# Patient Record
Sex: Female | Born: 1965 | Race: Black or African American | Hispanic: No | Smoking: Light tobacco smoker
Health system: Southern US, Community
[De-identification: ages and names within clinical notes are randomized; demographics above are authoritative.]

## PROBLEM LIST (undated history)

## (undated) ENCOUNTER — Emergency Department: Admission: EM | Payer: No Typology Code available for payment source | Source: Home / Self Care

## (undated) DIAGNOSIS — G8929 Other chronic pain: Secondary | ICD-10-CM

## (undated) DIAGNOSIS — F419 Anxiety disorder, unspecified: Secondary | ICD-10-CM

## (undated) DIAGNOSIS — F32A Depression, unspecified: Secondary | ICD-10-CM

## (undated) DIAGNOSIS — F329 Major depressive disorder, single episode, unspecified: Secondary | ICD-10-CM

## (undated) DIAGNOSIS — M549 Dorsalgia, unspecified: Secondary | ICD-10-CM

## (undated) HISTORY — DX: Depression, unspecified: F32.A

## (undated) HISTORY — DX: Anxiety disorder, unspecified: F41.9

## (undated) HISTORY — DX: Major depressive disorder, single episode, unspecified: F32.9

## (undated) HISTORY — PX: LIPOMA EXCISION: SHX5283

---

## 2007-09-26 ENCOUNTER — Emergency Department: Payer: Self-pay | Admitting: Emergency Medicine

## 2010-02-01 ENCOUNTER — Ambulatory Visit: Payer: Self-pay | Admitting: Family Medicine

## 2010-06-21 ENCOUNTER — Ambulatory Visit: Payer: Self-pay | Admitting: Family Medicine

## 2013-02-08 ENCOUNTER — Ambulatory Visit: Payer: Self-pay

## 2014-03-16 ENCOUNTER — Ambulatory Visit: Payer: Self-pay | Admitting: Obstetrics and Gynecology

## 2014-04-09 ENCOUNTER — Emergency Department: Payer: Self-pay | Admitting: Internal Medicine

## 2014-04-16 ENCOUNTER — Emergency Department: Payer: Self-pay | Admitting: Emergency Medicine

## 2014-05-02 ENCOUNTER — Emergency Department: Payer: Self-pay | Admitting: Student

## 2014-07-19 ENCOUNTER — Ambulatory Visit: Payer: Medicaid Other | Attending: Orthopedic Surgery | Admitting: Physical Therapy

## 2014-07-19 ENCOUNTER — Encounter: Payer: Self-pay | Admitting: Physical Therapy

## 2014-07-19 DIAGNOSIS — M5431 Sciatica, right side: Secondary | ICD-10-CM

## 2014-07-19 DIAGNOSIS — M6281 Muscle weakness (generalized): Secondary | ICD-10-CM
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
## 2014-08-23 ENCOUNTER — Encounter: Payer: Self-pay | Admitting: Emergency Medicine

## 2014-08-23 ENCOUNTER — Emergency Department
Admission: EM | Admit: 2014-08-23 | Discharge: 2014-08-23 | Disposition: A | Discharge status: Home / Self Care | Payer: Medicaid Other | Attending: Emergency Medicine | Admitting: Emergency Medicine

## 2014-08-23 DIAGNOSIS — G8929 Other chronic pain: Secondary | ICD-10-CM

## 2014-08-23 DIAGNOSIS — M79604 Pain in right leg: Secondary | ICD-10-CM | POA: Insufficient documentation

## 2014-08-23 DIAGNOSIS — Z720 Tobacco use: Secondary | ICD-10-CM | POA: Insufficient documentation

## 2014-08-23 DIAGNOSIS — M549 Dorsalgia, unspecified: Secondary | ICD-10-CM | POA: Insufficient documentation

## 2014-08-23 HISTORY — DX: Other chronic pain: G89.29

## 2014-08-23 HISTORY — DX: Dorsalgia, unspecified: M54.9

## 2014-08-23 MED ORDER — ORPHENADRINE CITRATE 30 MG/ML IJ SOLN
INTRAMUSCULAR | Status: AC
  Administered 2014-08-23: 60 mg
  Filled 2014-08-23: qty 2

## 2014-08-23 MED ORDER — HYDROMORPHONE HCL 1 MG/ML IJ SOLN: 1.0000 mg | Freq: Once | INTRAMUSCULAR | Status: AC

## 2014-08-23 MED ORDER — DEXAMETHASONE SODIUM PHOSPHATE 10 MG/ML IJ SOLN: 10.0000 mg | Freq: Once | INTRAMUSCULAR | Status: AC

## 2014-08-23 MED ORDER — HYDROMORPHONE HCL 1 MG/ML IJ SOLN
INTRAMUSCULAR | Status: AC
  Administered 2014-08-23: 1 mg
  Filled 2014-08-23: qty 1

## 2014-08-23 MED ORDER — ORPHENADRINE CITRATE 30 MG/ML IJ SOLN: 60.0000 mg | Freq: Two times a day (BID) | INTRAMUSCULAR | Status: DC

## 2014-08-23 MED ORDER — DEXAMETHASONE SODIUM PHOSPHATE 10 MG/ML IJ SOLN
INTRAMUSCULAR | Status: AC
  Administered 2014-08-23: 10 mg
  Filled 2014-08-23: qty 1

## 2014-08-23 NOTE — ED Notes (Signed)
Pt to ed with c/o back pain and right leg pain.  Pt states she has been seeing todd mundy for help but he is out of town.  Reports next md appt is on July 7.  Pt states she has been on percocet in the past and it helps.  Pt reports his office called in norco on June 16th but it does not help as much with the pain.

## 2014-08-23 NOTE — ED Provider Notes (Signed)
[facility] Emergency Department Provider Note  ____________________________________________  Time seen: Approximately 11:25 AM  I have reviewed the triage vital signs and the nursing notes.   HISTORY  Chief Complaint Back Pain and Leg Pain    HPI DORSIE SETHI is a 49 y.o. female the patient requests pain medication for chronic back pain with radicular component to the right leg. Patient states her PCP is out of town patient and next appointment is 09/01/2014. Patient states she is out of Percocets which helps her pain. Patient states she contacted her PCP office on June 16 and prescribed Norco does not help. Patient is requesting a refill of Percocet as the to last until 09/01/2014.  Patient describes the pain as sharp and running a right leg and rates it as a 9/10. Patient denies any bladder or bowel dysfunction. States the only palliative measure is the Percocets.  Past Medical History  Diagnosis Date  . Chronic back pain     There are no active problems to display for this patient.   History reviewed. No pertinent past surgical history.  Current Outpatient Rx  Name  Route  Sig  Dispense  Refill  . ibuprofen (ADVIL,MOTRIN) 200 MG tablet   Oral   Take 200 mg by mouth every 6 (six) hours as needed.         Marland Kitchen oxyCODONE-acetaminophen (PERCOCET/ROXICET) 5-325 MG per tablet   Oral   Take by mouth every 4 (four) hours as needed for severe pain.           Allergies Penicillins  History reviewed. No pertinent family history.  Social History History  Substance Use Topics  . Smoking status: Light Tobacco Smoker  . Smokeless tobacco: Not on file  . Alcohol Use: No    Review of Systems Constitutional: No fever/chills Eyes: No visual changes. ENT: No sore throat. Cardiovascular: Denies chest pain. Respiratory: Denies shortness of breath. Gastrointestinal: No abdominal pain.  No nausea, no vomiting.  No diarrhea.  No  constipation. Genitourinary: Negative for dysuria. Musculoskeletal: Chronic radicular back pain. Skin: Negative for rash. Neurological: Negative for headaches, focal weakness or numbness. Allergic/Immunilogical: See medication list 10-point ROS otherwise negative.  ____________________________________________   PHYSICAL EXAM:  VITAL SIGNS: ED Triage Vitals  Enc Vitals Group     BP 08/23/14 1100 133/83 mmHg     Pulse Rate 08/23/14 1100 79     Resp 08/23/14 1100 20     Temp 08/23/14 1100 98.2 F (36.8 C)     Temp Source 08/23/14 1100 Oral     SpO2 08/23/14 1100 99 %     Weight 08/23/14 1100 220 lb (99.791 kg)     Height 08/23/14 1100 5\' 6"  (1.676 m)     Head Cir --      Peak Flow --      Pain Score 08/23/14 1107 9     Pain Loc --      Pain Edu? --      Excl. in [place]? --    Constitutional: Alert and oriented. Mild distress. Eyes: Conjunctivae are normal. PERRL. EOMI. Head: Atraumatic. Nose: No congestion/rhinnorhea. Mouth/Throat: Mucous membranes are moist.  Oropharynx non-erythematous. Neck: No stridor. Full nuchal range of motion nontender palpation. Hematological/Lymphatic/Immunilogical: No cervical lymphadenopathy. Cardiovascular: Normal rate, regular rhythm. Grossly normal heart sounds.  Good peripheral circulation. Respiratory: Normal respiratory effort.  No retractions. Lungs CTAB. Gastrointestinal: Soft and nontender. No distention. No abdominal bruits. No CVA tenderness. Musculoskeletal: No spinal deformity patient maintain a  slightly flexed position. Patient relates a hesitant gait. Patient has guarding at L3-S1. Patient sits and stands her last upper extremity. There was a negative straight leg test. Neurologic:  Normal speech and language. No gross focal neurologic deficits are appreciated. Speech is normal. No gait instability. Skin:  Skin is warm, dry and intact. No rash noted. Psychiatric: Mood and affect are normal. Speech and behavior are  normal.  ____________________________________________   LABS (all labs ordered are listed, but only abnormal results are displayed)  Labs Reviewed - No data to display ____________________________________________  EKG   ____________________________________________  RADIOLOGY   ____________________________________________   PROCEDURES  Procedure(s) performed: None  Critical Care performed: No  ____________________________________________   INITIAL IMPRESSION / ASSESSMENT AND PLAN / ED COURSE  Pertinent labs & imaging results that were available during my care of the patient were reviewed by me and considered in my medical decision making (see chart for details).  Chronic back pain. Discussed patient prescribing usage of narcotic medication in this past month. Advised the ER does not prescribe narcotics for chronic back pain. Advised patient we will give her an injection of Dilaudid, Norflex, and Decadron. Advised patient to follow with the clinic treating her for chronic back pain in the absence of her PCP may be another provider can address her needs. ____________________________________________   FINAL CLINICAL IMPRESSION(S) / ED DIAGNOSES  Final diagnoses:  Chronic back pain greater than 3 months duration      Sable Feil, PA-C 08/23/14 1150  Ahmed Prima, MD 08/23/14 [phone]

## 2014-08-23 NOTE — ED Notes (Signed)
C/o chronic right hip pain down right leg

## 2014-10-07 DIAGNOSIS — M5442 Lumbago with sciatica, left side: Secondary | ICD-10-CM | POA: Insufficient documentation

## 2014-10-07 DIAGNOSIS — M5441 Lumbago with sciatica, right side: Secondary | ICD-10-CM | POA: Insufficient documentation

## 2015-01-17 ENCOUNTER — Encounter: Payer: Self-pay | Admitting: Emergency Medicine

## 2015-01-17 ENCOUNTER — Emergency Department
Admission: EM | Admit: 2015-01-17 | Discharge: 2015-01-17 | Disposition: A | Discharge status: Home / Self Care | Payer: Medicaid Other | Attending: Emergency Medicine | Admitting: Emergency Medicine

## 2015-01-17 DIAGNOSIS — M5441 Lumbago with sciatica, right side: Secondary | ICD-10-CM | POA: Diagnosis not present

## 2015-01-17 DIAGNOSIS — F17200 Nicotine dependence, unspecified, uncomplicated: Secondary | ICD-10-CM | POA: Insufficient documentation

## 2015-01-17 DIAGNOSIS — Z880 Allergy status to penicillin: Secondary | ICD-10-CM | POA: Insufficient documentation

## 2015-01-17 DIAGNOSIS — G8929 Other chronic pain: Secondary | ICD-10-CM | POA: Diagnosis not present

## 2015-01-17 DIAGNOSIS — Z79899 Other long term (current) drug therapy: Secondary | ICD-10-CM | POA: Diagnosis not present

## 2015-01-17 DIAGNOSIS — M545 Low back pain: Secondary | ICD-10-CM | POA: Diagnosis present

## 2015-01-17 DIAGNOSIS — M5431 Sciatica, right side: Secondary | ICD-10-CM

## 2015-01-17 MED ORDER — PREDNISONE 10 MG (21) PO TBPK: ORAL_TABLET | ORAL | Status: DC

## 2015-01-17 MED ORDER — HYDROCODONE-ACETAMINOPHEN 5-325 MG PO TABS
1.0000 | ORAL_TABLET | ORAL | Status: DC | PRN
Start: 2015-01-17 — End: 2016-06-11

## 2015-01-17 NOTE — ED Provider Notes (Signed)
[facility] Emergency Department Provider Note ____________________________________________  Time seen: Approximately 10:24 AM  I have reviewed the triage vital signs and the nursing notes.   HISTORY  Chief Complaint Back Pain   HPI Hannah Maynard is a 49 y.o. female who presents to the emergency department for evaluation of chronic back pain. She states that she's had back pain with sciatica for a long time. She is currently being seen at the pain clinic and they have her taking Lyrica after the gabapentin did not work. She states that the Lyrica does not seem to be working either. She states that now she feels like her "skin hurts." She states that the pain is typical with the exception of the change in the feeling of her skin on the right lateral leg. She denies new injury.   Past Medical History  Diagnosis Date  . Chronic back pain     There are no active problems to display for this patient.   History reviewed. No pertinent past surgical history.  Current Outpatient Rx  Name  Route  Sig  Dispense  Refill  . pregabalin (LYRICA) 75 MG capsule   Oral   Take 75 mg by mouth 2 (two) times daily.         Marland Kitchen HYDROcodone-acetaminophen (NORCO/VICODIN) 5-325 MG tablet   Oral   Take 1 tablet by mouth every 4 (four) hours as needed.   9 tablet   0   . ibuprofen (ADVIL,MOTRIN) 200 MG tablet   Oral   Take 200 mg by mouth every 6 (six) hours as needed.         Marland Kitchen oxyCODONE-acetaminophen (PERCOCET/ROXICET) 5-325 MG per tablet   Oral   Take by mouth every 4 (four) hours as needed for severe pain.         . predniSONE (STERAPRED UNI-PAK 21 TAB) 10 MG (21) TBPK tablet      Take 6 tablets on day 1 Take 5 tablets on day 2 Take 4 tablets on day 3 Take 3 tablets on day 4 Take 2 tablets on day 5 Take 1 tablet on day 6   21 tablet   0     Allergies Penicillins  History reviewed. No pertinent family history.  Social History Social History   Substance Use Topics  . Smoking status: Light Tobacco Smoker  . Smokeless tobacco: None  . Alcohol Use: No    Review of Systems Constitutional: No recent illness. No recent injury Eyes: No visual changes. ENT: No sore throat. Cardiovascular: Denies chest pain or palpitations. Respiratory: Denies shortness of breath. Gastrointestinal: No abdominal pain.  Genitourinary: Negative for dysuria. Musculoskeletal: Pain in Right lower back with radiation into right lateral leg to knee Skin: Negative for rash. Neurological: Negative for headaches, focal weakness or numbness. 10-point ROS otherwise negative.  ____________________________________________   PHYSICAL EXAM:  VITAL SIGNS: ED Triage Vitals  Enc Vitals Group     BP 01/17/15 0951 111/94 mmHg     Pulse Rate 01/17/15 0951 79     Resp 01/17/15 0951 18     Temp 01/17/15 0951 98.6 F (37 C)     Temp Source 01/17/15 0951 Oral     SpO2 01/17/15 0951 97 %     Weight 01/17/15 0951 223 lb (101.152 kg)     Height 01/17/15 0951 5\' 6"  (1.676 m)     Head Cir --      Peak Flow --      Pain Score 01/17/15  0936 8     Pain Loc --      Pain Edu? --      Excl. in [place]? --     Constitutional: Alert and oriented. Uncomfortable appearing and in no acute distress. Eyes: Conjunctivae are normal. EOMI. Head: Atraumatic. Nose: No congestion/rhinnorhea. Neck: No stridor.  Respiratory: Normal respiratory effort.   Musculoskeletal: Active straight leg raise possible with pain around 45 degrees.  Neurologic:  Normal speech and language. No gross focal neurologic deficits are appreciated. Speech is normal. No gait instability. Skin:  Skin is warm, dry and intact. Atraumatic. Psychiatric: Mood and affect are normal. Speech and behavior are normal.  ____________________________________________   LABS (all labs ordered are listed, but only abnormal results are displayed)  Labs Reviewed - No data to  display ____________________________________________  RADIOLOGY  Not indicated ____________________________________________   PROCEDURES  Procedure(s) performed: None   ____________________________________________   INITIAL IMPRESSION / ASSESSMENT AND PLAN / ED COURSE  Pertinent labs & imaging results that were available during my care of the patient were reviewed by me and considered in my medical decision making (see chart for details).  Patient was advised that prior to filling any of the prescriptions written today, she will need to contact pain management. She was given a copy of the chronic pain management policy for the emergency department. She was advised to schedule an appointment to have another epidural injection. She was advised to return to the emergency department for symptoms that change or worsen if she is unable to schedule an appointment. ____________________________________________   FINAL CLINICAL IMPRESSION(S) / ED DIAGNOSES  Final diagnoses:  Sciatica of right side       Victorino Dike, FNP 01/17/15 1431  Lavonia Drafts, MD 01/17/15 1525

## 2015-01-17 NOTE — ED Notes (Signed)
Pt to ed with c/o lower back pain that radiates down right leg.  Pt states she is a pt at pain center but was told to come here.

## 2015-01-17 NOTE — ED Notes (Signed)
States she has right side back   Hx of sciatica  Has having more pain w/o new injury

## 2015-09-24 DIAGNOSIS — M47816 Spondylosis without myelopathy or radiculopathy, lumbar region: Secondary | ICD-10-CM | POA: Insufficient documentation

## 2015-10-26 ENCOUNTER — Emergency Department: Payer: No Typology Code available for payment source

## 2015-10-26 ENCOUNTER — Encounter: Payer: Self-pay | Admitting: *Deleted

## 2015-10-26 ENCOUNTER — Emergency Department
Admission: EM | Admit: 2015-10-26 | Discharge: 2015-10-26 | Disposition: A | Discharge status: Home / Self Care | Payer: No Typology Code available for payment source | Attending: Emergency Medicine | Admitting: Emergency Medicine

## 2015-10-26 DIAGNOSIS — Y9389 Activity, other specified: Secondary | ICD-10-CM | POA: Insufficient documentation

## 2015-10-26 DIAGNOSIS — M545 Low back pain, unspecified: Secondary | ICD-10-CM

## 2015-10-26 DIAGNOSIS — Y999 Unspecified external cause status: Secondary | ICD-10-CM | POA: Insufficient documentation

## 2015-10-26 DIAGNOSIS — F17200 Nicotine dependence, unspecified, uncomplicated: Secondary | ICD-10-CM | POA: Insufficient documentation

## 2015-10-26 DIAGNOSIS — Y92410 Unspecified street and highway as the place of occurrence of the external cause: Secondary | ICD-10-CM | POA: Insufficient documentation

## 2015-10-26 MED ORDER — METHOCARBAMOL 500 MG PO TABS: 500.0000 mg | ORAL_TABLET | Freq: Four times a day (QID) | ORAL | 0 refills | Status: DC

## 2015-10-26 MED ORDER — OXYCODONE-ACETAMINOPHEN 5-325 MG PO TABS
1.0000 | ORAL_TABLET | Freq: Once | ORAL | Status: AC
  Administered 2015-10-26: 1 via ORAL
  Filled 2015-10-26: qty 1

## 2015-10-26 MED ORDER — DIAZEPAM 5 MG PO TABS
5.0000 mg | ORAL_TABLET | Freq: Once | ORAL | Status: AC
  Administered 2015-10-26: 5 mg via ORAL
  Filled 2015-10-26: qty 1

## 2015-10-26 MED ORDER — MELOXICAM 15 MG PO TABS: 15.0000 mg | ORAL_TABLET | Freq: Every day | ORAL | 0 refills | Status: DC
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
## 2015-12-26 ENCOUNTER — Other Ambulatory Visit: Payer: Self-pay | Admitting: Orthopedic Surgery

## 2015-12-26 DIAGNOSIS — M5440 Lumbago with sciatica, unspecified side: Principal | ICD-10-CM

## 2015-12-26 DIAGNOSIS — M545 Low back pain, unspecified: Secondary | ICD-10-CM

## 2016-01-03 ENCOUNTER — Other Ambulatory Visit: Payer: Self-pay | Admitting: Neurological Surgery

## 2016-01-03 DIAGNOSIS — G8929 Other chronic pain: Secondary | ICD-10-CM

## 2016-01-03 DIAGNOSIS — M5441 Lumbago with sciatica, right side: Principal | ICD-10-CM

## 2016-01-03 DIAGNOSIS — M5442 Lumbago with sciatica, left side: Principal | ICD-10-CM

## 2016-01-15 ENCOUNTER — Encounter: Payer: Self-pay | Admitting: Radiology

## 2016-01-15 ENCOUNTER — Ambulatory Visit
Admission: RE | Admit: 2016-01-15 | Discharge: 2016-01-15 | Disposition: A | Discharge status: Home / Self Care | Payer: Medicaid Other | Source: Ambulatory Visit | Attending: Neurological Surgery | Admitting: Neurological Surgery

## 2016-01-15 DIAGNOSIS — G8929 Other chronic pain: Secondary | ICD-10-CM

## 2016-01-15 DIAGNOSIS — M5440 Lumbago with sciatica, unspecified side: Secondary | ICD-10-CM | POA: Insufficient documentation

## 2016-01-15 DIAGNOSIS — M5442 Lumbago with sciatica, left side: Secondary | ICD-10-CM

## 2016-01-15 DIAGNOSIS — M5441 Lumbago with sciatica, right side: Secondary | ICD-10-CM

## 2016-01-15 DIAGNOSIS — M5127 Other intervertebral disc displacement, lumbosacral region: Secondary | ICD-10-CM | POA: Insufficient documentation

## 2016-01-15 DIAGNOSIS — M48061 Spinal stenosis, lumbar region without neurogenic claudication: Secondary | ICD-10-CM | POA: Insufficient documentation

## 2016-01-16 ENCOUNTER — Ambulatory Visit: Payer: No Typology Code available for payment source

## 2016-02-08 ENCOUNTER — Ambulatory Visit: Payer: Medicaid Other | Admitting: Anesthesiology

## 2016-02-27 ENCOUNTER — Encounter: Payer: Self-pay | Admitting: Anesthesiology

## 2016-02-27 ENCOUNTER — Ambulatory Visit: Payer: Medicaid Other | Attending: Anesthesiology | Admitting: Anesthesiology

## 2016-02-27 VITALS — BP 144/94 | HR 87 | Temp 98.0°F | Resp 18 | Ht 65.0 in | Wt 235.0 lb

## 2016-02-27 DIAGNOSIS — M79671 Pain in right foot: Secondary | ICD-10-CM | POA: Diagnosis not present

## 2016-02-27 DIAGNOSIS — M5386 Other specified dorsopathies, lumbar region: Secondary | ICD-10-CM

## 2016-02-27 DIAGNOSIS — M539 Dorsopathy, unspecified: Secondary | ICD-10-CM

## 2016-02-27 DIAGNOSIS — M25551 Pain in right hip: Secondary | ICD-10-CM | POA: Insufficient documentation

## 2016-02-27 DIAGNOSIS — Z79899 Other long term (current) drug therapy: Secondary | ICD-10-CM | POA: Insufficient documentation

## 2016-02-27 DIAGNOSIS — M4696 Unspecified inflammatory spondylopathy, lumbar region: Secondary | ICD-10-CM | POA: Diagnosis not present

## 2016-02-27 DIAGNOSIS — M5136 Other intervertebral disc degeneration, lumbar region: Secondary | ICD-10-CM | POA: Insufficient documentation

## 2016-02-27 DIAGNOSIS — M48061 Spinal stenosis, lumbar region without neurogenic claudication: Secondary | ICD-10-CM | POA: Insufficient documentation

## 2016-02-27 DIAGNOSIS — F17200 Nicotine dependence, unspecified, uncomplicated: Secondary | ICD-10-CM | POA: Insufficient documentation

## 2016-02-27 DIAGNOSIS — F419 Anxiety disorder, unspecified: Secondary | ICD-10-CM | POA: Insufficient documentation

## 2016-02-27 DIAGNOSIS — F329 Major depressive disorder, single episode, unspecified: Secondary | ICD-10-CM | POA: Insufficient documentation

## 2016-02-27 DIAGNOSIS — R200 Anesthesia of skin: Secondary | ICD-10-CM | POA: Diagnosis not present

## 2016-02-27 DIAGNOSIS — M5441 Lumbago with sciatica, right side: Secondary | ICD-10-CM | POA: Diagnosis not present

## 2016-02-27 DIAGNOSIS — G8929 Other chronic pain: Secondary | ICD-10-CM | POA: Diagnosis not present

## 2016-02-27 DIAGNOSIS — M47816 Spondylosis without myelopathy or radiculopathy, lumbar region: Secondary | ICD-10-CM

## 2016-02-27 DIAGNOSIS — M51369 Other intervertebral disc degeneration, lumbar region without mention of lumbar back pain or lower extremity pain: Secondary | ICD-10-CM

## 2016-02-27 MED ORDER — CYCLOBENZAPRINE HCL 10 MG PO TABS: 10.0000 mg | ORAL_TABLET | Freq: Two times a day (BID) | ORAL | 1 refills | Status: DC

## 2016-02-27 MED ORDER — PREGABALIN 75 MG PO CAPS: 75.0000 mg | ORAL_CAPSULE | Freq: Two times a day (BID) | ORAL | 1 refills | Status: DC

## 2016-02-27 NOTE — Patient Instructions (Signed)
GENERAL RISKS AND COMPLICATIONS  What are the risk, side effects and possible complications? Generally speaking, most procedures are safe.  However, with any procedure there are risks, side effects, and the possibility of complications.  The risks and complications are dependent upon the sites that are lesioned, or the type of nerve block to be performed.  The closer the procedure is to the spine, the more serious the risks are.  Great care is taken when placing the radio frequency needles, block needles or lesioning probes, but sometimes complications can occur. 1. Infection: Any time there is an injection through the skin, there is a risk of infection.  This is why sterile conditions are used for these blocks.  There are four possible types of infection. 1. Localized skin infection. 2. Central Nervous System Infection-This can be in the form of Meningitis, which can be deadly. 3. Epidural Infections-This can be in the form of an epidural abscess, which can cause pressure inside of the spine, causing compression of the spinal cord with subsequent paralysis. This would require an emergency surgery to decompress, and there are no guarantees that the patient would recover from the paralysis. 4. Discitis-This is an infection of the intervertebral discs.  It occurs in about 1% of discography procedures.  It is difficult to treat and it may lead to surgery.        2. Pain: the needles have to go through skin and soft tissues, will cause soreness.       3. Damage to internal structures:  The nerves to be lesioned may be near blood vessels or    other nerves which can be potentially damaged.       4. Bleeding: Bleeding is more common if the patient is taking blood thinners such as  aspirin, Coumadin, Ticiid, Plavix, etc., or if he/she have some genetic predisposition  such as hemophilia. Bleeding into the spinal canal can cause compression of the spinal  cord with subsequent paralysis.  This would require an  emergency surgery to  decompress and there are no guarantees that the patient would recover from the  paralysis.       5. Pneumothorax:  Puncturing of a lung is a possibility, every time a needle is introduced in  the area of the chest or upper back.  Pneumothorax refers to free air around the  collapsed lung(s), inside of the thoracic cavity (chest cavity).  Another two possible  complications related to a similar event would include: Hemothorax and Chylothorax.   These are variations of the Pneumothorax, where instead of air around the collapsed  lung(s), you may have blood or chyle, respectively.       6. Spinal headaches: They may occur with any procedures in the area of the spine.       7. Persistent CSF (Cerebro-Spinal Fluid) leakage: This is a rare problem, but may occur  with prolonged intrathecal or epidural catheters either due to the formation of a fistulous  track or a dural tear.       8. Nerve damage: By working so close to the spinal cord, there is always a possibility of  nerve damage, which could be as serious as a permanent spinal cord injury with  paralysis.       9. Death:  Although rare, severe deadly allergic reactions known as "Anaphylactic  reaction" can occur to any of the medications used.      10. Worsening of the symptoms:  We can always make thing worse.    What are the chances of something like this happening? Chances of any of this occuring are extremely low.  By statistics, you have more of a chance of getting killed in a motor vehicle accident: while driving to the hospital than any of the above occurring .  Nevertheless, you should be aware that they are possibilities.  In general, it is similar to taking a shower.  Everybody knows that you can slip, hit your head and get killed.  Does that mean that you should not shower again?  Nevertheless always keep in mind that statistics do not mean anything if you happen to be on the wrong side of them.  Even if a procedure has a 1  (one) in a 1,000,000 (million) chance of going wrong, it you happen to be that one..Also, keep in mind that by statistics, you have more of a chance of having something go wrong when taking medications.  Who should not have this procedure? If you are on a blood thinning medication (e.g. Coumadin, Plavix, see list of "Blood Thinners"), or if you have an active infection going on, you should not have the procedure.  If you are taking any blood thinners, please inform your physician.  How should I prepare for this procedure?  Do not eat or drink anything at least six hours prior to the procedure.  Bring a driver with you .  It cannot be a taxi.  Come accompanied by an adult that can drive you back, and that is strong enough to help you if your legs get weak or numb from the local anesthetic.  Take all of your medicines the morning of the procedure with just enough water to swallow them.  If you have diabetes, make sure that you are scheduled to have your procedure done first thing in the morning, whenever possible.  If you have diabetes, take only half of your insulin dose and notify our nurse that you have done so as soon as you arrive at the clinic.  If you are diabetic, but only take blood sugar pills (oral hypoglycemic), then do not take them on the morning of your procedure.  You may take them after you have had the procedure.  Do not take aspirin or any aspirin-containing medications, at least eleven (11) days prior to the procedure.  They may prolong bleeding.  Wear loose fitting clothing that may be easy to take off and that you would not mind if it got stained with Betadine or blood.  Do not wear any jewelry or perfume  Remove any nail coloring.  It will interfere with some of our monitoring equipment.  NOTE: Remember that this is not meant to be interpreted as a complete list of all possible complications.  Unforeseen problems may occur.  BLOOD THINNERS The following drugs  contain aspirin or other products, which can cause increased bleeding during surgery and should not be taken for 2 weeks prior to and 1 week after surgery.  If you should need take something for relief of minor pain, you may take acetaminophen which is found in Tylenol,m Datril, Anacin-3 and Panadol. It is not blood thinner. The products listed below are.  Do not take any of the products listed below in addition to any listed on your instruction sheet.  A.P.C or A.P.C with Codeine Codeine Phosphate Capsules #3 Ibuprofen Ridaura  ABC compound Congesprin Imuran rimadil  Advil Cope Indocin Robaxisal  Alka-Seltzer Effervescent Pain Reliever and Antacid Coricidin or Coricidin-D  Indomethacin Rufen    Alka-Seltzer plus Cold Medicine Cosprin Ketoprofen S-A-C Tablets  Anacin Analgesic Tablets or Capsules Coumadin Korlgesic Salflex  Anacin Extra Strength Analgesic tablets or capsules CP-2 Tablets Lanoril Salicylate  Anaprox Cuprimine Capsules Levenox Salocol  Anexsia-D Dalteparin Magan Salsalate  Anodynos Darvon compound Magnesium Salicylate Sine-off  Ansaid Dasin Capsules Magsal Sodium Salicylate  Anturane Depen Capsules Marnal Soma  APF Arthritis pain formula Dewitt's Pills Measurin Stanback  Argesic Dia-Gesic Meclofenamic Sulfinpyrazone  Arthritis Bayer Timed Release Aspirin Diclofenac Meclomen Sulindac  Arthritis pain formula Anacin Dicumarol Medipren Supac  Analgesic (Safety coated) Arthralgen Diffunasal Mefanamic Suprofen  Arthritis Strength Bufferin Dihydrocodeine Mepro Compound Suprol  Arthropan liquid Dopirydamole Methcarbomol with Aspirin Synalgos  ASA tablets/Enseals Disalcid Micrainin Tagament  Ascriptin Doan's Midol Talwin  Ascriptin A/D Dolene Mobidin Tanderil  Ascriptin Extra Strength Dolobid Moblgesic Ticlid  Ascriptin with Codeine Doloprin or Doloprin with Codeine Momentum Tolectin  Asperbuf Duoprin Mono-gesic Trendar  Aspergum Duradyne Motrin or Motrin IB Triminicin  Aspirin  plain, buffered or enteric coated Durasal Myochrisine Trigesic  Aspirin Suppositories Easprin Nalfon Trillsate  Aspirin with Codeine Ecotrin Regular or Extra Strength Naprosyn Uracel  Atromid-S Efficin Naproxen Ursinus  Auranofin Capsules Elmiron Neocylate Vanquish  Axotal Emagrin Norgesic Verin  Azathioprine Empirin or Empirin with Codeine Normiflo Vitamin E  Azolid Emprazil Nuprin Voltaren  Bayer Aspirin plain, buffered or children's or timed BC Tablets or powders Encaprin Orgaran Warfarin Sodium  Buff-a-Comp Enoxaparin Orudis Zorpin  Buff-a-Comp with Codeine Equegesic Os-Cal-Gesic   Buffaprin Excedrin plain, buffered or Extra Strength Oxalid   Bufferin Arthritis Strength Feldene Oxphenbutazone   Bufferin plain or Extra Strength Feldene Capsules Oxycodone with Aspirin   Bufferin with Codeine Fenoprofen Fenoprofen Pabalate or Pabalate-SF   Buffets II Flogesic Panagesic   Buffinol plain or Extra Strength Florinal or Florinal with Codeine Panwarfarin   Buf-Tabs Flurbiprofen Penicillamine   Butalbital Compound Four-way cold tablets Penicillin   Butazolidin Fragmin Pepto-Bismol   Carbenicillin Geminisyn Percodan   Carna Arthritis Reliever Geopen Persantine   Carprofen Gold's salt Persistin   Chloramphenicol Goody's Phenylbutazone   Chloromycetin Haltrain Piroxlcam   Clmetidine heparin Plaquenil   Cllnoril Hyco-pap Ponstel   Clofibrate Hydroxy chloroquine Propoxyphen         Before stopping any of these medications, be sure to consult the physician who ordered them.  Some, such as Coumadin (Warfarin) are ordered to prevent or treat serious conditions such as "deep thrombosis", "pumonary embolisms", and other heart problems.  The amount of time that you may need off of the medication may also vary with the medication and the reason for which you were taking it.  If you are taking any of these medications, please make sure you notify your pain physician before you undergo any  procedures.         Epidural Steroid Injection Patient Information  Description: The epidural space surrounds the nerves as they exit the spinal cord.  In some patients, the nerves can be compressed and inflamed by a bulging disc or a tight spinal canal (spinal stenosis).  By injecting steroids into the epidural space, we can bring irritated nerves into direct contact with a potentially helpful medication.  These steroids act directly on the irritated nerves and can reduce swelling and inflammation which often leads to decreased pain.  Epidural steroids may be injected anywhere along the spine and from the neck to the low back depending upon the location of your pain.   After numbing the skin with local anesthetic (like Novocaine), a small needle is passed   into the epidural space slowly.  You may experience a sensation of pressure while this is being done.  The entire block usually last less than 10 minutes.  Conditions which may be treated by epidural steroids:   Low back and leg pain  Neck and arm pain  Spinal stenosis  Post-laminectomy syndrome  Herpes zoster (shingles) pain  Pain from compression fractures  Preparation for the injection:  1. Do not eat any solid food or dairy products within 8 hours of your appointment.  2. You may drink clear liquids up to 3 hours before appointment.  Clear liquids include water, black coffee, juice or soda.  No milk or cream please. 3. You may take your regular medication, including pain medications, with a sip of water before your appointment  Diabetics should hold regular insulin (if taken separately) and take 1/2 normal NPH dos the morning of the procedure.  Carry some sugar containing items with you to your appointment. 4. A driver must accompany you and be prepared to drive you home after your procedure.  5. Bring all your current medications with your. 6. An IV may be inserted and sedation may be given at the discretion of the  physician.   7. A blood pressure cuff, EKG and other monitors will often be applied during the procedure.  Some patients may need to have extra oxygen administered for a short period. 8. You will be asked to provide medical information, including your allergies, prior to the procedure.  We must know immediately if you are taking blood thinners (like Coumadin/Warfarin)  Or if you are allergic to IV iodine contrast (dye). We must know if you could possible be pregnant.  Possible side-effects:  Bleeding from needle site  Infection (rare, may require surgery)  Nerve injury (rare)  Numbness & tingling (temporary)  Difficulty urinating (rare, temporary)  Spinal headache ( a headache worse with upright posture)  Light -headedness (temporary)  Pain at injection site (several days)  Decreased blood pressure (temporary)  Weakness in arm/leg (temporary)  Pressure sensation in back/neck (temporary)  Call if you experience:  Fever/chills associated with headache or increased back/neck pain.  Headache worsened by an upright position.  New onset weakness or numbness of an extremity below the injection site  Hives or difficulty breathing (go to the emergency room)  Inflammation or drainage at the infection site  Severe back/neck pain  Any new symptoms which are concerning to you  Please note:  Although the local anesthetic injected can often make your back or neck feel good for several hours after the injection, the pain will likely return.  It takes 3-7 days for steroids to work in the epidural space.  You may not notice any pain relief for at least that one week.  If effective, we will often do a series of three injections spaced 3-6 weeks apart to maximally decrease your pain.  After the initial series, we generally will wait several months before considering a repeat injection of the same type.  If you have any questions, please call [phone] [facility] 

## 2016-02-27 NOTE — Progress Notes (Signed)
Takes a medication for anxiety and depression, does not know the name of the medication.

## 2016-02-27 NOTE — Progress Notes (Signed)
Safety precautions to be maintained throughout the outpatient stay will include: orient to surroundings, keep bed in low position, maintain call bell within reach at all times, provide assistance with transfer out of bed and ambulation.  

## 2016-02-28 DIAGNOSIS — E669 Obesity, unspecified: Secondary | ICD-10-CM | POA: Insufficient documentation

## 2016-02-28 DIAGNOSIS — Z720 Tobacco use: Secondary | ICD-10-CM | POA: Insufficient documentation
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
## 2016-03-05 ENCOUNTER — Telehealth: Payer: Self-pay | Admitting: Pain Medicine

## 2016-03-05 ENCOUNTER — Other Ambulatory Visit: Payer: Self-pay | Admitting: *Deleted

## 2016-03-05 ENCOUNTER — Telehealth: Payer: Self-pay

## 2016-03-05 DIAGNOSIS — G894 Chronic pain syndrome: Secondary | ICD-10-CM

## 2016-03-05 NOTE — Telephone Encounter (Signed)
Pt first time visit was on 02/27/16 pt was suppose to do a UDS but she could not pee pt wants to know when can she come in to do her UDS

## 2016-03-05 NOTE — Telephone Encounter (Signed)
Charles from [facility] [phone] called requesting prior auth for patient script of Lyrica, sent faxed request to nurses station 10:15

## 2016-03-05 NOTE — Telephone Encounter (Signed)
Spoke with patient earlier and told her that she could come anytime during office hours.

## 2016-03-05 NOTE — Telephone Encounter (Signed)
Patient came and gave urine specimen 03/05/16

## 2016-03-11 LAB — TOXASSURE SELECT 13 (MW), URINE

## 2016-04-02 ENCOUNTER — Ambulatory Visit
Admission: RE | Admit: 2016-04-02 | Discharge: 2016-04-02 | Disposition: A | Discharge status: Home / Self Care | Payer: Medicaid Other | Source: Ambulatory Visit | Attending: Anesthesiology | Admitting: Anesthesiology

## 2016-04-02 ENCOUNTER — Encounter: Payer: Self-pay | Admitting: Anesthesiology

## 2016-04-02 ENCOUNTER — Other Ambulatory Visit: Payer: Self-pay | Admitting: Anesthesiology

## 2016-04-02 ENCOUNTER — Ambulatory Visit (HOSPITAL_BASED_OUTPATIENT_CLINIC_OR_DEPARTMENT_OTHER): Payer: Medicaid Other | Admitting: Anesthesiology

## 2016-04-02 VITALS — BP 148/87 | HR 78 | Temp 96.6°F | Resp 16 | Ht 65.0 in | Wt 235.0 lb

## 2016-04-02 DIAGNOSIS — G8929 Other chronic pain: Secondary | ICD-10-CM | POA: Insufficient documentation

## 2016-04-02 DIAGNOSIS — F419 Anxiety disorder, unspecified: Secondary | ICD-10-CM | POA: Insufficient documentation

## 2016-04-02 DIAGNOSIS — M5386 Other specified dorsopathies, lumbar region: Secondary | ICD-10-CM

## 2016-04-02 DIAGNOSIS — F17200 Nicotine dependence, unspecified, uncomplicated: Secondary | ICD-10-CM | POA: Diagnosis not present

## 2016-04-02 DIAGNOSIS — M5136 Other intervertebral disc degeneration, lumbar region: Secondary | ICD-10-CM | POA: Insufficient documentation

## 2016-04-02 DIAGNOSIS — M79672 Pain in left foot: Secondary | ICD-10-CM | POA: Insufficient documentation

## 2016-04-02 DIAGNOSIS — R52 Pain, unspecified: Secondary | ICD-10-CM

## 2016-04-02 DIAGNOSIS — M5442 Lumbago with sciatica, left side: Secondary | ICD-10-CM | POA: Insufficient documentation

## 2016-04-02 DIAGNOSIS — F329 Major depressive disorder, single episode, unspecified: Secondary | ICD-10-CM | POA: Insufficient documentation

## 2016-04-02 DIAGNOSIS — M79671 Pain in right foot: Secondary | ICD-10-CM | POA: Diagnosis not present

## 2016-04-02 DIAGNOSIS — R200 Anesthesia of skin: Secondary | ICD-10-CM | POA: Diagnosis not present

## 2016-04-02 DIAGNOSIS — M539 Dorsopathy, unspecified: Secondary | ICD-10-CM

## 2016-04-02 DIAGNOSIS — M48061 Spinal stenosis, lumbar region without neurogenic claudication: Secondary | ICD-10-CM | POA: Diagnosis not present

## 2016-04-02 DIAGNOSIS — M79604 Pain in right leg: Secondary | ICD-10-CM | POA: Diagnosis not present

## 2016-04-02 DIAGNOSIS — Z79899 Other long term (current) drug therapy: Secondary | ICD-10-CM | POA: Diagnosis not present

## 2016-04-02 MED ORDER — LIDOCAINE HCL (PF) 1 % IJ SOLN: 5.0000 mL | Freq: Once | INTRAMUSCULAR | Status: AC

## 2016-04-02 MED ORDER — LACTATED RINGERS IV SOLN: 1000.0000 mL | INTRAVENOUS | Status: DC

## 2016-04-02 MED ORDER — LIDOCAINE HCL (PF) 1 % IJ SOLN
INTRAMUSCULAR | Status: AC
  Administered 2016-04-02: 15:00:00
  Filled 2016-04-02: qty 5

## 2016-04-02 MED ORDER — OXYCODONE HCL 5 MG PO TABS: 5.0000 mg | ORAL_TABLET | Freq: Three times a day (TID) | ORAL | 0 refills | Status: DC

## 2016-04-02 MED ORDER — IOPAMIDOL (ISOVUE-M 200) INJECTION 41%
INTRAMUSCULAR | Status: AC
  Administered 2016-04-02: 15:00:00
  Filled 2016-04-02: qty 10

## 2016-04-02 MED ORDER — SODIUM CHLORIDE 0.9 % IJ SOLN
INTRAMUSCULAR | Status: AC
  Administered 2016-04-02: 15:00:00
  Filled 2016-04-02: qty 10

## 2016-04-02 MED ORDER — MIDAZOLAM HCL 5 MG/5ML IJ SOLN
INTRAMUSCULAR | Status: AC
  Administered 2016-04-02: 3 mg via INTRAVENOUS
  Filled 2016-04-02: qty 5

## 2016-04-02 MED ORDER — TRIAMCINOLONE ACETONIDE 40 MG/ML IJ SUSP
INTRAMUSCULAR | Status: AC
  Administered 2016-04-02: 15:00:00
  Filled 2016-04-02: qty 1

## 2016-04-02 MED ORDER — SODIUM CHLORIDE 0.9% FLUSH
10.0000 mL | Freq: Once | INTRAVENOUS | Status: DC
Start: 2016-04-02 — End: 2017-06-19

## 2016-04-02 MED ORDER — MIDAZOLAM HCL 2 MG/2ML IJ SOLN
5.0000 mg | Freq: Once | INTRAMUSCULAR | Status: DC
  Filled 2016-04-02: qty 5

## 2016-04-02 MED ORDER — ROPIVACAINE HCL 2 MG/ML IJ SOLN
INTRAMUSCULAR | Status: AC
  Administered 2016-04-02: 15:00:00
  Filled 2016-04-02: qty 10

## 2016-04-02 MED ORDER — ROPIVACAINE HCL 2 MG/ML IJ SOLN: 10.0000 mL | Freq: Once | INTRAMUSCULAR | Status: AC

## 2016-04-02 MED ORDER — IOPAMIDOL (ISOVUE-M 200) INJECTION 41%: 20.0000 mL | Freq: Once | INTRAMUSCULAR | Status: DC | PRN

## 2016-04-02 MED ORDER — TRIAMCINOLONE ACETONIDE 40 MG/ML IJ SUSP: 40.0000 mg | Freq: Once | INTRAMUSCULAR | Status: DC

## 2016-04-02 NOTE — Progress Notes (Signed)
Safety precautions to be maintained throughout the outpatient stay will include: orient to surroundings, keep bed in low position, maintain call bell within reach at all times, provide assistance with transfer out of bed and ambulation.  

## 2016-04-02 NOTE — Patient Instructions (Signed)
Pain Management Discharge Instructions  General Discharge Instructions :  If you need to reach your doctor call: Monday-Friday 8:00 am - 4:00 pm at [phone] or toll free [phone].  After clinic hours [phone] to have operator reach doctor.  Bring all of your medication bottles to all your appointments in the pain clinic.  To cancel or reschedule your appointment with Pain Management please remember to call 24 hours in advance to avoid a fee.  Refer to the educational materials which you have been given on: General Risks, I had my Procedure. Discharge Instructions, Post Sedation.  Post Procedure Instructions:  The drugs you were given will stay in your system until tomorrow, so for the next 24 hours you should not drive, make any legal decisions or drink any alcoholic beverages.  You may eat anything you prefer, but it is better to start with liquids then soups and crackers, and gradually work up to solid foods.  Please notify your doctor immediately if you have any unusual bleeding, trouble breathing or pain that is not related to your normal pain.  Depending on the type of procedure that was done, some parts of your body may feel week and/or numb.  This usually clears up by tonight or the next day.  Walk with the use of an assistive device or accompanied by an adult for the 24 hours.  You may use ice on the affected area for the first 24 hours.  Put ice in a Ziploc bag and cover with a towel and place against area 15 minutes on 15 minutes off.  You may switch to heat after 24 hours.Epidural Steroid Injection An epidural steroid injection is a shot of steroid medicine and numbing medicine that is given into the space between the spinal cord and the bones in your back (epidural space). The shot helps relieve pain caused by an irritated or swollen nerve root. The amount of pain relief you get from the injection depends on what is causing the nerve to be swollen and irritated,  and how long your pain lasts. You are more likely to benefit from this injection if your pain is strong and comes on suddenly rather than if you have had pain for a long time. Tell a health care provider about:  Any allergies you have.  All medicines you are taking, including vitamins, herbs, eye drops, creams, and over-the-counter medicines.  Any problems you or family members have had with anesthetic medicines.  Any blood disorders you have.  Any surgeries you have had.  Any medical conditions you have.  Whether you are pregnant or may be pregnant. What are the risks? Generally, this is a safe procedure. However, problems may occur, including:  Headache.  Bleeding.  Infection.  Allergic reaction to medicines.  Damage to your nerves. What happens before the procedure? Staying hydrated  Follow instructions from your health care provider about hydration, which may include:  Up to 2 hours before the procedure - you may continue to drink clear liquids, such as water, clear fruit juice, black coffee, and plain tea. Eating and drinking restrictions  Follow instructions from your health care provider about eating and drinking, which may include:  8 hours before the procedure - stop eating heavy meals or foods such as meat, fried foods, or fatty foods.  6 hours before the procedure - stop eating light meals or foods, such as toast or cereal.  6 hours before the procedure - stop drinking milk or drinks that contain milk.    2 hours before the procedure - stop drinking clear liquids. Medicine  You may be given medicines to lower anxiety.  Ask your health care provider about:  Changing or stopping your regular medicines. This is especially important if you are taking diabetes medicines or blood thinners.  Taking medicines such as aspirin and ibuprofen. These medicines can thin your blood. Do not take these medicines before your procedure if your health care provider instructs  you not to. General instructions  Plan to have someone take you home from the hospital or clinic. What happens during the procedure?  You may receive a medicine to help you relax (sedative).  You will be asked to lie on your abdomen.  The injection site will be cleaned.  A numbing medicine (local anesthetic) will be used to numb the injection site.  A needle will be inserted through your skin into the epidural space. You may feel some discomfort when this happens. An X-ray machine will be used to make sure the needle is put as close as possible to the affected nerve.  A steroid medicine and a local anesthetic will be injected into the epidural space.  The needle will be removed.  A bandage (dressing) will be put over the injection site. What happens after the procedure?  Your blood pressure, heart rate, breathing rate, and blood oxygen level will be monitored until the medicines you were given have worn off.  Your arm or leg may feel weak or numb for a few hours.  The injection site may feel sore.  Do not drive for 24 hours if you received a sedative. This information is not intended to replace advice given to you by your health care provider. Make sure you discuss any questions you have with your health care provider. Document Released: 05/21/2007 Document Revised: 07/26/2015 Document Reviewed: 05/30/2015 Elsevier Interactive Patient Education  2017 [employer]  

## 2016-04-03 ENCOUNTER — Other Ambulatory Visit: Payer: Self-pay

## 2016-04-03 ENCOUNTER — Telehealth: Payer: Self-pay | Admitting: Anesthesiology

## 2016-04-03 NOTE — Telephone Encounter (Signed)
PA sent for oxycodone IR 5 mg.

## 2016-04-03 NOTE — Telephone Encounter (Signed)
Spoke with patient re; PA being sent and also re; faxing Rx for TENS unit pads/tabs.  Told patient that I would take care of that upon seeing Dr Andree Elk this afternoon.  Also checked on patient and her status after procedure on yesterday.  Verbalizes no questions or concerns.

## 2016-04-03 NOTE — Telephone Encounter (Signed)
Patient called stating her medications needs overide so she can get them, please call patient
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
## 2016-04-03 NOTE — Telephone Encounter (Signed)
Pt needs a prior auth for oxycodone so that she can get her medications. Please call pt and update her on this

## 2016-04-03 NOTE — Telephone Encounter (Signed)
Pt called back she said Dr Andree Elk wrote her a prescription yesterday for a tens unit. Pt tried to take prescription to pharmacy she said pharmacy could not help her. Pt wants to know will Dr. Andree Elk faxed prescription to [phone] [employer] is the name of the company.

## 2016-04-04 NOTE — Telephone Encounter (Signed)
PA sent

## 2016-04-04 NOTE — Telephone Encounter (Signed)
Charles from [place] called stating that patient needs approval from [place] to get her script filled. Please call him at [phone] to discuss what is needed

## 2016-04-29 ENCOUNTER — Encounter: Payer: Self-pay | Admitting: Anesthesiology

## 2016-04-29 ENCOUNTER — Ambulatory Visit: Payer: Medicaid Other | Attending: Anesthesiology | Admitting: Anesthesiology

## 2016-04-29 VITALS — BP 147/96 | HR 94 | Temp 98.0°F | Resp 16 | Ht 65.5 in | Wt 235.0 lb

## 2016-04-29 DIAGNOSIS — M5136 Other intervertebral disc degeneration, lumbar region: Secondary | ICD-10-CM | POA: Diagnosis not present

## 2016-04-29 DIAGNOSIS — M539 Dorsopathy, unspecified: Secondary | ICD-10-CM

## 2016-04-29 DIAGNOSIS — M4696 Unspecified inflammatory spondylopathy, lumbar region: Secondary | ICD-10-CM | POA: Diagnosis not present

## 2016-04-29 DIAGNOSIS — G8929 Other chronic pain: Secondary | ICD-10-CM | POA: Diagnosis not present

## 2016-04-29 DIAGNOSIS — M5431 Sciatica, right side: Secondary | ICD-10-CM | POA: Diagnosis not present

## 2016-04-29 DIAGNOSIS — F17200 Nicotine dependence, unspecified, uncomplicated: Secondary | ICD-10-CM | POA: Diagnosis not present

## 2016-04-29 DIAGNOSIS — M25551 Pain in right hip: Secondary | ICD-10-CM | POA: Insufficient documentation

## 2016-04-29 DIAGNOSIS — F419 Anxiety disorder, unspecified: Secondary | ICD-10-CM | POA: Insufficient documentation

## 2016-04-29 DIAGNOSIS — Z79899 Other long term (current) drug therapy: Secondary | ICD-10-CM | POA: Insufficient documentation

## 2016-04-29 DIAGNOSIS — F329 Major depressive disorder, single episode, unspecified: Secondary | ICD-10-CM | POA: Diagnosis not present

## 2016-04-29 DIAGNOSIS — M47816 Spondylosis without myelopathy or radiculopathy, lumbar region: Secondary | ICD-10-CM

## 2016-04-29 DIAGNOSIS — M5386 Other specified dorsopathies, lumbar region: Secondary | ICD-10-CM

## 2016-04-29 DIAGNOSIS — M79671 Pain in right foot: Secondary | ICD-10-CM | POA: Diagnosis not present

## 2016-04-29 MED ORDER — PREGABALIN 75 MG PO CAPS: 150.0000 mg | ORAL_CAPSULE | Freq: Two times a day (BID) | ORAL | 5 refills | Status: DC

## 2016-04-29 MED ORDER — TRAMADOL HCL 50 MG PO TABS: 50.0000 mg | ORAL_TABLET | Freq: Two times a day (BID) | ORAL | 5 refills | Status: DC

## 2016-04-29 MED ORDER — PREGABALIN 75 MG PO CAPS: 75.0000 mg | ORAL_CAPSULE | Freq: Two times a day (BID) | ORAL | 1 refills | Status: DC

## 2016-04-29 MED ORDER — OXYCODONE HCL 5 MG PO TABS: 5.0000 mg | ORAL_TABLET | Freq: Three times a day (TID) | ORAL | 0 refills | Status: DC

## 2016-04-29 MED ORDER — CYCLOBENZAPRINE HCL 10 MG PO TABS: 10.0000 mg | ORAL_TABLET | Freq: Two times a day (BID) | ORAL | 5 refills | Status: DC
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
## 2016-04-29 NOTE — Patient Instructions (Signed)
Rx for oxycodone 5 mg IR x 2 months, lyrica 150 mg bid x 5 refills and tramadol  bid x 5 refills given to patient Flexeril escribed to pharmacy.     GENERAL RISKS AND COMPLICATIONS  What are the risk, side effects and possible complications? Generally speaking, most procedures are safe.  However, with any procedure there are risks, side effects, and the possibility of complications.  The risks and complications are dependent upon the sites that are lesioned, or the type of nerve block to be performed.  The closer the procedure is to the spine, the more serious the risks are.  Great care is taken when placing the radio frequency needles, block needles or lesioning probes, but sometimes complications can occur. 1. Infection: Any time there is an injection through the skin, there is a risk of infection.  This is why sterile conditions are used for these blocks.  There are four possible types of infection. 1. Localized skin infection. 2. Central Nervous System Infection-This can be in the form of Meningitis, which can be deadly. 3. Epidural Infections-This can be in the form of an epidural abscess, which can cause pressure inside of the spine, causing compression of the spinal cord with subsequent paralysis. This would require an emergency surgery to decompress, and there are no guarantees that the patient would recover from the paralysis. 4. Discitis-This is an infection of the intervertebral discs.  It occurs in about 1% of discography procedures.  It is difficult to treat and it may lead to surgery.        2. Pain: the needles have to go through skin and soft tissues, will cause soreness.       3. Damage to internal structures:  The nerves to be lesioned may be near blood vessels or    other nerves which can be potentially damaged.       4. Bleeding: Bleeding is more common if the patient is taking blood thinners such as  aspirin, Coumadin, Ticiid, Plavix, etc., or if he/she have some genetic  predisposition  such as hemophilia. Bleeding into the spinal canal can cause compression of the spinal  cord with subsequent paralysis.  This would require an emergency surgery to  decompress and there are no guarantees that the patient would recover from the  paralysis.       5. Pneumothorax:  Puncturing of a lung is a possibility, every time a needle is introduced in  the area of the chest or upper back.  Pneumothorax refers to free air around the  collapsed lung(s), inside of the thoracic cavity (chest cavity).  Another two possible  complications related to a similar event would include: Hemothorax and Chylothorax.   These are variations of the Pneumothorax, where instead of air around the collapsed  lung(s), you may have blood or chyle, respectively.       6. Spinal headaches: They may occur with any procedures in the area of the spine.       7. Persistent CSF (Cerebro-Spinal Fluid) leakage: This is a rare problem, but may occur  with prolonged intrathecal or epidural catheters either due to the formation of a fistulous  track or a dural tear.       8. Nerve damage: By working so close to the spinal cord, there is always a possibility of  nerve damage, which could be as serious as a permanent spinal cord injury with  paralysis.       9. Death:  Although rare,  severe deadly allergic reactions known as "Anaphylactic  reaction" can occur to any of the medications used.      10. Worsening of the symptoms:  We can always make thing worse.  What are the chances of something like this happening? Chances of any of this occuring are extremely low.  By statistics, you have more of a chance of getting killed in a motor vehicle accident: while driving to the hospital than any of the above occurring .  Nevertheless, you should be aware that they are possibilities.  In general, it is similar to taking a shower.  Everybody knows that you can slip, hit your head and get killed.  Does that mean that you should not  shower again?  Nevertheless always keep in mind that statistics do not mean anything if you happen to be on the wrong side of them.  Even if a procedure has a 1 (one) in a 1,000,000 (million) chance of going wrong, it you happen to be that one..Also, keep in mind that by statistics, you have more of a chance of having something go wrong when taking medications.  Who should not have this procedure? If you are on a blood thinning medication (e.g. Coumadin, Plavix, see list of "Blood Thinners"), or if you have an active infection going on, you should not have the procedure.  If you are taking any blood thinners, please inform your physician.  How should I prepare for this procedure?  Do not eat or drink anything at least six hours prior to the procedure.  Bring a driver with you .  It cannot be a taxi.  Come accompanied by an adult that can drive you back, and that is strong enough to help you if your legs get weak or numb from the local anesthetic.  Take all of your medicines the morning of the procedure with just enough water to swallow them.  If you have diabetes, make sure that you are scheduled to have your procedure done first thing in the morning, whenever possible.  If you have diabetes, take only half of your insulin dose and notify our nurse that you have done so as soon as you arrive at the clinic.  If you are diabetic, but only take blood sugar pills (oral hypoglycemic), then do not take them on the morning of your procedure.  You may take them after you have had the procedure.  Do not take aspirin or any aspirin-containing medications, at least eleven (11) days prior to the procedure.  They may prolong bleeding.  Wear loose fitting clothing that may be easy to take off and that you would not mind if it got stained with Betadine or blood.  Do not wear any jewelry or perfume  Remove any nail coloring.  It will interfere with some of our monitoring equipment.  NOTE: Remember that  this is not meant to be interpreted as a complete list of all possible complications.  Unforeseen problems may occur.  BLOOD THINNERS The following drugs contain aspirin or other products, which can cause increased bleeding during surgery and should not be taken for 2 weeks prior to and 1 week after surgery.  If you should need take something for relief of minor pain, you may take acetaminophen which is found in Tylenol,m Datril, Anacin-3 and Panadol. It is not blood thinner. The products listed below are.  Do not take any of the products listed below in addition to any listed on your instruction sheet.  A.P.C  or A.P.C with Codeine Codeine Phosphate Capsules #3 Ibuprofen Ridaura  ABC compound Congesprin Imuran rimadil  Advil Cope Indocin Robaxisal  Alka-Seltzer Effervescent Pain Reliever and Antacid Coricidin or Coricidin-D  Indomethacin Rufen  Alka-Seltzer plus Cold Medicine Cosprin Ketoprofen S-A-C Tablets  Anacin Analgesic Tablets or Capsules Coumadin Korlgesic Salflex  Anacin Extra Strength Analgesic tablets or capsules CP-2 Tablets Lanoril Salicylate  Anaprox Cuprimine Capsules Levenox Salocol  Anexsia-D Dalteparin Magan Salsalate  Anodynos Darvon compound Magnesium Salicylate Sine-off  Ansaid Dasin Capsules Magsal Sodium Salicylate  Anturane Depen Capsules Marnal Soma  APF Arthritis pain formula Dewitt's Pills Measurin Stanback  Argesic Dia-Gesic Meclofenamic Sulfinpyrazone  Arthritis Bayer Timed Release Aspirin Diclofenac Meclomen Sulindac  Arthritis pain formula Anacin Dicumarol Medipren Supac  Analgesic (Safety coated) Arthralgen Diffunasal Mefanamic Suprofen  Arthritis Strength Bufferin Dihydrocodeine Mepro Compound Suprol  Arthropan liquid Dopirydamole Methcarbomol with Aspirin Synalgos  ASA tablets/Enseals Disalcid Micrainin Tagament  Ascriptin Doan's Midol Talwin  Ascriptin A/D Dolene Mobidin Tanderil  Ascriptin Extra Strength Dolobid Moblgesic Ticlid  Ascriptin with Codeine  Doloprin or Doloprin with Codeine Momentum Tolectin  Asperbuf Duoprin Mono-gesic Trendar  Aspergum Duradyne Motrin or Motrin IB Triminicin  Aspirin plain, buffered or enteric coated Durasal Myochrisine Trigesic  Aspirin Suppositories Easprin Nalfon Trillsate  Aspirin with Codeine Ecotrin Regular or Extra Strength Naprosyn Uracel  Atromid-S Efficin Naproxen Ursinus  Auranofin Capsules Elmiron Neocylate Vanquish  Axotal Emagrin Norgesic Verin  Azathioprine Empirin or Empirin with Codeine Normiflo Vitamin E  Azolid Emprazil Nuprin Voltaren  Bayer Aspirin plain, buffered or children's or timed BC Tablets or powders Encaprin Orgaran Warfarin Sodium  Buff-a-Comp Enoxaparin Orudis Zorpin  Buff-a-Comp with Codeine Equegesic Os-Cal-Gesic   Buffaprin Excedrin plain, buffered or Extra Strength Oxalid   Bufferin Arthritis Strength Feldene Oxphenbutazone   Bufferin plain or Extra Strength Feldene Capsules Oxycodone with Aspirin   Bufferin with Codeine Fenoprofen Fenoprofen Pabalate or Pabalate-SF   Buffets II Flogesic Panagesic   Buffinol plain or Extra Strength Florinal or Florinal with Codeine Panwarfarin   Buf-Tabs Flurbiprofen Penicillamine   Butalbital Compound Four-way cold tablets Penicillin   Butazolidin Fragmin Pepto-Bismol   Carbenicillin Geminisyn Percodan   Carna Arthritis Reliever Geopen Persantine   Carprofen Gold's salt Persistin   Chloramphenicol Goody's Phenylbutazone   Chloromycetin Haltrain Piroxlcam   Clmetidine heparin Plaquenil   Cllnoril Hyco-pap Ponstel   Clofibrate Hydroxy chloroquine Propoxyphen         Before stopping any of these medications, be sure to consult the physician who ordered them.  Some, such as Coumadin (Warfarin) are ordered to prevent or treat serious conditions such as "deep thrombosis", "pumonary embolisms", and other heart problems.  The amount of time that you may need off of the medication may also vary with the medication and the reason for which  you were taking it.  If you are taking any of these medications, please make sure you notify your pain physician before you undergo any procedures.         Epidural Steroid Injection Patient Information  Description: The epidural space surrounds the nerves as they exit the spinal cord.  In some patients, the nerves can be compressed and inflamed by a bulging disc or a tight spinal canal (spinal stenosis).  By injecting steroids into the epidural space, we can bring irritated nerves into direct contact with a potentially helpful medication.  These steroids act directly on the irritated nerves and can reduce swelling and inflammation which often leads to decreased pain.  Epidural steroids may be injected  anywhere along the spine and from the neck to the low back depending upon the location of your pain.   After numbing the skin with local anesthetic (like Novocaine), a small needle is passed into the epidural space slowly.  You may experience a sensation of pressure while this is being done.  The entire block usually last less than 10 minutes.  Conditions which may be treated by epidural steroids:   Low back and leg pain  Neck and arm pain  Spinal stenosis  Post-laminectomy syndrome  Herpes zoster (shingles) pain  Pain from compression fractures  Preparation for the injection:  1. Do not eat any solid food or dairy products within 8 hours of your appointment.  2. You may drink clear liquids up to 3 hours before appointment.  Clear liquids include water, black coffee, juice or soda.  No milk or cream please. 3. You may take your regular medication, including pain medications, with a sip of water before your appointment  Diabetics should hold regular insulin (if taken separately) and take 1/2 normal NPH dos the morning of the procedure.  Carry some sugar containing items with you to your appointment. 4. A driver must accompany you and be prepared to drive you home after your procedure.   5. Bring all your current medications with your. 6. An IV may be inserted and sedation may be given at the discretion of the physician.   7. A blood pressure cuff, EKG and other monitors will often be applied during the procedure.  Some patients may need to have extra oxygen administered for a short period. 8. You will be asked to provide medical information, including your allergies, prior to the procedure.  We must know immediately if you are taking blood thinners (like Coumadin/Warfarin)  Or if you are allergic to IV iodine contrast (dye). We must know if you could possible be pregnant.  Possible side-effects:  Bleeding from needle site  Infection (rare, may require surgery)  Nerve injury (rare)  Numbness & tingling (temporary)  Difficulty urinating (rare, temporary)  Spinal headache ( a headache worse with upright posture)  Light -headedness (temporary)  Pain at injection site (several days)  Decreased blood pressure (temporary)  Weakness in arm/leg (temporary)  Pressure sensation in back/neck (temporary)  Call if you experience:  Fever/chills associated with headache or increased back/neck pain.  Headache worsened by an upright position.  New onset weakness or numbness of an extremity below the injection site  Hives or difficulty breathing (go to the emergency room)  Inflammation or drainage at the infection site  Severe back/neck pain  Any new symptoms which are concerning to you  Please note:  Although the local anesthetic injected can often make your back or neck feel good for several hours after the injection, the pain will likely return.  It takes 3-7 days for steroids to work in the epidural space.  You may not notice any pain relief for at least that one week.  If effective, we will often do a series of three injections spaced 3-6 weeks apart to maximally decrease your pain.  After the initial series, we generally will wait several months before  considering a repeat injection of the same type.  If you have any questions, please call [phone] [facility]

## 2016-05-06 ENCOUNTER — Ambulatory Visit (INDEPENDENT_AMBULATORY_CARE_PROVIDER_SITE_OTHER): Payer: Medicaid Other | Admitting: Obstetrics and Gynecology

## 2016-05-06 ENCOUNTER — Encounter: Payer: Self-pay | Admitting: Obstetrics and Gynecology

## 2016-05-06 VITALS — BP 128/78 | Ht 65.0 in | Wt 234.0 lb

## 2016-05-06 DIAGNOSIS — N951 Menopausal and female climacteric states: Secondary | ICD-10-CM | POA: Diagnosis not present

## 2016-05-06 MED ORDER — PAROXETINE MESYLATE 7.5 MG PO CAPS: 7.5000 mg | ORAL_CAPSULE | Freq: Every day | ORAL | 11 refills | Status: AC

## 2016-05-06 NOTE — Progress Notes (Signed)
Obstetrics & Gynecology Office Visit   Chief Complaint  Patient presents with  . Referral Appointment  . Hot Flashes    History of Present Illness: The patient is a 51 y.o. female seen in referral at the request of Othelia Pulling, [facility], at [employer] in [place], [place], for hot flashes.  The patient was seen 2 years ago for a similar issue.  At that time she had recently had an episode of syncope that was unexplained. She had a negative head CT after that episode.  She has had no further episodes of syncope.  She reports a 9-year history of nightly hot flashes that keep her from sleeping.  She states that her head sweats and has to frequently change her pillow case.  She states that these hot flashes have not changed in the past 9 years. However, she has recently had pain in her back and this is making life intolerable. She is hoping to fix the hot flash issue. She states that she has been on progesterone 200mg  in the past with good relief.  She states that her last menses was about 9 months ago.   Review of Systems: Review of Systems  Constitutional:       Night sweats, hot flashes  HENT: Negative.   Eyes: Negative.   Respiratory: Negative.   Cardiovascular: Negative.   Gastrointestinal: Negative.   Genitourinary: Negative.   Musculoskeletal:       Low back pain, sciatic pain  Skin: Negative.   Neurological: Negative.   Endo/Heme/Allergies: Negative.   Psychiatric/Behavioral: Negative.    Past Medical History:  Diagnosis Date  . Anxiety   . Chronic back pain   . Depression     Past Surgical History: Denies  Gynecologic History: per HPI   Family History: Denies history of gynecologic cancer  Social History   Social History  . Marital status: Divorced    Spouse name: N/A  . Number of children: N/A  . Years of education: N/A   Occupational History  . Not on file.   Social History Main Topics  . Smoking status: Light Tobacco Smoker  . Smokeless tobacco: Never  Used  . Alcohol use No  . Drug use: No  . Sexual activity: Not on file   Other Topics Concern  . Not on file   Social History Narrative  . No narrative on file    Allergies  Allergen Reactions  . Penicillins     Medications:   Medication Sig Start Date End Date Taking? Authorizing Provider  cyclobenzaprine (FLEXERIL) 10 MG tablet Take 1 tablet (10 mg total) by mouth 2 (two) times daily. 04/29/16   Molli Barrows, MD  HYDROcodone-acetaminophen (NORCO/VICODIN) 5-325 MG tablet Take 1 tablet by mouth every 4 (four) hours as needed. Patient not taking: Reported on 04/29/2016 01/17/15   Victorino Dike, FNP  ibuprofen (ADVIL,MOTRIN) 200 MG tablet Take 200 mg by mouth every 6 (six) hours as needed.    Historical Provider, MD  ibuprofen (ADVIL,MOTRIN) 200 MG tablet Take by mouth.    Historical Provider, MD  meloxicam (MOBIC) 15 MG tablet Take 1 tablet (15 mg total) by mouth daily. Patient not taking: Reported on 04/29/2016 10/26/15   Charline Bills Cuthriell, PA-C  methocarbamol (ROBAXIN) 500 MG tablet Take 1 tablet (500 mg total) by mouth 4 (four) times daily. Patient not taking: Reported on 02/27/2016 10/26/15   Charline Bills Cuthriell, PA-C  Nerve Stimulator (ZEWA TENS/EMS COMBO UNIT) DEVI by Does  not apply route.    Historical Provider, MD  oxyCODONE (OXY IR/ROXICODONE) 5 MG immediate release tablet Take 1 tablet (5 mg total) by mouth 3 (three) times daily with meals. 04/29/16   Molli Barrows, MD  oxyCODONE-acetaminophen (PERCOCET/ROXICET) 5-325 MG per tablet Take by mouth every 4 (four) hours as needed for severe pain.    Historical Provider, MD  PARoxetine Mesylate (BRISDELLE) 7.5 MG CAPS Take 7.5 mg by mouth daily. 05/06/16 06/05/16  Will Bonnet, MD  predniSONE (STERAPRED UNI-PAK 21 TAB) 10 MG (21) TBPK tablet Take 6 tablets on day 1 Take 5 tablets on day 2 Take 4 tablets on day 3 Take 3 tablets on day 4 Take 2 tablets on day 5 Take 1 tablet on day 6 Patient not taking: Reported on 02/27/2016  01/17/15   Victorino Dike, FNP  pregabalin (LYRICA) 75 MG capsule Take 2 capsules (150 mg total) by mouth 2 (two) times daily. 04/29/16   Molli Barrows, MD  pregabalin (LYRICA) 75 MG capsule Take 1 capsule (75 mg total) by mouth 2 (two) times daily. 04/29/16   Molli Barrows, MD  tiZANidine (ZANAFLEX) 4 MG tablet Take 4 mg by mouth 2 (two) times daily.  09/07/15   Historical Provider, MD  traMADol (ULTRAM) 50 MG tablet Take 1 tablet (50 mg total) by mouth 2 (two) times daily. 04/29/16   Molli Barrows, MD    Physical Exam BP 128/78   Ht 5\' 5"  (1.651 m)   Wt 234 lb (106.1 kg)   LMP  (LMP Unknown)   BMI 38.94 kg/m    General: NAD HEENT: normocephalic, anicteric Thyroid: no enlargement, no palpable nodules Pulmonary: No increased work of breathing Cardiovascular: RRR, distal pulses 2+ Abdomen: NABS, soft, non-tender, non-distended.  Umbilicus without lesions.  No hepatomegaly, splenomegaly or masses palpable. No evidence of hernia  Genitourinary: deferred Extremities: no edema, erythema, or tenderness Neurologic: Grossly intact Psychiatric: mood appropriate, affect full  Assessment: 51 y.o. female with climacteric symptoms, specifically, hot flashes that have been present for many years.  I had a discussion with her last time about treating with estrogen in the setting of smoking.  There are also noted blood pressures in the system that are elevated.  Discussed alternative medicines.  Discussed that it make take a few months to realize the full effect of the medication.  Will start her on Brisdelle. Discussed potential side effects. Discussed that if she stops smoking, she may be a candidate for estrogen therapy.  She was given samples to cover 1 month of use.  Plan: Problem List Items Addressed This Visit    Hot flushes, perimenopausal - Primary   Relevant Medications   PARoxetine Mesylate (BRISDELLE) 7.5 MG CAPS   Climacteric     Return in about 4 weeks (around 06/03/2016) for medication  followup .   Prentice Docker, MD 05/06/2016 3:59 PM    CC: Nathaneil Canary, PA-C [facility] [place], [place] [address]

## 2016-06-04 ENCOUNTER — Ambulatory Visit: Payer: Medicaid Other | Admitting: Obstetrics and Gynecology

## 2016-06-11 ENCOUNTER — Ambulatory Visit
Admission: RE | Admit: 2016-06-11 | Discharge: 2016-06-11 | Disposition: A | Discharge status: Home / Self Care | Payer: Medicaid Other | Source: Ambulatory Visit | Attending: Anesthesiology | Admitting: Anesthesiology

## 2016-06-11 ENCOUNTER — Other Ambulatory Visit: Payer: Self-pay | Admitting: Anesthesiology

## 2016-06-11 ENCOUNTER — Encounter: Payer: Self-pay | Admitting: Anesthesiology

## 2016-06-11 ENCOUNTER — Ambulatory Visit (HOSPITAL_BASED_OUTPATIENT_CLINIC_OR_DEPARTMENT_OTHER): Payer: Medicaid Other | Admitting: Anesthesiology

## 2016-06-11 VITALS — BP 137/89 | HR 76 | Temp 97.1°F | Resp 18 | Ht 65.0 in | Wt 234.0 lb

## 2016-06-11 DIAGNOSIS — G8929 Other chronic pain: Secondary | ICD-10-CM | POA: Insufficient documentation

## 2016-06-11 DIAGNOSIS — M539 Dorsopathy, unspecified: Secondary | ICD-10-CM

## 2016-06-11 DIAGNOSIS — M5136 Other intervertebral disc degeneration, lumbar region: Secondary | ICD-10-CM | POA: Insufficient documentation

## 2016-06-11 DIAGNOSIS — M5442 Lumbago with sciatica, left side: Secondary | ICD-10-CM | POA: Insufficient documentation

## 2016-06-11 DIAGNOSIS — M5386 Other specified dorsopathies, lumbar region: Secondary | ICD-10-CM

## 2016-06-11 DIAGNOSIS — R52 Pain, unspecified: Secondary | ICD-10-CM

## 2016-06-11 MED ORDER — IOPAMIDOL (ISOVUE-M 200) INJECTION 41%
20.0000 mL | Freq: Once | INTRAMUSCULAR | Status: DC | PRN
  Administered 2016-06-11: 10 mL
  Filled 2016-06-11: qty 20

## 2016-06-11 MED ORDER — OXYCODONE HCL 5 MG PO TABS: 5.0000 mg | ORAL_TABLET | Freq: Three times a day (TID) | ORAL | 0 refills | Status: DC

## 2016-06-11 MED ORDER — SODIUM CHLORIDE 0.9% FLUSH
10.0000 mL | Freq: Once | INTRAVENOUS | Status: AC
  Administered 2016-06-11: 10 mL

## 2016-06-11 MED ORDER — LIDOCAINE HCL (PF) 1 % IJ SOLN
5.0000 mL | Freq: Once | INTRAMUSCULAR | Status: AC
  Administered 2016-06-11: 5 mL via SUBCUTANEOUS

## 2016-06-11 MED ORDER — TRIAMCINOLONE ACETONIDE 40 MG/ML IJ SUSP
INTRAMUSCULAR | Status: AC
  Filled 2016-06-11: qty 1

## 2016-06-11 MED ORDER — ROPIVACAINE HCL 2 MG/ML IJ SOLN
10.0000 mL | Freq: Once | INTRAMUSCULAR | Status: AC
  Administered 2016-06-11: 10 mL via EPIDURAL
  Filled 2016-06-11: qty 10

## 2016-06-11 MED ORDER — MIDAZOLAM HCL 5 MG/5ML IJ SOLN
5.0000 mg | Freq: Once | INTRAMUSCULAR | Status: AC
  Administered 2016-06-11: 2 mg via INTRAVENOUS

## 2016-06-11 MED ORDER — IOPAMIDOL (ISOVUE-M 200) INJECTION 41%
INTRAMUSCULAR | Status: AC
  Filled 2016-06-11: qty 10

## 2016-06-11 MED ORDER — MIDAZOLAM HCL 5 MG/5ML IJ SOLN
INTRAMUSCULAR | Status: AC
  Filled 2016-06-11: qty 5

## 2016-06-11 MED ORDER — TRIAMCINOLONE ACETONIDE 40 MG/ML IJ SUSP: 40.0000 mg | Freq: Once | INTRAMUSCULAR | Status: DC

## 2016-06-11 MED ORDER — LIDOCAINE HCL (PF) 1 % IJ SOLN
INTRAMUSCULAR | Status: AC
  Filled 2016-06-11: qty 5

## 2016-06-11 MED ORDER — LACTATED RINGERS IV SOLN
1000.0000 mL | INTRAVENOUS | Status: DC
  Administered 2016-06-11: 1000 mL via INTRAVENOUS

## 2016-06-11 MED ORDER — ROPIVACAINE HCL 2 MG/ML IJ SOLN
INTRAMUSCULAR | Status: AC
  Filled 2016-06-11: qty 10

## 2016-06-11 MED ORDER — SODIUM CHLORIDE 0.9 % IJ SOLN
INTRAMUSCULAR | Status: AC
  Filled 2016-06-11: qty 10
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
## 2016-06-11 NOTE — Progress Notes (Signed)
Safety precautions to be maintained throughout the outpatient stay will include: orient to surroundings, keep bed in low position, maintain call bell within reach at all times, provide assistance with transfer out of bed and ambulation.  Instructed to bring pill bottles at Med refill visits.

## 2016-06-11 NOTE — Patient Instructions (Signed)
Pain Management Discharge Instructions  General Discharge Instructions :  If you need to reach your doctor call: Monday-Friday 8:00 am - 4:00 pm at [phone] or toll free [phone].  After clinic hours [phone] to have operator reach doctor.  Bring all of your medication bottles to all your appointments in the pain clinic.  To cancel or reschedule your appointment with Pain Management please remember to call 24 hours in advance to avoid a fee.  Refer to the educational materials which you have been given on: General Risks, I had my Procedure. Discharge Instructions, Post Sedation.  Post Procedure Instructions:  The drugs you were given will stay in your system until tomorrow, so for the next 24 hours you should not drive, make any legal decisions or drink any alcoholic beverages.  You may eat anything you prefer, but it is better to start with liquids then soups and crackers, and gradually work up to solid foods.  Please notify your doctor immediately if you have any unusual bleeding, trouble breathing or pain that is not related to your normal pain.  Depending on the type of procedure that was done, some parts of your body may feel week and/or numb.  This usually clears up by tonight or the next day.  Walk with the use of an assistive device or accompanied by an adult for the 24 hours.  You may use ice on the affected area for the first 24 hours.  Put ice in a Ziploc bag and cover with a towel and place against area 15 minutes on 15 minutes off.  You may switch to heat after 24 hours.Epidural Steroid Injection An epidural steroid injection is a shot of steroid medicine and numbing medicine that is given into the space between the spinal cord and the bones in your back (epidural space). The shot helps relieve pain caused by an irritated or swollen nerve root. The amount of pain relief you get from the injection depends on what is causing the nerve to be swollen and irritated,  and how long your pain lasts. You are more likely to benefit from this injection if your pain is strong and comes on suddenly rather than if you have had pain for a long time. Tell a health care provider about:  Any allergies you have.  All medicines you are taking, including vitamins, herbs, eye drops, creams, and over-the-counter medicines.  Any problems you or family members have had with anesthetic medicines.  Any blood disorders you have.  Any surgeries you have had.  Any medical conditions you have.  Whether you are pregnant or may be pregnant. What are the risks? Generally, this is a safe procedure. However, problems may occur, including:  Headache.  Bleeding.  Infection.  Allergic reaction to medicines.  Damage to your nerves. What happens before the procedure? Staying hydrated  Follow instructions from your health care provider about hydration, which may include:  Up to 2 hours before the procedure - you may continue to drink clear liquids, such as water, clear fruit juice, black coffee, and plain tea. Eating and drinking restrictions  Follow instructions from your health care provider about eating and drinking, which may include:  8 hours before the procedure - stop eating heavy meals or foods such as meat, fried foods, or fatty foods.  6 hours before the procedure - stop eating light meals or foods, such as toast or cereal.  6 hours before the procedure - stop drinking milk or drinks that contain milk.    2 hours before the procedure - stop drinking clear liquids. Medicine   You may be given medicines to lower anxiety.  Ask your health care provider about:  Changing or stopping your regular medicines. This is especially important if you are taking diabetes medicines or blood thinners.  Taking medicines such as aspirin and ibuprofen. These medicines can thin your blood. Do not take these medicines before your procedure if your health care provider instructs  you not to. General instructions   Plan to have someone take you home from the hospital or clinic. What happens during the procedure?  You may receive a medicine to help you relax (sedative).  You will be asked to lie on your abdomen.  The injection site will be cleaned.  A numbing medicine (local anesthetic) will be used to numb the injection site.  A needle will be inserted through your skin into the epidural space. You may feel some discomfort when this happens. An X-ray machine will be used to make sure the needle is put as close as possible to the affected nerve.  A steroid medicine and a local anesthetic will be injected into the epidural space.  The needle will be removed.  A bandage (dressing) will be put over the injection site. What happens after the procedure?  Your blood pressure, heart rate, breathing rate, and blood oxygen level will be monitored until the medicines you were given have worn off.  Your arm or leg may feel weak or numb for a few hours.  The injection site may feel sore.  Do not drive for 24 hours if you received a sedative. This information is not intended to replace advice given to you by your health care provider. Make sure you discuss any questions you have with your health care provider. Document Released: 05/21/2007 Document Revised: 07/26/2015 Document Reviewed: 05/30/2015 Elsevier Interactive Patient Education  2017 [place]  What are the risk, side effects and possible complications? Generally speaking, most procedures are safe.  However, with any procedure there are risks, side effects, and the possibility of complications.  The risks and complications are dependent upon the sites that are lesioned, or the type of nerve block to be performed.  The closer the procedure is to the spine, the more serious the risks are.  Great care is taken when placing the radio frequency needles, block needles or lesioning  probes, but sometimes complications can occur. 1. Infection: Any time there is an injection through the skin, there is a risk of infection.  This is why sterile conditions are used for these blocks.  There are four possible types of infection. 1. Localized skin infection. 2. Central Nervous System Infection-This can be in the form of Meningitis, which can be deadly. 3. Epidural Infections-This can be in the form of an epidural abscess, which can cause pressure inside of the spine, causing compression of the spinal cord with subsequent paralysis. This would require an emergency surgery to decompress, and there are no guarantees that the patient would recover from the paralysis. 4. Discitis-This is an infection of the intervertebral discs.  It occurs in about 1% of discography procedures.  It is difficult to treat and it may lead to surgery.        2. Pain: the needles have to go through skin and soft tissues, will cause soreness.       3. Damage to internal structures:  The nerves to be lesioned may be near blood vessels or  other nerves which can be potentially damaged.       4. Bleeding: Bleeding is more common if the patient is taking blood thinners such as  aspirin, Coumadin, Ticiid, Plavix, etc., or if he/she have some genetic predisposition  such as hemophilia. Bleeding into the spinal canal can cause compression of the spinal  cord with subsequent paralysis.  This would require an emergency surgery to  decompress and there are no guarantees that the patient would recover from the  paralysis.       5. Pneumothorax:  Puncturing of a lung is a possibility, every time a needle is introduced in  the area of the chest or upper back.  Pneumothorax refers to free air around the  collapsed lung(s), inside of the thoracic cavity (chest cavity).  Another two possible  complications related to a similar event would include: Hemothorax and Chylothorax.   These are variations of the Pneumothorax, where instead  of air around the collapsed  lung(s), you may have blood or chyle, respectively.       6. Spinal headaches: They may occur with any procedures in the area of the spine.       7. Persistent CSF (Cerebro-Spinal Fluid) leakage: This is a rare problem, but may occur  with prolonged intrathecal or epidural catheters either due to the formation of a fistulous  track or a dural tear.       8. Nerve damage: By working so close to the spinal cord, there is always a possibility of  nerve damage, which could be as serious as a permanent spinal cord injury with  paralysis.       9. Death:  Although rare, severe deadly allergic reactions known as "Anaphylactic  reaction" can occur to any of the medications used.      10. Worsening of the symptoms:  We can always make thing worse.  What are the chances of something like this happening? Chances of any of this occuring are extremely low.  By statistics, you have more of a chance of getting killed in a motor vehicle accident: while driving to the hospital than any of the above occurring .  Nevertheless, you should be aware that they are possibilities.  In general, it is similar to taking a shower.  Everybody knows that you can slip, hit your head and get killed.  Does that mean that you should not shower again?  Nevertheless always keep in mind that statistics do not mean anything if you happen to be on the wrong side of them.  Even if a procedure has a 1 (one) in a 1,000,000 (million) chance of going wrong, it you happen to be that one..Also, keep in mind that by statistics, you have more of a chance of having something go wrong when taking medications.  Who should not have this procedure? If you are on a blood thinning medication (e.g. Coumadin, Plavix, see list of "Blood Thinners"), or if you have an active infection going on, you should not have the procedure.  If you are taking any blood thinners, please inform your physician.  How should I prepare for this  procedure?  Do not eat or drink anything at least six hours prior to the procedure.  Bring a driver with you .  It cannot be a taxi.  Come accompanied by an adult that can drive you back, and that is strong enough to help you if your legs get weak or numb from the local anesthetic.  Take  all of your medicines the morning of the procedure with just enough water to swallow them.  If you have diabetes, make sure that you are scheduled to have your procedure done first thing in the morning, whenever possible.  If you have diabetes, take only half of your insulin dose and notify our nurse that you have done so as soon as you arrive at the clinic.  If you are diabetic, but only take blood sugar pills (oral hypoglycemic), then do not take them on the morning of your procedure.  You may take them after you have had the procedure.  Do not take aspirin or any aspirin-containing medications, at least eleven (11) days prior to the procedure.  They may prolong bleeding.  Wear loose fitting clothing that may be easy to take off and that you would not mind if it got stained with Betadine or blood.  Do not wear any jewelry or perfume  Remove any nail coloring.  It will interfere with some of our monitoring equipment.  NOTE: Remember that this is not meant to be interpreted as a complete list of all possible complications.  Unforeseen problems may occur.  BLOOD THINNERS The following drugs contain aspirin or other products, which can cause increased bleeding during surgery and should not be taken for 2 weeks prior to and 1 week after surgery.  If you should need take something for relief of minor pain, you may take acetaminophen which is found in Tylenol,m Datril, Anacin-3 and Panadol. It is not blood thinner. The products listed below are.  Do not take any of the products listed below in addition to any listed on your instruction sheet.  A.P.C or A.P.C with Codeine Codeine Phosphate Capsules #3  Ibuprofen Ridaura  ABC compound Congesprin Imuran rimadil  Advil Cope Indocin Robaxisal  Alka-Seltzer Effervescent Pain Reliever and Antacid Coricidin or Coricidin-D  Indomethacin Rufen  Alka-Seltzer plus Cold Medicine Cosprin Ketoprofen S-A-C Tablets  Anacin Analgesic Tablets or Capsules Coumadin Korlgesic Salflex  Anacin Extra Strength Analgesic tablets or capsules CP-2 Tablets Lanoril Salicylate  Anaprox Cuprimine Capsules Levenox Salocol  Anexsia-D Dalteparin Magan Salsalate  Anodynos Darvon compound Magnesium Salicylate Sine-off  Ansaid Dasin Capsules Magsal Sodium Salicylate  Anturane Depen Capsules Marnal Soma  APF Arthritis pain formula Dewitt's Pills Measurin Stanback  Argesic Dia-Gesic Meclofenamic Sulfinpyrazone  Arthritis Bayer Timed Release Aspirin Diclofenac Meclomen Sulindac  Arthritis pain formula Anacin Dicumarol Medipren Supac  Analgesic (Safety coated) Arthralgen Diffunasal Mefanamic Suprofen  Arthritis Strength Bufferin Dihydrocodeine Mepro Compound Suprol  Arthropan liquid Dopirydamole Methcarbomol with Aspirin Synalgos  ASA tablets/Enseals Disalcid Micrainin Tagament  Ascriptin Doan's Midol Talwin  Ascriptin A/D Dolene Mobidin Tanderil  Ascriptin Extra Strength Dolobid Moblgesic Ticlid  Ascriptin with Codeine Doloprin or Doloprin with Codeine Momentum Tolectin  Asperbuf Duoprin Mono-gesic Trendar  Aspergum Duradyne Motrin or Motrin IB Triminicin  Aspirin plain, buffered or enteric coated Durasal Myochrisine Trigesic  Aspirin Suppositories Easprin Nalfon Trillsate  Aspirin with Codeine Ecotrin Regular or Extra Strength Naprosyn Uracel  Atromid-S Efficin Naproxen Ursinus  Auranofin Capsules Elmiron Neocylate Vanquish  Axotal Emagrin Norgesic Verin  Azathioprine Empirin or Empirin with Codeine Normiflo Vitamin E  Azolid Emprazil Nuprin Voltaren  Bayer Aspirin plain, buffered or children's or timed BC Tablets or powders Encaprin Orgaran Warfarin Sodium    Buff-a-Comp Enoxaparin Orudis Zorpin  Buff-a-Comp with Codeine Equegesic Os-Cal-Gesic   Buffaprin Excedrin plain, buffered or Extra Strength Oxalid   Bufferin Arthritis Strength Feldene Oxphenbutazone   Bufferin plain or Extra Strength Feldene  Capsules Oxycodone with Aspirin   Bufferin with Codeine Fenoprofen Fenoprofen Pabalate or Pabalate-SF   Buffets II Flogesic Panagesic   Buffinol plain or Extra Strength Florinal or Florinal with Codeine Panwarfarin   Buf-Tabs Flurbiprofen Penicillamine   Butalbital Compound Four-way cold tablets Penicillin   Butazolidin Fragmin Pepto-Bismol   Carbenicillin Geminisyn Percodan   Carna Arthritis Reliever Geopen Persantine   Carprofen Gold's salt Persistin   Chloramphenicol Goody's Phenylbutazone   Chloromycetin Haltrain Piroxlcam   Clmetidine heparin Plaquenil   Cllnoril Hyco-pap Ponstel   Clofibrate Hydroxy chloroquine Propoxyphen         Before stopping any of these medications, be sure to consult the physician who ordered them.  Some, such as Coumadin (Warfarin) are ordered to prevent or treat serious conditions such as "deep thrombosis", "pumonary embolisms", and other heart problems.  The amount of time that you may need off of the medication may also vary with the medication and the reason for which you were taking it.  If you are taking any of these medications, please make sure you notify your pain physician before you undergo any procedures.

## 2016-06-12 ENCOUNTER — Telehealth: Payer: Self-pay | Admitting: *Deleted

## 2016-06-12 NOTE — Telephone Encounter (Signed)
denies problems post procedure

## 2016-06-24 ENCOUNTER — Ambulatory Visit (INDEPENDENT_AMBULATORY_CARE_PROVIDER_SITE_OTHER): Payer: Medicaid Other | Admitting: Obstetrics and Gynecology

## 2016-06-24 ENCOUNTER — Encounter: Payer: Self-pay | Admitting: Obstetrics and Gynecology

## 2016-06-24 VITALS — BP 138/82 | HR 92 | Ht 65.5 in | Wt 234.0 lb

## 2016-06-24 DIAGNOSIS — N951 Menopausal and female climacteric states: Secondary | ICD-10-CM

## 2016-06-24 MED ORDER — PAROXETINE HCL 20 MG PO TABS: 20.0000 mg | ORAL_TABLET | Freq: Every day | ORAL | 1 refills | Status: DC

## 2016-06-24 NOTE — Progress Notes (Signed)
  History of Present Illness:  Hannah Maynard is a 51 y.o. who was started on Brisdelle approximately 4 weeks ago. Since that time, she states that her symptoms show no change.  PMHx: She  has a past medical history of Anxiety; Chronic back pain; and Depression. Also,  has no past surgical history on file., family history is not on file.,  reports that she has been smoking.  She has never used smokeless tobacco. She reports that she does not drink alcohol or use drugs.  She has a current medication list which includes the following prescription(s): cyclobenzaprine, ibuprofen, zewa tens/ems combo unit, oxycodone, paroxetine, pregabalin, and tramadol, and the following Facility-Administered Medications: iopamidol, lactated ringers, lidocaine (pf), midazolam, ropivacaine (pf) 2 mg/ml (0.2%), sodium chloride flush, and triamcinolone acetonide. Also, is allergic to duloxetine and penicillins.  Review of Systems  Constitutional: Negative.  Negative for chills, diaphoresis, fever, malaise/fatigue and weight loss.       +hot flashes  HENT: Negative.   Eyes: Negative.   Respiratory: Negative.   Cardiovascular: Negative.   Gastrointestinal: Negative.   Genitourinary: Negative.   Musculoskeletal: Negative.   Skin: Negative.   Neurological: Negative.  Negative for weakness.  Psychiatric/Behavioral: Negative.     Physical Exam:  BP 138/82   Pulse 92   Ht 5' 5.5" (1.664 m)   Wt 234 lb (106.1 kg)   LMP  (LMP Unknown)   SpO2 97%   BMI 38.35 kg/m  Body mass index is 38.35 kg/m. Constitutional: Well nourished, well developed female in no acute distress.  Abdomen: diffusely non tender to palpation, non distended, and no masses, hernias Neuro: Grossly intact Psych:  Normal mood and affect.    Assessment:  Problem List Items Addressed This Visit    Climacteric - Primary   Relevant Medications   PARoxetine (PAXIL) 20 MG tablet     Medication treatment has not so far shown benefit. Will  increase dosing.  Plan:  Increase paroxetine to 20mg  from 7.5mg .  f/u 6 weeks She was amenable to this plan and we will see her back in 6 weeks.  15 minutes spent in face to face discussion with > 50% spent in counseling and management of her climacteric symptoms.   Prentice Docker, MD  [facility], [facility] 06/24/2016  5:49 PM

## 2016-07-26 ENCOUNTER — Telehealth: Payer: Self-pay

## 2016-07-26 NOTE — Telephone Encounter (Signed)
Tried to call pt re [employer].  LM c female to have pt call at 11:35am.  At 4:11 vm was full.

## 2016-07-30 ENCOUNTER — Ambulatory Visit: Payer: Medicaid Other | Attending: Anesthesiology | Admitting: Anesthesiology

## 2016-07-30 ENCOUNTER — Encounter: Payer: Self-pay | Admitting: Anesthesiology

## 2016-07-30 VITALS — BP 144/95 | HR 84 | Temp 98.3°F | Resp 16 | Ht 69.0 in | Wt 236.0 lb

## 2016-07-30 DIAGNOSIS — M79605 Pain in left leg: Secondary | ICD-10-CM | POA: Insufficient documentation

## 2016-07-30 DIAGNOSIS — M5431 Sciatica, right side: Secondary | ICD-10-CM | POA: Insufficient documentation

## 2016-07-30 DIAGNOSIS — Z79899 Other long term (current) drug therapy: Secondary | ICD-10-CM | POA: Insufficient documentation

## 2016-07-30 DIAGNOSIS — M539 Dorsopathy, unspecified: Secondary | ICD-10-CM

## 2016-07-30 DIAGNOSIS — F419 Anxiety disorder, unspecified: Secondary | ICD-10-CM | POA: Insufficient documentation

## 2016-07-30 DIAGNOSIS — R531 Weakness: Secondary | ICD-10-CM | POA: Insufficient documentation

## 2016-07-30 DIAGNOSIS — M4686 Other specified inflammatory spondylopathies, lumbar region: Secondary | ICD-10-CM | POA: Insufficient documentation

## 2016-07-30 DIAGNOSIS — M79604 Pain in right leg: Secondary | ICD-10-CM | POA: Diagnosis not present

## 2016-07-30 DIAGNOSIS — M4696 Unspecified inflammatory spondylopathy, lumbar region: Secondary | ICD-10-CM | POA: Diagnosis not present

## 2016-07-30 DIAGNOSIS — M5386 Other specified dorsopathies, lumbar region: Secondary | ICD-10-CM

## 2016-07-30 DIAGNOSIS — M5136 Other intervertebral disc degeneration, lumbar region: Secondary | ICD-10-CM | POA: Diagnosis not present

## 2016-07-30 DIAGNOSIS — M47816 Spondylosis without myelopathy or radiculopathy, lumbar region: Secondary | ICD-10-CM

## 2016-07-30 DIAGNOSIS — F17200 Nicotine dependence, unspecified, uncomplicated: Secondary | ICD-10-CM | POA: Diagnosis not present

## 2016-07-30 DIAGNOSIS — F329 Major depressive disorder, single episode, unspecified: Secondary | ICD-10-CM | POA: Diagnosis not present

## 2016-07-30 DIAGNOSIS — R29898 Other symptoms and signs involving the musculoskeletal system: Secondary | ICD-10-CM | POA: Diagnosis not present

## 2016-07-30 DIAGNOSIS — M25551 Pain in right hip: Secondary | ICD-10-CM | POA: Insufficient documentation

## 2016-07-30 DIAGNOSIS — G8929 Other chronic pain: Secondary | ICD-10-CM | POA: Diagnosis not present

## 2016-07-30 MED ORDER — OXYCODONE HCL 5 MG PO TABS: 5.0000 mg | ORAL_TABLET | Freq: Three times a day (TID) | ORAL | 0 refills | Status: DC

## 2016-07-30 NOTE — Progress Notes (Signed)
Nursing Pain Medication Assessment:  Safety precautions to be maintained throughout the outpatient stay will include: orient to surroundings, keep bed in low position, maintain call bell within reach at all times, provide assistance with transfer out of bed and ambulation.  Medication Inspection Compliance: Pill count conducted under aseptic conditions, in front of the patient. Neither the pills nor the bottle was removed from the patient's sight at any time. Once count was completed pills were immediately returned to the patient in their original bottle.  Medication: Oxycodone IR Pill/Patch Count: 2 of 90 pills remain Pill/Patch Appearance: Markings consistent with prescribed medication Bottle Appearance: Standard pharmacy container. Clearly labeled. Filled Date: 05 / 05 / 2018 Last Medication intake:  Today
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
## 2016-07-30 NOTE — Patient Instructions (Signed)
GENERAL RISKS AND COMPLICATIONS  What are the risk, side effects and possible complications? Generally speaking, most procedures are safe.  However, with any procedure there are risks, side effects, and the possibility of complications.  The risks and complications are dependent upon the sites that are lesioned, or the type of nerve block to be performed.  The closer the procedure is to the spine, the more serious the risks are.  Great care is taken when placing the radio frequency needles, block needles or lesioning probes, but sometimes complications can occur. 1. Infection: Any time there is an injection through the skin, there is a risk of infection.  This is why sterile conditions are used for these blocks.  There are four possible types of infection. 1. Localized skin infection. 2. Central Nervous System Infection-This can be in the form of Meningitis, which can be deadly. 3. Epidural Infections-This can be in the form of an epidural abscess, which can cause pressure inside of the spine, causing compression of the spinal cord with subsequent paralysis. This would require an emergency surgery to decompress, and there are no guarantees that the patient would recover from the paralysis. 4. Discitis-This is an infection of the intervertebral discs.  It occurs in about 1% of discography procedures.  It is difficult to treat and it may lead to surgery.        2. Pain: the needles have to go through skin and soft tissues, will cause soreness.       3. Damage to internal structures:  The nerves to be lesioned may be near blood vessels or    other nerves which can be potentially damaged.       4. Bleeding: Bleeding is more common if the patient is taking blood thinners such as  aspirin, Coumadin, Ticiid, Plavix, etc., or if he/she have some genetic predisposition  such as hemophilia. Bleeding into the spinal canal can cause compression of the spinal  cord with subsequent paralysis.  This would require an  emergency surgery to  decompress and there are no guarantees that the patient would recover from the  paralysis.       5. Pneumothorax:  Puncturing of a lung is a possibility, every time a needle is introduced in  the area of the chest or upper back.  Pneumothorax refers to free air around the  collapsed lung(s), inside of the thoracic cavity (chest cavity).  Another two possible  complications related to a similar event would include: Hemothorax and Chylothorax.   These are variations of the Pneumothorax, where instead of air around the collapsed  lung(s), you may have blood or chyle, respectively.       6. Spinal headaches: They may occur with any procedures in the area of the spine.       7. Persistent CSF (Cerebro-Spinal Fluid) leakage: This is a rare problem, but may occur  with prolonged intrathecal or epidural catheters either due to the formation of a fistulous  track or a dural tear.       8. Nerve damage: By working so close to the spinal cord, there is always a possibility of  nerve damage, which could be as serious as a permanent spinal cord injury with  paralysis.       9. Death:  Although rare, severe deadly allergic reactions known as "Anaphylactic  reaction" can occur to any of the medications used.      10. Worsening of the symptoms:  We can always make thing worse.    What are the chances of something like this happening? Chances of any of this occuring are extremely low.  By statistics, you have more of a chance of getting killed in a motor vehicle accident: while driving to the hospital than any of the above occurring .  Nevertheless, you should be aware that they are possibilities.  In general, it is similar to taking a shower.  Everybody knows that you can slip, hit your head and get killed.  Does that mean that you should not shower again?  Nevertheless always keep in mind that statistics do not mean anything if you happen to be on the wrong side of them.  Even if a procedure has a 1  (one) in a 1,000,000 (million) chance of going wrong, it you happen to be that one..Also, keep in mind that by statistics, you have more of a chance of having something go wrong when taking medications.  Who should not have this procedure? If you are on a blood thinning medication (e.g. Coumadin, Plavix, see list of "Blood Thinners"), or if you have an active infection going on, you should not have the procedure.  If you are taking any blood thinners, please inform your physician.  How should I prepare for this procedure?  Do not eat or drink anything at least six hours prior to the procedure.  Bring a driver with you .  It cannot be a taxi.  Come accompanied by an adult that can drive you back, and that is strong enough to help you if your legs get weak or numb from the local anesthetic.  Take all of your medicines the morning of the procedure with just enough water to swallow them.  If you have diabetes, make sure that you are scheduled to have your procedure done first thing in the morning, whenever possible.  If you have diabetes, take only half of your insulin dose and notify our nurse that you have done so as soon as you arrive at the clinic.  If you are diabetic, but only take blood sugar pills (oral hypoglycemic), then do not take them on the morning of your procedure.  You may take them after you have had the procedure.  Do not take aspirin or any aspirin-containing medications, at least eleven (11) days prior to the procedure.  They may prolong bleeding.  Wear loose fitting clothing that may be easy to take off and that you would not mind if it got stained with Betadine or blood.  Do not wear any jewelry or perfume  Remove any nail coloring.  It will interfere with some of our monitoring equipment.  NOTE: Remember that this is not meant to be interpreted as a complete list of all possible complications.  Unforeseen problems may occur.  BLOOD THINNERS The following drugs  contain aspirin or other products, which can cause increased bleeding during surgery and should not be taken for 2 weeks prior to and 1 week after surgery.  If you should need take something for relief of minor pain, you may take acetaminophen which is found in Tylenol,m Datril, Anacin-3 and Panadol. It is not blood thinner. The products listed below are.  Do not take any of the products listed below in addition to any listed on your instruction sheet.  A.P.C or A.P.C with Codeine Codeine Phosphate Capsules #3 Ibuprofen Ridaura  ABC compound Congesprin Imuran rimadil  Advil Cope Indocin Robaxisal  Alka-Seltzer Effervescent Pain Reliever and Antacid Coricidin or Coricidin-D  Indomethacin Rufen    Alka-Seltzer plus Cold Medicine Cosprin Ketoprofen S-A-C Tablets  Anacin Analgesic Tablets or Capsules Coumadin Korlgesic Salflex  Anacin Extra Strength Analgesic tablets or capsules CP-2 Tablets Lanoril Salicylate  Anaprox Cuprimine Capsules Levenox Salocol  Anexsia-D Dalteparin Magan Salsalate  Anodynos Darvon compound Magnesium Salicylate Sine-off  Ansaid Dasin Capsules Magsal Sodium Salicylate  Anturane Depen Capsules Marnal Soma  APF Arthritis pain formula Dewitt's Pills Measurin Stanback  Argesic Dia-Gesic Meclofenamic Sulfinpyrazone  Arthritis Bayer Timed Release Aspirin Diclofenac Meclomen Sulindac  Arthritis pain formula Anacin Dicumarol Medipren Supac  Analgesic (Safety coated) Arthralgen Diffunasal Mefanamic Suprofen  Arthritis Strength Bufferin Dihydrocodeine Mepro Compound Suprol  Arthropan liquid Dopirydamole Methcarbomol with Aspirin Synalgos  ASA tablets/Enseals Disalcid Micrainin Tagament  Ascriptin Doan's Midol Talwin  Ascriptin A/D Dolene Mobidin Tanderil  Ascriptin Extra Strength Dolobid Moblgesic Ticlid  Ascriptin with Codeine Doloprin or Doloprin with Codeine Momentum Tolectin  Asperbuf Duoprin Mono-gesic Trendar  Aspergum Duradyne Motrin or Motrin IB Triminicin  Aspirin  plain, buffered or enteric coated Durasal Myochrisine Trigesic  Aspirin Suppositories Easprin Nalfon Trillsate  Aspirin with Codeine Ecotrin Regular or Extra Strength Naprosyn Uracel  Atromid-S Efficin Naproxen Ursinus  Auranofin Capsules Elmiron Neocylate Vanquish  Axotal Emagrin Norgesic Verin  Azathioprine Empirin or Empirin with Codeine Normiflo Vitamin E  Azolid Emprazil Nuprin Voltaren  Bayer Aspirin plain, buffered or children's or timed BC Tablets or powders Encaprin Orgaran Warfarin Sodium  Buff-a-Comp Enoxaparin Orudis Zorpin  Buff-a-Comp with Codeine Equegesic Os-Cal-Gesic   Buffaprin Excedrin plain, buffered or Extra Strength Oxalid   Bufferin Arthritis Strength Feldene Oxphenbutazone   Bufferin plain or Extra Strength Feldene Capsules Oxycodone with Aspirin   Bufferin with Codeine Fenoprofen Fenoprofen Pabalate or Pabalate-SF   Buffets II Flogesic Panagesic   Buffinol plain or Extra Strength Florinal or Florinal with Codeine Panwarfarin   Buf-Tabs Flurbiprofen Penicillamine   Butalbital Compound Four-way cold tablets Penicillin   Butazolidin Fragmin Pepto-Bismol   Carbenicillin Geminisyn Percodan   Carna Arthritis Reliever Geopen Persantine   Carprofen Gold's salt Persistin   Chloramphenicol Goody's Phenylbutazone   Chloromycetin Haltrain Piroxlcam   Clmetidine heparin Plaquenil   Cllnoril Hyco-pap Ponstel   Clofibrate Hydroxy chloroquine Propoxyphen         Before stopping any of these medications, be sure to consult the physician who ordered them.  Some, such as Coumadin (Warfarin) are ordered to prevent or treat serious conditions such as "deep thrombosis", "pumonary embolisms", and other heart problems.  The amount of time that you may need off of the medication may also vary with the medication and the reason for which you were taking it.  If you are taking any of these medications, please make sure you notify your pain physician before you undergo any  procedures.         Facet Blocks Patient Information  Description: The facets are joints in the spine between the vertebrae.  Like any joints in the body, facets can become irritated and painful.  Arthritis can also effect the facets.  By injecting steroids and local anesthetic in and around these joints, we can temporarily block the nerve supply to them.  Steroids act directly on irritated nerves and tissues to reduce selling and inflammation which often leads to decreased pain.  Facet blocks may be done anywhere along the spine from the neck to the low back depending upon the location of your pain.   After numbing the skin with local anesthetic (like Novocaine), a small needle is passed onto the facet joints under x-ray guidance.    You may experience a sensation of pressure while this is being done.  The entire block usually lasts about 15-25 minutes.   Conditions which may be treated by facet blocks:   Low back/buttock pain  Neck/shoulder pain  Certain types of headaches  Preparation for the injection:  1. Do not eat any solid food or dairy products within 8 hours of your appointment. 2. You may drink clear liquid up to 3 hours before appointment.  Clear liquids include water, black coffee, juice or soda.  No milk or cream please. 3. You may take your regular medication, including pain medications, with a sip of water before your appointment.  Diabetics should hold regular insulin (if taken separately) and take 1/2 normal NPH dose the morning of the procedure.  Carry some sugar containing items with you to your appointment. 4. A driver must accompany you and be prepared to drive you home after your procedure. 5. Bring all your current medications with you. 6. An IV may be inserted and sedation may be given at the discretion of the physician. 7. A blood pressure cuff, EKG and other monitors will often be applied during the procedure.  Some patients may need to have extra oxygen  administered for a short period. 8. You will be asked to provide medical information, including your allergies and medications, prior to the procedure.  We must know immediately if you are taking blood thinners (like Coumadin/Warfarin) or if you are allergic to IV iodine contrast (dye).  We must know if you could possible be pregnant.  Possible side-effects:   Bleeding from needle site  Infection (rare, may require surgery)  Nerve injury (rare)  Numbness & tingling (temporary)  Difficulty urinating (rare, temporary)  Spinal headache (a headache worse with upright posture)  Light-headedness (temporary)  Pain at injection site (serveral days)  Decreased blood pressure (rare, temporary)  Weakness in arm/leg (temporary)  Pressure sensation in back/neck (temporary)   Call if you experience:   Fever/chills associated with headache or increased back/neck pain  Headache worsened by an upright position  New onset, weakness or numbness of an extremity below the injection site  Hives or difficulty breathing (go to the emergency room)  Inflammation or drainage at the injection site(s)  Severe back/neck pain greater than usual  New symptoms which are concerning to you  Please note:  Although the local anesthetic injected can often make your back or neck feel good for several hours after the injection, the pain will likely return. It takes 3-7 days for steroids to work.  You may not notice any pain relief for at least one week.  If effective, we will often do a series of 2-3 injections spaced 3-6 weeks apart to maximally decrease your pain.  After the initial series, you may be a candidate for a more permanent nerve block of the facets.  If you have any questions, please call [phone] [facility] 

## 2016-08-01 ENCOUNTER — Telehealth: Payer: Self-pay

## 2016-08-01 NOTE — Telephone Encounter (Signed)
Called pt to inform her of [employer].  Will mail copy to her.

## 2016-09-16 ENCOUNTER — Encounter: Payer: Self-pay | Admitting: Obstetrics and Gynecology

## 2016-09-16 ENCOUNTER — Ambulatory Visit (INDEPENDENT_AMBULATORY_CARE_PROVIDER_SITE_OTHER): Payer: Medicaid Other | Admitting: Obstetrics and Gynecology

## 2016-09-16 VITALS — BP 128/88 | Ht 65.0 in | Wt 230.0 lb

## 2016-09-16 DIAGNOSIS — N951 Menopausal and female climacteric states: Secondary | ICD-10-CM | POA: Diagnosis not present

## 2016-09-16 MED ORDER — PAROXETINE HCL 40 MG PO TABS: 20.0000 mg | ORAL_TABLET | Freq: Every day | ORAL | 6 refills | Status: DC

## 2016-09-17 NOTE — Progress Notes (Signed)
  History of Present Illness:  Hannah Maynard is a 51 y.o. who was started on an increased dose of Paxil (from 7.5 mg to 20 mg at last visit).   approximately 3 months ago. Since that time, she states that her symptoms are improving.  Her hot flashes are still present, but they have improved.  She has no side effects from the medication. She actually notes that she has had a decrease in her anxiety since starting the medication and this has been a positive effect of the medication.   PMHx: She  has a past medical history of Anxiety; Chronic back pain; and Depression. Also,  has a past surgical history that includes Lipoma excision., family history includes Breast cancer in her paternal aunt; Pancreatic cancer in her father; Stomach cancer in her maternal uncle.,  reports that she has been smoking.  She has never used smokeless tobacco. She reports that she does not drink alcohol or use drugs.  She has a current medication list which includes the following prescription(s): cyclobenzaprine, ibuprofen, zewa tens/ems combo unit, oxycodone, paroxetine, pregabalin, and tramadol, and the following Facility-Administered Medications: iopamidol, lactated ringers, lidocaine (pf), midazolam, ropivacaine (pf) 2 mg/ml (0.2%), sodium chloride flush, and triamcinolone acetonide. Also, is allergic to duloxetine and penicillins.  ROS - per HPI  Physical Exam:  BP 128/88   Ht 5\' 5"  (1.651 m)   Wt 230 lb (104.3 kg)   LMP  (LMP Unknown)   BMI 38.27 kg/m  Body mass index is 38.27 kg/m. Constitutional: Well nourished, well developed female in no acute distress.  Abdomen: diffusely non tender to palpation, non distended, and no masses, hernias Neuro: Grossly intact Psych:  Normal mood and affect.    Assessment:  Problem List Items Addressed This Visit    Climacteric - Primary   Relevant Medications   PARoxetine (PAXIL) 40 MG tablet     Medication treatment is going marginally for her.  Will increase dose per  her wish. Discussed limited treatment options given her smoking status.  Plan: She will undergo increase to 40 mg in her medical therapy. Discussed limited options for treatment. She is already taking Lyrica. So, I do not want to give her gabapentin.  Clonidine is something that may be considered in the future.   She was amenable to this plan and we will see her back in 6 months.  15 minutes spent in face to face discussion with > 50% spent in counseling and management of her symptoms of climacteric.   Prentice Docker, MD  [facility] 09/17/2016  3:17 PM

## 2016-09-25 ENCOUNTER — Ambulatory Visit
Admission: RE | Admit: 2016-09-25 | Discharge: 2016-09-25 | Disposition: A | Discharge status: Home / Self Care | Payer: Medicaid Other | Source: Ambulatory Visit | Attending: Anesthesiology | Admitting: Anesthesiology

## 2016-09-25 ENCOUNTER — Ambulatory Visit (HOSPITAL_BASED_OUTPATIENT_CLINIC_OR_DEPARTMENT_OTHER): Payer: Medicaid Other | Admitting: Anesthesiology

## 2016-09-25 ENCOUNTER — Other Ambulatory Visit: Payer: Self-pay | Admitting: Anesthesiology

## 2016-09-25 ENCOUNTER — Encounter: Payer: Self-pay | Admitting: Anesthesiology

## 2016-09-25 VITALS — BP 142/82 | HR 70 | Temp 98.6°F | Resp 16 | Ht 65.5 in | Wt 232.0 lb

## 2016-09-25 DIAGNOSIS — R52 Pain, unspecified: Secondary | ICD-10-CM

## 2016-09-25 DIAGNOSIS — M4696 Unspecified inflammatory spondylopathy, lumbar region: Secondary | ICD-10-CM

## 2016-09-25 DIAGNOSIS — M5136 Other intervertebral disc degeneration, lumbar region: Secondary | ICD-10-CM | POA: Diagnosis not present

## 2016-09-25 DIAGNOSIS — R29898 Other symptoms and signs involving the musculoskeletal system: Secondary | ICD-10-CM

## 2016-09-25 DIAGNOSIS — M5442 Lumbago with sciatica, left side: Secondary | ICD-10-CM | POA: Diagnosis not present

## 2016-09-25 DIAGNOSIS — M539 Dorsopathy, unspecified: Secondary | ICD-10-CM

## 2016-09-25 DIAGNOSIS — M47816 Spondylosis without myelopathy or radiculopathy, lumbar region: Secondary | ICD-10-CM

## 2016-09-25 DIAGNOSIS — M5386 Other specified dorsopathies, lumbar region: Secondary | ICD-10-CM

## 2016-09-25 DIAGNOSIS — G8929 Other chronic pain: Secondary | ICD-10-CM

## 2016-09-25 MED ORDER — ROPIVACAINE HCL 2 MG/ML IJ SOLN
INTRAMUSCULAR | Status: AC
  Filled 2016-09-25: qty 10

## 2016-09-25 MED ORDER — LIDOCAINE HCL (PF) 1 % IJ SOLN
INTRAMUSCULAR | Status: AC
  Filled 2016-09-25: qty 5

## 2016-09-25 MED ORDER — TRIAMCINOLONE ACETONIDE 40 MG/ML IJ SUSP
80.0000 mg | Freq: Once | INTRAMUSCULAR | Status: AC
  Administered 2016-09-25: 80 mg

## 2016-09-25 MED ORDER — SODIUM CHLORIDE 0.9 % IJ SOLN
INTRAMUSCULAR | Status: AC
  Filled 2016-09-25: qty 10

## 2016-09-25 MED ORDER — ROPIVACAINE HCL 2 MG/ML IJ SOLN
INTRAMUSCULAR | Status: AC
  Filled 2016-09-25: qty 20

## 2016-09-25 MED ORDER — SODIUM CHLORIDE 0.9% FLUSH: 10.0000 mL | Freq: Once | INTRAVENOUS | Status: DC

## 2016-09-25 MED ORDER — OXYCODONE HCL 5 MG PO TABS: 5.0000 mg | ORAL_TABLET | Freq: Three times a day (TID) | ORAL | 0 refills | Status: DC

## 2016-09-25 MED ORDER — FENTANYL CITRATE (PF) 100 MCG/2ML IJ SOLN
INTRAMUSCULAR | Status: AC
  Filled 2016-09-25: qty 2

## 2016-09-25 MED ORDER — MIDAZOLAM HCL 5 MG/5ML IJ SOLN
INTRAMUSCULAR | Status: AC
  Filled 2016-09-25: qty 5

## 2016-09-25 MED ORDER — TRIAMCINOLONE ACETONIDE 40 MG/ML IJ SUSP
INTRAMUSCULAR | Status: AC
  Filled 2016-09-25: qty 2

## 2016-09-25 MED ORDER — TRIAMCINOLONE ACETONIDE 40 MG/ML IJ SUSP
INTRAMUSCULAR | Status: AC
  Filled 2016-09-25: qty 1

## 2016-09-25 MED ORDER — LIDOCAINE HCL (PF) 1 % IJ SOLN
5.0000 mL | Freq: Once | INTRAMUSCULAR | Status: AC
  Administered 2016-09-25: 5 mL via SUBCUTANEOUS

## 2016-09-25 MED ORDER — ROPIVACAINE HCL 2 MG/ML IJ SOLN
20.0000 mL | Freq: Once | INTRAMUSCULAR | Status: AC
  Administered 2016-09-25: 10 mL via EPIDURAL
  Filled 2016-09-25: qty 20

## 2016-09-25 MED ORDER — MIDAZOLAM HCL 5 MG/5ML IJ SOLN
5.0000 mg | Freq: Once | INTRAMUSCULAR | Status: AC
  Administered 2016-09-25: 3 mg via INTRAVENOUS

## 2016-09-25 MED ORDER — IOPAMIDOL (ISOVUE-M 200) INJECTION 41%
INTRAMUSCULAR | Status: AC
  Filled 2016-09-25: qty 10

## 2016-09-25 MED ORDER — LACTATED RINGERS IV SOLN
1000.0000 mL | INTRAVENOUS | Status: DC
  Administered 2016-09-25: 1000 mL via INTRAVENOUS

## 2016-09-25 NOTE — Progress Notes (Signed)
Nursing Pain Medication Assessment:  Safety precautions to be maintained throughout the outpatient stay will include: orient to surroundings, keep bed in low position, maintain call bell within reach at all times, provide assistance with transfer out of bed and ambulation.  Medication Inspection Compliance: Hannah Maynard did not comply with our request to bring her pills to be counted. She was reminded that bringing the medication bottles, even when empty, is a requirement.  Medication: None brought in. Pill/Patch Count: None available to be counted. Bottle Appearance: No container available. Did not bring bottle(s) to appointment. Filled Date: N/A Last Medication intake:  Ran out of medicine more than 48 hours ago   Patient had tooth extraction and dentist would not prescribe additional medications.  Patient had dry socket and ended up taking more pain medication than prescribed by [place] along with ibuprofen that was prescribed by dentist. Patient did not bring empty pill bottle.

## 2016-09-25 NOTE — Patient Instructions (Signed)
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
## 2016-09-26 ENCOUNTER — Telehealth: Payer: Self-pay | Admitting: *Deleted

## 2016-09-26 ENCOUNTER — Telehealth: Payer: Self-pay

## 2016-09-26 NOTE — Telephone Encounter (Signed)
Pt aware is still supposed to take 20mg  qhs.  Was given 40mg  to save $.  Pt understands to cut pill in half and just take half which is 20mg .

## 2016-09-26 NOTE — Telephone Encounter (Signed)
Patient reports improvement from procedure on yesterday.  Instructed in the use of ice to the affected area from bilateral facet blocks.  Patient verbalizes u/o information.

## 2016-09-26 NOTE — Telephone Encounter (Signed)
No problems post procedure. 

## 2016-09-26 NOTE — Telephone Encounter (Signed)
Pt was given rx for paroxitine 40mg  but the directions says cut in half and take 20mg .  Pt thought she was supposed to take 40mg   Doesn't understand.  [phone].  [place].

## 2016-10-01 LAB — TOXASSURE SELECT 13 (MW), URINE

## 2016-10-21 ENCOUNTER — Encounter: Payer: Self-pay | Admitting: Anesthesiology

## 2016-10-21 ENCOUNTER — Other Ambulatory Visit: Payer: Self-pay | Admitting: Anesthesiology

## 2016-10-21 ENCOUNTER — Ambulatory Visit (HOSPITAL_BASED_OUTPATIENT_CLINIC_OR_DEPARTMENT_OTHER): Payer: Medicaid Other | Admitting: Anesthesiology

## 2016-10-21 ENCOUNTER — Ambulatory Visit
Admission: RE | Admit: 2016-10-21 | Discharge: 2016-10-21 | Disposition: A | Discharge status: Home / Self Care | Payer: Medicaid Other | Source: Ambulatory Visit | Attending: Anesthesiology | Admitting: Anesthesiology

## 2016-10-21 VITALS — BP 150/90 | HR 77 | Temp 98.4°F | Resp 16 | Ht 65.0 in | Wt 224.0 lb

## 2016-10-21 DIAGNOSIS — R52 Pain, unspecified: Secondary | ICD-10-CM

## 2016-10-21 DIAGNOSIS — M4696 Unspecified inflammatory spondylopathy, lumbar region: Secondary | ICD-10-CM

## 2016-10-21 DIAGNOSIS — Z803 Family history of malignant neoplasm of breast: Secondary | ICD-10-CM | POA: Insufficient documentation

## 2016-10-21 DIAGNOSIS — M539 Dorsopathy, unspecified: Secondary | ICD-10-CM | POA: Diagnosis not present

## 2016-10-21 DIAGNOSIS — Z9889 Other specified postprocedural states: Secondary | ICD-10-CM | POA: Diagnosis not present

## 2016-10-21 DIAGNOSIS — M47816 Spondylosis without myelopathy or radiculopathy, lumbar region: Secondary | ICD-10-CM

## 2016-10-21 DIAGNOSIS — Z800 Family history of malignant neoplasm of digestive organs: Secondary | ICD-10-CM | POA: Diagnosis not present

## 2016-10-21 DIAGNOSIS — M5136 Other intervertebral disc degeneration, lumbar region: Secondary | ICD-10-CM | POA: Insufficient documentation

## 2016-10-21 DIAGNOSIS — M5431 Sciatica, right side: Secondary | ICD-10-CM | POA: Insufficient documentation

## 2016-10-21 DIAGNOSIS — F419 Anxiety disorder, unspecified: Secondary | ICD-10-CM | POA: Insufficient documentation

## 2016-10-21 DIAGNOSIS — G8929 Other chronic pain: Secondary | ICD-10-CM | POA: Diagnosis not present

## 2016-10-21 DIAGNOSIS — F329 Major depressive disorder, single episode, unspecified: Secondary | ICD-10-CM | POA: Insufficient documentation

## 2016-10-21 DIAGNOSIS — M5386 Other specified dorsopathies, lumbar region: Secondary | ICD-10-CM

## 2016-10-21 DIAGNOSIS — M51369 Other intervertebral disc degeneration, lumbar region without mention of lumbar back pain or lower extremity pain: Secondary | ICD-10-CM

## 2016-10-21 MED ORDER — PREGABALIN 75 MG PO CAPS: 150.0000 mg | ORAL_CAPSULE | Freq: Two times a day (BID) | ORAL | 5 refills | Status: DC

## 2016-10-21 MED ORDER — OXYCODONE HCL 5 MG PO TABS: 5.0000 mg | ORAL_TABLET | Freq: Three times a day (TID) | ORAL | 0 refills | Status: DC

## 2016-10-21 MED ORDER — METHOCARBAMOL 750 MG PO TABS: 750.0000 mg | ORAL_TABLET | Freq: Four times a day (QID) | ORAL | 2 refills | Status: DC

## 2016-10-21 NOTE — Patient Instructions (Signed)

## 2016-10-21 NOTE — Progress Notes (Signed)
Nursing Pain Medication Assessment:  Safety precautions to be maintained throughout the outpatient stay will include: orient to surroundings, keep bed in low position, maintain call bell within reach at all times, provide assistance with transfer out of bed and ambulation.  Medication Inspection Compliance: Pill count conducted under aseptic conditions, in front of the patient. Neither the pills nor the bottle was removed from the patient's sight at any time. Once count was completed pills were immediately returned to the patient in their original bottle.  Medication: Oxycodone IR Pill/Patch Count: 13 of 90 pills remain Pill/Patch Appearance: Markings consistent with prescribed medication Bottle Appearance: Standard pharmacy container. Clearly labeled. Filled Date: 08 / 01 / 2018 Last Medication intake:  Today
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
## 2016-10-29 ENCOUNTER — Telehealth: Payer: Self-pay | Admitting: *Deleted

## 2016-10-30 ENCOUNTER — Telehealth: Payer: Self-pay | Admitting: Anesthesiology

## 2016-10-30 NOTE — Telephone Encounter (Signed)
Prior Hannah Maynard was sent.  Patient notified.

## 2016-10-30 NOTE — Telephone Encounter (Signed)
[facility] calling to check on prior auth so patient can get reimbursed for out of pocket she pain for meds

## 2016-10-31 ENCOUNTER — Telehealth: Payer: Self-pay | Admitting: Anesthesiology

## 2016-10-31 NOTE — Telephone Encounter (Signed)
Patient is having oral surgery on Monday and the dental surgeon will need permission to write script for pain. Please call  [place] [phone]  Also pharmacy wants some one to call them regarding the script filled on 10-26-16 for Lyrica

## 2016-10-31 NOTE — Telephone Encounter (Signed)
Pharmacy notified that PA had been sent.

## 2016-11-01 NOTE — Telephone Encounter (Signed)
Spoke with Chief Financial Officer office.  Patient will be prescribed Percocet 5/325 mg #20 for post op pain. Dr Annamaria Helling will be prescribing.

## 2016-11-01 NOTE — Telephone Encounter (Signed)
Spoke with pharmacist, Juanda Crumble and he states Lyrica was picked up and he was unaware of any problems with prescription.

## 2016-11-01 NOTE — Telephone Encounter (Signed)
Spoke with oral surgeons office.  Receptionist took our office number to have someone call us back regarding the oral surgery to take place on Monday. Will await return call.

## 2016-11-05 NOTE — Telephone Encounter (Signed)
Patient informed that we had sent the prior auth and had back dated it ti 10-26-2016. It is now in the hands of mediciad for approval. Patient instructed to call pharmacy and see if the script will go through.

## 2016-11-05 NOTE — Telephone Encounter (Signed)
Please call patient about script sent on 10-26-16. She had to pay cash, which she borrowed and needs to give back to person she borrowed it from. The only way to do this is to get money back from [place]. But scripts had to be prior auth first.

## 2016-11-12 ENCOUNTER — Telehealth: Payer: Self-pay | Admitting: Anesthesiology

## 2016-11-12 NOTE — Telephone Encounter (Signed)
Left voicemail with patient that PA was sent and she should check with pharmacy.  If she has already done this we can then call [facility] to see what the holdup is.

## 2016-11-12 NOTE — Telephone Encounter (Signed)
Oxycodone needs prior auth back dated to 10-26-16 so she can get refund and repay person she borrowed money from to pick up meds on 10-26-16. Call patient if any questions.

## 2016-11-12 NOTE — Telephone Encounter (Signed)
Hannah Maynard sent PA dated 10/26/16 on 10/30/16 through [place]

## 2016-11-13 ENCOUNTER — Encounter: Payer: Self-pay | Admitting: *Deleted

## 2016-11-13 ENCOUNTER — Telehealth: Payer: Self-pay | Admitting: *Deleted

## 2016-11-13 NOTE — Telephone Encounter (Signed)
PA sent for oxycodone 5 mg and ultram 50 mg along with  1.  Reason for continuation 2.  Plan of care.   11/13/16 Stephan Minister RNC

## 2016-11-13 NOTE — Telephone Encounter (Signed)
Spoke with [place] [facility], will have to submit new PA with 1. Reason for continued use. 2.  Current plan of care.  Once we have that sumbitted we will have to call in order to have the PA backdated to 10/26/16.  Information needed written on previous PA in book.

## 2016-11-15 NOTE — Telephone Encounter (Signed)
PA for oxycodone was backdated from 10-26-16 to last until 04-25-17.  Attempted to call patient but was unable to reach her or leave a message.

## 2016-11-15 NOTE — Telephone Encounter (Signed)
Patient notified that PA had been back dated.

## 2016-11-19 ENCOUNTER — Telehealth: Payer: Self-pay | Admitting: *Deleted

## 2016-11-19 NOTE — Telephone Encounter (Signed)
Patient informed that no refills will be done outside of an appointment. She states she remembered that she has a few pills remaining.

## 2016-11-26 ENCOUNTER — Other Ambulatory Visit: Payer: Self-pay | Admitting: Anesthesiology

## 2016-11-26 ENCOUNTER — Ambulatory Visit (HOSPITAL_BASED_OUTPATIENT_CLINIC_OR_DEPARTMENT_OTHER): Payer: Medicaid Other | Admitting: Anesthesiology

## 2016-11-26 ENCOUNTER — Ambulatory Visit
Admission: RE | Admit: 2016-11-26 | Discharge: 2016-11-26 | Disposition: A | Discharge status: Home / Self Care | Payer: Medicaid Other | Source: Ambulatory Visit | Attending: Anesthesiology | Admitting: Anesthesiology

## 2016-11-26 ENCOUNTER — Encounter: Payer: Self-pay | Admitting: Anesthesiology

## 2016-11-26 VITALS — BP 140/94 | HR 87 | Temp 96.7°F | Resp 16 | Ht 65.0 in | Wt 224.0 lb

## 2016-11-26 DIAGNOSIS — M5386 Other specified dorsopathies, lumbar region: Secondary | ICD-10-CM | POA: Diagnosis not present

## 2016-11-26 DIAGNOSIS — M5431 Sciatica, right side: Secondary | ICD-10-CM

## 2016-11-26 DIAGNOSIS — M51369 Other intervertebral disc degeneration, lumbar region without mention of lumbar back pain or lower extremity pain: Secondary | ICD-10-CM

## 2016-11-26 DIAGNOSIS — R52 Pain, unspecified: Secondary | ICD-10-CM

## 2016-11-26 DIAGNOSIS — R29898 Other symptoms and signs involving the musculoskeletal system: Secondary | ICD-10-CM

## 2016-11-26 DIAGNOSIS — M5442 Lumbago with sciatica, left side: Secondary | ICD-10-CM

## 2016-11-26 DIAGNOSIS — M47816 Spondylosis without myelopathy or radiculopathy, lumbar region: Secondary | ICD-10-CM

## 2016-11-26 DIAGNOSIS — M4696 Unspecified inflammatory spondylopathy, lumbar region: Secondary | ICD-10-CM | POA: Diagnosis not present

## 2016-11-26 DIAGNOSIS — M5136 Other intervertebral disc degeneration, lumbar region: Secondary | ICD-10-CM

## 2016-11-26 DIAGNOSIS — G8929 Other chronic pain: Secondary | ICD-10-CM

## 2016-11-26 MED ORDER — LACTATED RINGERS IV SOLN
1000.0000 mL | INTRAVENOUS | Status: DC
  Administered 2016-11-26: 1000 mL via INTRAVENOUS

## 2016-11-26 MED ORDER — MIDAZOLAM HCL 5 MG/5ML IJ SOLN
5.0000 mg | Freq: Once | INTRAMUSCULAR | Status: DC
  Filled 2016-11-26: qty 5

## 2016-11-26 MED ORDER — PREGABALIN 75 MG PO CAPS: 150.0000 mg | ORAL_CAPSULE | Freq: Two times a day (BID) | ORAL | 5 refills | Status: DC

## 2016-11-26 MED ORDER — SODIUM CHLORIDE 0.9 % IJ SOLN
INTRAMUSCULAR | Status: AC
  Filled 2016-11-26: qty 10

## 2016-11-26 MED ORDER — IOPAMIDOL (ISOVUE-M 200) INJECTION 41%
INTRAMUSCULAR | Status: AC
  Filled 2016-11-26: qty 10

## 2016-11-26 MED ORDER — SODIUM CHLORIDE 0.9% FLUSH
10.0000 mL | Freq: Once | INTRAVENOUS | Status: AC
  Administered 2016-11-26: 10 mL

## 2016-11-26 MED ORDER — LIDOCAINE HCL (PF) 1 % IJ SOLN
5.0000 mL | Freq: Once | INTRAMUSCULAR | Status: AC
  Administered 2016-11-26: 5 mL via SUBCUTANEOUS
  Filled 2016-11-26: qty 5

## 2016-11-26 MED ORDER — ROPIVACAINE HCL 2 MG/ML IJ SOLN
10.0000 mL | Freq: Once | INTRAMUSCULAR | Status: AC
  Administered 2016-11-26: 10 mL via EPIDURAL
  Filled 2016-11-26: qty 10

## 2016-11-26 MED ORDER — TRIAMCINOLONE ACETONIDE 40 MG/ML IJ SUSP
40.0000 mg | Freq: Once | INTRAMUSCULAR | Status: AC
Start: 2016-11-26 — End: 2016-11-26
  Administered 2016-11-26: 40 mg
  Filled 2016-11-26: qty 1

## 2016-11-26 MED ORDER — OXYCODONE HCL 5 MG PO TABS: 5.0000 mg | ORAL_TABLET | Freq: Three times a day (TID) | ORAL | 0 refills | Status: DC

## 2016-11-26 MED ORDER — IOPAMIDOL (ISOVUE-M 200) INJECTION 41%
20.0000 mL | Freq: Once | INTRAMUSCULAR | Status: DC | PRN
  Administered 2016-11-26: 10 mL
  Filled 2016-11-26: qty 20

## 2016-11-26 NOTE — Patient Instructions (Signed)
Pain Management Discharge Instructions  General Discharge Instructions :  If you need to reach your doctor call: Monday-Friday 8:00 am - 4:00 pm at [phone] or toll free [phone].  After clinic hours [phone] to have operator reach doctor.  Bring all of your medication bottles to all your appointments in the pain clinic.  To cancel or reschedule your appointment with Pain Management please remember to call 24 hours in advance to avoid a fee.  Refer to the educational materials which you have been given on: General Risks, I had my Procedure. Discharge Instructions, Post Sedation.  Post Procedure Instructions:  The drugs you were given will stay in your system until tomorrow, so for the next 24 hours you should not drive, make any legal decisions or drink any alcoholic beverages.  You may eat anything you prefer, but it is better to start with liquids then soups and crackers, and gradually work up to solid foods.  Please notify your doctor immediately if you have any unusual bleeding, trouble breathing or pain that is not related to your normal pain.  Depending on the type of procedure that was done, some parts of your body may feel week and/or numb.  This usually clears up by tonight or the next day.  Walk with the use of an assistive device or accompanied by an adult for the 24 hours.  You may use ice on the affected area for the first 24 hours.  Put ice in a Ziploc bag and cover with a towel and place against area 15 minutes on 15 minutes off.  You may switch to heat after 24 hours.Epidural Steroid Injection An epidural steroid injection is a shot of steroid medicine and numbing medicine that is given into the space between the spinal cord and the bones in your back (epidural space). The shot helps relieve pain caused by an irritated or swollen nerve root. The amount of pain relief you get from the injection depends on what is causing the nerve to be swollen and irritated,  and how long your pain lasts. You are more likely to benefit from this injection if your pain is strong and comes on suddenly rather than if you have had pain for a long time. Tell a health care provider about:  Any allergies you have.  All medicines you are taking, including vitamins, herbs, eye drops, creams, and over-the-counter medicines.  Any problems you or family members have had with anesthetic medicines.  Any blood disorders you have.  Any surgeries you have had.  Any medical conditions you have.  Whether you are pregnant or may be pregnant. What are the risks? Generally, this is a safe procedure. However, problems may occur, including:  Headache.  Bleeding.  Infection.  Allergic reaction to medicines.  Damage to your nerves.  What happens before the procedure? Staying hydrated Follow instructions from your health care provider about hydration, which may include:  Up to 2 hours before the procedure - you may continue to drink clear liquids, such as water, clear fruit juice, black coffee, and plain tea.  Eating and drinking restrictions Follow instructions from your health care provider about eating and drinking, which may include:  8 hours before the procedure - stop eating heavy meals or foods such as meat, fried foods, or fatty foods.  6 hours before the procedure - stop eating light meals or foods, such as toast or cereal.  6 hours before the procedure - stop drinking milk or drinks that contain milk.    2 hours before the procedure - stop drinking clear liquids. Medicine  You may be given medicines to lower anxiety.  Ask your health care provider about:  Changing or stopping your regular medicines. This is especially important if you are taking diabetes medicines or blood thinners.  Taking medicines such as aspirin and ibuprofen. These medicines can thin your blood. Do not take these medicines before your procedure if your health care provider instructs  you not to. General instructions  Plan to have someone take you home from the hospital or clinic. What happens during the procedure?  You may receive a medicine to help you relax (sedative).  You will be asked to lie on your abdomen.  The injection site will be cleaned.  A numbing medicine (local anesthetic) will be used to numb the injection site.  A needle will be inserted through your skin into the epidural space. You may feel some discomfort when this happens. An X-ray machine will be used to make sure the needle is put as close as possible to the affected nerve.  A steroid medicine and a local anesthetic will be injected into the epidural space.  The needle will be removed.  A bandage (dressing) will be put over the injection site. What happens after the procedure?  Your blood pressure, heart rate, breathing rate, and blood oxygen level will be monitored until the medicines you were given have worn off.  Your arm or leg may feel weak or numb for a few hours.  The injection site may feel sore.  Do not drive for 24 hours if you received a sedative. This information is not intended to replace advice given to you by your health care provider. Make sure you discuss any questions you have with your health care provider. Document Released: 05/21/2007 Document Revised: 07/26/2015 Document Reviewed: 05/30/2015 Elsevier Interactive Patient Education  2017 [employer]  

## 2016-11-26 NOTE — Progress Notes (Signed)
Subjective:  Patient ID: Hannah Maynard, female    DOB: Oct 13, 1965  Age: 51 y.o. MRN: [id number]  CC: Back Pain (lower) and Leg Pain (Top of left leg and all of right leg)   Procedure: L4-L5 epidural steroid under fluoroscopic guidance with moderate sedation   HPI [name] presents for reevaluation. She was last seen back in August which she was here for medication renewal. Since that time she's had some more pain over the right anterior thigh. She has chronic left leg pain. She's also had some chronic lower extremity weakness. She has been seen by neurosurgeons who have given her the option for surgical decompression. At this point she's gone through a series of epidural steroids 2 and previous facet blocks. She feels that the epidural steroids helped her the most. She is trying to do her stretching strengthening exercises on a regular basis. She's also taking her oxycodone 5 mg strength tablets up to 3 times a day. These continue to give her some relief as reviewed with her narcotic assessment sheet. No change in her lower extremity strength or function or bowel bladder function is noted that this time. She desires to proceed with a repeat epidural injection which would be her third in the series. Her last epidural was in April.  Outpatient Medications Prior to Visit  Medication Sig Dispense Refill  . ibuprofen (ADVIL,MOTRIN) 200 MG tablet Take 200 mg by mouth every 6 (six) hours as needed.    . methocarbamol (ROBAXIN) 750 MG tablet Take 1 tablet (750 mg total) by mouth 4 (four) times daily. 90 tablet 2  . Nerve Stimulator (ZEWA TENS/EMS COMBO UNIT) DEVI by Does not apply route.    Marland Kitchen PARoxetine (PAXIL) 40 MG tablet Take 0.5 tablets (20 mg total) by mouth at bedtime. 30 tablet 6  . traMADol (ULTRAM) 50 MG tablet Take 1 tablet (50 mg total) by mouth 2 (two) times daily. 60 tablet 5  . oxyCODONE (OXY IR/ROXICODONE) 5 MG immediate release tablet Take 1 tablet (5 mg total) by mouth 3  (three) times daily with meals. 90 tablet 0  . pregabalin (LYRICA) 75 MG capsule Take 2 capsules (150 mg total) by mouth 2 (two) times daily. 120 capsule 5   Facility-Administered Medications Prior to Visit  Medication Dose Route Frequency Provider Last Rate Last Dose  . iopamidol (ISOVUE-M) 41 % intrathecal injection 20 mL  20 mL Other Once PRN Molli Barrows, MD      . lactated ringers infusion 1,000 mL  1,000 mL Intravenous Continuous Molli Barrows, MD      . lidocaine (PF) (XYLOCAINE) 1 % injection 5 mL  5 mL Subcutaneous Once Molli Barrows, MD      . midazolam (VERSED) injection 5 mg  5 mg Intravenous Once Molli Barrows, MD      . ropivacaine (PF) 2 mg/mL (0.2%) (NAROPIN) injection 10 mL  10 mL Epidural Once Molli Barrows, MD      . sodium chloride flush (NS) 0.9 % injection 10 mL  10 mL Other Once Molli Barrows, MD      . triamcinolone acetonide (KENALOG-40) injection 40 mg  40 mg Other Once Molli Barrows, MD        Review of Systems CNS: No sedation or confusion Cardiac: No angina or palpitations GI: No constipation or abdominal pain  Objective:  BP (!) 148/112   Pulse 87   Temp 98.1 F (36.7 C) (Oral)   Resp  15   Ht 5\' 5"  (1.651 m)   Wt 224 lb (101.6 kg)   LMP  (LMP Unknown)   SpO2 97%   BMI 37.28 kg/m    BP Readings from Last 3 Encounters:  11/26/16 (!) 148/112  10/21/16 (!) 150/90  09/25/16 (!) 142/82     Wt Readings from Last 3 Encounters:  11/26/16 224 lb (101.6 kg)  10/21/16 224 lb (101.6 kg)  09/25/16 232 lb (105.2 kg)     Physical Exam Pt is alert and oriented PERRL EOMI HEART IS RRR no murmur or rub LCTA no wheezing or rhales MUSCULOSKELETALSome persistent paraspinous muscle tenderness but no overt trigger points. She has a positive straight leg raise on the right side her muscle tone and bulk is good no change in strength is noted from her initial evaluation. This seems to be stable in nature.  Labs  No results found for: HGBA1C No  results found for: GLUF, MICROALBUR, LDLCALC, CREATININE  -------------------------------------------------------------------------------------------------------------------- No results found for: WBC, HGB, HCT, PLT, GLUCOSE, CHOL, TRIG, HDL, LDLDIRECT, LDLCALC, ALT, AST, NA, K, CL, CREATININE, BUN, CO2, TSH, PSA, INR, GLUF, HGBA1C, MICROALBUR  --------------------------------------------------------------------------------------------------------------------- No results found.   Assessment & Plan:   [name] was seen today for back pain and leg pain.  Diagnoses and all orders for this visit:  Facet arthritis of lumbar region [facility])  DDD (degenerative disc disease), lumbar  Sciatica of right side associated with disorder of lumbar spine -     Lumbar Epidural Injection  Complaints of weakness of lower extremity  Chronic bilateral low back pain with left-sided sciatica  Chronic sciatica, right -     triamcinolone acetonide (KENALOG-40) injection 40 mg; 1 mL (40 mg total) by Other route once. -     sodium chloride flush (NS) 0.9 % injection 10 mL; 10 mLs by Other route once. -     ropivacaine (PF) 2 mg/mL (0.2%) (NAROPIN) injection 10 mL; 10 mLs by Epidural route once. -     midazolam (VERSED) 5 MG/5ML injection 5 mg; Inject 5 mLs (5 mg total) into the vein once. -     lidocaine (PF) (XYLOCAINE) 1 % injection 5 mL; Inject 5 mLs into the skin once. -     lactated ringers infusion 1,000 mL; Inject 1,000 mLs into the vein continuous. -     iopamidol (ISOVUE-M) 41 % intrathecal injection 20 mL; 20 mLs by Other route once as needed for contrast.  Other orders -     pregabalin (LYRICA) 75 MG capsule; Take 2 capsules (150 mg total) by mouth 2 (two) times daily. -     oxyCODONE (OXY IR/ROXICODONE) 5 MG immediate release tablet; Take 1 tablet (5 mg total) by mouth 3 (three) times daily with  meals.        ----------------------------------------------------------------------------------------------------------------------  Problem List Items Addressed This Visit    None    Visit Diagnoses    Facet arthritis of lumbar region [facility])    -  Primary   Relevant Medications   oxyCODONE (OXY IR/ROXICODONE) 5 MG immediate release tablet   triamcinolone acetonide (KENALOG-40) injection 40 mg (Completed)   DDD (degenerative disc disease), lumbar       Relevant Medications   oxyCODONE (OXY IR/ROXICODONE) 5 MG immediate release tablet   triamcinolone acetonide (KENALOG-40) injection 40 mg (Completed)   Sciatica of right side associated with disorder of lumbar spine       Relevant Medications   pregabalin (LYRICA) 75 MG capsule   midazolam (VERSED)  5 MG/5ML injection 5 mg   Other Relevant Orders   Lumbar Epidural Injection   Complaints of weakness of lower extremity       Chronic bilateral low back pain with left-sided sciatica       Relevant Medications   pregabalin (LYRICA) 75 MG capsule   oxyCODONE (OXY IR/ROXICODONE) 5 MG immediate release tablet   triamcinolone acetonide (KENALOG-40) injection 40 mg (Completed)   midazolam (VERSED) 5 MG/5ML injection 5 mg   Chronic sciatica, right       Relevant Medications   pregabalin (LYRICA) 75 MG capsule   triamcinolone acetonide (KENALOG-40) injection 40 mg (Completed)   sodium chloride flush (NS) 0.9 % injection 10 mL (Completed)   ropivacaine (PF) 2 mg/mL (0.2%) (NAROPIN) injection 10 mL (Completed)   midazolam (VERSED) 5 MG/5ML injection 5 mg   lidocaine (PF) (XYLOCAINE) 1 % injection 5 mL (Completed)   lactated ringers infusion 1,000 mL   iopamidol (ISOVUE-M) 41 % intrathecal injection 20 mL        ----------------------------------------------------------------------------------------------------------------------  1. Facet arthritis of lumbar region [facility]) We'll defer on facet injection today  2. DDD (degenerative  disc disease), lumbar   3. Sciatica of right side associated with disorder of lumbar spine We'll proceed with a repeat lumbar epidural steroid injection which will be her third in series. I went to do this at the L3-4 or L4-5 interspace. We'll continue with efforts at weight loss stretching strengthening with return to clinic in 6 weeks for reevaluation. We did talk about possible referral back to her neurosurgeon for consideration for decompressive surgery. - Lumbar Epidural Injection  4. Complaints of weakness of lower extremity At baseline continue with exercises and should this change we will consider repeat neurosurgical decompression evaluation.  5. Chronic bilateral low back pain with left-sided sciatica We'll refill her medications today for October 1 for her oxycodone. We have reviewed the [facility] practitioner database information and it is appropriate.  6. Chronic sciatica, right  - triamcinolone acetonide (KENALOG-40) injection 40 mg; 1 mL (40 mg total) by Other route once. - sodium chloride flush (NS) 0.9 % injection 10 mL; 10 mLs by Other route once. - ropivacaine (PF) 2 mg/mL (0.2%) (NAROPIN) injection 10 mL; 10 mLs by Epidural route once. - midazolam (VERSED) 5 MG/5ML injection 5 mg; Inject 5 mLs (5 mg total) into the vein once. - lidocaine (PF) (XYLOCAINE) 1 % injection 5 mL; Inject 5 mLs into the skin once. - lactated ringers infusion 1,000 mL; Inject 1,000 mLs into the vein continuous. - iopamidol (ISOVUE-M) 41 % intrathecal injection 20 mL; 20 mLs by Other route once as needed for contrast.    ----------------------------------------------------------------------------------------------------------------------  I am having Ms. Staggs maintain her ibuprofen, ZEWA TENS/EMS COMBO UNIT, traMADol, PARoxetine, methocarbamol, pregabalin, and oxyCODONE. We administered triamcinolone acetonide, sodium chloride flush, ropivacaine (PF) 2 mg/mL (0.2%), lidocaine (PF),  lactated ringers, and iopamidol. We will continue to administer triamcinolone acetonide, sodium chloride flush, ropivacaine (PF) 2 mg/mL (0.2%), midazolam, lidocaine (PF), lactated ringers, and iopamidol.   Meds ordered this encounter  Medications  . pregabalin (LYRICA) 75 MG capsule    Sig: Take 2 capsules (150 mg total) by mouth 2 (two) times daily.    Dispense:  120 capsule    Refill:  5  . oxyCODONE (OXY IR/ROXICODONE) 5 MG immediate release tablet    Sig: Take 1 tablet (5 mg total) by mouth 3 (three) times daily with meals.    Dispense:  90 tablet  Refill:  0  . triamcinolone acetonide (KENALOG-40) injection 40 mg  . sodium chloride flush (NS) 0.9 % injection 10 mL  . ropivacaine (PF) 2 mg/mL (0.2%) (NAROPIN) injection 10 mL  . midazolam (VERSED) 5 MG/5ML injection 5 mg  . lidocaine (PF) (XYLOCAINE) 1 % injection 5 mL  . lactated ringers infusion 1,000 mL  . iopamidol (ISOVUE-M) 41 % intrathecal injection 20 mL   Patient's Medications  New Prescriptions   No medications on file  Previous Medications   IBUPROFEN (ADVIL,MOTRIN) 200 MG TABLET    Take 200 mg by mouth every 6 (six) hours as needed.   METHOCARBAMOL (ROBAXIN) 750 MG TABLET    Take 1 tablet (750 mg total) by mouth 4 (four) times daily.   NERVE STIMULATOR (ZEWA TENS/EMS COMBO UNIT) DEVI    by Does not apply route.   PAROXETINE (PAXIL) 40 MG TABLET    Take 0.5 tablets (20 mg total) by mouth at bedtime.   TRAMADOL (ULTRAM) 50 MG TABLET    Take 1 tablet (50 mg total) by mouth 2 (two) times daily.  Modified Medications   Modified Medication Previous Medication   OXYCODONE (OXY IR/ROXICODONE) 5 MG IMMEDIATE RELEASE TABLET oxyCODONE (OXY IR/ROXICODONE) 5 MG immediate release tablet      Take 1 tablet (5 mg total) by mouth 3 (three) times daily with meals.    Take 1 tablet (5 mg total) by mouth 3 (three) times daily with meals.   PREGABALIN (LYRICA) 75 MG CAPSULE pregabalin (LYRICA) 75 MG capsule      Take 2 capsules (150  mg total) by mouth 2 (two) times daily.    Take 2 capsules (150 mg total) by mouth 2 (two) times daily.  Discontinued Medications   No medications on file   ---------------------------------------------------------------------------------------------------------------------- Lumbar epidural steroid at L4-5 under fluoroscopic guidance with moderate sedation Procedure: L4-L5 LESI with fluoroscopic guidance and moderate sedation  NOTE: The risks, benefits, and expectations of the procedure have been discussed and explained to the patient who was understanding and in agreement with suggested treatment plan. No guarantees were made.  DESCRIPTION OF PROCEDURE: Lumbar epidural steroid injection with IV Versed, EKG, blood pressure, pulse, and pulse oximetry monitoring. The procedure was performed with the patient in the prone position under fluoroscopic guidance. I injected subcutaneous lidocaine overlying the L3-4 site after its fluoroscopic identifictation.  Using strict aseptic technique, I then advanced an 18-gauge Tuohy epidural needle in the midline using interlaminar approach via loss-of-resistance to saline technique. There was negative aspiration for heme or  CSF.  I then confirmed position with both AP and Lateral fluoroscan. 2 cc of Isovue were injected but this yielded an unsatisfactory epidural spread. I chose to repeat the procedure at the L4-5 interspace as above. This was without difficulty healing easy placement at a depth approximate 7 cm. 2 cc of Isovue yielding good epidural spread at that level this was followed by injection of a total of 5 mL of Preservative-Free normal saline mixed with 40 mg of Kenalog and 1cc Ropicaine 0.2 percent were injected incrementally via the  epidurally placed needle. The needle was removed. The patient tolerated the injection well and was convalesced and discharged to home in stable condition. Should the patient have any post procedure difficulty they have been  instructed on how to contact us for assistance.     Follow-up: Return in about 6 weeks (around 01/07/2017) for evaluation.    Molli Barrows, MD

## 2016-11-26 NOTE — Progress Notes (Signed)
Nursing Pain Medication Assessment:  Safety precautions to be maintained throughout the outpatient stay will include: orient to surroundings, keep bed in low position, maintain call bell within reach at all times, provide assistance with transfer out of bed and ambulation.  Medication Inspection Compliance: Pill count conducted under aseptic conditions, in front of the patient. Neither the pills nor the bottle was removed from the patient's sight at any time. Once count was completed pills were immediately returned to the patient in their original bottle.  Medication: Oxycodone IR Pill/Patch Count: 0 of 90 pills remain Pill/Patch Appearance: no pills Bottle Appearance: Standard pharmacy container. Clearly labeled. Filled Date: 09 / 01 / 2018 Last Medication intake:  Yesterday

## 2016-11-27 ENCOUNTER — Telehealth: Payer: Self-pay | Admitting: *Deleted

## 2016-11-27 NOTE — Telephone Encounter (Signed)
No problems post procedure. 

## 2016-12-25 ENCOUNTER — Ambulatory Visit: Payer: Medicaid Other | Admitting: Physical Therapy

## 2016-12-26 ENCOUNTER — Ambulatory Visit: Payer: Medicaid Other | Attending: Nurse Practitioner | Admitting: Nurse Practitioner

## 2016-12-26 ENCOUNTER — Encounter: Payer: Self-pay | Admitting: Nurse Practitioner

## 2016-12-26 ENCOUNTER — Telehealth: Payer: Self-pay

## 2016-12-26 DIAGNOSIS — M545 Low back pain, unspecified: Secondary | ICD-10-CM | POA: Insufficient documentation

## 2016-12-26 DIAGNOSIS — G8929 Other chronic pain: Secondary | ICD-10-CM | POA: Diagnosis not present

## 2016-12-26 DIAGNOSIS — M542 Cervicalgia: Secondary | ICD-10-CM

## 2016-12-26 DIAGNOSIS — Z6837 Body mass index (BMI) 37.0-37.9, adult: Secondary | ICD-10-CM | POA: Diagnosis not present

## 2016-12-26 DIAGNOSIS — Z780 Asymptomatic menopausal state: Secondary | ICD-10-CM | POA: Insufficient documentation

## 2016-12-26 DIAGNOSIS — E669 Obesity, unspecified: Secondary | ICD-10-CM | POA: Diagnosis not present

## 2016-12-26 DIAGNOSIS — M79605 Pain in left leg: Secondary | ICD-10-CM | POA: Diagnosis present

## 2016-12-26 DIAGNOSIS — G894 Chronic pain syndrome: Secondary | ICD-10-CM | POA: Diagnosis not present

## 2016-12-26 DIAGNOSIS — M79601 Pain in right arm: Secondary | ICD-10-CM

## 2016-12-26 DIAGNOSIS — M79641 Pain in right hand: Secondary | ICD-10-CM | POA: Insufficient documentation

## 2016-12-26 DIAGNOSIS — M79604 Pain in right leg: Secondary | ICD-10-CM | POA: Insufficient documentation

## 2016-12-26 DIAGNOSIS — M79642 Pain in left hand: Secondary | ICD-10-CM | POA: Diagnosis not present

## 2016-12-26 DIAGNOSIS — M5442 Lumbago with sciatica, left side: Secondary | ICD-10-CM | POA: Diagnosis not present

## 2016-12-26 DIAGNOSIS — Z79891 Long term (current) use of opiate analgesic: Secondary | ICD-10-CM | POA: Insufficient documentation

## 2016-12-26 DIAGNOSIS — M79603 Pain in arm, unspecified: Secondary | ICD-10-CM

## 2016-12-26 DIAGNOSIS — M5441 Lumbago with sciatica, right side: Secondary | ICD-10-CM

## 2016-12-26 DIAGNOSIS — M47816 Spondylosis without myelopathy or radiculopathy, lumbar region: Secondary | ICD-10-CM | POA: Diagnosis not present

## 2016-12-26 DIAGNOSIS — M79602 Pain in left arm: Secondary | ICD-10-CM

## 2016-12-26 MED ORDER — OXYCODONE HCL 5 MG PO TABS: 5.0000 mg | ORAL_TABLET | Freq: Three times a day (TID) | ORAL | 0 refills | Status: DC

## 2016-12-26 MED ORDER — TRAMADOL HCL ER 100 MG PO TB24: 100.0000 mg | ORAL_TABLET | Freq: Every day | ORAL | 0 refills | Status: AC

## 2016-12-26 NOTE — Progress Notes (Signed)
Nursing Pain Medication Assessment:  Safety precautions to be maintained throughout the outpatient stay will include: orient to surroundings, keep bed in low position, maintain call bell within reach at all times, provide assistance with transfer out of bed and ambulation.  Medication Inspection Compliance: Pill count conducted under aseptic conditions, in front of the patient. Neither the pills nor the bottle was removed from the patient's sight at any time. Once count was completed pills were immediately returned to the patient in their original bottle.  Medication: Oxycodone IR Pill/Patch Count: 0 of 90 pills remain Pill/Patch Appearance: Markings consistent with prescribed medication Bottle Appearance: Standard pharmacy container. Clearly labeled. Filled Date: 10/02 / 2018 Last Medication intake:  Today 

## 2016-12-26 NOTE — Progress Notes (Signed)
Patient's Name: Hannah Maynard  MRN: [id number]  Referring Provider: Nathaneil Canary, New Maynard*  DOB: 02-11-1966  PCP: Nathaneil Canary, PA-C  DOS: 12/26/2016  Note by: Vevelyn Francois NP  Service setting: Ambulatory outpatient  Specialty: Interventional Pain Management  Location: [facility] (AMB) Pain Management Facility    Patient type: Established    Primary Reason(s) for Visit: Encounter for prescription drug management & post-procedure evaluation of chronic illness with mild to moderate exacerbation(Level of risk: moderate) CC: Back Pain (lower back) and Leg Pain (bilateral)  HPI  Hannah Maynard is a 51 y.o. year old, female patient, who comes today for a post-procedure evaluation and medication management. She has Lumbar spondylosis; Obesity; Tobacco use; Right-sided low back pain with right-sided sciatica; Hot flushes, perimenopausal; Climacteric; Chronic low back pain; Chronic upper extremity pain; Chronic pain syndrome; and Chronic neck pain on her problem list. Her primarily concern today is the Back Pain (lower back) and Leg Pain (bilateral)  Pain Assessment: Location: Lower, Left, Right Back Radiating: down both legs and down to feet Onset: More than a month ago Duration: Chronic pain Quality: Burning, Aching, Numbness Severity: 7 /10 (self-reported pain score)  Note: Reported level is compatible with observation. Clinically the patient looks like a 3/10       Information on the proper use of the pain scale provided to the patient today. When using our objective Pain Scale, levels between 6 and 10/10 are said to belong in an emergency room, as it progressively worsens from a 6/10, described as severely limiting, requiring emergency care not usually available at an outpatient pain management facility. At a 6/10 level, communication becomes difficult and requires great effort. Assistance to reach the emergency department may be required. Facial flushing and profuse sweating along with  potentially dangerous increases in heart rate and blood pressure will be evident. Effect on ADL: patient is unable to achieve daily activities. Timing: Constant Modifying factors: procedure helps for approx 2 weeks, pain medication  [name] was last seen on Visit date not found for a procedure. During today's appointment we reviewed Ms. Mccampbell's post-procedure results, as well as her outpatient medication regimen. She admits that the LESI was not effective; further discussion below. She states that she does not desire surgery at this time. She feels like the change in her regimen with the discontinuing of the Tramadol has been significant. She admits that she is having increased numbness and tingling in her right arm. She admits that she has neck pain on both sides. She denies any images or evaluation .  Further details on both, my assessment(s), as well as the proposed treatment plan, please see below.  Controlled Substance Pharmacotherapy Assessment REMS (Risk Evaluation and Mitigation Strategy)  Janett Billow, RN  12/26/2016  3:19 PM  Sign at close encounter Nursing Pain Medication Assessment:  Safety precautions to be maintained throughout the outpatient stay will include: orient to surroundings, keep bed in low position, maintain call bell within reach at all times, provide assistance with transfer out of bed and ambulation.  Medication Inspection Compliance: Pill count conducted under aseptic conditions, in front of the patient. Neither the pills nor the bottle was removed from the patient's sight at any time. Once count was completed pills were immediately returned to the patient in their original bottle.  Medication: Oxycodone IR Pill/Patch Count: 0 of 90 pills remain Pill/Patch Appearance: Markings consistent with prescribed medication Bottle Appearance: Standard pharmacy container. Clearly labeled. Filled Date: 60 / 02 /  2018 Last Medication intake:  Today    Pharmacokinetics: Liberation and absorption (onset of action): WNL Distribution (time to peak effect): WNL Metabolism and excretion (duration of action): WNL         Pharmacodynamics: Desired effects: Analgesia: Hannah Maynard reports >50% benefit. Functional ability: Patient reports that medication allows her to accomplish basic ADLs Clinically meaningful improvement in function (CMIF): Sustained CMIF goals met Perceived effectiveness: Described as relatively effective, allowing for increase in activities of daily living (ADL) Undesirable effects: Side-effects or Adverse reactions: None reported Monitoring: [place] PMP: Online review of the past 25-monthperiod conducted. Compliant with practice rules and regulations Last UDS on record: Summary  Date Value Ref Range Status  09/25/2016 FINAL  Final    Comment:    ==================================================================== TOXASSURE SELECT 13 (MW) ==================================================================== Test                             Result       Flag       Units Drug Absent but Declared for Prescription Verification   Oxycodone                      Not Detected UNEXPECTED ng/mg creat   Tramadol                       Not Detected UNEXPECTED ==================================================================== Test                      Result    Flag   Units      Ref Range   Creatinine              216              mg/dL      >=20 ==================================================================== Declared Medications:  The flagging and interpretation on this report are based on the  following declared medications.  Unexpected results may arise from  inaccuracies in the declared medications.  **Note: The testing scope of this panel includes these medications:  Oxycodone  Tramadol (Ultram)  **Note: The testing scope of this panel does not include following  reported medications:  Cyclobenzaprine (Flexeril)   Ibuprofen  Paroxetine (Paxil)  Pregabalin (Lyrica) ==================================================================== For clinical consultation, please call ([phone] ====================================================================    UDS interpretation: Compliant          Medication Assessment Form: Reviewed. Patient indicates being compliant with therapy Treatment compliance: Compliant Risk Assessment Profile: Aberrant behavior: See prior evaluations. None observed or detected today Comorbid factors increasing risk of overdose: See prior notes. No additional risks detected today Risk of substance use disorder (SUD): Low     Opioid Risk Tool - 11/26/16 1224      Family History of Substance Abuse   Alcohol Positive Female   Illegal Drugs Negative   Rx Drugs Negative     Personal History of Substance Abuse   Alcohol Negative   Illegal Drugs Negative   Rx Drugs Negative     Age   Age between 132-45years  No     History of Preadolescent Sexual Abuse   History of Preadolescent Sexual Abuse Negative or Female     Psychological Disease   Psychological Disease Negative   Depression Positive     Total Score   Opioid Risk Tool Scoring 2   Opioid Risk Interpretation Low Risk     ORT Scoring interpretation table:  Score <3 = Low Risk for SUD  Score between 4-7 = Moderate Risk for SUD  Score >8 = High Risk for Opioid Abuse   Risk Mitigation Strategies:  Patient Counseling: Covered Patient-Prescriber Agreement (PPA): Present and active  Notification to other healthcare providers: Done  Pharmacologic Plan: No change in therapy, at this time  Post-Procedure Assessment   Procedure: 11/26/16 LESI Pre-procedure pain score:        /10 Post-procedure pain score: 0/10         Influential Factors: BMI: 37.94 kg/m Intra-procedural challenges: None observed.         Assessment challenges: None detected.              Reported side-effects: None.         Post-procedural adverse reactions or complications: None reported         Sedation: Please see nurses note. When no sedatives are used, the analgesic levels obtained are directly associated to the effectiveness of the local anesthetics. However, when sedation is provided, the level of analgesia obtained during the initial 1 hour following the intervention, is believed to be the result of a combination of factors. These factors may include, but are not limited to: 1. The effectiveness of the local anesthetics used. 2. The effects of the analgesic(s) and/or anxiolytic(s) used. 3. The degree of discomfort experienced by the patient at the time of the procedure. 4. The patients ability and reliability in recalling and recording the events. 5. The presence and influence of possible secondary gains and/or psychosocial factors. Reported result: Relief experienced during the 1st hour after the procedure: 100 % (Ultra-Short Term Relief)            Interpretative annotation: Clinically appropriate result. Analgesia during this period is likely to be Local Anesthetic and/or IV Sedative (Analgesic/Anxiolytic) related.          Effects of local anesthetic: The analgesic effects attained during this period are directly associated to the localized infiltration of local anesthetics and therefore cary significant diagnostic value as to the etiological location, or anatomical origin, of the pain. Expected duration of relief is directly dependent on the pharmacodynamics of the local anesthetic used. Long-acting (4-6 hours) anesthetics used.  Reported result: Relief during the next 4 to 6 hour after the procedure: 100 % (Short-Term Relief)            Interpretative annotation: Clinically appropriate result. Analgesia during this period is likely to be Local Anesthetic-related.          Long-term benefit: Defined as the period of time past the expected duration of local anesthetics (1 hour for short-acting and 4-6 hours  for long-acting). With the possible exception of prolonged sympathetic blockade from the local anesthetics, benefits during this period are typically attributed to, or associated with, other factors such as analgesic sensory neuropraxia, antiinflammatory effects, or beneficial biochemical changes provided by agents other than the local anesthetics.  Reported result: Extended relief following procedure: 20 % (patient continues to have pain but does not have the electrical shocking pain that she was having prior to procedure) (Long-Term Relief)            Interpretative annotation: Clinically appropriate result. Good relief. No permanent benefit expected. Inflammation plays a part in the etiology to the pain.          Current benefits: Defined as reported results that persistent at this point in time.   Analgesia: 0-25 %  Function: Somewhat improved in leg pain ROM: Somewhat improved Interpretative annotation: Recurrence of symptoms. No permanent benefit expected. Results would suggest persistent aggravating factors.          Interpretation: Results would suggest failure of therapy in achieving desired goal(s).                  Plan:  Please see "Plan of Care" for details.        Laboratory Chemistry  Inflammation Markers (CRP: Acute Phase) (ESR: Chronic Phase) No results found for: CRP, ESRSEDRATE               Renal Function Markers No results found for: BUN, CREATININE, GFRAA, GFRNONAA               Hepatic Function Markers No results found for: AST, ALT, ALBUMIN, ALKPHOS, HCVAB               Electrolytes No results found for: NA, K, CL, CALCIUM, MG               Neuropathy Markers No results found for: [id number]               Bone Pathology Markers No results found for: Hendricks Milo, VD125OH2TOT, [id number], [id number], 25OHVITD1, 25OHVITD2, 25OHVITD3, CALCIUM, TESTOFREE, TESTOSTERONE               Coagulation Parameters No results found for: INR, LABPROT, APTT, PLT                Cardiovascular Markers No results found for: BNP, HGB, HCT               Note: Lab results reviewed.  Recent Diagnostic Imaging Results  DG C-Arm 1-60 Min-No Report Fluoroscopy was utilized by the requesting physician.  No radiographic  interpretation.   Complexity Note: Imaging results reviewed. Results shared with Ms. Heinzelman, using Layman's terms.                         Meds   Current Outpatient Prescriptions:  .  ibuprofen (ADVIL,MOTRIN) 200 MG tablet, Take 200 mg by mouth every 6 (six) hours as needed., Disp: , Rfl:  .  methocarbamol (ROBAXIN) 750 MG tablet, Take 1 tablet (750 mg total) by mouth 4 (four) times daily., Disp: 90 tablet, Rfl: 2 .  Nerve Stimulator (ZEWA TENS/EMS COMBO UNIT) DEVI, by Does not apply route., Disp: , Rfl:  .  oxyCODONE (OXY IR/ROXICODONE) 5 MG immediate release tablet, Take 1 tablet (5 mg total) by mouth 3 (three) times daily with meals., Disp: 90 tablet, Rfl: 0 .  PARoxetine (PAXIL) 40 MG tablet, Take 0.5 tablets (20 mg total) by mouth at bedtime., Disp: 30 tablet, Rfl: 6 .  pregabalin (LYRICA) 75 MG capsule, Take 2 capsules (150 mg total) by mouth 2 (two) times daily., Disp: 120 capsule, Rfl: 5 .  traMADol (ULTRAM-ER) 100 MG 24 hr tablet, Take 1 tablet (100 mg total) by mouth daily., Disp: 30 tablet, Rfl: 0  Current Facility-Administered Medications:  .  iopamidol (ISOVUE-M) 41 % intrathecal injection 20 mL, 20 mL, Other, Once PRN, Molli Barrows, MD .  lactated ringers infusion 1,000 mL, 1,000 mL, Intravenous, Continuous, Adams, Alvina Filbert, MD .  lidocaine (PF) (XYLOCAINE) 1 % injection 5 mL, 5 mL, Subcutaneous, Once, Molli Barrows, MD .  midazolam (VERSED) injection 5 mg, 5 mg, Intravenous, Once, Molli Barrows, MD .  ropivacaine (PF) 2 mg/mL (0.2%) (NAROPIN) injection  10 mL, 10 mL, Epidural, Once, Molli Barrows, MD .  sodium chloride flush (NS) 0.9 % injection 10 mL, 10 mL, Other, Once, Molli Barrows, MD .  triamcinolone acetonide  (KENALOG-40) injection 40 mg, 40 mg, Other, Once, Molli Barrows, MD  ROS  Constitutional: Denies any fever or chills Gastrointestinal: No reported hemesis, hematochezia, vomiting, or acute GI distress Musculoskeletal: Denies any acute onset joint swelling, redness, loss of ROM, or weakness Neurological: No reported episodes of acute onset apraxia, aphasia, dysarthria, agnosia, amnesia, paralysis, loss of coordination, or loss of consciousness  Allergies  Ms. Masoner is allergic to duloxetine and penicillins.  PFSH  Drug: Ms. Lamke  reports that she does not use drugs. Alcohol:  reports that she does not drink alcohol. Tobacco:  reports that she has been smoking Cigarettes.  She has never used smokeless tobacco. Medical:  has a past medical history of Anxiety; Chronic back pain; and Depression. Surgical: Ms. Hurd  has a past surgical history that includes Lipoma excision. Family: family history includes Breast cancer in her paternal aunt; Pancreatic cancer in her father; Stomach cancer in her maternal uncle.  Constitutional Exam  General appearance: Well nourished, well developed, and well hydrated. In no apparent acute distress Vitals:   12/26/16 1515  BP: (!) 147/90  Pulse: 90  Resp: 16  Temp: 98.1 F (36.7 C)  TempSrc: Oral  SpO2: 99%  Weight: 231 lb 8 oz (105 kg)  Height: 5' 5.5" (1.664 m)   BMI Assessment: Estimated body mass index is 37.94 kg/m as calculated from the following:   Height as of this encounter: 5' 5.5" (1.664 m).   Weight as of this encounter: 231 lb 8 oz (105 kg).  BMI interpretation table: BMI level Category Range association with higher incidence of chronic pain  <18 kg/m2 Underweight   18.5-24.9 kg/m2 Ideal body weight   25-29.9 kg/m2 Overweight Increased incidence by 20%  30-34.9 kg/m2 Obese (Class I) Increased incidence by 68%  35-39.9 kg/m2 Severe obesity (Class II) Increased incidence by 136%  >40 kg/m2 Extreme obesity (Class III) Increased  incidence by 254%   BMI Readings from Last 4 Encounters:  12/26/16 37.94 kg/m  11/26/16 37.28 kg/m  10/21/16 37.28 kg/m  09/25/16 38.02 kg/m   Wt Readings from Last 4 Encounters:  12/26/16 231 lb 8 oz (105 kg)  11/26/16 224 lb (101.6 kg)  10/21/16 224 lb (101.6 kg)  09/25/16 232 lb (105.2 kg)  Psych/Mental status: Alert, oriented x 3 (person, place, & time)       Eyes: PERLA Respiratory: No evidence of acute respiratory distress  Cervical Spine Area Exam  Skin & Axial Inspection: No masses, redness, edema, swelling, or associated skin lesions Alignment: Symmetrical Functional ROM: Painful ROM      Stability: No instability detected Muscle Tone/Strength: Guarding observed Sensory (Neurological): Unimpaired Palpation: Complains of area being tender to palpation              Upper Extremity (UE) Exam    Side: Right upper extremity  Side: Left upper extremity  Skin & Extremity Inspection: Skin color, temperature, and hair growth are WNL. No peripheral edema or cyanosis. No masses, redness, swelling, asymmetry, or associated skin lesions. No contractures.  Skin & Extremity Inspection: Skin color, temperature, and hair growth are WNL. No peripheral edema or cyanosis. No masses, redness, swelling, asymmetry, or associated skin lesions. No contractures.  Functional ROM: Unrestricted ROM          Functional ROM: Unrestricted  ROM          Muscle Tone/Strength: Functionally intact. No obvious neuro-muscular anomalies detected.  Muscle Tone/Strength: Functionally intact. No obvious neuro-muscular anomalies detected.  Sensory (Neurological): Unimpaired          Sensory (Neurological): Unimpaired          Palpation: No palpable anomalies              Palpation: No palpable anomalies              Specialized Test(s): Deferred         Specialized Test(s): Deferred          Thoracic Spine Area Exam  Skin & Axial Inspection: No masses, redness, or swelling Alignment: Symmetrical Functional  ROM: Unrestricted ROM Stability: No instability detected Muscle Tone/Strength: Functionally intact. No obvious neuro-muscular anomalies detected. Sensory (Neurological): Unimpaired Muscle strength & Tone: No palpable anomalies  Lumbar Spine Area Exam  Skin & Axial Inspection: No masses, redness, or swelling Alignment: Symmetrical Functional ROM: Unrestricted ROM      Stability: No instability detected Muscle Tone/Strength: Functionally intact. No obvious neuro-muscular anomalies detected. Sensory (Neurological): Unimpaired Palpation: Complains of area being tender to palpation       Provocative Tests: Lumbar Hyperextension and rotation test: evaluation deferred today       Lumbar Lateral bending test: evaluation deferred today       Patrick's Maneuver: evaluation deferred today                    Gait & Posture Assessment  Ambulation: Patient ambulates using a cane Gait: Relatively normal for age and body habitus Posture: WNL   Lower Extremity Exam    Side: Right lower extremity  Side: Left lower extremity  Skin & Extremity Inspection: Skin color, temperature, and hair growth are WNL. No peripheral edema or cyanosis. No masses, redness, swelling, asymmetry, or associated skin lesions. No contractures.  Skin & Extremity Inspection: Skin color, temperature, and hair growth are WNL. No peripheral edema or cyanosis. No masses, redness, swelling, asymmetry, or associated skin lesions. No contractures.  Functional ROM: Unrestricted ROM          Functional ROM: Unrestricted ROM          Muscle Tone/Strength: Functionally intact. No obvious neuro-muscular anomalies detected.  Muscle Tone/Strength: Functionally intact. No obvious neuro-muscular anomalies detected.  Sensory (Neurological): Unimpaired  Sensory (Neurological): Unimpaired  Palpation: No palpable anomalies  Palpation: No palpable anomalies   Assessment  Primary Diagnosis & Pertinent Problem List: Diagnoses of Chronic bilateral  low back pain with bilateral sciatica, Chronic pain of both upper extremities, Chronic neck pain, and Chronic pain syndrome were pertinent to this visit.  Status Diagnosis  Worsening Persistent Worsening 1. Chronic bilateral low back pain with bilateral sciatica   2. Chronic pain of both upper extremities   3. Chronic neck pain   4. Chronic pain syndrome     Problems updated and reviewed during this visit: No problems updated. Plan of Care  Pharmacotherapy (Medications Ordered): Meds ordered this encounter  Medications  . oxyCODONE (OXY IR/ROXICODONE) 5 MG immediate release tablet    Sig: Take 1 tablet (5 mg total) by mouth 3 (three) times daily with meals.    Dispense:  90 tablet    Refill:  0    Order Specific Question:   Supervising Provider    Answer:   Milinda Pointer [phone]  . traMADol (ULTRAM-ER) 100 MG 24 hr tablet    Sig:  Take 1 tablet (100 mg total) by mouth daily.    Dispense:  30 tablet    Refill:  0    Order Specific Question:   Supervising Provider    Answer:   Milinda Pointer [phone]   New Prescriptions   TRAMADOL (ULTRAM-ER) 100 MG 24 HR TABLET    Take 1 tablet (100 mg total) by mouth daily.   Medications administered today: Ms. Washington had no medications administered during this visit. Lab-work, procedure(s), and/or referral(s): Orders Placed This Encounter  Procedures  . DG Cervical Spine With Flex & Extend   Imaging and/or referral(s): DG CERVICAL SPINE WITH FLEX & EXTEND  Interventional therapies: Planned, scheduled, and/or pending:   Not at this time. Tramadol ER added with explanation of previous change. Images pending fu in one month    Provider-requested follow-up: Return in about 4 weeks (around 01/23/2017) for w/ Dr. Andree Elk.  Future Appointments Date Time Provider [place]  01/01/2017 9:00 AM Aldona Lento, PT [facility] None  01/08/2017 9:45 AM Aldona Lento, PT [facility] None  01/22/2017 3:00 PM Andree Elk Alvina Filbert, MD  [facility] None   Primary Care Physician: Nathaneil Canary, PA-C Location: [facility] Note by: Vevelyn Francois NP Date: 12/26/2016; Time: 1:37 PM  Pain Score Disclaimer: We use the NRS-11 scale. This is a self-reported, subjective measurement of pain severity with only modest accuracy. It is used primarily to identify changes within a particular patient. It must be understood that outpatient pain scales are significantly less accurate that those used for research, where they can be applied under ideal controlled circumstances with minimal exposure to variables. In reality, the score is likely to be a combination of pain intensity and pain affect, where pain affect describes the degree of emotional arousal or changes in action readiness caused by the sensory experience of pain. Factors such as social and work situation, setting, emotional state, anxiety levels, expectation, and prior pain experience may influence pain perception and show large inter-individual differences that may also be affected by time variables.  Patient instructions provided during this appointment: Patient Instructions   ____________________________________________________________________________________________  Medication Rules  Applies to: All patients receiving prescriptions (written or electronic).  Pharmacy of record: Pharmacy where electronic prescriptions will be sent. If written prescriptions are taken to a different pharmacy, please inform the nursing staff. The pharmacy listed in the electronic medical record should be the one where you would like electronic prescriptions to be sent.  Prescription refills: Only during scheduled appointments. Applies to both, written and electronic prescriptions.  NOTE: The following applies primarily to controlled substances (Opioid* Pain Medications).   Patient's responsibilities: 1. Pain Pills: Bring all pain pills to every appointment  (except for procedure appointments). 2. Pill Bottles: Bring pills in original pharmacy bottle. Always bring newest bottle. Bring bottle, even if empty. 3. Medication refills: You are responsible for knowing and keeping track of what medications you need refilled. The day before your appointment, write a list of all prescriptions that need to be refilled. Bring that list to your appointment and give it to the admitting nurse. Prescriptions will be written only during appointments. If you forget a medication, it will not be "Called in", "Faxed", or "electronically sent". You will need to get another appointment to get these prescribed. 4. Prescription Accuracy: You are responsible for carefully inspecting your prescriptions before leaving our office. Have the discharge nurse carefully go over each prescription with you, before taking them home. Make sure that  your name is accurately spelled, that your address is correct. Check the name and dose of your medication to make sure it is accurate. Check the number of pills, and the written instructions to make sure they are clear and accurate. Make sure that you are given enough medication to last until your next medication refill appointment. 5. Taking Medication: Take medication as prescribed. Never take more pills than instructed. Never take medication more frequently than prescribed. Taking less pills or less frequently is permitted and encouraged, when it comes to controlled substances (written prescriptions).  6. Inform other Doctors: Always inform, all of your healthcare providers, of all the medications you take. 7. Pain Medication from other Providers: You are not allowed to accept any additional pain medication from any other Doctor or Healthcare provider. There are two exceptions to this rule. (see below) In the event that you require additional pain medication, you are responsible for notifying us, as stated below. 8. Medication Agreement: You are  responsible for carefully reading and following our Medication Agreement. This must be signed before receiving any prescriptions from our practice. Safely store a copy of your signed Agreement. Violations to the Agreement will result in no further prescriptions. (Additional copies of our Medication Agreement are available upon request.) 9. Laws, Rules, & Regulations: All patients are expected to follow all Federal and [employer], [employer], Rules, [employer]. Ignorance of the Laws does not constitute a valid excuse. The use of any illegal substances is prohibited. 10. Adopted CDC guidelines & recommendations: Target dosing levels will be at or below 60 MME/day. Use of benzodiazepines** is not recommended.  Exceptions: There are only two exceptions to the rule of not receiving pain medications from other Healthcare Providers. 1. Exception #1 (Emergencies): In the event of an emergency (i.e.: accident requiring emergency care), you are allowed to receive additional pain medication. However, you are responsible for: As soon as you are able, call our office [phone], at any time of the day or night, and leave a message stating your name, the date and nature of the emergency, and the name and dose of the medication prescribed. In the event that your call is answered by a member of our staff, make sure to document and save the date, time, and the name of the person that took your information.  2. Exception #2 (Planned Surgery): In the event that you are scheduled by another doctor or dentist to have any type of surgery or procedure, you are allowed (for a period no longer than 30 days), to receive additional pain medication, for the acute post-op pain. However, in this case, you are responsible for picking up a copy of our "Post-op Pain Management for Surgeons" handout, and giving it to your surgeon or dentist. This document is available at our office, and does not require an appointment to obtain it. Simply  go to our office during business hours (Monday-Thursday from 8:00 AM to 4:00 PM) (Friday 8:00 AM to 12:00 Noon) or if you have a scheduled appointment with [place], prior to your surgery, and ask for it by name. In addition, you will need to provide [place] with your name, name of your surgeon, type of surgery, and date of procedure or surgery.  *Opioid medications include: morphine, codeine, oxycodone, oxymorphone, hydrocodone, hydromorphone, meperidine, tramadol, tapentadol, buprenorphine, fentanyl, methadone. **Benzodiazepine medications include: diazepam (Valium), alprazolam (Xanax), clonazepam (Klonopine), lorazepam (Ativan), clorazepate (Tranxene), chlordiazepoxide (Librium), estazolam (Prosom), oxazepam (Serax), temazepam (Restoril), triazolam (Halcion)  ____________________________________________________________________________________________

## 2016-12-26 NOTE — Telephone Encounter (Signed)
Spoke with Hannah Maynard, she is not sure why appt was not made in a timely manner to coincide with medication management.  Will check schedule and let her know when she can come.   Spoke with Hannah Maynard and she will see her this afternoon.

## 2016-12-26 NOTE — Patient Instructions (Signed)

## 2016-12-27 ENCOUNTER — Telehealth: Payer: Self-pay | Admitting: *Deleted

## 2016-12-27 NOTE — Telephone Encounter (Signed)
Voicemail left with patient that I have contacted PT about pads for TENS unit.  I also asked patient to look at unit itself to see if there is a contact number.    PT called to say that they do not have any record of giving her a TENS unit.  They are not sure of how to obtain supplies.  Inside advice was offered that she may obtain pads from [facility], that any pads would fit.    I will speak back to patient on Monday.

## 2016-12-27 NOTE — Telephone Encounter (Signed)
Spoke with patient and she was going to call pharmacy to send [place] the form to PA.

## 2016-12-30 ENCOUNTER — Telehealth: Payer: Self-pay | Admitting: Nurse Practitioner

## 2016-12-30 NOTE — Telephone Encounter (Signed)
Patient called stating she needed Crystal to sign off on her meds so she can pick them up. Please call patient

## 2017-01-01 ENCOUNTER — Ambulatory Visit: Payer: Medicaid Other | Admitting: Physical Therapy

## 2017-01-01 ENCOUNTER — Ambulatory Visit
Admission: RE | Admit: 2017-01-01 | Discharge: 2017-01-01 | Disposition: A | Discharge status: Home / Self Care | Payer: Medicaid Other | Source: Ambulatory Visit | Attending: Nurse Practitioner | Admitting: Nurse Practitioner

## 2017-01-01 DIAGNOSIS — M542 Cervicalgia: Secondary | ICD-10-CM | POA: Diagnosis not present

## 2017-01-01 DIAGNOSIS — M47812 Spondylosis without myelopathy or radiculopathy, cervical region: Secondary | ICD-10-CM | POA: Insufficient documentation

## 2017-01-01 DIAGNOSIS — G8929 Other chronic pain: Secondary | ICD-10-CM

## 2017-01-01 DIAGNOSIS — M4312 Spondylolisthesis, cervical region: Secondary | ICD-10-CM | POA: Diagnosis not present

## 2017-01-01 NOTE — Telephone Encounter (Signed)
Patient called to check on prior auth of Tramadol. Please call patient and let her know status

## 2017-01-01 NOTE — Telephone Encounter (Signed)
PA sent today

## 2017-01-02 NOTE — Progress Notes (Signed)
Results were reviewed and found to be: abnormal  Further testing may be useful, no acute abnomalities  Review would suggest interventional pain management techniques may be of benefit

## 2017-01-06 ENCOUNTER — Ambulatory Visit: Payer: Medicaid Other | Attending: Dentistry

## 2017-01-06 NOTE — Telephone Encounter (Signed)
Patient called to check on prior auth for tramadol thru medicaid

## 2017-01-07 ENCOUNTER — Ambulatory Visit: Payer: No Typology Code available for payment source | Admitting: Anesthesiology

## 2017-01-08 ENCOUNTER — Telehealth: Payer: Self-pay

## 2017-01-08 ENCOUNTER — Ambulatory Visit: Payer: Medicaid Other | Admitting: Physical Therapy

## 2017-01-08 ENCOUNTER — Other Ambulatory Visit: Payer: Self-pay | Admitting: Nurse Practitioner

## 2017-01-08 ENCOUNTER — Telehealth: Payer: Self-pay | Admitting: *Deleted

## 2017-01-08 ENCOUNTER — Ambulatory Visit: Payer: Medicaid Other | Attending: Nurse Practitioner | Admitting: Nurse Practitioner

## 2017-01-08 VITALS — BP 154/99 | HR 99 | Temp 98.2°F | Resp 22

## 2017-01-08 DIAGNOSIS — M5441 Lumbago with sciatica, right side: Secondary | ICD-10-CM

## 2017-01-08 DIAGNOSIS — G8929 Other chronic pain: Secondary | ICD-10-CM | POA: Diagnosis not present

## 2017-01-08 DIAGNOSIS — M542 Cervicalgia: Secondary | ICD-10-CM

## 2017-01-08 DIAGNOSIS — M7918 Myalgia, other site: Secondary | ICD-10-CM | POA: Diagnosis present

## 2017-01-08 DIAGNOSIS — M5442 Lumbago with sciatica, left side: Secondary | ICD-10-CM

## 2017-01-08 DIAGNOSIS — G894 Chronic pain syndrome: Secondary | ICD-10-CM

## 2017-01-08 MED ORDER — ORPHENADRINE CITRATE 30 MG/ML IJ SOLN
60.0000 mg | Freq: Once | INTRAMUSCULAR | Status: AC
  Administered 2017-01-08: 60 mg via INTRAMUSCULAR

## 2017-01-08 MED ORDER — KETOROLAC TROMETHAMINE 60 MG/2ML IM SOLN
60.0000 mg | Freq: Once | INTRAMUSCULAR | Status: AC
  Administered 2017-01-08: 60 mg via INTRAMUSCULAR

## 2017-01-08 NOTE — Telephone Encounter (Signed)
Patient brought in for Toradol/Norflex injection.  PA refaxed to [place].

## 2017-01-08 NOTE — Patient Instructions (Signed)
  ____________________________________________________________________________________________  Appointment Policy Summary  It is our goal and responsibility to provide the medical community with assistance in the evaluation and management of patients with chronic pain. Unfortunately our resources are limited. Because we do not have an unlimited amount of time, or available appointments, we are required to closely monitor and manage their use. The following rules exist to maximize their use:  Patient's responsibilities: 1. Punctuality:  At what time should I arrive? You should be physically present in our office 30 minutes before your scheduled appointment. Your scheduled appointment is with your assigned healthcare provider. However, it takes 5-10 minutes to be "checked-in", and another 15 minutes for the nurses to do the admission. If you arrive to our office at the time you were given for your appointment, you will end up being at least 20-25 minutes late to your appointment with the provider. 2. Tardiness:  What happens if I arrive only a few minutes after my scheduled appointment time? You will need to reschedule your appointment. The cutoff is your appointment time. This is why it is so important that you arrive at least 30 minutes before that appointment. If you have an appointment scheduled for 10:00 AM and you arrive at 10:01, you will be required to reschedule your appointment.  3. Plan ahead:  Always assume that you will encounter traffic on your way in. Plan for it. If you are dependent on a driver, make sure they understand these rules and the need to arrive early. 4. Other appointments and responsibilities:  Avoid scheduling any other appointments before or after your pain clinic appointments.  5. Be prepared:  Write down everything that you need to discuss with your healthcare provider and give this information to the admitting nurse. Write down the medications that you will need  refilled. Bring your pills and bottles (even the empty ones), to all of your appointments, except for those where a procedure is scheduled. 6. No children or pets:  Find someone to take care of them. It is not appropriate to bring them in. 7. Scheduling changes:  We request "advanced notification" of any changes or cancellations. 8. Advanced notification:  Defined as a time period of more than 24 hours prior to the originally scheduled appointment. This allows for the appointment to be offered to other patients. 9. Rescheduling:  When a visit is rescheduled, it will require the cancellation of the original appointment. For this reason they both fall within the category of "Cancellations".  10. Cancellations:  They require advanced notification. Any cancellation less than 24 hours before the  appointment will be recorded as a "No Show". 11. No Show:  Defined as an unkept appointment where the patient failed to notify or declare to the practice their intention or inability to keep the appointment.  Corrective process for repeat offenders:  1. Tardiness: Three (3) episodes of rescheduling due to late arrivals will be recorded as one (1) "No Show". 2. Cancellation or reschedule: Three (3) cancellations or rescheduling will be recorded as one (1) "No Show". 3. "No Shows": Three (3) "No Shows" within a 12 month period will result in discharge from the practice.  ____________________________________________________________________________________________   

## 2017-01-08 NOTE — Progress Notes (Signed)
Patient in today for Toradoll/Norflex injection

## 2017-01-08 NOTE — Progress Notes (Signed)
Pt given Toradol and Norflex injection by nursing staff.

## 2017-01-09 ENCOUNTER — Ambulatory Visit: Payer: No Typology Code available for payment source | Admitting: Nurse Practitioner

## 2017-01-09 ENCOUNTER — Encounter: Payer: Self-pay | Admitting: Nurse Practitioner

## 2017-01-09 ENCOUNTER — Telehealth: Payer: Self-pay | Admitting: *Deleted

## 2017-01-09 NOTE — Telephone Encounter (Signed)
Spoke with Jocelyn Lamer at [facility] and informed her that a PA for Tramadol was sent yesterday.

## 2017-01-10 ENCOUNTER — Telehealth: Payer: Self-pay | Admitting: *Deleted

## 2017-01-10 NOTE — Telephone Encounter (Signed)
Spoke with  tracks.  States PA for Tramadol is still pending.  Patient notified.

## 2017-01-13 ENCOUNTER — Ambulatory Visit: Payer: Medicaid Other | Attending: Nurse Practitioner | Admitting: Nurse Practitioner

## 2017-01-13 ENCOUNTER — Telehealth: Payer: Self-pay | Admitting: *Deleted

## 2017-01-13 ENCOUNTER — Ambulatory Visit: Payer: Medicaid Other

## 2017-01-13 ENCOUNTER — Telehealth: Payer: Self-pay | Admitting: Anesthesiology

## 2017-01-13 ENCOUNTER — Encounter: Payer: Self-pay | Admitting: Nurse Practitioner

## 2017-01-13 VITALS — BP 156/93 | HR 83 | Temp 98.5°F | Resp 16 | Ht 65.5 in | Wt 230.0 lb

## 2017-01-13 DIAGNOSIS — G8929 Other chronic pain: Secondary | ICD-10-CM | POA: Insufficient documentation

## 2017-01-13 DIAGNOSIS — M542 Cervicalgia: Secondary | ICD-10-CM

## 2017-01-13 DIAGNOSIS — Z7409 Other reduced mobility: Secondary | ICD-10-CM

## 2017-01-13 DIAGNOSIS — M5441 Lumbago with sciatica, right side: Secondary | ICD-10-CM | POA: Diagnosis present

## 2017-01-13 DIAGNOSIS — Z79899 Other long term (current) drug therapy: Secondary | ICD-10-CM | POA: Insufficient documentation

## 2017-01-13 DIAGNOSIS — M5442 Lumbago with sciatica, left side: Secondary | ICD-10-CM

## 2017-01-13 DIAGNOSIS — M79601 Pain in right arm: Secondary | ICD-10-CM

## 2017-01-13 DIAGNOSIS — G894 Chronic pain syndrome: Secondary | ICD-10-CM

## 2017-01-13 DIAGNOSIS — M79602 Pain in left arm: Secondary | ICD-10-CM

## 2017-01-13 DIAGNOSIS — Z789 Other specified health status: Secondary | ICD-10-CM | POA: Insufficient documentation

## 2017-01-13 DIAGNOSIS — R29898 Other symptoms and signs involving the musculoskeletal system: Secondary | ICD-10-CM

## 2017-01-13 MED ORDER — ORPHENADRINE CITRATE 30 MG/ML IJ SOLN
60.0000 mg | Freq: Once | INTRAMUSCULAR | Status: AC
  Administered 2017-01-13: 60 mg via INTRAMUSCULAR

## 2017-01-13 MED ORDER — KETOROLAC TROMETHAMINE 60 MG/2ML IM SOLN
INTRAMUSCULAR | Status: AC
  Filled 2017-01-13: qty 2

## 2017-01-13 MED ORDER — KETOROLAC TROMETHAMINE 60 MG/2ML IM SOLN
60.0000 mg | Freq: Once | INTRAMUSCULAR | Status: AC
  Administered 2017-01-13: 60 mg via INTRAMUSCULAR

## 2017-01-13 MED ORDER — ORPHENADRINE CITRATE 30 MG/ML IJ SOLN
INTRAMUSCULAR | Status: AC
  Filled 2017-01-13: qty 2

## 2017-01-13 NOTE — Telephone Encounter (Signed)
Patient wants to know if she can come in and get another shot. Still is unable to pick up her prescription. Please call asap

## 2017-01-13 NOTE — Progress Notes (Signed)
Safety precautions to be maintained throughout the outpatient stay will include: orient to surroundings, keep bed in low position, maintain call bell within reach at all times, provide assistance with transfer out of bed and ambulation.    Pt received Toradol and Norflex injection

## 2017-01-13 NOTE — Telephone Encounter (Signed)
Tramadol  ER 100 mg tablet called to [facility] qty 30.  Will send PA when request received.

## 2017-01-13 NOTE — Telephone Encounter (Signed)
Called patient to let her know that I have sent Rx to [facility], voicemail left.

## 2017-01-13 NOTE — Patient Instructions (Signed)

## 2017-01-14 LAB — COMP. METABOLIC PANEL (12)
ALK PHOS: 117 IU/L (ref 39–117)
AST: 39 IU/L (ref 0–40)
Albumin/Globulin Ratio: 1.2 (ref 1.2–2.2)
Albumin: 4 g/dL (ref 3.5–5.5)
BILIRUBIN TOTAL: 0.3 mg/dL (ref 0.0–1.2)
BUN/Creatinine Ratio: 18 (ref 9–23)
BUN: 15 mg/dL (ref 6–24)
Calcium: 9.5 mg/dL (ref 8.7–10.2)
Chloride: 105 mmol/L (ref 96–106)
Creatinine, Ser: 0.83 mg/dL (ref 0.57–1.00)
GFR calc Af Amer: 93 mL/min/{1.73_m2} (ref >59)
GFR calc non Af Amer: 81 mL/min/{1.73_m2} (ref >59)
Globulin, Total: 3.4 g/dL (ref 1.5–4.5)
Glucose: 74 mg/dL (ref 65–99)
Potassium: 4.3 mmol/L (ref 3.5–5.2)
Sodium: 143 mmol/L (ref 134–144)
Total Protein: 7.4 g/dL (ref 6.0–8.5)

## 2017-01-14 LAB — SEDIMENTATION RATE: SED RATE: 10 mm/h (ref 0–40)

## 2017-01-14 LAB — C-REACTIVE PROTEIN: CRP: 1.2 mg/L (ref 0.0–4.9)

## 2017-01-14 LAB — MAGNESIUM: MAGNESIUM: 1.8 mg/dL (ref 1.6–2.3)

## 2017-01-18 LAB — TOXASSURE SELECT 13 (MW), URINE

## 2017-01-21 NOTE — Telephone Encounter (Signed)
done

## 2017-01-22 ENCOUNTER — Encounter: Payer: Self-pay | Admitting: Anesthesiology

## 2017-01-22 ENCOUNTER — Other Ambulatory Visit: Payer: Self-pay

## 2017-01-22 ENCOUNTER — Ambulatory Visit
Admission: RE | Admit: 2017-01-22 | Discharge: 2017-01-22 | Disposition: A | Discharge status: Home / Self Care | Payer: Medicaid Other | Source: Ambulatory Visit | Attending: Anesthesiology | Admitting: Anesthesiology

## 2017-01-22 ENCOUNTER — Ambulatory Visit: Payer: Medicaid Other | Admitting: Anesthesiology

## 2017-01-22 ENCOUNTER — Other Ambulatory Visit: Payer: Self-pay | Admitting: Anesthesiology

## 2017-01-22 VITALS — BP 124/71 | HR 92 | Temp 97.3°F | Resp 17 | Ht 65.5 in | Wt 230.0 lb

## 2017-01-22 DIAGNOSIS — M542 Cervicalgia: Secondary | ICD-10-CM | POA: Diagnosis not present

## 2017-01-22 DIAGNOSIS — M5136 Other intervertebral disc degeneration, lumbar region: Secondary | ICD-10-CM | POA: Diagnosis not present

## 2017-01-22 DIAGNOSIS — G894 Chronic pain syndrome: Secondary | ICD-10-CM | POA: Diagnosis not present

## 2017-01-22 DIAGNOSIS — G8929 Other chronic pain: Secondary | ICD-10-CM

## 2017-01-22 DIAGNOSIS — M47816 Spondylosis without myelopathy or radiculopathy, lumbar region: Secondary | ICD-10-CM

## 2017-01-22 DIAGNOSIS — M79601 Pain in right arm: Secondary | ICD-10-CM

## 2017-01-22 DIAGNOSIS — R52 Pain, unspecified: Secondary | ICD-10-CM

## 2017-01-22 DIAGNOSIS — R29898 Other symptoms and signs involving the musculoskeletal system: Secondary | ICD-10-CM

## 2017-01-22 DIAGNOSIS — Z79899 Other long term (current) drug therapy: Secondary | ICD-10-CM | POA: Diagnosis not present

## 2017-01-22 DIAGNOSIS — M5441 Lumbago with sciatica, right side: Secondary | ICD-10-CM | POA: Insufficient documentation

## 2017-01-22 DIAGNOSIS — M5442 Lumbago with sciatica, left side: Secondary | ICD-10-CM

## 2017-01-22 DIAGNOSIS — R531 Weakness: Secondary | ICD-10-CM | POA: Diagnosis not present

## 2017-01-22 DIAGNOSIS — M79604 Pain in right leg: Secondary | ICD-10-CM | POA: Diagnosis present

## 2017-01-22 DIAGNOSIS — M4696 Unspecified inflammatory spondylopathy, lumbar region: Secondary | ICD-10-CM | POA: Diagnosis present

## 2017-01-22 DIAGNOSIS — M545 Low back pain: Secondary | ICD-10-CM | POA: Diagnosis present

## 2017-01-22 DIAGNOSIS — M79602 Pain in left arm: Secondary | ICD-10-CM

## 2017-01-22 MED ORDER — LIDOCAINE HCL (PF) 1 % IJ SOLN
5.0000 mL | Freq: Once | INTRAMUSCULAR | Status: AC
  Administered 2017-01-22: 5 mL via SUBCUTANEOUS

## 2017-01-22 MED ORDER — ROPIVACAINE HCL 2 MG/ML IJ SOLN
INTRAMUSCULAR | Status: AC
  Filled 2017-01-22: qty 10

## 2017-01-22 MED ORDER — TRIAMCINOLONE ACETONIDE 40 MG/ML IJ SUSP
INTRAMUSCULAR | Status: AC
  Filled 2017-01-22: qty 1

## 2017-01-22 MED ORDER — SODIUM CHLORIDE 0.9% FLUSH
10.0000 mL | Freq: Once | INTRAVENOUS | Status: AC
  Administered 2017-01-22: 10 mL

## 2017-01-22 MED ORDER — LIDOCAINE HCL (PF) 1 % IJ SOLN
INTRAMUSCULAR | Status: AC
  Filled 2017-01-22: qty 5

## 2017-01-22 MED ORDER — TRIAMCINOLONE ACETONIDE 40 MG/ML IJ SUSP
40.0000 mg | Freq: Once | INTRAMUSCULAR | Status: AC
  Administered 2017-01-22: 40 mg

## 2017-01-22 MED ORDER — IOPAMIDOL (ISOVUE-M 200) INJECTION 41%
20.0000 mL | Freq: Once | INTRAMUSCULAR | Status: DC | PRN
  Administered 2017-01-22: 10 mL
  Filled 2017-01-22: qty 20

## 2017-01-22 MED ORDER — MIDAZOLAM HCL 5 MG/5ML IJ SOLN
INTRAMUSCULAR | Status: AC
  Filled 2017-01-22: qty 5

## 2017-01-22 MED ORDER — LACTATED RINGERS IV SOLN
1000.0000 mL | INTRAVENOUS | Status: DC
  Administered 2017-01-22: 1000 mL via INTRAVENOUS

## 2017-01-22 MED ORDER — ROPIVACAINE HCL 2 MG/ML IJ SOLN
10.0000 mL | Freq: Once | INTRAMUSCULAR | Status: AC
  Administered 2017-01-22: 10 mL via EPIDURAL

## 2017-01-22 MED ORDER — SODIUM CHLORIDE 0.9 % IJ SOLN
INTRAMUSCULAR | Status: AC
  Filled 2017-01-22: qty 10

## 2017-01-22 MED ORDER — MIDAZOLAM HCL 2 MG/2ML IJ SOLN
5.0000 mg | Freq: Once | INTRAMUSCULAR | Status: AC
  Administered 2017-01-22: 4 mg via INTRAVENOUS

## 2017-01-22 MED ORDER — OXYCODONE HCL 5 MG PO TABS: 5.0000 mg | ORAL_TABLET | Freq: Four times a day (QID) | ORAL | 0 refills | Status: DC | PRN

## 2017-01-22 MED ORDER — IOPAMIDOL (ISOVUE-M 200) INJECTION 41%
INTRAMUSCULAR | Status: AC
  Filled 2017-01-22: qty 10

## 2017-01-22 NOTE — Patient Instructions (Signed)
GENERAL RISKS AND COMPLICATIONS  What are the risk, side effects and possible complications? Generally speaking, most procedures are safe.  However, with any procedure there are risks, side effects, and the possibility of complications.  The risks and complications are dependent upon the sites that are lesioned, or the type of nerve block to be performed.  The closer the procedure is to the spine, the more serious the risks are.  Great care is taken when placing the radio frequency needles, block needles or lesioning probes, but sometimes complications can occur. 1. Infection: Any time there is an injection through the skin, there is a risk of infection.  This is why sterile conditions are used for these blocks.  There are four possible types of infection. 1. Localized skin infection. 2. Central Nervous System Infection-This can be in the form of Meningitis, which can be deadly. 3. Epidural Infections-This can be in the form of an epidural abscess, which can cause pressure inside of the spine, causing compression of the spinal cord with subsequent paralysis. This would require an emergency surgery to decompress, and there are no guarantees that the patient would recover from the paralysis. 4. Discitis-This is an infection of the intervertebral discs.  It occurs in about 1% of discography procedures.  It is difficult to treat and it may lead to surgery.        2. Pain: the needles have to go through skin and soft tissues, will cause soreness.       3. Damage to internal structures:  The nerves to be lesioned may be near blood vessels or    other nerves which can be potentially damaged.       4. Bleeding: Bleeding is more common if the patient is taking blood thinners such as  aspirin, Coumadin, Ticiid, Plavix, etc., or if he/she have some genetic predisposition  such as hemophilia. Bleeding into the spinal canal can cause compression of the spinal  cord with subsequent paralysis.  This would require an  emergency surgery to  decompress and there are no guarantees that the patient would recover from the  paralysis.       5. Pneumothorax:  Puncturing of a lung is a possibility, every time a needle is introduced in  the area of the chest or upper back.  Pneumothorax refers to free air around the  collapsed lung(s), inside of the thoracic cavity (chest cavity).  Another two possible  complications related to a similar event would include: Hemothorax and Chylothorax.   These are variations of the Pneumothorax, where instead of air around the collapsed  lung(s), you may have blood or chyle, respectively.       6. Spinal headaches: They may occur with any procedures in the area of the spine.       7. Persistent CSF (Cerebro-Spinal Fluid) leakage: This is a rare problem, but may occur  with prolonged intrathecal or epidural catheters either due to the formation of a fistulous  track or a dural tear.       8. Nerve damage: By working so close to the spinal cord, there is always a possibility of  nerve damage, which could be as serious as a permanent spinal cord injury with  paralysis.       9. Death:  Although rare, severe deadly allergic reactions known as "Anaphylactic  reaction" can occur to any of the medications used.      10. Worsening of the symptoms:  We can always make thing worse.    What are the chances of something like this happening? Chances of any of this occuring are extremely low.  By statistics, you have more of a chance of getting killed in a motor vehicle accident: while driving to the hospital than any of the above occurring .  Nevertheless, you should be aware that they are possibilities.  In general, it is similar to taking a shower.  Everybody knows that you can slip, hit your head and get killed.  Does that mean that you should not shower again?  Nevertheless always keep in mind that statistics do not mean anything if you happen to be on the wrong side of them.  Even if a procedure has a 1  (one) in a 1,000,000 (million) chance of going wrong, it you happen to be that one..Also, keep in mind that by statistics, you have more of a chance of having something go wrong when taking medications.  Who should not have this procedure? If you are on a blood thinning medication (e.g. Coumadin, Plavix, see list of "Blood Thinners"), or if you have an active infection going on, you should not have the procedure.  If you are taking any blood thinners, please inform your physician.  How should I prepare for this procedure?  Do not eat or drink anything at least six hours prior to the procedure.  Bring a driver with you .  It cannot be a taxi.  Come accompanied by an adult that can drive you back, and that is strong enough to help you if your legs get weak or numb from the local anesthetic.  Take all of your medicines the morning of the procedure with just enough water to swallow them.  If you have diabetes, make sure that you are scheduled to have your procedure done first thing in the morning, whenever possible.  If you have diabetes, take only half of your insulin dose and notify our nurse that you have done so as soon as you arrive at the clinic.  If you are diabetic, but only take blood sugar pills (oral hypoglycemic), then do not take them on the morning of your procedure.  You may take them after you have had the procedure.  Do not take aspirin or any aspirin-containing medications, at least eleven (11) days prior to the procedure.  They may prolong bleeding.  Wear loose fitting clothing that may be easy to take off and that you would not mind if it got stained with Betadine or blood.  Do not wear any jewelry or perfume  Remove any nail coloring.  It will interfere with some of our monitoring equipment.  NOTE: Remember that this is not meant to be interpreted as a complete list of all possible complications.  Unforeseen problems may occur.  BLOOD THINNERS The following drugs  contain aspirin or other products, which can cause increased bleeding during surgery and should not be taken for 2 weeks prior to and 1 week after surgery.  If you should need take something for relief of minor pain, you may take acetaminophen which is found in Tylenol,m Datril, Anacin-3 and Panadol. It is not blood thinner. The products listed below are.  Do not take any of the products listed below in addition to any listed on your instruction sheet.  A.P.C or A.P.C with Codeine Codeine Phosphate Capsules #3 Ibuprofen Ridaura  ABC compound Congesprin Imuran rimadil  Advil Cope Indocin Robaxisal  Alka-Seltzer Effervescent Pain Reliever and Antacid Coricidin or Coricidin-D  Indomethacin Rufen    Alka-Seltzer plus Cold Medicine Cosprin Ketoprofen S-A-C Tablets  Anacin Analgesic Tablets or Capsules Coumadin Korlgesic Salflex  Anacin Extra Strength Analgesic tablets or capsules CP-2 Tablets Lanoril Salicylate  Anaprox Cuprimine Capsules Levenox Salocol  Anexsia-D Dalteparin Magan Salsalate  Anodynos Darvon compound Magnesium Salicylate Sine-off  Ansaid Dasin Capsules Magsal Sodium Salicylate  Anturane Depen Capsules Marnal Soma  APF Arthritis pain formula Dewitt's Pills Measurin Stanback  Argesic Dia-Gesic Meclofenamic Sulfinpyrazone  Arthritis Bayer Timed Release Aspirin Diclofenac Meclomen Sulindac  Arthritis pain formula Anacin Dicumarol Medipren Supac  Analgesic (Safety coated) Arthralgen Diffunasal Mefanamic Suprofen  Arthritis Strength Bufferin Dihydrocodeine Mepro Compound Suprol  Arthropan liquid Dopirydamole Methcarbomol with Aspirin Synalgos  ASA tablets/Enseals Disalcid Micrainin Tagament  Ascriptin Doan's Midol Talwin  Ascriptin A/D Dolene Mobidin Tanderil  Ascriptin Extra Strength Dolobid Moblgesic Ticlid  Ascriptin with Codeine Doloprin or Doloprin with Codeine Momentum Tolectin  Asperbuf Duoprin Mono-gesic Trendar  Aspergum Duradyne Motrin or Motrin IB Triminicin  Aspirin  plain, buffered or enteric coated Durasal Myochrisine Trigesic  Aspirin Suppositories Easprin Nalfon Trillsate  Aspirin with Codeine Ecotrin Regular or Extra Strength Naprosyn Uracel  Atromid-S Efficin Naproxen Ursinus  Auranofin Capsules Elmiron Neocylate Vanquish  Axotal Emagrin Norgesic Verin  Azathioprine Empirin or Empirin with Codeine Normiflo Vitamin E  Azolid Emprazil Nuprin Voltaren  Bayer Aspirin plain, buffered or children's or timed BC Tablets or powders Encaprin Orgaran Warfarin Sodium  Buff-a-Comp Enoxaparin Orudis Zorpin  Buff-a-Comp with Codeine Equegesic Os-Cal-Gesic   Buffaprin Excedrin plain, buffered or Extra Strength Oxalid   Bufferin Arthritis Strength Feldene Oxphenbutazone   Bufferin plain or Extra Strength Feldene Capsules Oxycodone with Aspirin   Bufferin with Codeine Fenoprofen Fenoprofen Pabalate or Pabalate-SF   Buffets II Flogesic Panagesic   Buffinol plain or Extra Strength Florinal or Florinal with Codeine Panwarfarin   Buf-Tabs Flurbiprofen Penicillamine   Butalbital Compound Four-way cold tablets Penicillin   Butazolidin Fragmin Pepto-Bismol   Carbenicillin Geminisyn Percodan   Carna Arthritis Reliever Geopen Persantine   Carprofen Gold's salt Persistin   Chloramphenicol Goody's Phenylbutazone   Chloromycetin Haltrain Piroxlcam   Clmetidine heparin Plaquenil   Cllnoril Hyco-pap Ponstel   Clofibrate Hydroxy chloroquine Propoxyphen         Before stopping any of these medications, be sure to consult the physician who ordered them.  Some, such as Coumadin (Warfarin) are ordered to prevent or treat serious conditions such as "deep thrombosis", "pumonary embolisms", and other heart problems.  The amount of time that you may need off of the medication may also vary with the medication and the reason for which you were taking it.  If you are taking any of these medications, please make sure you notify your pain physician before you undergo any  procedures.         Pain Management Discharge Instructions  General Discharge Instructions :  If you need to reach your doctor call: Monday-Friday 8:00 am - 4:00 pm at [phone] or toll free [phone].  After clinic hours [phone] to have operator reach doctor.  Bring all of your medication bottles to all your appointments in the pain clinic.  To cancel or reschedule your appointment with Pain Management please remember to call 24 hours in advance to avoid a fee.  Refer to the educational materials which you have been given on: General Risks, I had my Procedure. Discharge Instructions, Post Sedation.  Post Procedure Instructions:  The drugs you were given will stay in your system until tomorrow, so for the next 24 hours you should   not drive, make any legal decisions or drink any alcoholic beverages.  You may eat anything you prefer, but it is better to start with liquids then soups and crackers, and gradually work up to solid foods.  Please notify your doctor immediately if you have any unusual bleeding, trouble breathing or pain that is not related to your normal pain.  Depending on the type of procedure that was done, some parts of your body may feel week and/or numb.  This usually clears up by tonight or the next day.  Walk with the use of an assistive device or accompanied by an adult for the 24 hours.  You may use ice on the affected area for the first 24 hours.  Put ice in a Ziploc bag and cover with a towel and place against area 15 minutes on 15 minutes off.  You may switch to heat after 24 hours.

## 2017-01-22 NOTE — Progress Notes (Signed)
Safety precautions to be maintained throughout the outpatient stay will include: orient to surroundings, keep bed in low position, maintain call bell within reach at all times, provide assistance with transfer out of bed and ambulation.  

## 2017-01-23 ENCOUNTER — Telehealth: Payer: Self-pay | Admitting: *Deleted

## 2017-01-23 NOTE — Telephone Encounter (Signed)
Denies post procedure complications.

## 2017-01-23 NOTE — Telephone Encounter (Signed)
Patient called and informed that script was ready for pick up. Also given a mediation refill date of December 28th at 11:30.

## 2017-01-24 NOTE — Progress Notes (Signed)
Subjective:  Patient ID: Hannah Maynard, female    DOB: November 28, 1965  Age: 51 y.o. MRN: [id number]  CC: Back Pain (low) and Leg Pain (right is worse)   Procedure: L5-S1 epidural steroid No. 3 05/2016 under fluoroscopic guidance with moderate sedation Previous procedures include bilateral facet block x1  HPI Hannah Maynard presents for reevaluation.  She was last seen about a month ago and she is now status post her most recent epidural with a 50% reduction in her low back pain and improvement in her lower extremity pain.  She has had more pain relief on the left than right side and the right side has been more problematic recently.  Otherwise no change in bowel or bladder function or strength has been noted.  She is taking her medications as prescribed with no diverting or illicit use and based on her narcotic assessment sheet she continues to derive good functional lifestyle improvement with her medicines.  She is currently using oxycodone 5 mg tablets up to 3 times a day and taking Lyrica and methocarbamol for muscle spasm.  She frequently takes this 4 times a day but is having more troubles at night with muscle spasms and we talked about reducing her dosing during the day to a tablet in the morning afternoon and 2 tablets at bedtime.  Outpatient Medications Prior to Visit  Medication Sig Dispense Refill  . ibuprofen (ADVIL,MOTRIN) 200 MG tablet Take 200 mg by mouth every 6 (six) hours as needed.    . methocarbamol (ROBAXIN) 750 MG tablet Take 1 tablet (750 mg total) by mouth 4 (four) times daily. 90 tablet 2  . Nerve Stimulator (ZEWA TENS/EMS COMBO UNIT) DEVI by Does not apply route.    Marland Kitchen PARoxetine (PAXIL) 40 MG tablet Take 0.5 tablets (20 mg total) by mouth at bedtime. 30 tablet 6  . pregabalin (LYRICA) 75 MG capsule Take 2 capsules (150 mg total) by mouth 2 (two) times daily. 120 capsule 5  . traMADol (ULTRAM-ER) 100 MG 24 hr tablet Take 1 tablet (100 mg total) by mouth daily. 30 tablet 0   . oxyCODONE (OXY IR/ROXICODONE) 5 MG immediate release tablet Take 1 tablet (5 mg total) by mouth 3 (three) times daily with meals. 90 tablet 0   Facility-Administered Medications Prior to Visit  Medication Dose Route Frequency Provider Last Rate Last Dose  . iopamidol (ISOVUE-M) 41 % intrathecal injection 20 mL  20 mL Other Once PRN Molli Barrows, MD      . lactated ringers infusion 1,000 mL  1,000 mL Intravenous Continuous Molli Barrows, MD      . lidocaine (PF) (XYLOCAINE) 1 % injection 5 mL  5 mL Subcutaneous Once Molli Barrows, MD      . midazolam (VERSED) injection 5 mg  5 mg Intravenous Once Molli Barrows, MD      . ropivacaine (PF) 2 mg/mL (0.2%) (NAROPIN) injection 10 mL  10 mL Epidural Once Molli Barrows, MD      . sodium chloride flush (NS) 0.9 % injection 10 mL  10 mL Other Once Molli Barrows, MD      . triamcinolone acetonide (KENALOG-40) injection 40 mg  40 mg Other Once Molli Barrows, MD        Review of Systems CNS: No confusion or sedation Cardiac: No angina or palpitations GI: No constipation or abdominal pain  Objective:  BP 124/71   Pulse 92   Temp (!) 97.3 F (36.3 C)  Resp 17   Ht 5' 5.5" (1.664 m)   Wt 230 lb (104.3 kg)   LMP  (LMP Unknown)   SpO2 97%   BMI 37.69 kg/m    BP Readings from Last 3 Encounters:  01/22/17 124/71  01/13/17 (!) 156/93  01/08/17 (!) 154/99     Wt Readings from Last 3 Encounters:  01/22/17 230 lb (104.3 kg)  01/13/17 230 lb (104.3 kg)  12/26/16 231 lb 8 oz (105 kg)     Physical Exam Pt is alert and oriented PERRL EOMI HEART IS RRR no murmur or rub LCTA no wheezing or rhales MUSCULOSKELETAL reveals some paraspinous muscle tenderness but no overt trigger points noted on examination today.  She has a positive straight leg raise right side negative left.  She has some paraspinous muscle tenderness on the right side greater than left.  She ambulates with an antalgic gait  Labs  No results found for: HGBA1C Lab  Results  Component Value Date   CREATININE 0.83 01/13/2017    -------------------------------------------------------------------------------------------------------------------- Lab Results  Component Value Date   GLUCOSE 74 01/13/2017   AST 39 01/13/2017   NA 143 01/13/2017   K 4.3 01/13/2017   CL 105 01/13/2017   CREATININE 0.83 01/13/2017   BUN 15 01/13/2017    --------------------------------------------------------------------------------------------------------------------- Dg C-arm 1-60 Min-no Report  Result Date: 01/22/2017 Fluoroscopy was utilized by the requesting physician.  No radiographic interpretation.     Assessment & Plan:   Hannah Maynard was seen today for back pain and leg pain.  Diagnoses and all orders for this visit:  Chronic bilateral low back pain with bilateral sciatica -     triamcinolone acetonide (KENALOG-40) injection 40 mg -     sodium chloride flush (NS) 0.9 % injection 10 mL -     ropivacaine (PF) 2 mg/mL (0.2%) (NAROPIN) injection 10 mL -     midazolam (VERSED) injection 5 mg -     lidocaine (PF) (XYLOCAINE) 1 % injection 5 mL -     lactated ringers infusion 1,000 mL -     iopamidol (ISOVUE-M) 41 % intrathecal injection 20 mL  Chronic pain of both upper extremities  Chronic neck pain  Chronic pain syndrome -     oxyCODONE (OXY IR/ROXICODONE) 5 MG immediate release tablet; Take 1 tablet (5 mg total) by mouth every 6 (six) hours as needed for severe pain.  Other long term (current) drug therapy  Weakness of both lower extremities  Facet arthritis of lumbar region ([place]) -     LUMBAR FACET(MEDIAL BRANCH NERVE BLOCK) MBNB; Future  DDD (degenerative disc disease), lumbar        ----------------------------------------------------------------------------------------------------------------------  Problem List Items Addressed This Visit      Unprioritized   Chronic low back pain - Primary (Chronic)   Relevant Medications    triamcinolone acetonide (KENALOG-40) injection 40 mg (Completed)   sodium chloride flush (NS) 0.9 % injection 10 mL (Completed)   ropivacaine (PF) 2 mg/mL (0.2%) (NAROPIN) injection 10 mL (Completed)   midazolam (VERSED) injection 5 mg (Completed)   lidocaine (PF) (XYLOCAINE) 1 % injection 5 mL (Completed)   lactated ringers infusion 1,000 mL   iopamidol (ISOVUE-M) 41 % intrathecal injection 20 mL   oxyCODONE (OXY IR/ROXICODONE) 5 MG immediate release tablet   Chronic neck pain   Relevant Medications   triamcinolone acetonide (KENALOG-40) injection 40 mg (Completed)   ropivacaine (PF) 2 mg/mL (0.2%) (NAROPIN) injection 10 mL (Completed)   lidocaine (PF) (XYLOCAINE) 1 % injection 5  mL (Completed)   oxyCODONE (OXY IR/ROXICODONE) 5 MG immediate release tablet   Chronic pain syndrome   Relevant Medications   oxyCODONE (OXY IR/ROXICODONE) 5 MG immediate release tablet   Chronic upper extremity pain (Chronic)   Relevant Medications   triamcinolone acetonide (KENALOG-40) injection 40 mg (Completed)   ropivacaine (PF) 2 mg/mL (0.2%) (NAROPIN) injection 10 mL (Completed)   lidocaine (PF) (XYLOCAINE) 1 % injection 5 mL (Completed)   oxyCODONE (OXY IR/ROXICODONE) 5 MG immediate release tablet   Other long term (current) drug therapy    Other Visit Diagnoses    Weakness of both lower extremities       Facet arthritis of lumbar region [facility])       Relevant Medications   triamcinolone acetonide (KENALOG-40) injection 40 mg (Completed)   oxyCODONE (OXY IR/ROXICODONE) 5 MG immediate release tablet   Other Relevant Orders   LUMBAR FACET(MEDIAL BRANCH NERVE BLOCK) MBNB   DDD (degenerative disc disease), lumbar       Relevant Medications   triamcinolone acetonide (KENALOG-40) injection 40 mg (Completed)   oxyCODONE (OXY IR/ROXICODONE) 5 MG immediate release tablet        ----------------------------------------------------------------------------------------------------------------------  1.  Chronic bilateral low back pain with bilateral sciatica Secondary to the persistent sciatica she is experiencing and intractable pain we will proceed with a repeat epidural today.  She feels like these of helped with her lower extremity pain and this is a problem today however she feels that the one facet block that she had helped significantly with the back pain and would like to proceed with a repeat facet injection at her next visit.  I think this would be reasonable and then we will take a span of time off in an effort to minimize steroid side effects.  Gone over the risks and benefits of epidural steroids today in full detail all questions were answered.  We will have her return to clinic in 1-2 months. - triamcinolone acetonide (KENALOG-40) injection 40 mg - sodium chloride flush (NS) 0.9 % injection 10 mL - ropivacaine (PF) 2 mg/mL (0.2%) (NAROPIN) injection 10 mL - midazolam (VERSED) injection 5 mg - lidocaine (PF) (XYLOCAINE) 1 % injection 5 mL - lactated ringers infusion 1,000 mL - iopamidol (ISOVUE-M) 41 % intrathecal injection 20 mL  2. Chronic pain of both upper extremities Continue at stretching strengthening exercises  3. Chronic neck pain We talked about orthotic pillow and sleeping positioning as well as exercises  4. Chronic pain syndrome We reviewed the [facility] practitioner database information and it is appropriate.  Refill was given for November 29 - oxyCODONE (OXY IR/ROXICODONE) 5 MG immediate release tablet; Take 1 tablet (5 mg total) by mouth every 6 (six) hours as needed for severe pain.  Dispense: 120 tablet; Refill: 0  5. Other long term (current) drug therapy As above  6. Weakness of both lower extremities   7. Facet arthritis of lumbar region ([place])  - LUMBAR FACET(MEDIAL BRANCH NERVE BLOCK) MBNB; Future  8. DDD (degenerative disc disease),  lumbar     ----------------------------------------------------------------------------------------------------------------------  I have changed Hannah Maynard's oxyCODONE. I am also having her maintain her ibuprofen, ZEWA TENS/EMS COMBO UNIT, PARoxetine, methocarbamol, pregabalin, and traMADol. We administered triamcinolone acetonide, sodium chloride flush, ropivacaine (PF) 2 mg/mL (0.2%), midazolam, lidocaine (PF), lactated ringers, and iopamidol. We will continue to administer triamcinolone acetonide, sodium chloride flush, ropivacaine (PF) 2 mg/mL (0.2%), midazolam, lidocaine (PF), lactated ringers, and iopamidol.   Meds ordered this encounter  Medications  .  triamcinolone acetonide (KENALOG-40) injection 40 mg  . sodium chloride flush (NS) 0.9 % injection 10 mL  . ropivacaine (PF) 2 mg/mL (0.2%) (NAROPIN) injection 10 mL  . midazolam (VERSED) injection 5 mg  . lidocaine (PF) (XYLOCAINE) 1 % injection 5 mL  . lactated ringers infusion 1,000 mL  . iopamidol (ISOVUE-M) 41 % intrathecal injection 20 mL  . oxyCODONE (OXY IR/ROXICODONE) 5 MG immediate release tablet    Sig: Take 1 tablet (5 mg total) by mouth every 6 (six) hours as needed for severe pain.    Dispense:  120 tablet    Refill:  0    Do not fill until [id number]     Medication List        Accurate as of 01/22/17 11:59 PM. Always use your most recent med list.          ibuprofen 200 MG tablet Commonly known as:  ADVIL,MOTRIN   methocarbamol 750 MG tablet Commonly known as:  ROBAXIN Take 1 tablet (750 mg total) by mouth 4 (four) times daily.   oxyCODONE 5 MG immediate release tablet Commonly known as:  Oxy IR/ROXICODONE Take 1 tablet (5 mg total) by mouth every 6 (six) hours as needed for severe pain.   PARoxetine 40 MG tablet Commonly known as:  PAXIL Take 0.5 tablets (20 mg total) by mouth at bedtime.   pregabalin 75 MG capsule Commonly known as:  LYRICA Take 2 capsules (150 mg total) by mouth 2 (two)  times daily.   traMADol 100 MG 24 hr tablet Commonly known as:  ULTRAM-ER Take 1 tablet (100 mg total) by mouth daily.   ZEWA TENS/EMS COMBO UNIT Devi       Where to Get Your Medications    You can get these medications from any pharmacy   Bring a paper prescription for each of these medications  oxyCODONE 5 MG immediate release tablet    ---------------------------------------------------------------------------------------------------------------------- Procedure: Lumbar epidural steroid No. 3 under fluoroscopic guidance at L5-S1    Procedure: L5-S1 LESI with fluoroscopic guidance and moderate sedation  NOTE: The risks, benefits, and expectations of the procedure have been discussed and explained to the patient who was understanding and in agreement with suggested treatment plan. No guarantees were made.  DESCRIPTION OF PROCEDURE: Lumbar epidural steroid injection with IV Versed, EKG, blood pressure, pulse, and pulse oximetry monitoring. The procedure was performed with the patient in the prone position under fluoroscopic guidance. I injected subcutaneous lidocaine overlying the L5 S1 site after its fluoroscopic identifictation.  Using strict aseptic technique, I then advanced an 18-gauge Tuohy epidural needle in the midline using interlaminar approach via loss-of-resistance to saline technique. There was negative aspiration for heme or  CSF.  I then confirmed position with both AP and Lateral fluoroscan.  2 cc of Isovue were injected and a  total of 5 mL of Preservative-Free normal saline mixed with 40 mg of Kenalog and 1cc Ropicaine 0.2 percent were injected incrementally via the  epidurally placed needle. The needle was removed. The patient tolerated the injection well and was convalesced and discharged to home in stable condition. Should the patient have any post procedure difficulty they have been instructed on how to contact us for assistance.   Follow-up: Return in about 1 month  (around 02/21/2017) for evaluation, procedure.    Molli Barrows, MD

## 2017-02-21 ENCOUNTER — Encounter: Payer: Self-pay | Admitting: Anesthesiology

## 2017-02-21 ENCOUNTER — Ambulatory Visit: Payer: Medicaid Other | Attending: Anesthesiology | Admitting: Anesthesiology

## 2017-02-21 ENCOUNTER — Other Ambulatory Visit: Payer: Self-pay

## 2017-02-21 VITALS — BP 126/82 | HR 101 | Temp 98.2°F | Resp 18 | Ht 65.5 in | Wt 233.5 lb

## 2017-02-21 DIAGNOSIS — R29898 Other symptoms and signs involving the musculoskeletal system: Secondary | ICD-10-CM | POA: Diagnosis not present

## 2017-02-21 DIAGNOSIS — M4696 Unspecified inflammatory spondylopathy, lumbar region: Secondary | ICD-10-CM

## 2017-02-21 DIAGNOSIS — M5441 Lumbago with sciatica, right side: Secondary | ICD-10-CM | POA: Diagnosis not present

## 2017-02-21 DIAGNOSIS — M542 Cervicalgia: Secondary | ICD-10-CM

## 2017-02-21 DIAGNOSIS — Z79899 Other long term (current) drug therapy: Secondary | ICD-10-CM | POA: Diagnosis not present

## 2017-02-21 DIAGNOSIS — G894 Chronic pain syndrome: Secondary | ICD-10-CM | POA: Diagnosis not present

## 2017-02-21 DIAGNOSIS — Z791 Long term (current) use of non-steroidal anti-inflammatories (NSAID): Secondary | ICD-10-CM | POA: Diagnosis not present

## 2017-02-21 DIAGNOSIS — M5136 Other intervertebral disc degeneration, lumbar region: Secondary | ICD-10-CM

## 2017-02-21 DIAGNOSIS — G8929 Other chronic pain: Secondary | ICD-10-CM

## 2017-02-21 DIAGNOSIS — R531 Weakness: Secondary | ICD-10-CM | POA: Diagnosis not present

## 2017-02-21 DIAGNOSIS — M5442 Lumbago with sciatica, left side: Secondary | ICD-10-CM

## 2017-02-21 DIAGNOSIS — M79602 Pain in left arm: Secondary | ICD-10-CM

## 2017-02-21 DIAGNOSIS — M79601 Pain in right arm: Secondary | ICD-10-CM

## 2017-02-21 DIAGNOSIS — M7918 Myalgia, other site: Secondary | ICD-10-CM

## 2017-02-21 DIAGNOSIS — Z79891 Long term (current) use of opiate analgesic: Secondary | ICD-10-CM | POA: Insufficient documentation

## 2017-02-21 DIAGNOSIS — M47816 Spondylosis without myelopathy or radiculopathy, lumbar region: Secondary | ICD-10-CM

## 2017-02-21 MED ORDER — OXYCODONE HCL 5 MG PO TABS: 5.0000 mg | ORAL_TABLET | Freq: Four times a day (QID) | ORAL | 0 refills | Status: DC | PRN

## 2017-02-21 MED ORDER — METHOCARBAMOL 750 MG PO TABS: 750.0000 mg | ORAL_TABLET | Freq: Four times a day (QID) | ORAL | 3 refills | Status: DC

## 2017-02-21 NOTE — Progress Notes (Signed)
Subjective:  Patient ID: Hannah Maynard, female    DOB: 23-Aug-1965  Age: 51 y.o. MRN: [id number]  CC: Back Pain (low) and Leg Pain (right)   Procedure: None  HPI [employer] presents for reevaluation.  She was last seen in November.  The quality characteristic and distribution of her back pain has been about the same.  No significant changes are noted.  She is continue to take her medications as prescribed and these been well-tolerated based on her narcotic assessment sheet.  She maintains that she continues to get good relief from these.  She generally has to take them 4 times a day.  No evidence of diversion or illicit use is noted.  The quality characteristic and distribution of her lower back pain and leg pain are about the same.  No significant changes noted with no changes in bowel or bladder function.  Outpatient Medications Prior to Visit  Medication Sig Dispense Refill  . ibuprofen (ADVIL,MOTRIN) 200 MG tablet Take 200 mg by mouth every 6 (six) hours as needed.    . Nerve Stimulator (ZEWA TENS/EMS COMBO UNIT) DEVI by Does not apply route.    Marland Kitchen PARoxetine (PAXIL) 40 MG tablet Take 0.5 tablets (20 mg total) by mouth at bedtime. 30 tablet 6  . pregabalin (LYRICA) 75 MG capsule Take 2 capsules (150 mg total) by mouth 2 (two) times daily. 120 capsule 5  . methocarbamol (ROBAXIN) 750 MG tablet Take 1 tablet (750 mg total) by mouth 4 (four) times daily. 90 tablet 2  . oxyCODONE (OXY IR/ROXICODONE) 5 MG immediate release tablet Take 1 tablet (5 mg total) by mouth every 6 (six) hours as needed for severe pain. 120 tablet 0   Facility-Administered Medications Prior to Visit  Medication Dose Route Frequency Provider Last Rate Last Dose  . iopamidol (ISOVUE-M) 41 % intrathecal injection 20 mL  20 mL Other Once PRN Molli Barrows, MD      . lactated ringers infusion 1,000 mL  1,000 mL Intravenous Continuous Molli Barrows, MD      . lidocaine (PF) (XYLOCAINE) 1 % injection 5 mL  5 mL  Subcutaneous Once Molli Barrows, MD      . midazolam (VERSED) injection 5 mg  5 mg Intravenous Once Molli Barrows, MD      . ropivacaine (PF) 2 mg/mL (0.2%) (NAROPIN) injection 10 mL  10 mL Epidural Once Molli Barrows, MD      . sodium chloride flush (NS) 0.9 % injection 10 mL  10 mL Other Once Molli Barrows, MD      . triamcinolone acetonide (KENALOG-40) injection 40 mg  40 mg Other Once Molli Barrows, MD        Review of Systems CNS: No confusion or sedation Cardiac: No angina or palpitations GI: No abdominal pain or constipation CNS: No sedation or confusion Cardiac: No angina or palpitations GI: No constipation or abdominal pain  Objective:  BP 126/82   Pulse (!) 101   Temp 98.2 F (36.8 C) (Oral)   Resp 18   Ht 5' 5.5" (1.664 m)   Wt 233 lb 8 oz (105.9 kg)   LMP  (LMP Unknown)   SpO2 99%   BMI 38.27 kg/m    BP Readings from Last 3 Encounters:  02/21/17 126/82  01/22/17 124/71  01/13/17 (!) 156/93     Wt Readings from Last 3 Encounters:  02/21/17 233 lb 8 oz (105.9 kg)  01/22/17 230 lb (  104.3 kg)  01/13/17 230 lb (104.3 kg)     Physical Exam Pt is alert and oriented PERRL EOMI HEART IS RRR no murmur or rub LCTA no wheezing or rales MUSCULOSKELETAL reveals some paraspinous muscle tenderness but no overt trigger points.  Labs  No results found for: HGBA1C Lab Results  Component Value Date   CREATININE 0.83 01/13/2017    -------------------------------------------------------------------------------------------------------------------- Lab Results  Component Value Date   GLUCOSE 74 01/13/2017   AST 39 01/13/2017   NA 143 01/13/2017   K 4.3 01/13/2017   CL 105 01/13/2017   CREATININE 0.83 01/13/2017   BUN 15 01/13/2017    --------------------------------------------------------------------------------------------------------------------- Dg C-arm 1-60 Min-no Report  Result Date: 01/22/2017 Fluoroscopy was utilized by the requesting  physician.  No radiographic interpretation.     Assessment & Plan:   Hannah Maynard was seen today for back pain and leg pain.  Diagnoses and all orders for this visit:  Chronic bilateral low back pain with bilateral sciatica  Chronic pain of both upper extremities  Chronic neck pain  Chronic pain syndrome -     Discontinue: oxyCODONE (OXY IR/ROXICODONE) 5 MG immediate release tablet; Take 1 tablet (5 mg total) by mouth every 6 (six) hours as needed for severe pain. -     oxyCODONE (OXY IR/ROXICODONE) 5 MG immediate release tablet; Take 1 tablet (5 mg total) by mouth every 6 (six) hours as needed for severe pain.  Other long term (current) drug therapy  Weakness of both lower extremities  Facet arthritis of lumbar region ([place])  DDD (degenerative disc disease), lumbar  Musculoskeletal pain, chronic  Other orders -     methocarbamol (ROBAXIN) 750 MG tablet; Take 1 tablet (750 mg total) by mouth 4 (four) times daily.        ----------------------------------------------------------------------------------------------------------------------  Problem List Items Addressed This Visit      Unprioritized   Chronic low back pain - Primary (Chronic)   Relevant Medications   oxyCODONE (OXY IR/ROXICODONE) 5 MG immediate release tablet   methocarbamol (ROBAXIN) 750 MG tablet   Chronic neck pain   Relevant Medications   oxyCODONE (OXY IR/ROXICODONE) 5 MG immediate release tablet   methocarbamol (ROBAXIN) 750 MG tablet   Chronic pain syndrome   Relevant Medications   oxyCODONE (OXY IR/ROXICODONE) 5 MG immediate release tablet   Chronic upper extremity pain (Chronic)   Relevant Medications   oxyCODONE (OXY IR/ROXICODONE) 5 MG immediate release tablet   methocarbamol (ROBAXIN) 750 MG tablet   Other long term (current) drug therapy    Other Visit Diagnoses    Weakness of both lower extremities       Facet arthritis of lumbar region ([facility])       Relevant Medications   oxyCODONE  (OXY IR/ROXICODONE) 5 MG immediate release tablet   methocarbamol (ROBAXIN) 750 MG tablet   DDD (degenerative disc disease), lumbar       Relevant Medications   oxyCODONE (OXY IR/ROXICODONE) 5 MG immediate release tablet   methocarbamol (ROBAXIN) 750 MG tablet   Musculoskeletal pain, chronic            ----------------------------------------------------------------------------------------------------------------------  1. Chronic bilateral low back pain with bilateral sciatica We will defer on any reinjection today.  She may be a candidate for repeat series of epidurals in the near future.  In the meantime I want her to continue with efforts at weight loss and back stretching strengthening exercises.  2. Chronic pain of both upper extremities   3. Chronic neck pain I talked  to her about an orthotic pillow and some stretching exercises  4. Chronic pain syndrome Is on the chronicity of her pain and the severity we will continue her on her chronic opioid therapy.  This seems to be well-tolerated and based on her narcotic assessment sheet seems to be appropriate.  We reviewed the [facility] practitioner database information and it is appropriate.  Refills are given for December 29 and January 28. - oxyCODONE (OXY IR/ROXICODONE) 5 MG immediate release tablet; Take 1 tablet (5 mg total) by mouth every 6 (six) hours as needed for severe pain.  Dispense: 120 tablet; Refill: 0  5. Other long term (current) drug therapy   6. Weakness of both lower extremities   7. Facet arthritis of lumbar region ([place])   8. DDD (degenerative disc disease), lumbar   9. Musculoskeletal pain, chronic    ----------------------------------------------------------------------------------------------------------------------  I am having Hannah Maynard maintain her ibuprofen, ZEWA TENS/EMS COMBO UNIT, PARoxetine, pregabalin, oxyCODONE, and methocarbamol. We will continue to administer triamcinolone  acetonide, sodium chloride flush, ropivacaine (PF) 2 mg/mL (0.2%), midazolam, lidocaine (PF), lactated ringers, and iopamidol.   Meds ordered this encounter  Medications  . DISCONTD: oxyCODONE (OXY IR/ROXICODONE) 5 MG immediate release tablet    Sig: Take 1 tablet (5 mg total) by mouth every 6 (six) hours as needed for severe pain.    Dispense:  120 tablet    Refill:  0    Do not fill until [id number]  . oxyCODONE (OXY IR/ROXICODONE) 5 MG immediate release tablet    Sig: Take 1 tablet (5 mg total) by mouth every 6 (six) hours as needed for severe pain.    Dispense:  120 tablet    Refill:  0    Do not fill until [id number]  . methocarbamol (ROBAXIN) 750 MG tablet    Sig: Take 1 tablet (750 mg total) by mouth 4 (four) times daily.    Dispense:  90 tablet    Refill:  3     Medication List        Accurate as of 02/21/17 12:28 PM. Always use your most recent med list.          ibuprofen 200 MG tablet Commonly known as:  ADVIL,MOTRIN   methocarbamol 750 MG tablet Commonly known as:  ROBAXIN Take 1 tablet (750 mg total) by mouth 4 (four) times daily.   oxyCODONE 5 MG immediate release tablet Commonly known as:  Oxy IR/ROXICODONE Take 1 tablet (5 mg total) by mouth every 6 (six) hours as needed for severe pain.   PARoxetine 40 MG tablet Commonly known as:  PAXIL Take 0.5 tablets (20 mg total) by mouth at bedtime.   pregabalin 75 MG capsule Commonly known as:  LYRICA Take 2 capsules (150 mg total) by mouth 2 (two) times daily.   ZEWA TENS/EMS COMBO UNIT Devi       Where to Get Your Medications    These medications were sent to [facility] ([address]), [address], [place] [address] [place] [address]   Phone:  [phone]   methocarbamol 750 MG tablet   You can get these medications from any pharmacy   Bring a paper prescription for each of these medications  oxyCODONE 5 MG immediate release tablet     ----------------------------------------------------------------------------------------------------------------------  Follow-up: Return in about 2 months (around 04/24/2017) for evaluation, med refill.    Molli Barrows, MD

## 2017-02-21 NOTE — Progress Notes (Signed)
Nursing Pain Medication Assessment:  Safety precautions to be maintained throughout the outpatient stay will include: orient to surroundings, keep bed in low position, maintain call bell within reach at all times, provide assistance with transfer out of bed and ambulation.  Medication Inspection Compliance: Pill count conducted under aseptic conditions, in front of the patient. Neither the pills nor the bottle was removed from the patient's sight at any time. Once count was completed pills were immediately returned to the patient in their original bottle.  Medication #1: Oxycodone IR Pill/Patch Count: 3 of 120 pills remain Pill/Patch Appearance: Markings consistent with prescribed medication Bottle Appearance: Standard pharmacy container. Clearly labeled. Filled Date: 87 / 29 / 2018 Last Medication intake:  Today  Medication #2: Tramadol (Ultram) Pill/Patch Count: 2 of 30 pills remain Pill/Patch Appearance: Markings consistent with prescribed medication Bottle Appearance: Standard pharmacy container. Clearly labeled. Filled Date: 19 / 23 / 2018 Last Medication intake:  Today

## 2017-03-13 ENCOUNTER — Telehealth: Payer: Self-pay

## 2017-03-13 NOTE — Telephone Encounter (Signed)
ATTEMPTED TO REACH PATIENT, NO ANSWER AND NO VOICEMAIL CAPABILITY

## 2017-03-14 NOTE — Telephone Encounter (Signed)
Patient called to ask about note stating she cannot work, which was written by Reche Dixon,  I informed patient that the physician who took her out of work would have to cover this. Verified this with Lori P. Our phys. Doesn't cover this if he did not take patient out of work.

## 2017-03-24 ENCOUNTER — Telehealth: Payer: Self-pay | Admitting: *Deleted

## 2017-03-24 NOTE — Telephone Encounter (Signed)
PA request for Oxycodone sent.

## 2017-03-26 ENCOUNTER — Telehealth: Payer: Self-pay | Admitting: Anesthesiology

## 2017-03-26 NOTE — Telephone Encounter (Signed)
Pain in lower back and both legs. Wants procedure.

## 2017-03-26 NOTE — Telephone Encounter (Signed)
Patient in a lot of pain and still unable to get medications, wants to come for some kind of procedure today, please call asap

## 2017-03-27 ENCOUNTER — Encounter: Payer: Self-pay | Admitting: Nurse Practitioner

## 2017-03-27 ENCOUNTER — Ambulatory Visit: Payer: Medicaid Other | Attending: Nurse Practitioner | Admitting: Nurse Practitioner

## 2017-03-27 ENCOUNTER — Other Ambulatory Visit: Payer: Self-pay

## 2017-03-27 VITALS — BP 147/93 | HR 78 | Temp 98.9°F | Resp 16 | Ht 65.0 in | Wt 234.0 lb

## 2017-03-27 DIAGNOSIS — M5441 Lumbago with sciatica, right side: Secondary | ICD-10-CM | POA: Diagnosis not present

## 2017-03-27 DIAGNOSIS — M542 Cervicalgia: Secondary | ICD-10-CM | POA: Insufficient documentation

## 2017-03-27 DIAGNOSIS — M47816 Spondylosis without myelopathy or radiculopathy, lumbar region: Secondary | ICD-10-CM | POA: Diagnosis not present

## 2017-03-27 DIAGNOSIS — Z880 Allergy status to penicillin: Secondary | ICD-10-CM | POA: Insufficient documentation

## 2017-03-27 DIAGNOSIS — M25551 Pain in right hip: Secondary | ICD-10-CM | POA: Diagnosis not present

## 2017-03-27 DIAGNOSIS — F419 Anxiety disorder, unspecified: Secondary | ICD-10-CM | POA: Diagnosis not present

## 2017-03-27 DIAGNOSIS — M4726 Other spondylosis with radiculopathy, lumbar region: Secondary | ICD-10-CM | POA: Diagnosis not present

## 2017-03-27 DIAGNOSIS — E669 Obesity, unspecified: Secondary | ICD-10-CM | POA: Insufficient documentation

## 2017-03-27 DIAGNOSIS — Z9889 Other specified postprocedural states: Secondary | ICD-10-CM | POA: Insufficient documentation

## 2017-03-27 DIAGNOSIS — M5442 Lumbago with sciatica, left side: Secondary | ICD-10-CM | POA: Insufficient documentation

## 2017-03-27 DIAGNOSIS — Z79899 Other long term (current) drug therapy: Secondary | ICD-10-CM | POA: Diagnosis not present

## 2017-03-27 DIAGNOSIS — Z6838 Body mass index (BMI) 38.0-38.9, adult: Secondary | ICD-10-CM | POA: Diagnosis not present

## 2017-03-27 DIAGNOSIS — Z888 Allergy status to other drugs, medicaments and biological substances status: Secondary | ICD-10-CM | POA: Diagnosis not present

## 2017-03-27 DIAGNOSIS — M7918 Myalgia, other site: Secondary | ICD-10-CM | POA: Diagnosis not present

## 2017-03-27 DIAGNOSIS — G8929 Other chronic pain: Secondary | ICD-10-CM | POA: Diagnosis not present

## 2017-03-27 DIAGNOSIS — Z803 Family history of malignant neoplasm of breast: Secondary | ICD-10-CM | POA: Diagnosis not present

## 2017-03-27 DIAGNOSIS — G894 Chronic pain syndrome: Secondary | ICD-10-CM | POA: Insufficient documentation

## 2017-03-27 DIAGNOSIS — Z800 Family history of malignant neoplasm of digestive organs: Secondary | ICD-10-CM | POA: Diagnosis not present

## 2017-03-27 DIAGNOSIS — F329 Major depressive disorder, single episode, unspecified: Secondary | ICD-10-CM | POA: Diagnosis not present

## 2017-03-27 DIAGNOSIS — F17200 Nicotine dependence, unspecified, uncomplicated: Secondary | ICD-10-CM | POA: Insufficient documentation

## 2017-03-27 MED ORDER — KETOROLAC TROMETHAMINE 60 MG/2ML IM SOLN
60.0000 mg | Freq: Once | INTRAMUSCULAR | Status: AC
Start: 2017-03-27 — End: 2017-03-27
  Administered 2017-03-27: 60 mg via INTRAMUSCULAR

## 2017-03-27 MED ORDER — ORPHENADRINE CITRATE 30 MG/ML IJ SOLN
30.0000 mg | Freq: Once | INTRAMUSCULAR | Status: AC
  Administered 2017-03-27: 60 mg via INTRAMUSCULAR

## 2017-03-27 MED ORDER — KETOROLAC TROMETHAMINE 60 MG/2ML IM SOLN
INTRAMUSCULAR | Status: AC
  Filled 2017-03-27: qty 2

## 2017-03-27 MED ORDER — ORPHENADRINE CITRATE 30 MG/ML IJ SOLN
INTRAMUSCULAR | Status: AC
  Filled 2017-03-27: qty 2
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
## 2017-03-27 NOTE — Progress Notes (Signed)
Safety precautions to be maintained throughout the outpatient stay will include: orient to surroundings, keep bed in low position, maintain call bell within reach at all times, provide assistance with transfer out of bed and ambulation.  

## 2017-03-27 NOTE — Patient Instructions (Signed)

## 2017-03-27 NOTE — Addendum Note (Signed)
Addended by: Vevelyn Francois on: 03/27/2017 02:55 PM   Modules accepted: Level of Service

## 2017-04-16 ENCOUNTER — Ambulatory Visit: Payer: Medicaid Other | Attending: Anesthesiology | Admitting: Anesthesiology

## 2017-04-16 ENCOUNTER — Encounter: Payer: Self-pay | Admitting: Anesthesiology

## 2017-04-16 VITALS — BP 136/84 | HR 84 | Temp 98.2°F | Resp 18 | Ht 65.5 in

## 2017-04-16 DIAGNOSIS — M5442 Lumbago with sciatica, left side: Secondary | ICD-10-CM | POA: Diagnosis not present

## 2017-04-16 DIAGNOSIS — G8929 Other chronic pain: Secondary | ICD-10-CM

## 2017-04-16 DIAGNOSIS — M5441 Lumbago with sciatica, right side: Secondary | ICD-10-CM | POA: Diagnosis not present

## 2017-04-16 DIAGNOSIS — M47816 Spondylosis without myelopathy or radiculopathy, lumbar region: Secondary | ICD-10-CM | POA: Diagnosis not present

## 2017-04-16 DIAGNOSIS — M5030 Other cervical disc degeneration, unspecified cervical region: Secondary | ICD-10-CM

## 2017-04-16 DIAGNOSIS — F1190 Opioid use, unspecified, uncomplicated: Secondary | ICD-10-CM | POA: Diagnosis not present

## 2017-04-16 DIAGNOSIS — M542 Cervicalgia: Secondary | ICD-10-CM

## 2017-04-16 DIAGNOSIS — G894 Chronic pain syndrome: Secondary | ICD-10-CM | POA: Diagnosis not present

## 2017-04-16 LAB — GLUCOSE, CAPILLARY: GLUCOSE-CAPILLARY: 92 mg/dL (ref 65–99)

## 2017-04-16 MED ORDER — OXYCODONE HCL 5 MG PO TABS: 5.0000 mg | ORAL_TABLET | Freq: Four times a day (QID) | ORAL | 0 refills | Status: DC | PRN

## 2017-04-16 MED ORDER — ORPHENADRINE CITRATE 30 MG/ML IJ SOLN: 60.0000 mg | Freq: Once | INTRAMUSCULAR | Status: DC

## 2017-04-16 MED ORDER — KETOROLAC TROMETHAMINE 60 MG/2ML IM SOLN: 60.0000 mg | Freq: Once | INTRAMUSCULAR | Status: AC

## 2017-04-16 NOTE — Progress Notes (Signed)
Subjective:  Patient ID: Hannah Maynard, female    DOB: 08-Sep-1965  Age: 52 y.o. MRN: [id number]  CC: Back Pain (low) and Leg Pain   Procedure: None  HPI Hannah Maynard presents for reevaluation.  She is last seen 2 months ago and had a routine evaluation.  She complains that the medications are not giving her adequate pain relief in the low back area.  She continues to have pain that radiates into the posterior buttocks and into the back of the legs.  Occasionally she gets some calf cramping.  She is also having problems with neck pain.  Is primarily posterior neck with rotational movement of her head.  She denies any upper extremity weakness but the pain is been, quite recalcitrant and despite conservative therapy the pain remains severe.  She is been taking her oxycodone 4 times a day at the 5 mg strength with no diverting or illicit use.  She also maintains that she has not had any side effects with the medication and based on her narcotic assessment sheet she continues to derive good functional improvement with her medicines.  She is also somewhat tearful regarding the status of her son and his ability to graduate with some other personal issues apparently at hand.  Outpatient Medications Prior to Visit  Medication Sig Dispense Refill  . ibuprofen (ADVIL,MOTRIN) 200 MG tablet Take 200 mg by mouth every 6 (six) hours as needed.    . methocarbamol (ROBAXIN) 750 MG tablet Take 1 tablet (750 mg total) by mouth 4 (four) times daily. 90 tablet 3  . Nerve Stimulator (ZEWA TENS/EMS COMBO UNIT) DEVI by Does not apply route.    Marland Kitchen PARoxetine (PAXIL) 40 MG tablet Take 0.5 tablets (20 mg total) by mouth at bedtime. 30 tablet 6  . pregabalin (LYRICA) 75 MG capsule Take 2 capsules (150 mg total) by mouth 2 (two) times daily. 120 capsule 5  . oxyCODONE (OXY IR/ROXICODONE) 5 MG immediate release tablet Take 1 tablet (5 mg total) by mouth every 6 (six) hours as needed for severe pain. 120 tablet 0    Facility-Administered Medications Prior to Visit  Medication Dose Route Frequency Provider Last Rate Last Dose  . iopamidol (ISOVUE-M) 41 % intrathecal injection 20 mL  20 mL Other Once PRN Molli Barrows, MD      . lactated ringers infusion 1,000 mL  1,000 mL Intravenous Continuous Molli Barrows, MD      . lidocaine (PF) (XYLOCAINE) 1 % injection 5 mL  5 mL Subcutaneous Once Molli Barrows, MD      . midazolam (VERSED) injection 5 mg  5 mg Intravenous Once Molli Barrows, MD      . ropivacaine (PF) 2 mg/mL (0.2%) (NAROPIN) injection 10 mL  10 mL Epidural Once Molli Barrows, MD      . sodium chloride flush (NS) 0.9 % injection 10 mL  10 mL Other Once Molli Barrows, MD      . triamcinolone acetonide (KENALOG-40) injection 40 mg  40 mg Other Once Molli Barrows, MD        Review of Systems CNS: No confusion or sedation Cardiac: No angina or palpitations GI: No abdominal pain or constipation Constitutional: No nausea vomiting fevers or chills  Objective:  BP 136/84   Pulse 84   Temp 98.2 F (36.8 C) (Oral)   Resp 18   Ht 5' 5.5" (1.664 m)   LMP  (LMP Unknown)   SpO2 100%  BMI 38.35 kg/m    BP Readings from Last 3 Encounters:  04/16/17 136/84  03/27/17 (!) 147/93  02/21/17 126/82     Wt Readings from Last 3 Encounters:  03/27/17 234 lb (106.1 kg)  02/21/17 233 lb 8 oz (105.9 kg)  01/22/17 230 lb (104.3 kg)     Physical Exam Pt is alert and oriented PERRL EOMI HEART IS RRR no murmur or rub LCTA no wheezing or rales MUSCULOSKELETAL reveals some tenderness in the neck but she is moving her upper extremities without difficulty with good muscle tone and bulk.  Furthermore in relation to her low back she has some mild paraspinous muscle tenderness in the lumbar region bilaterally but her lower extremity strength appears to be well preserved with good muscle tone and bulk as well.  She does have a positive straight leg raise on the right side.  Negative on the left.   This does cause some calf cramping Labs  No results found for: HGBA1C Lab Results  Component Value Date   CREATININE 0.83 01/13/2017    -------------------------------------------------------------------------------------------------------------------- Lab Results  Component Value Date   GLUCOSE 74 01/13/2017   AST 39 01/13/2017   NA 143 01/13/2017   K 4.3 01/13/2017   CL 105 01/13/2017   CREATININE 0.83 01/13/2017   BUN 15 01/13/2017    --------------------------------------------------------------------------------------------------------------------- Dg C-arm 1-60 Min-no Report  Result Date: 01/22/2017 Fluoroscopy was utilized by the requesting physician.  No radiographic interpretation.     Assessment & Plan:   Hannah Maynard was seen today for back pain and leg pain.  Diagnoses and all orders for this visit:  Chronic bilateral low back pain with bilateral sciatica -     Lumbar Epidural Injection; Future -     orphenadrine (NORFLEX) injection 60 mg -     ketorolac (TORADOL) injection 60 mg  Lumbar spondylosis -     orphenadrine (NORFLEX) injection 60 mg -     ketorolac (TORADOL) injection 60 mg  Chronic pain syndrome -     Discontinue: oxyCODONE (OXY IR/ROXICODONE) 5 MG immediate release tablet; Take 1 tablet (5 mg total) by mouth every 6 (six) hours as needed for severe pain. -     oxyCODONE (OXY IR/ROXICODONE) 5 MG immediate release tablet; Take 1 tablet (5 mg total) by mouth every 6 (six) hours as needed for severe pain. -     orphenadrine (NORFLEX) injection 60 mg -     ketorolac (TORADOL) injection 60 mg  Chronic, continuous use of opioids -     orphenadrine (NORFLEX) injection 60 mg -     ketorolac (TORADOL) injection 60 mg  DDD (degenerative disc disease), cervical -     Ambulatory referral to Neurosurgery -     orphenadrine (NORFLEX) injection 60 mg -     ketorolac (TORADOL) injection 60 mg  Cervicalgia -     Ambulatory referral to Neurosurgery -      orphenadrine (NORFLEX) injection 60 mg -     ketorolac (TORADOL) injection 60 mg  Other orders -     Glucose, capillary        ----------------------------------------------------------------------------------------------------------------------  Problem List Items Addressed This Visit      Unprioritized   Chronic low back pain - Primary (Chronic)   Relevant Medications   oxyCODONE (OXY IR/ROXICODONE) 5 MG immediate release tablet   orphenadrine (NORFLEX) injection 60 mg   ketorolac (TORADOL) injection 60 mg   Other Relevant Orders   Lumbar Epidural Injection   Chronic pain syndrome  Relevant Medications   oxyCODONE (OXY IR/ROXICODONE) 5 MG immediate release tablet   orphenadrine (NORFLEX) injection 60 mg   ketorolac (TORADOL) injection 60 mg   Lumbar spondylosis   Relevant Medications   oxyCODONE (OXY IR/ROXICODONE) 5 MG immediate release tablet   orphenadrine (NORFLEX) injection 60 mg   ketorolac (TORADOL) injection 60 mg    Other Visit Diagnoses    Chronic, continuous use of opioids       Relevant Medications   orphenadrine (NORFLEX) injection 60 mg   ketorolac (TORADOL) injection 60 mg   DDD (degenerative disc disease), cervical       Relevant Medications   oxyCODONE (OXY IR/ROXICODONE) 5 MG immediate release tablet   orphenadrine (NORFLEX) injection 60 mg   ketorolac (TORADOL) injection 60 mg   Other Relevant Orders   Ambulatory referral to Neurosurgery   Cervicalgia       Relevant Medications   orphenadrine (NORFLEX) injection 60 mg   ketorolac (TORADOL) injection 60 mg   Other Relevant Orders   Ambulatory referral to Neurosurgery        ----------------------------------------------------------------------------------------------------------------------  1. Chronic bilateral low back pain with bilateral sciatica I reviewed her MRI once again and based on her previous response to epidural steroids I think it would be reasonable to schedule her for  repeat injection.  She desires to do this.  I do not feel comfortable increasing her current dose of oxycodone. - Lumbar Epidural Injection; Future - orphenadrine (NORFLEX) injection 60 mg - ketorolac (TORADOL) injection 60 mg  2. Lumbar spondylosis Continue efforts at weight loss stretching strengthening as once again reviewed in detail today. - orphenadrine (NORFLEX) injection 60 mg - ketorolac (TORADOL) injection 60 mg  3. Chronic pain syndrome Reviewed the [facility] practitioner database information and it is appropriate.  We will give her refills today for February 26 and March 28.  This will be for oxycodone 5 mg tablets 4 times daily.  She is to return to clinic in approximately 1 month for her epidural injection. - oxyCODONE (OXY IR/ROXICODONE) 5 MG immediate release tablet; Take 1 tablet (5 mg total) by mouth every 6 (six) hours as needed for severe pain.  Dispense: 120 tablet; Refill: 0 - orphenadrine (NORFLEX) injection 60 mg - ketorolac (TORADOL) injection 60 mg  4. Chronic, continuous use of opioids Above - orphenadrine (NORFLEX) injection 60 mg - ketorolac (TORADOL) injection 60 mg  5. DDD (degenerative disc disease), cervical As above.  Secondary to the cervical intractable pain and going to refer her to Dr. Lacinda Axon to determine if she is a candidate for a cervical decompression. - Ambulatory referral to Neurosurgery - orphenadrine (NORFLEX) injection 60 mg - ketorolac (TORADOL) injection 60 mg  6. Cervicalgia As above - Ambulatory referral to Neurosurgery - orphenadrine (NORFLEX) injection 60 mg - ketorolac (TORADOL) injection 60 mg Approximately 15-20 minutes after my evaluation the patient did have a diaphoretic spell.  I was informed of this by the nurse and secondary to her history and the collective circumstances she was referred to the ER for  evaluation.   ----------------------------------------------------------------------------------------------------------------------  I am having Hannah Maynard maintain her ibuprofen, ZEWA TENS/EMS COMBO UNIT, PARoxetine, pregabalin, methocarbamol, and oxyCODONE. We will continue to administer triamcinolone acetonide, sodium chloride flush, ropivacaine (PF) 2 mg/mL (0.2%), midazolam, lidocaine (PF), lactated ringers, and iopamidol.   Meds ordered this encounter  Medications  . DISCONTD: oxyCODONE (OXY IR/ROXICODONE) 5 MG immediate release tablet    Sig: Take 1 tablet (5 mg total) by mouth  every 6 (six) hours as needed for severe pain.    Dispense:  120 tablet    Refill:  0    Do not fill until [id number]  . oxyCODONE (OXY IR/ROXICODONE) 5 MG immediate release tablet    Sig: Take 1 tablet (5 mg total) by mouth every 6 (six) hours as needed for severe pain.    Dispense:  120 tablet    Refill:  0    Do not fill until [id number]  . orphenadrine (NORFLEX) injection 60 mg  . ketorolac (TORADOL) injection 60 mg   Patient's Medications  New Prescriptions   No medications on file  Previous Medications   IBUPROFEN (ADVIL,MOTRIN) 200 MG TABLET    Take 200 mg by mouth every 6 (six) hours as needed.   METHOCARBAMOL (ROBAXIN) 750 MG TABLET    Take 1 tablet (750 mg total) by mouth 4 (four) times daily.   NERVE STIMULATOR (ZEWA TENS/EMS COMBO UNIT) DEVI    by Does not apply route.   PAROXETINE (PAXIL) 40 MG TABLET    Take 0.5 tablets (20 mg total) by mouth at bedtime.   PREGABALIN (LYRICA) 75 MG CAPSULE    Take 2 capsules (150 mg total) by mouth 2 (two) times daily.  Modified Medications   Modified Medication Previous Medication   OXYCODONE (OXY IR/ROXICODONE) 5 MG IMMEDIATE RELEASE TABLET oxyCODONE (OXY IR/ROXICODONE) 5 MG immediate release tablet      Take 1 tablet (5 mg total) by mouth every 6 (six) hours as needed for severe pain.    Take 1 tablet (5 mg total) by mouth every 6 (six) hours as  needed for severe pain.  Discontinued Medications   No medications on file   ----------------------------------------------------------------------------------------------------------------------  Follow-up: Return in about 1 month (around 05/14/2017) for evaluation, procedure.    Molli Barrows, MD

## 2017-04-16 NOTE — Patient Instructions (Signed)
Pain Management Discharge Instructions  General Discharge Instructions :  If you need to reach your doctor call: Monday-Friday 8:00 am - 4:00 pm at [phone] or toll free [phone].  After clinic hours [phone] to have operator reach doctor.  Bring all of your medication bottles to all your appointments in the pain clinic.  To cancel or reschedule your appointment with Pain Management please remember to call 24 hours in advance to avoid a fee.  Refer to the educational materials which you have been given on: General Risks, I had my Procedure. Discharge Instructions, Post Sedation.  Post Procedure Instructions:  The drugs you were given will stay in your system until tomorrow, so for the next 24 hours you should not drive, make any legal decisions or drink any alcoholic beverages.  You may eat anything you prefer, but it is better to start with liquids then soups and crackers, and gradually work up to solid foods.  Please notify your doctor immediately if you have any unusual bleeding, trouble breathing or pain that is not related to your normal pain.  Depending on the type of procedure that was done, some parts of your body may feel week and/or numb.  This usually clears up by tonight or the next day.  Walk with the use of an assistive device or accompanied by an adult for the 24 hours.  You may use ice on the affected area for the first 24 hours.  Put ice in a Ziploc bag and cover with a towel and place against area 15 minutes on 15 minutes off.  You may switch to heat after 24 hours.GENERAL RISKS AND COMPLICATIONS  What are the risk, side effects and possible complications? Generally speaking, most procedures are safe.  However, with any procedure there are risks, side effects, and the possibility of complications.  The risks and complications are dependent upon the sites that are lesioned, or the type of nerve block to be performed.  The closer the procedure is to the spine,  the more serious the risks are.  Great care is taken when placing the radio frequency needles, block needles or lesioning probes, but sometimes complications can occur. 1. Infection: Any time there is an injection through the skin, there is a risk of infection.  This is why sterile conditions are used for these blocks.  There are four possible types of infection. 1. Localized skin infection. 2. Central Nervous System Infection-This can be in the form of Meningitis, which can be deadly. 3. Epidural Infections-This can be in the form of an epidural abscess, which can cause pressure inside of the spine, causing compression of the spinal cord with subsequent paralysis. This would require an emergency surgery to decompress, and there are no guarantees that the patient would recover from the paralysis. 4. Discitis-This is an infection of the intervertebral discs.  It occurs in about 1% of discography procedures.  It is difficult to treat and it may lead to surgery.        2. Pain: the needles have to go through skin and soft tissues, will cause soreness.       3. Damage to internal structures:  The nerves to be lesioned may be near blood vessels or    other nerves which can be potentially damaged.       4. Bleeding: Bleeding is more common if the patient is taking blood thinners such as  aspirin, Coumadin, Ticiid, Plavix, etc., or if he/she have some genetic predisposition  such as   hemophilia. Bleeding into the spinal canal can cause compression of the spinal  cord with subsequent paralysis.  This would require an emergency surgery to  decompress and there are no guarantees that the patient would recover from the  paralysis.       5. Pneumothorax:  Puncturing of a lung is a possibility, every time a needle is introduced in  the area of the chest or upper back.  Pneumothorax refers to free air around the  collapsed lung(s), inside of the thoracic cavity (chest cavity).  Another two possible  complications  related to a similar event would include: Hemothorax and Chylothorax.   These are variations of the Pneumothorax, where instead of air around the collapsed  lung(s), you may have blood or chyle, respectively.       6. Spinal headaches: They may occur with any procedures in the area of the spine.       7. Persistent CSF (Cerebro-Spinal Fluid) leakage: This is a rare problem, but may occur  with prolonged intrathecal or epidural catheters either due to the formation of a fistulous  track or a dural tear.       8. Nerve damage: By working so close to the spinal cord, there is always a possibility of  nerve damage, which could be as serious as a permanent spinal cord injury with  paralysis.       9. Death:  Although rare, severe deadly allergic reactions known as "Anaphylactic  reaction" can occur to any of the medications used.      10. Worsening of the symptoms:  We can always make thing worse.  What are the chances of something like this happening? Chances of any of this occuring are extremely low.  By statistics, you have more of a chance of getting killed in a motor vehicle accident: while driving to the hospital than any of the above occurring .  Nevertheless, you should be aware that they are possibilities.  In general, it is similar to taking a shower.  Everybody knows that you can slip, hit your head and get killed.  Does that mean that you should not shower again?  Nevertheless always keep in mind that statistics do not mean anything if you happen to be on the wrong side of them.  Even if a procedure has a 1 (one) in a 1,000,000 (million) chance of going wrong, it you happen to be that one..Also, keep in mind that by statistics, you have more of a chance of having something go wrong when taking medications.  Who should not have this procedure? If you are on a blood thinning medication (e.g. Coumadin, Plavix, see list of "Blood Thinners"), or if you have an active infection going on, you should not  have the procedure.  If you are taking any blood thinners, please inform your physician.  How should I prepare for this procedure?  Do not eat or drink anything at least six hours prior to the procedure.  Bring a driver with you .  It cannot be a taxi.  Come accompanied by an adult that can drive you back, and that is strong enough to help you if your legs get weak or numb from the local anesthetic.  Take all of your medicines the morning of the procedure with just enough water to swallow them.  If you have diabetes, make sure that you are scheduled to have your procedure done first thing in the morning, whenever possible.  If you have diabetes,   take only half of your insulin dose and notify our nurse that you have done so as soon as you arrive at the clinic.  If you are diabetic, but only take blood sugar pills (oral hypoglycemic), then do not take them on the morning of your procedure.  You may take them after you have had the procedure.  Do not take aspirin or any aspirin-containing medications, at least eleven (11) days prior to the procedure.  They may prolong bleeding.  Wear loose fitting clothing that may be easy to take off and that you would not mind if it got stained with Betadine or blood.  Do not wear any jewelry or perfume  Remove any nail coloring.  It will interfere with some of our monitoring equipment.  NOTE: Remember that this is not meant to be interpreted as a complete list of all possible complications.  Unforeseen problems may occur.  BLOOD THINNERS The following drugs contain aspirin or other products, which can cause increased bleeding during surgery and should not be taken for 2 weeks prior to and 1 week after surgery.  If you should need take something for relief of minor pain, you may take acetaminophen which is found in Tylenol,m Datril, Anacin-3 and Panadol. It is not blood thinner. The products listed below are.  Do not take any of the products listed below  in addition to any listed on your instruction sheet.  A.P.C or A.P.C with Codeine Codeine Phosphate Capsules #3 Ibuprofen Ridaura  ABC compound Congesprin Imuran rimadil  Advil Cope Indocin Robaxisal  Alka-Seltzer Effervescent Pain Reliever and Antacid Coricidin or Coricidin-D  Indomethacin Rufen  Alka-Seltzer plus Cold Medicine Cosprin Ketoprofen S-A-C Tablets  Anacin Analgesic Tablets or Capsules Coumadin Korlgesic Salflex  Anacin Extra Strength Analgesic tablets or capsules CP-2 Tablets Lanoril Salicylate  Anaprox Cuprimine Capsules Levenox Salocol  Anexsia-D Dalteparin Magan Salsalate  Anodynos Darvon compound Magnesium Salicylate Sine-off  Ansaid Dasin Capsules Magsal Sodium Salicylate  Anturane Depen Capsules Marnal Soma  APF Arthritis pain formula Dewitt's Pills Measurin Stanback  Argesic Dia-Gesic Meclofenamic Sulfinpyrazone  Arthritis Bayer Timed Release Aspirin Diclofenac Meclomen Sulindac  Arthritis pain formula Anacin Dicumarol Medipren Supac  Analgesic (Safety coated) Arthralgen Diffunasal Mefanamic Suprofen  Arthritis Strength Bufferin Dihydrocodeine Mepro Compound Suprol  Arthropan liquid Dopirydamole Methcarbomol with Aspirin Synalgos  ASA tablets/Enseals Disalcid Micrainin Tagament  Ascriptin Doan's Midol Talwin  Ascriptin A/D Dolene Mobidin Tanderil  Ascriptin Extra Strength Dolobid Moblgesic Ticlid  Ascriptin with Codeine Doloprin or Doloprin with Codeine Momentum Tolectin  Asperbuf Duoprin Mono-gesic Trendar  Aspergum Duradyne Motrin or Motrin IB Triminicin  Aspirin plain, buffered or enteric coated Durasal Myochrisine Trigesic  Aspirin Suppositories Easprin Nalfon Trillsate  Aspirin with Codeine Ecotrin Regular or Extra Strength Naprosyn Uracel  Atromid-S Efficin Naproxen Ursinus  Auranofin Capsules Elmiron Neocylate Vanquish  Axotal Emagrin Norgesic Verin  Azathioprine Empirin or Empirin with Codeine Normiflo Vitamin E  Azolid Emprazil Nuprin Voltaren  Bayer  Aspirin plain, buffered or children's or timed BC Tablets or powders Encaprin Orgaran Warfarin Sodium  Buff-a-Comp Enoxaparin Orudis Zorpin  Buff-a-Comp with Codeine Equegesic Os-Cal-Gesic   Buffaprin Excedrin plain, buffered or Extra Strength Oxalid   Bufferin Arthritis Strength Feldene Oxphenbutazone   Bufferin plain or Extra Strength Feldene Capsules Oxycodone with Aspirin   Bufferin with Codeine Fenoprofen Fenoprofen Pabalate or Pabalate-SF   Buffets II Flogesic Panagesic   Buffinol plain or Extra Strength Florinal or Florinal with Codeine Panwarfarin   Buf-Tabs Flurbiprofen Penicillamine   Butalbital Compound Four-way cold tablets   Penicillin   Butazolidin Fragmin Pepto-Bismol   Carbenicillin Geminisyn Percodan   Carna Arthritis Reliever Geopen Persantine   Carprofen Gold's salt Persistin   Chloramphenicol Goody's Phenylbutazone   Chloromycetin Haltrain Piroxlcam   Clmetidine heparin Plaquenil   Cllnoril Hyco-pap Ponstel   Clofibrate Hydroxy chloroquine Propoxyphen         Before stopping any of these medications, be sure to consult the physician who ordered them.  Some, such as Coumadin (Warfarin) are ordered to prevent or treat serious conditions such as "deep thrombosis", "pumonary embolisms", and other heart problems.  The amount of time that you may need off of the medication may also vary with the medication and the reason for which you were taking it.  If you are taking any of these medications, please make sure you notify your pain physician before you undergo any procedures.         Epidural Steroid Injection Patient Information  Description: The epidural space surrounds the nerves as they exit the spinal cord.  In some patients, the nerves can be compressed and inflamed by a bulging disc or a tight spinal canal (spinal stenosis).  By injecting steroids into the epidural space, we can bring irritated nerves into direct contact with a potentially helpful medication.   These steroids act directly on the irritated nerves and can reduce swelling and inflammation which often leads to decreased pain.  Epidural steroids may be injected anywhere along the spine and from the neck to the low back depending upon the location of your pain.   After numbing the skin with local anesthetic (like Novocaine), a small needle is passed into the epidural space slowly.  You may experience a sensation of pressure while this is being done.  The entire block usually last less than 10 minutes.  Conditions which may be treated by epidural steroids:   Low back and leg pain  Neck and arm pain  Spinal stenosis  Post-laminectomy syndrome  Herpes zoster (shingles) pain  Pain from compression fractures  Preparation for the injection:  1. Do not eat any solid food or dairy products within 8 hours of your appointment.  2. You may drink clear liquids up to 3 hours before appointment.  Clear liquids include water, black coffee, juice or soda.  No milk or cream please. 3. You may take your regular medication, including pain medications, with a sip of water before your appointment  Diabetics should hold regular insulin (if taken separately) and take 1/2 normal NPH dos the morning of the procedure.  Carry some sugar containing items with you to your appointment. 4. A driver must accompany you and be prepared to drive you home after your procedure.  5. Bring all your current medications with your. 6. An IV may be inserted and sedation may be given at the discretion of the physician.   7. A blood pressure cuff, EKG and other monitors will often be applied during the procedure.  Some patients may need to have extra oxygen administered for a short period. 8. You will be asked to provide medical information, including your allergies, prior to the procedure.  We must know immediately if you are taking blood thinners (like Coumadin/Warfarin)  Or if you are allergic to IV iodine contrast (dye). We  must know if you could possible be pregnant.  Possible side-effects:  Bleeding from needle site  Infection (rare, may require surgery)  Nerve injury (rare)  Numbness & tingling (temporary)  Difficulty urinating (rare, temporary)  Spinal headache (   a headache worse with upright posture)  Light -headedness (temporary)  Pain at injection site (several days)  Decreased blood pressure (temporary)  Weakness in arm/leg (temporary)  Pressure sensation in back/neck (temporary)  Call if you experience:  Fever/chills associated with headache or increased back/neck pain.  Headache worsened by an upright position.  New onset weakness or numbness of an extremity below the injection site  Hives or difficulty breathing (go to the emergency room)  Inflammation or drainage at the infection site  Severe back/neck pain  Any new symptoms which are concerning to you  Please note:  Although the local anesthetic injected can often make your back or neck feel good for several hours after the injection, the pain will likely return.  It takes 3-7 days for steroids to work in the epidural space.  You may not notice any pain relief for at least that one week.  If effective, we will often do a series of three injections spaced 3-6 weeks apart to maximally decrease your pain.  After the initial series, we generally will wait several months before considering a repeat injection of the same type.  If you have any questions, please call [phone] [facility] 

## 2017-04-16 NOTE — Progress Notes (Signed)
Nursing Pain Medication Assessment:  Safety precautions to be maintained throughout the outpatient stay will include: orient to surroundings, keep bed in low position, maintain call bell within reach at all times, provide assistance with transfer out of bed and ambulation.  Medication Inspection Compliance: Pill count conducted under aseptic conditions, in front of the patient. Neither the pills nor the bottle was removed from the patient's sight at any time. Once count was completed pills were immediately returned to the patient in their original bottle.  Medication: Oxycodone IR Pill/Patch Count: 42 of 120 pills remain Pill/Patch Appearance: Markings consistent with prescribed medication Bottle Appearance: Standard pharmacy container. Clearly labeled. Filled Date: 01 / 27 / 2019 Last Medication intake:  Today

## 2017-04-16 NOTE — Progress Notes (Signed)
Fingerstick [phone] Dr. Andree Elk called and instructed to send to ED for evaluation. Discussed this with patient at length. Patient refuses and states she feels better. Denies any symptoms at this time. Encouraged patient to be checked out by ED but she is declining the offer.

## 2017-04-28 ENCOUNTER — Other Ambulatory Visit: Payer: Self-pay | Admitting: Student

## 2017-04-28 DIAGNOSIS — M542 Cervicalgia: Secondary | ICD-10-CM

## 2017-05-03 ENCOUNTER — Ambulatory Visit
Admission: RE | Admit: 2017-05-03 | Discharge: 2017-05-03 | Disposition: A | Discharge status: Home / Self Care | Payer: Medicaid Other | Source: Ambulatory Visit | Attending: Student | Admitting: Student

## 2017-05-03 DIAGNOSIS — M542 Cervicalgia: Secondary | ICD-10-CM

## 2017-05-04 ENCOUNTER — Ambulatory Visit
Admission: RE | Admit: 2017-05-04 | Discharge: 2017-05-04 | Disposition: A | Discharge status: Home / Self Care | Payer: Medicaid Other | Source: Ambulatory Visit | Attending: Student | Admitting: Student

## 2017-05-04 DIAGNOSIS — R2689 Other abnormalities of gait and mobility: Secondary | ICD-10-CM | POA: Diagnosis not present

## 2017-05-04 DIAGNOSIS — R531 Weakness: Secondary | ICD-10-CM | POA: Diagnosis not present

## 2017-05-04 DIAGNOSIS — M542 Cervicalgia: Secondary | ICD-10-CM | POA: Diagnosis present

## 2017-05-04 DIAGNOSIS — M50322 Other cervical disc degeneration at C5-C6 level: Secondary | ICD-10-CM | POA: Diagnosis not present

## 2017-05-04 DIAGNOSIS — R200 Anesthesia of skin: Secondary | ICD-10-CM | POA: Diagnosis not present

## 2017-05-04 DIAGNOSIS — M4802 Spinal stenosis, cervical region: Secondary | ICD-10-CM | POA: Diagnosis not present

## 2017-05-07 ENCOUNTER — Encounter: Payer: Self-pay | Admitting: Anesthesiology

## 2017-05-07 DIAGNOSIS — F1190 Opioid use, unspecified, uncomplicated: Secondary | ICD-10-CM | POA: Insufficient documentation

## 2017-05-07 DIAGNOSIS — M542 Cervicalgia: Secondary | ICD-10-CM | POA: Insufficient documentation

## 2017-05-07 DIAGNOSIS — M5030 Other cervical disc degeneration, unspecified cervical region: Secondary | ICD-10-CM | POA: Insufficient documentation

## 2017-05-13 ENCOUNTER — Other Ambulatory Visit: Payer: Self-pay

## 2017-05-13 ENCOUNTER — Ambulatory Visit
Admission: RE | Admit: 2017-05-13 | Discharge: 2017-05-13 | Disposition: A | Discharge status: Home / Self Care | Payer: Medicaid Other | Source: Ambulatory Visit | Attending: Anesthesiology | Admitting: Anesthesiology

## 2017-05-13 ENCOUNTER — Encounter: Payer: Self-pay | Admitting: Anesthesiology

## 2017-05-13 ENCOUNTER — Other Ambulatory Visit: Payer: Self-pay | Admitting: Anesthesiology

## 2017-05-13 ENCOUNTER — Ambulatory Visit: Payer: Medicaid Other | Admitting: Anesthesiology

## 2017-05-13 VITALS — BP 136/94 | HR 88 | Temp 98.5°F | Resp 14 | Ht 65.5 in | Wt 234.0 lb

## 2017-05-13 DIAGNOSIS — M5441 Lumbago with sciatica, right side: Secondary | ICD-10-CM | POA: Diagnosis not present

## 2017-05-13 DIAGNOSIS — M5442 Lumbago with sciatica, left side: Secondary | ICD-10-CM | POA: Diagnosis not present

## 2017-05-13 DIAGNOSIS — M47816 Spondylosis without myelopathy or radiculopathy, lumbar region: Secondary | ICD-10-CM | POA: Diagnosis not present

## 2017-05-13 DIAGNOSIS — R52 Pain, unspecified: Secondary | ICD-10-CM

## 2017-05-13 DIAGNOSIS — Z79891 Long term (current) use of opiate analgesic: Secondary | ICD-10-CM | POA: Diagnosis not present

## 2017-05-13 DIAGNOSIS — G894 Chronic pain syndrome: Secondary | ICD-10-CM | POA: Insufficient documentation

## 2017-05-13 DIAGNOSIS — M5030 Other cervical disc degeneration, unspecified cervical region: Secondary | ICD-10-CM

## 2017-05-13 DIAGNOSIS — M545 Low back pain: Secondary | ICD-10-CM | POA: Diagnosis present

## 2017-05-13 DIAGNOSIS — M79605 Pain in left leg: Secondary | ICD-10-CM | POA: Diagnosis present

## 2017-05-13 DIAGNOSIS — M542 Cervicalgia: Secondary | ICD-10-CM | POA: Diagnosis not present

## 2017-05-13 DIAGNOSIS — M79604 Pain in right leg: Secondary | ICD-10-CM | POA: Diagnosis present

## 2017-05-13 DIAGNOSIS — G8929 Other chronic pain: Secondary | ICD-10-CM

## 2017-05-13 DIAGNOSIS — F1190 Opioid use, unspecified, uncomplicated: Secondary | ICD-10-CM

## 2017-05-13 MED ORDER — OXYCODONE-ACETAMINOPHEN 7.5-325 MG PO TABS: 1.0000 | ORAL_TABLET | Freq: Three times a day (TID) | ORAL | 0 refills | Status: DC

## 2017-05-13 MED ORDER — SODIUM CHLORIDE 0.9% FLUSH
10.0000 mL | Freq: Once | INTRAVENOUS | Status: AC
  Administered 2017-05-13: 10 mL

## 2017-05-13 MED ORDER — ROPIVACAINE HCL 2 MG/ML IJ SOLN
10.0000 mL | Freq: Once | INTRAMUSCULAR | Status: AC
  Administered 2017-05-13: 10 mL via EPIDURAL
  Filled 2017-05-13: qty 10

## 2017-05-13 MED ORDER — IOPAMIDOL (ISOVUE-M 200) INJECTION 41%
INTRAMUSCULAR | Status: AC
  Filled 2017-05-13: qty 10

## 2017-05-13 MED ORDER — LIDOCAINE HCL (PF) 1 % IJ SOLN
5.0000 mL | Freq: Once | INTRAMUSCULAR | Status: AC
  Administered 2017-05-13: 5 mL via SUBCUTANEOUS

## 2017-05-13 MED ORDER — TRIAMCINOLONE ACETONIDE 40 MG/ML IJ SUSP
40.0000 mg | Freq: Once | INTRAMUSCULAR | Status: AC
  Administered 2017-05-13: 40 mg
  Filled 2017-05-13: qty 1

## 2017-05-13 MED ORDER — IOPAMIDOL (ISOVUE-M 200) INJECTION 41%
20.0000 mL | Freq: Once | INTRAMUSCULAR | Status: DC | PRN
  Administered 2017-05-13: 20 mL
  Filled 2017-05-13: qty 20

## 2017-05-13 NOTE — Patient Instructions (Signed)

## 2017-05-13 NOTE — Progress Notes (Signed)
Subjective:  Patient ID: Hannah Maynard, female    DOB: 11/16/1965  Age: 52 y.o. MRN: [id number]  CC: Back Pain (lower) and Leg Pain (bilaterally, including toes)   Procedure: L5-S1 epidural steroid without sedation  HPI Hannah Maynard presents for reevaluation.  She was last seen in February and has had previous epidurals back in October and November 2018.  She responded to these favorably for her low back pain and leg pain.  She gets severe unremitting pain radiating into the calves with both legs which she has failed conservative therapy with physical therapy and medication management alone as well as stretching strengthening exercises.  Her last epidural gave her approximately 75% improvement lasting approximately 6-8 weeks.  She presents today for reevaluation and consideration for repeat epidural.  She is taking her medications at the 5 mg strength hydrocodone 4 times a day and this is not giving her sufficient relief she reports.  Otherwise the methocarbamol gives her some mild improvement in the muscle spasms and the TENS unit application to the low back helps but nothing else gives her significant improvement overall.  She desires to proceed with a repeat epidural today.  No change in lower extremity strength or function or bowel bladder function is noted and otherwise she reports that she is in her usual state of health.  Outpatient Medications Prior to Visit  Medication Sig Dispense Refill  . ibuprofen (ADVIL,MOTRIN) 200 MG tablet Take 200 mg by mouth every 6 (six) hours as needed.    . meloxicam (MOBIC) 7.5 MG tablet Take 7.5 mg by mouth daily.    . methocarbamol (ROBAXIN) 750 MG tablet Take 1 tablet (750 mg total) by mouth 4 (four) times daily. 90 tablet 3  . Nerve Stimulator (ZEWA TENS/EMS COMBO UNIT) DEVI by Does not apply route.    Marland Kitchen oxyCODONE (OXY IR/ROXICODONE) 5 MG immediate release tablet Take 1 tablet (5 mg total) by mouth every 6 (six) hours as needed for severe pain. 120  tablet 0  . PARoxetine (PAXIL) 40 MG tablet Take 0.5 tablets (20 mg total) by mouth at bedtime. 30 tablet 6  . pregabalin (LYRICA) 75 MG capsule Take 2 capsules (150 mg total) by mouth 2 (two) times daily. 120 capsule 5   Facility-Administered Medications Prior to Visit  Medication Dose Route Frequency Provider Last Rate Last Dose  . iopamidol (ISOVUE-M) 41 % intrathecal injection 20 mL  20 mL Other Once PRN Molli Barrows, MD      . lactated ringers infusion 1,000 mL  1,000 mL Intravenous Continuous Molli Barrows, MD      . lidocaine (PF) (XYLOCAINE) 1 % injection 5 mL  5 mL Subcutaneous Once Molli Barrows, MD      . midazolam (VERSED) injection 5 mg  5 mg Intravenous Once Molli Barrows, MD      . orphenadrine (NORFLEX) injection 60 mg  60 mg Intramuscular Once Molli Barrows, MD      . ropivacaine (PF) 2 mg/mL (0.2%) (NAROPIN) injection 10 mL  10 mL Epidural Once Molli Barrows, MD      . sodium chloride flush (NS) 0.9 % injection 10 mL  10 mL Other Once Molli Barrows, MD      . triamcinolone acetonide (KENALOG-40) injection 40 mg  40 mg Other Once Molli Barrows, MD        Review of Systems CNS: No confusion or sedation Cardiac: No angina or palpitations GI: No abdominal pain  or constipation Constitutional: No nausea vomiting fevers or chills  Objective:  BP (!) 136/94   Pulse 88   Temp 98.5 F (36.9 C) (Oral)   Resp 14   Ht 5' 5.5" (1.664 m)   Wt 234 lb (106.1 kg)   LMP  (LMP Unknown)   SpO2 99%   BMI 38.35 kg/m    BP Readings from Last 3 Encounters:  05/13/17 (!) 136/94  04/16/17 136/84  03/27/17 (!) 147/93     Wt Readings from Last 3 Encounters:  05/13/17 234 lb (106.1 kg)  03/27/17 234 lb (106.1 kg)  02/21/17 233 lb 8 oz (105.9 kg)     Physical Exam Pt is alert and oriented PERRL EOMI HEART IS RRR no murmur or rub LCTA no wheezing or rales MUSCULOSKELETAL reveals some paraspinous muscle tenderness in the low back but no overt trigger points.  Her  muscle tone and bulk is at baseline.  She has a positive straight leg raise on the left side negative on the right.  She ambulates with a mildly antalgic gait and uses a cane for support.  Labs  No results found for: HGBA1C Lab Results  Component Value Date   CREATININE 0.83 01/13/2017    -------------------------------------------------------------------------------------------------------------------- Lab Results  Component Value Date   GLUCOSE 74 01/13/2017   AST 39 01/13/2017   NA 143 01/13/2017   K 4.3 01/13/2017   CL 105 01/13/2017   CREATININE 0.83 01/13/2017   BUN 15 01/13/2017    --------------------------------------------------------------------------------------------------------------------- Dg C-arm 1-60 Min-no Report  Result Date: 05/13/2017 Fluoroscopy was utilized by the requesting physician.  No radiographic interpretation.     Assessment & Plan:   Hannah Maynard was seen today for back pain and leg pain.  Diagnoses and all orders for this visit:  Lumbar spondylosis -     triamcinolone acetonide (KENALOG-40) injection 40 mg -     sodium chloride flush (NS) 0.9 % injection 10 mL -     ropivacaine (PF) 2 mg/mL (0.2%) (NAROPIN) injection 10 mL -     lidocaine (PF) (XYLOCAINE) 1 % injection 5 mL -     iopamidol (ISOVUE-M) 41 % intrathecal injection 20 mL  Chronic bilateral low back pain with bilateral sciatica -     Lumbar Epidural Injection -     triamcinolone acetonide (KENALOG-40) injection 40 mg -     sodium chloride flush (NS) 0.9 % injection 10 mL -     ropivacaine (PF) 2 mg/mL (0.2%) (NAROPIN) injection 10 mL -     lidocaine (PF) (XYLOCAINE) 1 % injection 5 mL -     iopamidol (ISOVUE-M) 41 % intrathecal injection 20 mL  Chronic pain syndrome  Chronic, continuous use of opioids  DDD (degenerative disc disease), cervical  Chronic neck pain  Other orders -     Discontinue: oxyCODONE-acetaminophen (PERCOCET) 7.5-325 MG tablet; Take 1 tablet by  mouth 3 (three) times daily. -     Discontinue: oxyCODONE-acetaminophen (PERCOCET) 7.5-325 MG tablet; Take 1 tablet by mouth 3 (three) times daily. -     oxyCODONE-acetaminophen (PERCOCET) 7.5-325 MG tablet; Take 1 tablet by mouth 3 (three) times daily.        ----------------------------------------------------------------------------------------------------------------------  Problem List Items Addressed This Visit      Unprioritized   Chronic low back pain (Chronic)   Relevant Medications   triamcinolone acetonide (KENALOG-40) injection 40 mg (Completed)   sodium chloride flush (NS) 0.9 % injection 10 mL (Completed)   ropivacaine (PF) 2 mg/mL (0.2%) (NAROPIN) injection  10 mL (Completed)   lidocaine (PF) (XYLOCAINE) 1 % injection 5 mL (Completed)   iopamidol (ISOVUE-M) 41 % intrathecal injection 20 mL   meloxicam (MOBIC) 7.5 MG tablet   oxyCODONE-acetaminophen (PERCOCET) 7.5-325 MG tablet   Chronic neck pain   Relevant Medications   triamcinolone acetonide (KENALOG-40) injection 40 mg (Completed)   ropivacaine (PF) 2 mg/mL (0.2%) (NAROPIN) injection 10 mL (Completed)   lidocaine (PF) (XYLOCAINE) 1 % injection 5 mL (Completed)   meloxicam (MOBIC) 7.5 MG tablet   oxyCODONE-acetaminophen (PERCOCET) 7.5-325 MG tablet   Chronic pain syndrome   Chronic, continuous use of opioids   DDD (degenerative disc disease), cervical   Relevant Medications   triamcinolone acetonide (KENALOG-40) injection 40 mg (Completed)   meloxicam (MOBIC) 7.5 MG tablet   oxyCODONE-acetaminophen (PERCOCET) 7.5-325 MG tablet   Lumbar spondylosis - Primary   Relevant Medications   triamcinolone acetonide (KENALOG-40) injection 40 mg (Completed)   sodium chloride flush (NS) 0.9 % injection 10 mL (Completed)   ropivacaine (PF) 2 mg/mL (0.2%) (NAROPIN) injection 10 mL (Completed)   lidocaine (PF) (XYLOCAINE) 1 % injection 5 mL (Completed)   iopamidol (ISOVUE-M) 41 % intrathecal injection 20 mL   meloxicam  (MOBIC) 7.5 MG tablet   oxyCODONE-acetaminophen (PERCOCET) 7.5-325 MG tablet        ----------------------------------------------------------------------------------------------------------------------  1. Chronic bilateral low back pain with bilateral sciatica Unfortunately she continues to have severe low back pain that is been incapacitating.  The epidurals have helped.  She desires to proceed with a repeat epidural injection today I think this is reasonable.  We will plan on doing this at the L5-S1 level however she did eat today and desires to proceed with no sedation secondary to the clinic policy.  We have gone over the risks and benefits of the procedure with her in full detail and answered all her questions today.  I want her to return to clinic in approximately 1-2 months for reevaluation possible repeat injection at that time.  I also talked to her about the significance of and importance of weight loss and stretching exercises again today. - Lumbar Epidural Injection - triamcinolone acetonide (KENALOG-40) injection 40 mg - sodium chloride flush (NS) 0.9 % injection 10 mL - ropivacaine (PF) 2 mg/mL (0.2%) (NAROPIN) injection 10 mL - lidocaine (PF) (XYLOCAINE) 1 % injection 5 mL - iopamidol (ISOVUE-M) 41 % intrathecal injection 20 mL  2. Lumbar spondylosis As above - triamcinolone acetonide (KENALOG-40) injection 40 mg - sodium chloride flush (NS) 0.9 % injection 10 mL - ropivacaine (PF) 2 mg/mL (0.2%) (NAROPIN) injection 10 mL - lidocaine (PF) (XYLOCAINE) 1 % injection 5 mL - iopamidol (ISOVUE-M) 41 % intrathecal injection 20 mL  3. Chronic pain syndrome As above.  4. Chronic, continuous use of opioids She is asked to increase her dosing on the opioid medications as she cannot get comfortable.  She has no side effects with her existing hydrocodone dose.  I will switch her over to Percocet 7.5 mg tablets 3 times a day.  She has been compliant with her regimen and we have  reviewed the [facility] practitioner database information and it is appropriate.  She is to return to clinic in approximately 2 months for reevaluation.  She is instructed to contact us if she has any problems with her medications.  5. DDD (degenerative disc disease), cervical As above  6. Chronic neck pain     ----------------------------------------------------------------------------------------------------------------------  I am having Hannah Maynard maintain her ibuprofen, ZEWA TENS/EMS COMBO  UNIT, PARoxetine, pregabalin, methocarbamol, oxyCODONE, meloxicam, and oxyCODONE-acetaminophen. We administered triamcinolone acetonide, sodium chloride flush, ropivacaine (PF) 2 mg/mL (0.2%), lidocaine (PF), and iopamidol. We will continue to administer triamcinolone acetonide, sodium chloride flush, ropivacaine (PF) 2 mg/mL (0.2%), midazolam, lidocaine (PF), lactated ringers, and iopamidol.   Meds ordered this encounter  Medications  . DISCONTD: oxyCODONE-acetaminophen (PERCOCET) 7.5-325 MG tablet    Sig: Take 1 tablet by mouth 3 (three) times daily.    Dispense:  90 tablet    Refill:  0    Do not fill until [id number]  . DISCONTD: oxyCODONE-acetaminophen (PERCOCET) 7.5-325 MG tablet    Sig: Take 1 tablet by mouth 3 (three) times daily.    Dispense:  90 tablet    Refill:  0    Do not fill until [id number]  . triamcinolone acetonide (KENALOG-40) injection 40 mg  . sodium chloride flush (NS) 0.9 % injection 10 mL  . ropivacaine (PF) 2 mg/mL (0.2%) (NAROPIN) injection 10 mL  . lidocaine (PF) (XYLOCAINE) 1 % injection 5 mL  . iopamidol (ISOVUE-M) 41 % intrathecal injection 20 mL  . oxyCODONE-acetaminophen (PERCOCET) 7.5-325 MG tablet    Sig: Take 1 tablet by mouth 3 (three) times daily.    Dispense:  90 tablet    Refill:  0    Do not fill until 05/24/2017   Patient's Medications  New Prescriptions   OXYCODONE-ACETAMINOPHEN (PERCOCET) 7.5-325 MG TABLET    Take 1 tablet by mouth 3  (three) times daily.  Previous Medications   IBUPROFEN (ADVIL,MOTRIN) 200 MG TABLET    Take 200 mg by mouth every 6 (six) hours as needed.   MELOXICAM (MOBIC) 7.5 MG TABLET    Take 7.5 mg by mouth daily.   METHOCARBAMOL (ROBAXIN) 750 MG TABLET    Take 1 tablet (750 mg total) by mouth 4 (four) times daily.   NERVE STIMULATOR (ZEWA TENS/EMS COMBO UNIT) DEVI    by Does not apply route.   OXYCODONE (OXY IR/ROXICODONE) 5 MG IMMEDIATE RELEASE TABLET    Take 1 tablet (5 mg total) by mouth every 6 (six) hours as needed for severe pain.   PAROXETINE (PAXIL) 40 MG TABLET    Take 0.5 tablets (20 mg total) by mouth at bedtime.   PREGABALIN (LYRICA) 75 MG CAPSULE    Take 2 capsules (150 mg total) by mouth 2 (two) times daily.  Modified Medications   No medications on file  Discontinued Medications   No medications on file   ----------------------------------------------------------------------------------------------------------------------  Follow-up: Return in about 2 months (around 07/13/2017) for evaluation.  Procedure: L5-S1 epidural steroid injection without sedation   Procedure: L5-S1 LESI with fluoroscopic guidance and moderate sedation  NOTE: The risks, benefits, and expectations of the procedure have been discussed and explained to the patient who was understanding and in agreement with suggested treatment plan. No guarantees were made.  DESCRIPTION OF PROCEDURE: Lumbar epidural steroid injection with no IV Versed, EKG, blood pressure, pulse, and pulse oximetry monitoring. The procedure was performed with the patient in the prone position under fluoroscopic guidance.  Sterile prep x3 was initiated and I then injected subcutaneous lidocaine to the overlying L5-S1 site after its fluoroscopic identifictation.  Using strict aseptic technique, I then advanced an 18-gauge Tuohy epidural needle in the midline using interlaminar approach via loss-of-resistance to saline technique. There was negative  aspiration for heme or  CSF.  I then confirmed position with both AP and Lateral fluoroscan.  2 cc of Isovue were injected and a  total of 5 mL of Preservative-Free normal saline mixed with 40 mg of Kenalog and 1cc Ropicaine 0.2 percent were injected incrementally via the  epidurally placed needle. The needle was removed. The patient tolerated the injection well and was convalesced and discharged to home in stable condition. Should the patient have any post procedure difficulty they have been instructed on how to contact us for assistance.    Molli Barrows, MD

## 2017-05-13 NOTE — Progress Notes (Signed)
Safety precautions to be maintained throughout the outpatient stay will include: orient to surroundings, keep bed in low position, maintain call bell within reach at all times, provide assistance with transfer out of bed and ambulation.  

## 2017-05-14 ENCOUNTER — Telehealth: Payer: Self-pay | Admitting: *Deleted

## 2017-05-14 NOTE — Telephone Encounter (Signed)
Attempted to call for post procedure follow-up.Unable to contact patient with phone number given.

## 2017-05-26 NOTE — Progress Notes (Signed)
Nursing Pain Medication Assessment:  Safety precautions to be maintained throughout the outpatient stay will include: orient to surroundings, keep bed in low position, maintain call bell within reach at all times, provide assistance with transfer out of bed and ambulation.  Medication Inspection Compliance: Pill count conducted under aseptic conditions, in front of the patient. Neither the pills nor the bottle was removed from the patient's sight at any time. Once count was completed pills were immediately returned to the patient in their original bottle.  Medication: Oxycodone IR Pill/Patch Count: 0 of 120 pills remain Pill/Patch Appearance: Markings consistent with prescribed medication Bottle Appearance: Standard pharmacy container. Clearly labeled. Filled Date: 02 / 27 / 2019 Last Medication intake:  Ran out of medicine more than 48 hours ago

## 2017-05-27 ENCOUNTER — Telehealth: Payer: Self-pay | Admitting: *Deleted

## 2017-05-27 NOTE — Telephone Encounter (Signed)
Received PA today and sent to [facility]. Patient called and informed.

## 2017-06-19 ENCOUNTER — Encounter: Payer: Self-pay | Admitting: Anesthesiology

## 2017-06-19 ENCOUNTER — Ambulatory Visit: Payer: Medicaid Other | Attending: Anesthesiology | Admitting: Anesthesiology

## 2017-06-19 ENCOUNTER — Other Ambulatory Visit: Payer: Self-pay

## 2017-06-19 VITALS — BP 130/92 | Temp 98.2°F | Resp 16 | Ht 65.0 in | Wt 234.0 lb

## 2017-06-19 DIAGNOSIS — G894 Chronic pain syndrome: Secondary | ICD-10-CM | POA: Diagnosis not present

## 2017-06-19 DIAGNOSIS — Z79891 Long term (current) use of opiate analgesic: Secondary | ICD-10-CM | POA: Insufficient documentation

## 2017-06-19 DIAGNOSIS — M545 Low back pain: Secondary | ICD-10-CM | POA: Diagnosis present

## 2017-06-19 DIAGNOSIS — Z79899 Other long term (current) drug therapy: Secondary | ICD-10-CM | POA: Diagnosis not present

## 2017-06-19 DIAGNOSIS — M47816 Spondylosis without myelopathy or radiculopathy, lumbar region: Secondary | ICD-10-CM | POA: Diagnosis not present

## 2017-06-19 DIAGNOSIS — M5030 Other cervical disc degeneration, unspecified cervical region: Secondary | ICD-10-CM | POA: Insufficient documentation

## 2017-06-19 DIAGNOSIS — G8929 Other chronic pain: Secondary | ICD-10-CM

## 2017-06-19 DIAGNOSIS — M5442 Lumbago with sciatica, left side: Secondary | ICD-10-CM | POA: Diagnosis not present

## 2017-06-19 DIAGNOSIS — Z791 Long term (current) use of non-steroidal anti-inflammatories (NSAID): Secondary | ICD-10-CM | POA: Diagnosis not present

## 2017-06-19 DIAGNOSIS — M5441 Lumbago with sciatica, right side: Secondary | ICD-10-CM | POA: Diagnosis not present

## 2017-06-19 DIAGNOSIS — F1190 Opioid use, unspecified, uncomplicated: Secondary | ICD-10-CM | POA: Diagnosis not present

## 2017-06-19 MED ORDER — OXYCODONE HCL 5 MG PO TABS: 5.0000 mg | ORAL_TABLET | Freq: Four times a day (QID) | ORAL | 0 refills | Status: DC | PRN

## 2017-06-19 MED ORDER — PREGABALIN 75 MG PO CAPS: 150.0000 mg | ORAL_CAPSULE | Freq: Two times a day (BID) | ORAL | 3 refills | Status: DC

## 2017-06-19 NOTE — Progress Notes (Signed)
Nursing Pain Medication Assessment:  Safety precautions to be maintained throughout the outpatient stay will include: orient to surroundings, keep bed in low position, maintain call bell within reach at all times, provide assistance with transfer out of bed and ambulation.  Medication Inspection Compliance: Pill count conducted under aseptic conditions, in front of the patient. Neither the pills nor the bottle was removed from the patient's sight at any time. Once count was completed pills were immediately returned to the patient in their original bottle.  Medication: Oxycodone/APAP Pill/Patch Count: 31 of 90 pills remain Pill/Patch Appearance: Markings consistent with prescribed medication Bottle Appearance: Standard pharmacy container. Clearly labeled. Filled Date: 04/01 / 2019 Last Medication intake:  Today

## 2017-06-19 NOTE — Patient Instructions (Signed)
Epidural Steroid Injection Patient Information  Description: The epidural space surrounds the nerves as they exit the spinal cord.  In some patients, the nerves can be compressed and inflamed by a bulging disc or a tight spinal canal (spinal stenosis).  By injecting steroids into the epidural space, we can bring irritated nerves into direct contact with a potentially helpful medication.  These steroids act directly on the irritated nerves and can reduce swelling and inflammation which often leads to decreased pain.  Epidural steroids may be injected anywhere along the spine and from the neck to the low back depending upon the location of your pain.   After numbing the skin with local anesthetic (like Novocaine), a small needle is passed into the epidural space slowly.  You may experience a sensation of pressure while this is being done.  The entire block usually last less than 10 minutes.  Conditions which may be treated by epidural steroids:   Low back and leg pain  Neck and arm pain  Spinal stenosis  Post-laminectomy syndrome  Herpes zoster (shingles) pain  Pain from compression fractures  Preparation for the injection:  1. Do not eat any solid food or dairy products within 8 hours of your appointment.  2. You may drink clear liquids up to 3 hours before appointment.  Clear liquids include water, black coffee, juice or soda.  No milk or cream please. 3. You may take your regular medication, including pain medications, with a sip of water before your appointment  Diabetics should hold regular insulin (if taken separately) and take 1/2 normal NPH dos the morning of the procedure.  Carry some sugar containing items with you to your appointment. 4. A driver must accompany you and be prepared to drive you home after your procedure.  5. Bring all your current medications with your. 6. An IV may be inserted and sedation may be given at the discretion of the physician.   7. A blood pressure  cuff, EKG and other monitors will often be applied during the procedure.  Some patients may need to have extra oxygen administered for a short period. 8. You will be asked to provide medical information, including your allergies, prior to the procedure.  We must know immediately if you are taking blood thinners (like Coumadin/Warfarin)  Or if you are allergic to IV iodine contrast (dye). We must know if you could possible be pregnant.  Possible side-effects:  Bleeding from needle site  Infection (rare, may require surgery)  Nerve injury (rare)  Numbness & tingling (temporary)  Difficulty urinating (rare, temporary)  Spinal headache ( a headache worse with upright posture)  Light -headedness (temporary)  Pain at injection site (several days)  Decreased blood pressure (temporary)  Weakness in arm/leg (temporary)  Pressure sensation in back/neck (temporary)  Call if you experience:  Fever/chills associated with headache or increased back/neck pain.  Headache worsened by an upright position.  New onset weakness or numbness of an extremity below the injection site  Hives or difficulty breathing (go to the emergency room)  Inflammation or drainage at the infection site  Severe back/neck pain  Any new symptoms which are concerning to you  Please note:  Although the local anesthetic injected can often make your back or neck feel good for several hours after the injection, the pain will likely return.  It takes 3-7 days for steroids to work in the epidural space.  You may not notice any pain relief for at least that one week.    If effective, we will often do a series of three injections spaced 3-6 weeks apart to maximally decrease your pain.  After the initial series, we generally will wait several months before considering a repeat injection of the same type.  If you have any questions, please call [phone] Arma were given 2  prescriptions for Oxycodone today. You were given 1 prescription for Lyrica.

## 2017-06-20 NOTE — Progress Notes (Signed)
Subjective:  Patient ID: Hannah Maynard, female    DOB: [birth year]  Age: 52 y.o. MRN: [id number]  CC: Back Pain (lower)   Procedure: None  HPI [employer] presents for valuation.  She was last seen 2 months ago and has been doing well with her current regimen.  She has recently switched to Percocet 3 times a day from a previous dose of Percocet 5 mg 4 times a day.  She feels that she needs an extra tablet to cover her at night.  She frequently has pain during early morning hours that wakes her up.  She is requesting an additional dose but feels that the 7.5 mg tablet is more effective for her.  Otherwise she is in her usual state of health with no new changes in the quality characteristic or distribution of her pain.  We have reviewed the narcotic assessment sheet and she shows that she continues to derive good functional lifestyle improvement with her medication management.  She did have a recent epidural steroid injection that gave her approximately 50% reduction in low back pain which is lasted approximately 6 weeks.  As well as some additional improvement in her leg pain.  She still has occasional spasming in the lower extremity and calves that she takes the methocarbamol and this does seem to help.  Outpatient Medications Prior to Visit  Medication Sig Dispense Refill  . ibuprofen (ADVIL,MOTRIN) 200 MG tablet Take 200 mg by mouth every 6 (six) hours as needed.    . methocarbamol (ROBAXIN) 750 MG tablet Take 1 tablet (750 mg total) by mouth 4 (four) times daily. 90 tablet 3  . Nerve Stimulator (ZEWA TENS/EMS COMBO UNIT) DEVI by Does not apply route.    Marland Kitchen oxyCODONE-acetaminophen (PERCOCET) 7.5-325 MG tablet Take 1 tablet by mouth 3 (three) times daily. 90 tablet 0  . pregabalin (LYRICA) 75 MG capsule Take 2 capsules (150 mg total) by mouth 2 (two) times daily. 120 capsule 5  . meloxicam (MOBIC) 7.5 MG tablet Take 7.5 mg by mouth daily.    Marland Kitchen oxyCODONE (OXY IR/ROXICODONE) 5 MG  immediate release tablet Take 1 tablet (5 mg total) by mouth every 6 (six) hours as needed for severe pain. (Patient not taking: Reported on 06/19/2017) 120 tablet 0  . PARoxetine (PAXIL) 40 MG tablet Take 0.5 tablets (20 mg total) by mouth at bedtime. (Patient not taking: Reported on 06/19/2017) 30 tablet 6   Facility-Administered Medications Prior to Visit  Medication Dose Route Frequency Provider Last Rate Last Dose  . lidocaine (PF) (XYLOCAINE) 1 % injection 5 mL  5 mL Subcutaneous Once Molli Barrows, MD      . ropivacaine (PF) 2 mg/mL (0.2%) (NAROPIN) injection 10 mL  10 mL Epidural Once Molli Barrows, MD      . iopamidol (ISOVUE-M) 41 % intrathecal injection 20 mL  20 mL Other Once PRN Molli Barrows, MD      . lactated ringers infusion 1,000 mL  1,000 mL Intravenous Continuous Molli Barrows, MD      . midazolam (VERSED) injection 5 mg  5 mg Intravenous Once Molli Barrows, MD      . sodium chloride flush (NS) 0.9 % injection 10 mL  10 mL Other Once Molli Barrows, MD      . triamcinolone acetonide (KENALOG-40) injection 40 mg  40 mg Other Once Molli Barrows, MD        Review of Systems CNS: No confusion or  sedation Cardiac: No angina or palpitations GI: No abdominal pain or constipation Constitutional: No nausea vomiting fevers or chills  Objective:  BP (!) 130/92   Temp 98.2 F (36.8 C) (Oral)   Resp 16   Ht 5\' 5"  (1.651 m)   Wt 234 lb (106.1 kg)   LMP  (LMP Unknown)   BMI 38.94 kg/m    BP Readings from Last 3 Encounters:  06/19/17 (!) 130/92  05/13/17 (!) 136/94  04/16/17 136/84     Wt Readings from Last 3 Encounters:  06/19/17 234 lb (106.1 kg)  05/13/17 234 lb (106.1 kg)  03/27/17 234 lb (106.1 kg)     Physical Exam Pt is alert and oriented PERRL EOMI HEART IS RRR no murmur or rub LCTA no wheezing or rales MUSCULOSKELETAL reveals an antalgic gait.  She has made no significant improvement on weight loss.  Her lower extremity muscle tone and bulk is at  baseline.  Labs  No results found for: HGBA1C Lab Results  Component Value Date   CREATININE 0.83 01/13/2017    -------------------------------------------------------------------------------------------------------------------- Lab Results  Component Value Date   GLUCOSE 74 01/13/2017   AST 39 01/13/2017   NA 143 01/13/2017   K 4.3 01/13/2017   CL 105 01/13/2017   CREATININE 0.83 01/13/2017   BUN 15 01/13/2017    --------------------------------------------------------------------------------------------------------------------- Dg C-arm 1-60 Min-no Report  Result Date: 05/13/2017 Fluoroscopy was utilized by the requesting physician.  No radiographic interpretation.     Assessment & Plan:   [name] was seen today for back pain.  Diagnoses and all orders for this visit:  Chronic bilateral low back pain with bilateral sciatica  Lumbar spondylosis  Chronic pain syndrome -     Discontinue: oxyCODONE (OXY IR/ROXICODONE) 5 MG immediate release tablet; Take 1 tablet (5 mg total) by mouth every 6 (six) hours as needed for severe pain. -     oxyCODONE (OXY IR/ROXICODONE) 5 MG immediate release tablet; Take 1 tablet (5 mg total) by mouth every 6 (six) hours as needed for severe pain.  Chronic, continuous use of opioids  DDD (degenerative disc disease), cervical  Facet arthritis of lumbar region  Other orders -     pregabalin (LYRICA) 75 MG capsule; Take 2 capsules (150 mg total) by mouth 2 (two) times daily.        ----------------------------------------------------------------------------------------------------------------------  Problem List Items Addressed This Visit      Unprioritized   Chronic low back pain - Primary (Chronic)   Relevant Medications   oxyCODONE (OXY IR/ROXICODONE) 5 MG immediate release tablet   Chronic pain syndrome   Relevant Medications   oxyCODONE (OXY IR/ROXICODONE) 5 MG immediate release tablet   Chronic, continuous use of  opioids   DDD (degenerative disc disease), cervical   Relevant Medications   oxyCODONE (OXY IR/ROXICODONE) 5 MG immediate release tablet   Lumbar spondylosis   Relevant Medications   oxyCODONE (OXY IR/ROXICODONE) 5 MG immediate release tablet    Other Visit Diagnoses    Facet arthritis of lumbar region       Relevant Medications   oxyCODONE (OXY IR/ROXICODONE) 5 MG immediate release tablet        ----------------------------------------------------------------------------------------------------------------------  1. Chronic bilateral low back pain with bilateral sciatica She feels that she did make good improvement with her last epidural steroid injection.  She desires to proceed with a repeat epidural at her next visit.  We will plan for this in 2 months.  I have encouraged her once again to continue with  core stretching strengthening exercises and efforts at weight loss to help with her sciatica symptoms.  At this point I will allow her to switch back to the 5 mg strength tablets 4 times a day but do not feel that 4 times a day at the 7.5 strength is appropriate.  We have had this discussion today.  2. Lumbar spondylosis As above  3. Chronic pain syndrome We have reviewed the [facility] practitioner database information and it is appropriate. - oxyCODONE (OXY IR/ROXICODONE) 5 MG immediate release tablet; Take 1 tablet (5 mg total) by mouth every 6 (six) hours as needed for severe pain.  Dispense: 120 tablet; Refill: 0  4. Chronic, continuous use of opioids Prescriptions were given today for May 5 and June for for the change back to 4 times a day dosing of 5 mg strength  5. DDD (degenerative disc disease), cervical As above  6. Facet arthritis of lumbar region As above    ----------------------------------------------------------------------------------------------------------------------  I have discontinued Hannah Maynard's PARoxetine. I am also having her  maintain her ibuprofen, ZEWA TENS/EMS COMBO UNIT, methocarbamol, meloxicam, oxyCODONE-acetaminophen, oxyCODONE, and pregabalin. We will stop administering triamcinolone acetonide, sodium chloride flush, midazolam, lactated ringers, and iopamidol. Additionally, we will continue to administer ropivacaine (PF) 2 mg/mL (0.2%) and lidocaine (PF).   Meds ordered this encounter  Medications  . DISCONTD: oxyCODONE (OXY IR/ROXICODONE) 5 MG immediate release tablet    Sig: Take 1 tablet (5 mg total) by mouth every 6 (six) hours as needed for severe pain.    Dispense:  120 tablet    Refill:  0    Do not fill until [id number]  . oxyCODONE (OXY IR/ROXICODONE) 5 MG immediate release tablet    Sig: Take 1 tablet (5 mg total) by mouth every 6 (six) hours as needed for severe pain.    Dispense:  120 tablet    Refill:  0    Do not fill until [id number]  . pregabalin (LYRICA) 75 MG capsule    Sig: Take 2 capsules (150 mg total) by mouth 2 (two) times daily.    Dispense:  120 capsule    Refill:  3   Patient's Medications  New Prescriptions   No medications on file  Previous Medications   IBUPROFEN (ADVIL,MOTRIN) 200 MG TABLET    Take 200 mg by mouth every 6 (six) hours as needed.   MELOXICAM (MOBIC) 7.5 MG TABLET    Take 7.5 mg by mouth daily.   METHOCARBAMOL (ROBAXIN) 750 MG TABLET    Take 1 tablet (750 mg total) by mouth 4 (four) times daily.   NERVE STIMULATOR (ZEWA TENS/EMS COMBO UNIT) DEVI    by Does not apply route.   OXYCODONE-ACETAMINOPHEN (PERCOCET) 7.5-325 MG TABLET    Take 1 tablet by mouth 3 (three) times daily.  Modified Medications   Modified Medication Previous Medication   OXYCODONE (OXY IR/ROXICODONE) 5 MG IMMEDIATE RELEASE TABLET oxyCODONE (OXY IR/ROXICODONE) 5 MG immediate release tablet      Take 1 tablet (5 mg total) by mouth every 6 (six) hours as needed for severe pain.    Take 1 tablet (5 mg total) by mouth every 6 (six) hours as needed for severe pain.   PREGABALIN (LYRICA) 75 MG  CAPSULE pregabalin (LYRICA) 75 MG capsule      Take 2 capsules (150 mg total) by mouth 2 (two) times daily.    Take 2 capsules (150 mg total) by mouth 2 (two) times daily.  Discontinued Medications  PAROXETINE (PAXIL) 40 MG TABLET    Take 0.5 tablets (20 mg total) by mouth at bedtime.   ----------------------------------------------------------------------------------------------------------------------  Follow-up: Return in about 2 months (around 08/19/2017) for procedure.    Molli Barrows, MD

## 2017-07-09 ENCOUNTER — Ambulatory Visit: Payer: Medicaid Other | Admitting: Anesthesiology

## 2017-07-31 ENCOUNTER — Other Ambulatory Visit: Payer: Self-pay | Admitting: Anesthesiology

## 2017-08-05 ENCOUNTER — Ambulatory Visit: Payer: Medicaid Other | Admitting: Anesthesiology

## 2017-08-05 ENCOUNTER — Other Ambulatory Visit: Payer: Self-pay

## 2017-08-05 ENCOUNTER — Other Ambulatory Visit: Payer: Self-pay | Admitting: Anesthesiology

## 2017-08-05 ENCOUNTER — Ambulatory Visit
Admission: RE | Admit: 2017-08-05 | Discharge: 2017-08-05 | Disposition: A | Discharge status: Home / Self Care | Payer: Medicaid Other | Source: Ambulatory Visit | Attending: Anesthesiology | Admitting: Anesthesiology

## 2017-08-05 ENCOUNTER — Encounter: Payer: Self-pay | Admitting: Anesthesiology

## 2017-08-05 VITALS — BP 119/68 | HR 68 | Temp 97.7°F | Resp 18 | Ht 65.0 in | Wt 230.0 lb

## 2017-08-05 DIAGNOSIS — M7918 Myalgia, other site: Secondary | ICD-10-CM | POA: Insufficient documentation

## 2017-08-05 DIAGNOSIS — G894 Chronic pain syndrome: Secondary | ICD-10-CM | POA: Insufficient documentation

## 2017-08-05 DIAGNOSIS — Z79899 Other long term (current) drug therapy: Secondary | ICD-10-CM | POA: Insufficient documentation

## 2017-08-05 DIAGNOSIS — M545 Low back pain: Secondary | ICD-10-CM | POA: Diagnosis present

## 2017-08-05 DIAGNOSIS — G8929 Other chronic pain: Secondary | ICD-10-CM

## 2017-08-05 DIAGNOSIS — M47816 Spondylosis without myelopathy or radiculopathy, lumbar region: Secondary | ICD-10-CM | POA: Diagnosis not present

## 2017-08-05 DIAGNOSIS — M5441 Lumbago with sciatica, right side: Secondary | ICD-10-CM | POA: Insufficient documentation

## 2017-08-05 DIAGNOSIS — M5442 Lumbago with sciatica, left side: Secondary | ICD-10-CM

## 2017-08-05 DIAGNOSIS — Z79891 Long term (current) use of opiate analgesic: Secondary | ICD-10-CM | POA: Insufficient documentation

## 2017-08-05 DIAGNOSIS — M5030 Other cervical disc degeneration, unspecified cervical region: Secondary | ICD-10-CM | POA: Diagnosis not present

## 2017-08-05 DIAGNOSIS — R52 Pain, unspecified: Secondary | ICD-10-CM

## 2017-08-05 DIAGNOSIS — F1190 Opioid use, unspecified, uncomplicated: Secondary | ICD-10-CM

## 2017-08-05 DIAGNOSIS — M542 Cervicalgia: Secondary | ICD-10-CM

## 2017-08-05 MED ORDER — IOPAMIDOL (ISOVUE-M 200) INJECTION 41%
20.0000 mL | Freq: Once | INTRAMUSCULAR | Status: DC | PRN
  Administered 2017-08-05: 3 mL
  Filled 2017-08-05: qty 20

## 2017-08-05 MED ORDER — IOPAMIDOL (ISOVUE-M 200) INJECTION 41%
INTRAMUSCULAR | Status: AC
  Filled 2017-08-05: qty 10

## 2017-08-05 MED ORDER — MIDAZOLAM HCL 2 MG/2ML IJ SOLN
5.0000 mg | Freq: Once | INTRAMUSCULAR | Status: AC
  Administered 2017-08-05: 4 mg via INTRAVENOUS

## 2017-08-05 MED ORDER — MIDAZOLAM HCL 5 MG/5ML IJ SOLN
INTRAMUSCULAR | Status: AC
  Filled 2017-08-05: qty 5

## 2017-08-05 MED ORDER — TRIAMCINOLONE ACETONIDE 40 MG/ML IJ SUSP
40.0000 mg | Freq: Once | INTRAMUSCULAR | Status: AC
  Administered 2017-08-05: 40 mg

## 2017-08-05 MED ORDER — SODIUM CHLORIDE 0.9 % IJ SOLN
INTRAMUSCULAR | Status: AC
  Filled 2017-08-05: qty 10

## 2017-08-05 MED ORDER — TRIAMCINOLONE ACETONIDE 40 MG/ML IJ SUSP
INTRAMUSCULAR | Status: AC
  Filled 2017-08-05: qty 1

## 2017-08-05 MED ORDER — ROPIVACAINE HCL 2 MG/ML IJ SOLN
10.0000 mL | Freq: Once | INTRAMUSCULAR | Status: AC
  Administered 2017-08-05: 1 mL via EPIDURAL

## 2017-08-05 MED ORDER — ROPIVACAINE HCL 2 MG/ML IJ SOLN
INTRAMUSCULAR | Status: AC
  Filled 2017-08-05: qty 10

## 2017-08-05 MED ORDER — LACTATED RINGERS IV SOLN
1000.0000 mL | INTRAVENOUS | Status: DC
  Administered 2017-08-05: 1000 mL via INTRAVENOUS

## 2017-08-05 MED ORDER — MELOXICAM 7.5 MG PO TABS: 7.5000 mg | ORAL_TABLET | Freq: Two times a day (BID) | ORAL | 2 refills | Status: DC

## 2017-08-05 MED ORDER — SODIUM CHLORIDE 0.9% FLUSH
10.0000 mL | Freq: Once | INTRAVENOUS | Status: AC
  Administered 2017-08-05: 5 mL

## 2017-08-05 MED ORDER — METHOCARBAMOL 750 MG PO TABS: 750.0000 mg | ORAL_TABLET | Freq: Three times a day (TID) | ORAL | 3 refills | Status: DC

## 2017-08-05 MED ORDER — LIDOCAINE HCL (PF) 1 % IJ SOLN
INTRAMUSCULAR | Status: AC
  Filled 2017-08-05: qty 5

## 2017-08-05 MED ORDER — LIDOCAINE HCL (PF) 1 % IJ SOLN
5.0000 mL | Freq: Once | INTRAMUSCULAR | Status: AC
  Administered 2017-08-05: 5 mL via SUBCUTANEOUS

## 2017-08-05 NOTE — Progress Notes (Signed)
Safety precautions to be maintained throughout the outpatient stay will include: orient to surroundings, keep bed in low position, maintain call bell within reach at all times, provide assistance with transfer out of bed and ambulation.  

## 2017-08-06 ENCOUNTER — Telehealth: Payer: Self-pay | Admitting: *Deleted

## 2017-08-06 NOTE — Progress Notes (Signed)
Subjective:  Patient ID: Hannah Maynard, female    DOB: [birth year]  Age: 52 y.o. MRN: [id number]  CC: Back Pain (low)   Procedure: L5-S1 epidural steroid under fluoroscopic guidance with moderate sedation  HPI Alanys R Wyeth presents for reevaluation and epidural steroid.  She was last seen in April and prior to that had an epidural steroid in March.  She describes a 75% reduction in her lower extremity pain and back pain lasting about 6 weeks to 8 weeks.  She has had some recurrence of the same quality characteristic and distribution of pain primarily right greater than left to the lower extremities.  She also has some low back spasming pain of the same nature as before.  She is taking her medications as prescribed with no new changes in the quality characteristic or distribution of her pain and based on her narcotic assessment sheet she continues to derive good functional lifestyle improvement with the medications.  These have been well-tolerated.  She is also been compliant with no evidence of diversion or illicit use.  She still takes her oxycodone 1 tablet approximately 3-4 times a day.  She is also taking Robaxin 3 times a day to help with muscle spasms and is asking for a refill on this  Outpatient Medications Prior to Visit  Medication Sig Dispense Refill  . ibuprofen (ADVIL,MOTRIN) 200 MG tablet Take 200 mg by mouth every 6 (six) hours as needed.    . Nerve Stimulator (ZEWA TENS/EMS COMBO UNIT) DEVI by Does not apply route.    Marland Kitchen oxyCODONE (OXY IR/ROXICODONE) 5 MG immediate release tablet Take 1 tablet (5 mg total) by mouth every 6 (six) hours as needed for severe pain. 120 tablet 0  . pregabalin (LYRICA) 75 MG capsule Take 2 capsules (150 mg total) by mouth 2 (two) times daily. 120 capsule 3  . meloxicam (MOBIC) 7.5 MG tablet Take 7.5 mg by mouth daily.    . methocarbamol (ROBAXIN) 750 MG tablet Take 1 tablet (750 mg total) by mouth 4 (four) times daily. 90 tablet 3  .  oxyCODONE-acetaminophen (PERCOCET) 7.5-325 MG tablet Take 1 tablet by mouth 3 (three) times daily. (Patient not taking: Reported on 08/05/2017) 90 tablet 0   Facility-Administered Medications Prior to Visit  Medication Dose Route Frequency Provider Last Rate Last Dose  . lidocaine (PF) (XYLOCAINE) 1 % injection 5 mL  5 mL Subcutaneous Once Molli Barrows, MD      . ropivacaine (PF) 2 mg/mL (0.2%) (NAROPIN) injection 10 mL  10 mL Epidural Once Molli Barrows, MD        Review of Systems CNS: No confusion or sedation Cardiac: No angina or palpitations GI: No abdominal pain or constipation Constitutional: No nausea vomiting fevers or chills  Objective:  BP 119/68   Pulse 68   Temp 97.7 F (36.5 C) (Temporal)   Resp 18   Ht 5\' 5"  (1.651 m)   Wt 230 lb (104.3 kg)   LMP  (LMP Unknown)   SpO2 100%   BMI 38.27 kg/m    BP Readings from Last 3 Encounters:  08/05/17 119/68  06/19/17 (!) 130/92  05/13/17 (!) 136/94     Wt Readings from Last 3 Encounters:  08/05/17 230 lb (104.3 kg)  06/19/17 234 lb (106.1 kg)  05/13/17 234 lb (106.1 kg)     Physical Exam Pt is alert and oriented PERRL EOMI HEART IS RRR no murmur or rub LCTA no wheezing or rales MUSCULOSKELETAL reveals some  paraspinous muscle tenderness but no overt trigger points.  Her muscle tone and bulk is at baseline.  She has a positive straight leg raise on the right side negative on the left.  Labs  No results found for: HGBA1C Lab Results  Component Value Date   CREATININE 0.83 01/13/2017    -------------------------------------------------------------------------------------------------------------------- Lab Results  Component Value Date   GLUCOSE 74 01/13/2017   AST 39 01/13/2017   NA 143 01/13/2017   K 4.3 01/13/2017   CL 105 01/13/2017   CREATININE 0.83 01/13/2017   BUN 15 01/13/2017     --------------------------------------------------------------------------------------------------------------------- Dg C-arm 1-60 Min-no Report  Result Date: 08/05/2017 Fluoroscopy was utilized by the requesting physician.  No radiographic interpretation.     Assessment & Plan:   [name] was seen today for back pain.  Diagnoses and all orders for this visit:  Chronic bilateral low back pain with bilateral sciatica  Lumbar spondylosis  Chronic pain syndrome  Chronic, continuous use of opioids  DDD (degenerative disc disease), cervical  Facet arthritis of lumbar region  Chronic neck pain  Musculoskeletal pain, chronic  Other orders -     meloxicam (MOBIC) 7.5 MG tablet; Take 1 tablet (7.5 mg total) by mouth 2 (two) times daily. -     triamcinolone acetonide (KENALOG-40) injection 40 mg -     sodium chloride flush (NS) 0.9 % injection 10 mL -     ropivacaine (PF) 2 mg/mL (0.2%) (NAROPIN) injection 10 mL -     lidocaine (PF) (XYLOCAINE) 1 % injection 5 mL -     iopamidol (ISOVUE-M) 41 % intrathecal injection 20 mL -     midazolam (VERSED) injection 5 mg -     lactated ringers infusion 1,000 mL -     methocarbamol (ROBAXIN) 750 MG tablet; Take 1 tablet (750 mg total) by mouth 3 (three) times daily.        ----------------------------------------------------------------------------------------------------------------------  Problem List Items Addressed This Visit      Unprioritized   Chronic low back pain - Primary (Chronic)   Relevant Medications   meloxicam (MOBIC) 7.5 MG tablet   triamcinolone acetonide (KENALOG-40) injection 40 mg (Completed)   methocarbamol (ROBAXIN) 750 MG tablet   Chronic neck pain   Relevant Medications   meloxicam (MOBIC) 7.5 MG tablet   triamcinolone acetonide (KENALOG-40) injection 40 mg (Completed)   ropivacaine (PF) 2 mg/mL (0.2%) (NAROPIN) injection 10 mL (Completed)   lidocaine (PF) (XYLOCAINE) 1 % injection 5 mL (Completed)    methocarbamol (ROBAXIN) 750 MG tablet   Chronic pain syndrome   Chronic, continuous use of opioids   DDD (degenerative disc disease), cervical   Relevant Medications   meloxicam (MOBIC) 7.5 MG tablet   triamcinolone acetonide (KENALOG-40) injection 40 mg (Completed)   methocarbamol (ROBAXIN) 750 MG tablet   Lumbar spondylosis   Relevant Medications   meloxicam (MOBIC) 7.5 MG tablet   triamcinolone acetonide (KENALOG-40) injection 40 mg (Completed)   methocarbamol (ROBAXIN) 750 MG tablet    Other Visit Diagnoses    Facet arthritis of lumbar region       Relevant Medications   meloxicam (MOBIC) 7.5 MG tablet   triamcinolone acetonide (KENALOG-40) injection 40 mg (Completed)   methocarbamol (ROBAXIN) 750 MG tablet   Musculoskeletal pain, chronic            ----------------------------------------------------------------------------------------------------------------------  1. Chronic bilateral low back pain with bilateral sciatica I feel it is imperative for her to continue her stretching strengthening exercises which she is currently not  doing.  She is active but not doing any regimented stretching exercises.  She continues to have a L5 sciatica right side greater than left and will proceed with a repeat epidural today.  We have gone over the risks and benefits of this with her in full detail all questions answered.  We will also have her return to clinic in 1 month for reevaluation and medication refill.  2. Lumbar spondylosis As above  3. Chronic pain syndrome As above.  She is to continue with her current regimen therapy.  We will also give her a refill on her methocarbamol.  4. Chronic, continuous use of opioids We reviewed the [facility] practitioner database information and it is appropriate.  She currently has a June for prescription for her oxycodone.  5. DDD (degenerative disc disease), cervical Continue efforts at weight loss stretching strengthening.  6.  Facet arthritis of lumbar region As above  7. Chronic neck pain As above  8. Musculoskeletal pain, chronic As above    ----------------------------------------------------------------------------------------------------------------------  I have changed Ahsley R. Donati's meloxicam and methocarbamol. I am also having her maintain her ibuprofen, ZEWA TENS/EMS COMBO UNIT, oxyCODONE-acetaminophen, oxyCODONE, and pregabalin. We administered triamcinolone acetonide, sodium chloride flush, ropivacaine (PF) 2 mg/mL (0.2%), lidocaine (PF), iopamidol, midazolam, and lactated ringers. We will continue to administer ropivacaine (PF) 2 mg/mL (0.2%) and lidocaine (PF).   Meds ordered this encounter  Medications  . meloxicam (MOBIC) 7.5 MG tablet    Sig: Take 1 tablet (7.5 mg total) by mouth 2 (two) times daily.    Dispense:  60 tablet    Refill:  2  . triamcinolone acetonide (KENALOG-40) injection 40 mg  . sodium chloride flush (NS) 0.9 % injection 10 mL  . ropivacaine (PF) 2 mg/mL (0.2%) (NAROPIN) injection 10 mL  . lidocaine (PF) (XYLOCAINE) 1 % injection 5 mL  . iopamidol (ISOVUE-M) 41 % intrathecal injection 20 mL  . midazolam (VERSED) injection 5 mg  . lactated ringers infusion 1,000 mL  . methocarbamol (ROBAXIN) 750 MG tablet    Sig: Take 1 tablet (750 mg total) by mouth 3 (three) times daily.    Dispense:  90 tablet    Refill:  3   Patient's Medications  New Prescriptions   No medications on file  Previous Medications   IBUPROFEN (ADVIL,MOTRIN) 200 MG TABLET    Take 200 mg by mouth every 6 (six) hours as needed.   NERVE STIMULATOR (ZEWA TENS/EMS COMBO UNIT) DEVI    by Does not apply route.   OXYCODONE (OXY IR/ROXICODONE) 5 MG IMMEDIATE RELEASE TABLET    Take 1 tablet (5 mg total) by mouth every 6 (six) hours as needed for severe pain.   OXYCODONE-ACETAMINOPHEN (PERCOCET) 7.5-325 MG TABLET    Take 1 tablet by mouth 3 (three) times daily.   PREGABALIN (LYRICA) 75 MG CAPSULE    Take  2 capsules (150 mg total) by mouth 2 (two) times daily.  Modified Medications   Modified Medication Previous Medication   MELOXICAM (MOBIC) 7.5 MG TABLET meloxicam (MOBIC) 7.5 MG tablet      Take 1 tablet (7.5 mg total) by mouth 2 (two) times daily.    Take 7.5 mg by mouth daily.   METHOCARBAMOL (ROBAXIN) 750 MG TABLET methocarbamol (ROBAXIN) 750 MG tablet      Take 1 tablet (750 mg total) by mouth 3 (three) times daily.    Take 1 tablet (750 mg total) by mouth 4 (four) times daily.  Discontinued Medications   No  medications on file   ----------------------------------------------------------------------------------------------------------------------  Follow-up: Return for evaluation, med refill.  Procedure: Procedure: L5-S1 LESI with fluoroscopic guidance and moderate sedation  NOTE: The risks, benefits, and expectations of the procedure have been discussed and explained to the patient who was understanding and in agreement with suggested treatment plan. No guarantees were made.  DESCRIPTION OF PROCEDURE: Lumbar epidural steroid injection with 4 IV Versed, EKG, blood pressure, pulse, and pulse oximetry monitoring. The procedure was performed with the patient in the prone position under fluoroscopic guidance.  Sterile prep x3 was initiated and I then injected subcutaneous lidocaine to the overlying L5-S1 site after its fluoroscopic identifictation.  Using strict aseptic technique, I then advanced an 18-gauge Tuohy epidural needle in the midline using interlaminar approach via loss-of-resistance to saline technique. There was negative aspiration for heme or  CSF.  I then confirmed position with both AP and Lateral fluoroscan.  2 cc of Isovue were injected and a  total of 5 mL of Preservative-Free normal saline mixed with 40 mg of Kenalog and 1cc Ropicaine 0.2 percent were injected incrementally via the  epidurally placed needle. The needle was removed. The patient tolerated the injection well and  was convalesced and discharged to home in stable condition. Should the patient have any post procedure difficulty they have been instructed on how to contact us for assistance.    Molli Barrows, MD

## 2017-08-06 NOTE — Telephone Encounter (Signed)
Attempted to call for post procedure follow-up. No answer, unable to leave message. 

## 2017-08-27 ENCOUNTER — Encounter: Payer: Self-pay | Admitting: Nurse Practitioner

## 2017-08-27 ENCOUNTER — Ambulatory Visit: Payer: Medicaid Other | Attending: Anesthesiology | Admitting: Nurse Practitioner

## 2017-08-27 VITALS — BP 134/88 | HR 79 | Temp 97.9°F | Resp 16 | Ht 65.0 in | Wt 241.6 lb

## 2017-08-27 DIAGNOSIS — M5441 Lumbago with sciatica, right side: Secondary | ICD-10-CM | POA: Insufficient documentation

## 2017-08-27 DIAGNOSIS — M79602 Pain in left arm: Secondary | ICD-10-CM | POA: Insufficient documentation

## 2017-08-27 DIAGNOSIS — Z880 Allergy status to penicillin: Secondary | ICD-10-CM | POA: Diagnosis not present

## 2017-08-27 DIAGNOSIS — G894 Chronic pain syndrome: Secondary | ICD-10-CM | POA: Diagnosis not present

## 2017-08-27 DIAGNOSIS — Z888 Allergy status to other drugs, medicaments and biological substances status: Secondary | ICD-10-CM | POA: Diagnosis not present

## 2017-08-27 DIAGNOSIS — Z79891 Long term (current) use of opiate analgesic: Secondary | ICD-10-CM | POA: Diagnosis not present

## 2017-08-27 DIAGNOSIS — F329 Major depressive disorder, single episode, unspecified: Secondary | ICD-10-CM | POA: Diagnosis not present

## 2017-08-27 DIAGNOSIS — Z800 Family history of malignant neoplasm of digestive organs: Secondary | ICD-10-CM | POA: Diagnosis not present

## 2017-08-27 DIAGNOSIS — Z803 Family history of malignant neoplasm of breast: Secondary | ICD-10-CM | POA: Insufficient documentation

## 2017-08-27 DIAGNOSIS — M5442 Lumbago with sciatica, left side: Secondary | ICD-10-CM | POA: Diagnosis not present

## 2017-08-27 DIAGNOSIS — E669 Obesity, unspecified: Secondary | ICD-10-CM | POA: Insufficient documentation

## 2017-08-27 DIAGNOSIS — Z9889 Other specified postprocedural states: Secondary | ICD-10-CM | POA: Diagnosis not present

## 2017-08-27 DIAGNOSIS — M79601 Pain in right arm: Secondary | ICD-10-CM | POA: Diagnosis not present

## 2017-08-27 DIAGNOSIS — M542 Cervicalgia: Secondary | ICD-10-CM | POA: Insufficient documentation

## 2017-08-27 DIAGNOSIS — F17210 Nicotine dependence, cigarettes, uncomplicated: Secondary | ICD-10-CM | POA: Diagnosis not present

## 2017-08-27 DIAGNOSIS — Z791 Long term (current) use of non-steroidal anti-inflammatories (NSAID): Secondary | ICD-10-CM | POA: Insufficient documentation

## 2017-08-27 DIAGNOSIS — G8929 Other chronic pain: Secondary | ICD-10-CM

## 2017-08-27 DIAGNOSIS — Z79899 Other long term (current) drug therapy: Secondary | ICD-10-CM | POA: Insufficient documentation

## 2017-08-27 DIAGNOSIS — F419 Anxiety disorder, unspecified: Secondary | ICD-10-CM | POA: Insufficient documentation

## 2017-08-27 MED ORDER — OXYCODONE HCL 5 MG PO TABS: 5.0000 mg | ORAL_TABLET | Freq: Four times a day (QID) | ORAL | 0 refills | Status: DC | PRN

## 2017-08-27 MED ORDER — METHOCARBAMOL 750 MG PO TABS: 750.0000 mg | ORAL_TABLET | Freq: Three times a day (TID) | ORAL | 3 refills | Status: DC

## 2017-08-27 MED ORDER — MELOXICAM 7.5 MG PO TABS: 7.5000 mg | ORAL_TABLET | Freq: Two times a day (BID) | ORAL | 2 refills | Status: DC

## 2017-08-27 MED ORDER — PREGABALIN 75 MG PO CAPS: 150.0000 mg | ORAL_CAPSULE | Freq: Two times a day (BID) | ORAL | 3 refills | Status: DC

## 2017-08-27 NOTE — Progress Notes (Signed)
Patient's Name: Hannah Maynard  MRN: [id number]  Referring Provider: Nathaneil Canary, [place]*  DOB: February 16, 1966  PCP: Nathaneil Canary, PA-C  DOS: 08/27/2017  Note by: Vevelyn Francois NP  Service setting: Ambulatory outpatient  Specialty: Interventional Pain Management  Location: [facility] (AMB) Pain Management Facility    Patient type: Established    Primary Reason(s) for Visit: Encounter for prescription drug management. (Level of risk: moderate)  CC: Back Pain (lower right side )  HPI  Hannah Maynard is a 52 y.o. year old, female patient, who comes today for a medication management evaluation. She has Lumbar spondylosis; Obesity; Tobacco use; Right-sided low back pain with right-sided sciatica; Hot flushes, perimenopausal; Climacteric; Chronic low back pain; Chronic upper extremity pain; Chronic pain syndrome; Chronic neck pain; Other long term (current) drug therapy; Other reduced mobility; Other specified health status; Chronic, continuous use of opioids; DDD (degenerative disc disease), cervical; and Cervicalgia on their problem list. Her primarily concern today is the Back Pain (lower right side )  Pain Assessment: Location: Lower, Right Back Radiating: down both legs into toes, feels as if there is some "gunk" between the toes  Onset: More than a month ago Duration: Chronic pain Quality: Numbness, Aching, Burning, Throbbing, Discomfort, Constant Severity: 6 /10 (subjective, self-reported pain score)  Note: Reported level is compatible with observation. Clinically the patient looks like a 2/10 A 2/10 is viewed as "Mild to Moderate" and described as noticeable and distracting. Impossible to hide from other people. More frequent flare-ups. Still possible to adapt and function close to normal. It can be very annoying and may have occasional stronger flare-ups. With discipline, patients may get used to it and adapt.       When using our objective Pain Scale, levels between 6 and 10/10 are said to  belong in an emergency room, as it progressively worsens from a 6/10, described as severely limiting, requiring emergency care not usually available at an outpatient pain management facility. At a 6/10 level, communication becomes difficult and requires great effort. Assistance to reach the emergency department may be required. Facial flushing and profuse sweating along with potentially dangerous increases in heart rate and blood pressure will be evident. Effect on ADL: procedure has help improve her level of activity.  difficult to stand for long periods of time.  Timing: Constant Modifying factors: procedure helps for a little while, medications BP: 134/88  HR: 79  Hannah Maynard was last scheduled for an appointment on 03/27/2017 for medication management. During today's appointment we reviewed Hannah Maynard's chronic pain status, as well as her outpatient medication regimen.  She admits that her pain is persistent.  She is requesting an increase in her pain medication.  She is currently on oxycodone 5 mg 4 times daily.  She would like to go to oxycodone 10 mg.  She admits that she tried oxycodone 7.5 mg however this caused constipation.  She feels like she would do better if she could have additional oxycodone.  She admits that her interventional procedures are effective and that she may sometimes overdo her activities that causes the pain to return sooner.  The patient  reports that she does not use drugs. Her body mass index is 40.2 kg/m.  Further details on both, my assessment(s), as well as the proposed treatment plan, please see below.  Controlled Substance Pharmacotherapy Assessment REMS (Risk Evaluation and Mitigation Strategy)  Analgesic: Oxycodone 5 mg 4 times daily MME/day: 30 mg/day.  Janett Billow, RN  08/27/2017  11:27 AM  Sign at close encounter Nursing Pain Medication Assessment:  Safety precautions to be maintained throughout the outpatient stay will include: orient to  surroundings, keep bed in low position, maintain call bell within reach at all times, provide assistance with transfer out of bed and ambulation.  Medication Inspection Compliance: Pill count conducted under aseptic conditions, in front of the patient. Neither the pills nor the bottle was removed from the patient's sight at any time. Once count was completed pills were immediately returned to the patient in their original bottle.  Medication: Oxycodone IR Pill/Patch Count: 5 of 120 pills remain Pill/Patch Appearance: Markings consistent with prescribed medication Bottle Appearance: Standard pharmacy container. Clearly labeled. Filled Date: 06 / 04 / 2019 Last Medication intake:  Today   Pharmacokinetics: Liberation and absorption (onset of action): WNL Distribution (time to peak effect): WNL Metabolism and excretion (duration of action): WNL         Pharmacodynamics: Desired effects: Analgesia: Hannah Maynard reports >50% benefit. Functional ability: Patient reports that medication allows her to accomplish basic ADLs Clinically meaningful improvement in function (CMIF): Sustained CMIF goals met Perceived effectiveness: Described as relatively effective, allowing for increase in activities of daily living (ADL) Undesirable effects: Side-effects or Adverse reactions: None reported Monitoring: [place] PMP: Online review of the past 36-monthperiod conducted. Compliant with practice rules and regulations Last UDS on record: Summary  Date Value Ref Range Status  01/13/2017 FINAL  Final    Comment:    ==================================================================== TOXASSURE SELECT 13 (MW) ==================================================================== Test                             Result       Flag       Units Drug Present and Declared for Prescription Verification   Oxycodone                      159          EXPECTED   ng/mg creat   Oxymorphone                    25            EXPECTED   ng/mg creat   Noroxycodone                   168          EXPECTED   ng/mg creat    Sources of oxycodone include scheduled prescription medications.    Oxymorphone and noroxycodone are expected metabolites of    oxycodone. Oxymorphone is also available as a scheduled    prescription medication.   Tramadol                       122          EXPECTED   ng/mg creat   O-Desmethyltramadol            40           EXPECTED   ng/mg creat    Source of tramadol is a prescription medication.    O-desmethyltramadol is an expected metabolite of tramadol. ==================================================================== Test                      Result    Flag   Units      Ref Range   Creatinine  251              mg/dL      >=20 ==================================================================== Declared Medications:  The flagging and interpretation on this report are based on the  following declared medications.  Unexpected results may arise from  inaccuracies in the declared medications.  **Note: The testing scope of this panel includes these medications:  Oxycodone  Tramadol  **Note: The testing scope of this panel does not include following  reported medications:  Ibuprofen  Methocarbamol  Paroxetine  Pregabalin ==================================================================== For clinical consultation, please call [phone]. ====================================================================    UDS interpretation: Compliant          Medication Assessment Form: Reviewed. Patient indicates being compliant with therapy Treatment compliance: Compliant Risk Assessment Profile: Aberrant behavior: See prior evaluations. None observed or detected today Comorbid factors increasing risk of overdose: See prior notes. No additional risks detected today Risk of substance use disorder (SUD): Low Opioid Risk Tool - 08/05/17 1200      Family History of Substance  Abuse   Alcohol  Negative    Illegal Drugs  Negative    Rx Drugs  Negative      Personal History of Substance Abuse   Alcohol  Negative    Illegal Drugs  Negative    Rx Drugs  Negative      Age   Age between 81-45 years   No      Psychological Disease   Psychological Disease  Negative    Depression  Positive      Total Score   Opioid Risk Tool Scoring  1    Opioid Risk Interpretation  Low Risk      ORT Scoring interpretation table:  Score <3 = Low Risk for SUD  Score between 4-7 = Moderate Risk for SUD  Score >8 = High Risk for Opioid Abuse   Risk Mitigation Strategies:  Patient Counseling: Covered Patient-Prescriber Agreement (PPA): Present and active  Notification to other healthcare providers: Done  Pharmacologic Plan: No change in therapy, at this time.             Laboratory Chemistry  Inflammation Markers (CRP: Acute Phase) (ESR: Chronic Phase) Lab Results  Component Value Date   CRP 1.2 01/13/2017   ESRSEDRATE 10 01/13/2017                         Rheumatology Markers No results found for: RF, ANA, LABURIC, URICUR, LYMEIGGIGMAB, LYMEABIGMQN, HLAB27                      Renal Function Markers Lab Results  Component Value Date   BUN 15 01/13/2017   CREATININE 0.83 01/13/2017   BCR 18 01/13/2017   GFRAA 93 01/13/2017   GFRNONAA 81 01/13/2017                             Hepatic Function Markers Lab Results  Component Value Date   AST 39 01/13/2017   ALBUMIN 4.0 01/13/2017   ALKPHOS 117 01/13/2017                        Electrolytes Lab Results  Component Value Date   NA 143 01/13/2017   K 4.3 01/13/2017   CL 105 01/13/2017   CALCIUM 9.5 01/13/2017   MG 1.8 01/13/2017  Neuropathy Markers No results found for: VITAMINB12, FOLATE, HGBA1C, HIV                      Bone Pathology Markers No results found for: VD25OH, [id number], [id number], HB7169CV8, 25OHVITD1, 25OHVITD2, 25OHVITD3, TESTOFREE, TESTOSTERONE                        Coagulation Parameters No results found for: INR, LABPROT, APTT, PLT, DDIMER                      Cardiovascular Markers No results found for: BNP, CKTOTAL, CKMB, TROPONINI, HGB, HCT                       CA Markers No results found for: CEA, CA125, LABCA2                      Note: Lab results reviewed.  Recent Diagnostic Imaging Results  DG C-Arm 1-60 Min-No Report Fluoroscopy was utilized by the requesting physician.  No radiographic  interpretation.   Complexity Note: Imaging results reviewed. Results shared with Hannah Maynard, using Layman's terms.                         Meds   Current Outpatient Medications:  .  ibuprofen (ADVIL,MOTRIN) 200 MG tablet, Take 200 mg by mouth every 6 (six) hours as needed., Disp: , Rfl:  .  meloxicam (MOBIC) 7.5 MG tablet, Take 1 tablet (7.5 mg total) by mouth 2 (two) times daily., Disp: 60 tablet, Rfl: 2 .  methocarbamol (ROBAXIN) 750 MG tablet, Take 1 tablet (750 mg total) by mouth 3 (three) times daily., Disp: 90 tablet, Rfl: 3 .  Nerve Stimulator (ZEWA TENS/EMS COMBO UNIT) DEVI, by Does not apply route., Disp: , Rfl:  .  oxyCODONE (OXY IR/ROXICODONE) 5 MG immediate release tablet, Take 1 tablet (5 mg total) by mouth every 6 (six) hours as needed for severe pain., Disp: 120 tablet, Rfl: 0 .  pregabalin (LYRICA) 75 MG capsule, Take 2 capsules (150 mg total) by mouth 2 (two) times daily., Disp: 120 capsule, Rfl: 3 .  oxyCODONE (OXY IR/ROXICODONE) 5 MG immediate release tablet, Take 1 tablet (5 mg total) by mouth every 6 (six) hours as needed for severe pain., Disp: 120 tablet, Rfl: 0  Current Facility-Administered Medications:  .  lidocaine (PF) (XYLOCAINE) 1 % injection 5 mL, 5 mL, Subcutaneous, Once, Adams, Alvina Filbert, MD .  ropivacaine (PF) 2 mg/mL (0.2%) (NAROPIN) injection 10 mL, 10 mL, Epidural, Once, Adams, Alvina Filbert, MD  ROS  Constitutional: Denies any fever or chills Gastrointestinal: No reported hemesis, hematochezia, vomiting, or  acute GI distress Musculoskeletal: Denies any acute onset joint swelling, redness, loss of ROM, or weakness Neurological: No reported episodes of acute onset apraxia, aphasia, dysarthria, agnosia, amnesia, paralysis, loss of coordination, or loss of consciousness  Allergies  Hannah Maynard is allergic to duloxetine and penicillins.  PFSH  Drug: Hannah Maynard  reports that she does not use drugs. Alcohol:  reports that she does not drink alcohol. Tobacco:  reports that she has been smoking cigarettes.  She has never used smokeless tobacco. Medical:  has a past medical history of Anxiety, Chronic back pain, and Depression. Surgical: Hannah Maynard  has a past surgical history that includes Lipoma excision. Family: family history includes Breast cancer in her paternal aunt; Pancreatic cancer in  her father; Stomach cancer in her maternal uncle.  Constitutional Exam  General appearance: Well nourished, well developed, and well hydrated. In no apparent acute distress Vitals:   08/27/17 1115  BP: 134/88  Pulse: 79  Resp: 16  Temp: 97.9 F (36.6 C)  TempSrc: Oral  SpO2: 100%  Weight: 241 lb 9.6 oz (109.6 kg)  Height: _0  (1.651 m)  Psych/Mental status: Alert, oriented x 3 (person, place, & time)       Eyes: PERLA Respiratory: No evidence of acute respiratory distress  Lumbar Spine Area Exam  Skin & Axial Inspection: No masses, redness, or swelling Alignment: Symmetrical Functional ROM: Unrestricted ROM       Stability: No instability detected Muscle Tone/Strength: Functionally intact. No obvious neuro-muscular anomalies detected. Sensory (Neurological): Unimpaired Palpation: No palpable anomalies       Provocative Tests: Lumbar Hyperextension/rotation test: deferred today       Lumbar quadrant test (Kemp's test): deferred today       Lumbar Lateral bending test: deferred today       Patrick's Maneuver: deferred today                   FABER test: deferred today       Thigh-thrust test:  deferred today       S-I compression test: deferred today       S-I distraction test: deferred today        Gait & Posture Assessment  Ambulation: Patient ambulates using a cane Gait: Relatively normal for age and body habitus Posture: WNL   Lower Extremity Exam    Side: Right lower extremity  Side: Left lower extremity  Stability: No instability observed          Stability: No instability observed          Skin & Extremity Inspection: Skin color, temperature, and hair growth are WNL. No peripheral edema or cyanosis. No masses, redness, swelling, asymmetry, or associated skin lesions. No contractures.  Skin & Extremity Inspection: Skin color, temperature, and hair growth are WNL. No peripheral edema or cyanosis. No masses, redness, swelling, asymmetry, or associated skin lesions. No contractures.  Functional ROM: Unrestricted ROM                  Functional ROM: Unrestricted ROM                  Muscle Tone/Strength: Functionally intact. No obvious neuro-muscular anomalies detected.  Muscle Tone/Strength: Functionally intact. No obvious neuro-muscular anomalies detected.  Sensory (Neurological): Unimpaired  Sensory (Neurological): Unimpaired  Palpation: No palpable anomalies  Palpation: No palpable anomalies   Assessment  Primary Diagnosis & Pertinent Problem List: The primary encounter diagnosis was Chronic pain of both upper extremities. Diagnoses of Chronic bilateral low back pain with bilateral sciatica, Chronic pain syndrome, Chronic right-sided low back pain with right-sided sciatica, and Cervicalgia were also pertinent to this visit.  Status Diagnosis  Persistent Persistent Persistent 1. Chronic pain of both upper extremities   2. Chronic bilateral low back pain with bilateral sciatica   3. Chronic pain syndrome   4. Chronic right-sided low back pain with right-sided sciatica   5. Cervicalgia     Problems updated and reviewed during this visit: No problems updated. Plan of  Care  Pharmacotherapy (Medications Ordered): Meds ordered this encounter  Medications  . pregabalin (LYRICA) 75 MG capsule    Sig: Take 2 capsules (150 mg total) by mouth 2 (two) times daily.  Dispense:  120 capsule    Refill:  3    Order Specific Question:   Supervising Provider    Answer:   Milinda Pointer [phone]  . methocarbamol (ROBAXIN) 750 MG tablet    Sig: Take 1 tablet (750 mg total) by mouth 3 (three) times daily.    Dispense:  90 tablet    Refill:  3    Order Specific Question:   Supervising Provider    Answer:   Milinda Pointer [phone]  . meloxicam (MOBIC) 7.5 MG tablet    Sig: Take 1 tablet (7.5 mg total) by mouth 2 (two) times daily.    Dispense:  60 tablet    Refill:  2    Order Specific Question:   Supervising Provider    Answer:   Milinda Pointer [phone]  . oxyCODONE (OXY IR/ROXICODONE) 5 MG immediate release tablet    Sig: Take 1 tablet (5 mg total) by mouth every 6 (six) hours as needed for severe pain.    Dispense:  120 tablet    Refill:  0    Do not fill until 08/27/2017    Order Specific Question:   Supervising Provider    Answer:   Milinda Pointer [phone]  . oxyCODONE (OXY IR/ROXICODONE) 5 MG immediate release tablet    Sig: Take 1 tablet (5 mg total) by mouth every 6 (six) hours as needed for severe pain.    Dispense:  120 tablet    Refill:  0    Do not fill until 09/26/2017    Order Specific Question:   Supervising Provider    Answer:   Milinda Pointer [phone]   New Prescriptions   No medications on file   Medications administered today: Abe People Herren had no medications administered during this visit. Lab-work, procedure(s), and/or referral(s): No orders of the defined types were placed in this encounter.  Imaging and/or referral(s): None  Interventional therapies: Planned, scheduled, and/or pending:   Not at this time.  Declined increase in Lyrica.  Will call for repeat interventional therapy in approximately 2 months.      Provider-requested follow-up: Return in about 2 months (around 10/28/2017) for MedMgmt with Me Donella Stade Edison Pace).  Future Appointments  Date Time Provider [place]  10/28/2017 10:30 AM Vevelyn Francois, NP [facility] None   Primary Care Physician: Gunnar Bulla Location: [facility] Note by: Vevelyn Francois NP Date: 08/27/2017; Time: 4:48 PM  Pain Score Disclaimer: We use the NRS-11 scale. This is a self-reported, subjective measurement of pain severity with only modest accuracy. It is used primarily to identify changes within a particular patient. It must be understood that outpatient pain scales are significantly less accurate that those used for research, where they can be applied under ideal controlled circumstances with minimal exposure to variables. In reality, the score is likely to be a combination of pain intensity and pain affect, where pain affect describes the degree of emotional arousal or changes in action readiness caused by the sensory experience of pain. Factors such as social and work situation, setting, emotional state, anxiety levels, expectation, and prior pain experience may influence pain perception and show large inter-individual differences that may also be affected by time variables.  Patient instructions provided during this appointment: Patient Instructions  Oxycodone 5 mg x 2 months to begin filling today, 08/27/17 Lyrica 75 mg x 4 months given to patient  Medication Rules  Applies to: All patients receiving prescriptions (written or electronic).  Pharmacy  of record: Pharmacy where electronic prescriptions will be sent. If written prescriptions are taken to a different pharmacy, please inform the nursing staff. The pharmacy listed in the electronic medical record should be the one where you would like electronic prescriptions to be sent.  Prescription refills: Only during scheduled appointments. Applies to both,  written and electronic prescriptions.  NOTE: The following applies primarily to controlled substances (Opioid* Pain Medications).   Patient's responsibilities: 1. Pain Pills: Bring all pain pills to every appointment (except for procedure appointments). 2. Pill Bottles: Bring pills in original pharmacy bottle. Always bring newest bottle. Bring bottle, even if empty. 3. Medication refills: You are responsible for knowing and keeping track of what medications you need refilled. The day before your appointment, write a list of all prescriptions that need to be refilled. Bring that list to your appointment and give it to the admitting nurse. Prescriptions will be written only during appointments. If you forget a medication, it will not be "Called in", "Faxed", or "electronically sent". You will need to get another appointment to get these prescribed. 4. Prescription Accuracy: You are responsible for carefully inspecting your prescriptions before leaving our office. Have the discharge nurse carefully go over each prescription with you, before taking them home. Make sure that your name is accurately spelled, that your address is correct. Check the name and dose of your medication to make sure it is accurate. Check the number of pills, and the written instructions to make sure they are clear and accurate. Make sure that you are given enough medication to last until your next medication refill appointment. 5. Taking Medication: Take medication as prescribed. Never take more pills than instructed. Never take medication more frequently than prescribed. Taking less pills or less frequently is permitted and encouraged, when it comes to controlled substances (written prescriptions).  6. Inform other Doctors: Always inform, all of your healthcare providers, of all the medications you take. 7. Pain Medication from other Providers: You are not allowed to accept any additional pain medication from any other Doctor or  Healthcare provider. There are two exceptions to this rule. (see below) In the event that you require additional pain medication, you are responsible for notifying us, as stated below. 8. Medication Agreement: You are responsible for carefully reading and following our Medication Agreement. This must be signed before receiving any prescriptions from our practice. Safely store a copy of your signed Agreement. Violations to the Agreement will result in no further prescriptions. (Additional copies of our Medication Agreement are available upon request.) 9. Laws, Rules, & Regulations: All patients are expected to follow all Federal and [employer], [employer], Rules, [employer]. Ignorance of the Laws does not constitute a valid excuse. The use of any illegal substances is prohibited. 10. Adopted CDC guidelines & recommendations: Target dosing levels will be at or below 60 MME/day. Use of benzodiazepines** is not recommended.  Exceptions: There are only two exceptions to the rule of not receiving pain medications from other Healthcare Providers. 1. Exception #1 (Emergencies): In the event of an emergency (i.e.: accident requiring emergency care), you are allowed to receive additional pain medication. However, you are responsible for: As soon as you are able, call our office [phone], at any time of the day or night, and leave a message stating your name, the date and nature of the emergency, and the name and dose of the medication prescribed. In the event that your call is answered by a member of our staff, make  sure to document and save the date, time, and the name of the person that took your information.  2. Exception #2 (Planned Surgery): In the event that you are scheduled by another doctor or dentist to have any type of surgery or procedure, you are allowed (for a period no longer than 30 days), to receive additional pain medication, for the acute post-op pain. However, in this case, you are  responsible for picking up a copy of our "Post-op Pain Management for Surgeons" handout, and giving it to your surgeon or dentist. This document is available at our office, and does not require an appointment to obtain it. Simply go to our office during business hours (Monday-Thursday from 8:00 AM to 4:00 PM) (Friday 8:00 AM to 12:00 Noon) or if you have a scheduled appointment with [place], prior to your surgery, and ask for it by name. In addition, you will need to provide [place] with your name, name of your surgeon, type of surgery, and date of procedure or surgery.  *Opioid medications include: morphine, codeine, oxycodone, oxymorphone, hydrocodone, hydromorphone, meperidine, tramadol, tapentadol, buprenorphine, fentanyl, methadone. **Benzodiazepine medications include: diazepam (Valium), alprazolam (Xanax), clonazepam (Klonopine), lorazepam (Ativan), clorazepate (Tranxene), chlordiazepoxide (Librium), estazolam (Prosom), oxazepam (Serax), temazepam (Restoril), triazolam (Halcion) (Last updated: 04/24/2017) ____________________________________________________________________________________________

## 2017-08-27 NOTE — Progress Notes (Signed)
Nursing Pain Medication Assessment:  Safety precautions to be maintained throughout the outpatient stay will include: orient to surroundings, keep bed in low position, maintain call bell within reach at all times, provide assistance with transfer out of bed and ambulation.  Medication Inspection Compliance: Pill count conducted under aseptic conditions, in front of the patient. Neither the pills nor the bottle was removed from the patient's sight at any time. Once count was completed pills were immediately returned to the patient in their original bottle.  Medication: Oxycodone IR Pill/Patch Count: 5 of 120 pills remain Pill/Patch Appearance: Markings consistent with prescribed medication Bottle Appearance: Standard pharmacy container. Clearly labeled. Filled Date: 06 / 04 / 2019 Last Medication intake:  Today

## 2017-08-27 NOTE — Patient Instructions (Addendum)
Oxycodone 5 mg x 2 months to begin filling today, 08/27/17 Lyrica 75 mg x 4 months given to patient  Medication Rules  Applies to: All patients receiving prescriptions (written or electronic).  Pharmacy of record: Pharmacy where electronic prescriptions will be sent. If written prescriptions are taken to a different pharmacy, please inform the nursing staff. The pharmacy listed in the electronic medical record should be the one where you would like electronic prescriptions to be sent.  Prescription refills: Only during scheduled appointments. Applies to both, written and electronic prescriptions.  NOTE: The following applies primarily to controlled substances (Opioid* Pain Medications).   Patient's responsibilities: 1. Pain Pills: Bring all pain pills to every appointment (except for procedure appointments). 2. Pill Bottles: Bring pills in original pharmacy bottle. Always bring newest bottle. Bring bottle, even if empty. 3. Medication refills: You are responsible for knowing and keeping track of what medications you need refilled. The day before your appointment, write a list of all prescriptions that need to be refilled. Bring that list to your appointment and give it to the admitting nurse. Prescriptions will be written only during appointments. If you forget a medication, it will not be "Called in", "Faxed", or "electronically sent". You will need to get another appointment to get these prescribed. 4. Prescription Accuracy: You are responsible for carefully inspecting your prescriptions before leaving our office. Have the discharge nurse carefully go over each prescription with you, before taking them home. Make sure that your name is accurately spelled, that your address is correct. Check the name and dose of your medication to make sure it is accurate. Check the number of pills, and the written instructions to make sure they are clear and accurate. Make sure that you are given enough medication  to last until your next medication refill appointment. 5. Taking Medication: Take medication as prescribed. Never take more pills than instructed. Never take medication more frequently than prescribed. Taking less pills or less frequently is permitted and encouraged, when it comes to controlled substances (written prescriptions).  6. Inform other Doctors: Always inform, all of your healthcare providers, of all the medications you take. 7. Pain Medication from other Providers: You are not allowed to accept any additional pain medication from any other Doctor or Healthcare provider. There are two exceptions to this rule. (see below) In the event that you require additional pain medication, you are responsible for notifying us, as stated below. 8. Medication Agreement: You are responsible for carefully reading and following our Medication Agreement. This must be signed before receiving any prescriptions from our practice. Safely store a copy of your signed Agreement. Violations to the Agreement will result in no further prescriptions. (Additional copies of our Medication Agreement are available upon request.) 9. Laws, Rules, & Regulations: All patients are expected to follow all Federal and [employer], [employer], Rules, [employer]. Ignorance of the Laws does not constitute a valid excuse. The use of any illegal substances is prohibited. 10. Adopted CDC guidelines & recommendations: Target dosing levels will be at or below 60 MME/day. Use of benzodiazepines** is not recommended.  Exceptions: There are only two exceptions to the rule of not receiving pain medications from other Healthcare Providers. 1. Exception #1 (Emergencies): In the event of an emergency (i.e.: accident requiring emergency care), you are allowed to receive additional pain medication. However, you are responsible for: As soon as you are able, call our office [phone], at any time of the day or night, and leave a message  stating your  name, the date and nature of the emergency, and the name and dose of the medication prescribed. In the event that your call is answered by a member of our staff, make sure to document and save the date, time, and the name of the person that took your information.  2. Exception #2 (Planned Surgery): In the event that you are scheduled by another doctor or dentist to have any type of surgery or procedure, you are allowed (for a period no longer than 30 days), to receive additional pain medication, for the acute post-op pain. However, in this case, you are responsible for picking up a copy of our "Post-op Pain Management for Surgeons" handout, and giving it to your surgeon or dentist. This document is available at our office, and does not require an appointment to obtain it. Simply go to our office during business hours (Monday-Thursday from 8:00 AM to 4:00 PM) (Friday 8:00 AM to 12:00 Noon) or if you have a scheduled appointment with [place], prior to your surgery, and ask for it by name. In addition, you will need to provide [place] with your name, name of your surgeon, type of surgery, and date of procedure or surgery.  *Opioid medications include: morphine, codeine, oxycodone, oxymorphone, hydrocodone, hydromorphone, meperidine, tramadol, tapentadol, buprenorphine, fentanyl, methadone. **Benzodiazepine medications include: diazepam (Valium), alprazolam (Xanax), clonazepam (Klonopine), lorazepam (Ativan), clorazepate (Tranxene), chlordiazepoxide (Librium), estazolam (Prosom), oxazepam (Serax), temazepam (Restoril), triazolam (Halcion) (Last updated: 04/24/2017) ____________________________________________________________________________________________

## 2017-09-09 ENCOUNTER — Encounter: Payer: Medicaid Other | Admitting: Anesthesiology

## 2017-09-29 ENCOUNTER — Telehealth: Payer: Self-pay | Admitting: Anesthesiology

## 2017-09-29 NOTE — Telephone Encounter (Signed)
Patient received notice she needs prior auth, not allowed over 90 days. Please call patient if you have any questions about the letter.

## 2017-09-29 NOTE — Telephone Encounter (Signed)
Spoke with patient and she needs PA for oxycodone 5 mg.

## 2017-10-07 ENCOUNTER — Other Ambulatory Visit: Payer: Self-pay | Admitting: Physician Assistant

## 2017-10-07 DIAGNOSIS — Z1231 Encounter for screening mammogram for malignant neoplasm of breast: Secondary | ICD-10-CM

## 2017-10-28 ENCOUNTER — Ambulatory Visit: Payer: Medicaid Other | Attending: Nurse Practitioner | Admitting: Nurse Practitioner

## 2017-10-28 ENCOUNTER — Encounter: Payer: Self-pay | Admitting: Nurse Practitioner

## 2017-10-28 ENCOUNTER — Other Ambulatory Visit: Payer: Self-pay

## 2017-10-28 VITALS — BP 121/92 | HR 65 | Temp 98.2°F | Resp 16 | Ht 65.0 in | Wt 238.0 lb

## 2017-10-28 DIAGNOSIS — Z6839 Body mass index (BMI) 39.0-39.9, adult: Secondary | ICD-10-CM | POA: Insufficient documentation

## 2017-10-28 DIAGNOSIS — Z880 Allergy status to penicillin: Secondary | ICD-10-CM | POA: Diagnosis not present

## 2017-10-28 DIAGNOSIS — Z803 Family history of malignant neoplasm of breast: Secondary | ICD-10-CM | POA: Diagnosis not present

## 2017-10-28 DIAGNOSIS — Z79899 Other long term (current) drug therapy: Secondary | ICD-10-CM | POA: Insufficient documentation

## 2017-10-28 DIAGNOSIS — Z79891 Long term (current) use of opiate analgesic: Secondary | ICD-10-CM | POA: Insufficient documentation

## 2017-10-28 DIAGNOSIS — M47816 Spondylosis without myelopathy or radiculopathy, lumbar region: Secondary | ICD-10-CM | POA: Diagnosis not present

## 2017-10-28 DIAGNOSIS — Z720 Tobacco use: Secondary | ICD-10-CM | POA: Insufficient documentation

## 2017-10-28 DIAGNOSIS — Z780 Asymptomatic menopausal state: Secondary | ICD-10-CM | POA: Insufficient documentation

## 2017-10-28 DIAGNOSIS — M5030 Other cervical disc degeneration, unspecified cervical region: Secondary | ICD-10-CM | POA: Insufficient documentation

## 2017-10-28 DIAGNOSIS — M542 Cervicalgia: Secondary | ICD-10-CM

## 2017-10-28 DIAGNOSIS — E669 Obesity, unspecified: Secondary | ICD-10-CM | POA: Insufficient documentation

## 2017-10-28 DIAGNOSIS — M79603 Pain in arm, unspecified: Secondary | ICD-10-CM | POA: Diagnosis not present

## 2017-10-28 DIAGNOSIS — Z800 Family history of malignant neoplasm of digestive organs: Secondary | ICD-10-CM | POA: Insufficient documentation

## 2017-10-28 DIAGNOSIS — Z888 Allergy status to other drugs, medicaments and biological substances status: Secondary | ICD-10-CM | POA: Insufficient documentation

## 2017-10-28 DIAGNOSIS — Z5181 Encounter for therapeutic drug level monitoring: Secondary | ICD-10-CM | POA: Diagnosis not present

## 2017-10-28 DIAGNOSIS — M5441 Lumbago with sciatica, right side: Secondary | ICD-10-CM | POA: Diagnosis not present

## 2017-10-28 DIAGNOSIS — G894 Chronic pain syndrome: Secondary | ICD-10-CM | POA: Diagnosis not present

## 2017-10-28 DIAGNOSIS — G8929 Other chronic pain: Secondary | ICD-10-CM

## 2017-10-28 MED ORDER — KETOROLAC TROMETHAMINE 60 MG/2ML IM SOLN
60.0000 mg | Freq: Once | INTRAMUSCULAR | Status: AC
  Administered 2017-10-28: 60 mg via INTRAMUSCULAR

## 2017-10-28 MED ORDER — OXYCODONE HCL 5 MG PO TABS: 5.0000 mg | ORAL_TABLET | Freq: Four times a day (QID) | ORAL | 0 refills | Status: DC | PRN

## 2017-10-28 MED ORDER — METHOCARBAMOL 750 MG PO TABS: 750.0000 mg | ORAL_TABLET | Freq: Three times a day (TID) | ORAL | 3 refills | Status: DC

## 2017-10-28 MED ORDER — ORPHENADRINE CITRATE 30 MG/ML IJ SOLN
INTRAMUSCULAR | Status: AC
  Filled 2017-10-28: qty 2

## 2017-10-28 MED ORDER — PREGABALIN 75 MG PO CAPS: 150.0000 mg | ORAL_CAPSULE | Freq: Two times a day (BID) | ORAL | 1 refills | Status: DC

## 2017-10-28 MED ORDER — ORPHENADRINE CITRATE 30 MG/ML IJ SOLN
60.0000 mg | Freq: Once | INTRAMUSCULAR | Status: AC
  Administered 2017-10-28: 60 mg via INTRAMUSCULAR

## 2017-10-28 MED ORDER — MELOXICAM 7.5 MG PO TABS: 7.5000 mg | ORAL_TABLET | Freq: Two times a day (BID) | ORAL | 2 refills | Status: DC

## 2017-10-28 MED ORDER — KETOROLAC TROMETHAMINE 60 MG/2ML IM SOLN
INTRAMUSCULAR | Status: AC
  Filled 2017-10-28: qty 2

## 2017-10-28 NOTE — Progress Notes (Signed)
Patient's Name: Hannah Maynard  MRN: [id number]  Referring Provider: Nathaneil Canary, [place]*  DOB: 12-14-1965  PCP: Nathaneil Canary, PA-C  DOS: 10/28/2017  Note by: Vevelyn Francois NP  Service setting: Ambulatory outpatient  Specialty: Interventional Pain Management  Location: [facility] (AMB) Pain Management Facility    Patient type: Established    Primary Reason(s) for Visit: Encounter for prescription drug management. (Level of risk: moderate)  CC: Back Pain (lower)  HPI  Hannah Maynard is a 52 y.o. year old, female patient, who comes today for a medication management evaluation. She has Lumbar spondylosis; Obesity; Tobacco use; Right-sided low back pain with right-sided sciatica; Hot flushes, perimenopausal; Climacteric; Chronic low back pain; Chronic upper extremity pain; Chronic pain syndrome; Chronic neck pain; Other long term (current) drug therapy; Other reduced mobility; Other specified health status; Chronic, continuous use of opioids; DDD (degenerative disc disease), cervical; Cervicalgia; and Long term current use of opiate analgesic on their problem list. Her primarily concern today is the Back Pain (lower)  Pain Assessment: Location: Lower Back Radiating: hips/buttocks bilateral down back of entire legs bilateral to ankle, sometime bottom of feet effects all toes bilateral Onset: More than a month ago Duration: Chronic pain Quality: Aching, Burning, Throbbing, Numbness("My toes feet thick") Severity:  9/10 (subjective, self-reported pain score)  Note: Reported level is compatible with observation. Clinically the patient looks like a 3/10 A 3/10 is viewed as "Moderate" and described as significantly interfering with activities of daily living (ADL). It becomes difficult to feed, bathe, get dressed, get on and off the toilet or to perform personal hygiene functions. Difficult to get in and out of bed or a chair without assistance. Very distracting. With effort, it can be ignored when  deeply involved in activities. Information on the proper use of the pain scale provided to the patient today. When using our objective Pain Scale, levels between 6 and 10/10 are said to belong in an emergency room, as it progressively worsens from a 6/10, described as severely limiting, requiring emergency care not usually available at an outpatient pain management facility. At a 6/10 level, communication becomes difficult and requires great effort. Assistance to reach the emergency department may be required. Facial flushing and profuse sweating along with potentially dangerous increases in heart rate and blood pressure will be evident. Effect on ADL: prolonged standing, walking, hard to do everyday chores Timing: Constant Modifying factors: medications BP: (!) 121/92  HR: 65  Hannah Maynard was last scheduled for an appointment on 08/27/2017 for medication management. During today's appointment we reviewed Hannah Maynard's chronic pain status, as well as her outpatient medication regimen. She is requesting additional medication .She admits that the leg and back pain are equal in intensity.  He admits that the right side is worse than the left.  She feels like her current medication is not effective.  She admits that she did have her dose reduction when she initially came here for treatment.  She admits that her neck pain is something she can do with back pain seems to be getting worse.  She would like to see what other options she has for the treatment of her lower back pain.  She admits that the interventional therapy is effective however only last about 3 weeks.  She admits that she is no longer working.  She denies overdoing her activities after her injections.  She just feels like she is too young to have to continue to deal with this pain. She admits  that when she was on tramadol along with the oxycodone this was more effective for pain.  Insurance would no longer approve the tramadol.  She admits that she needs  oxycodone 10 or 15 to help manage her pain.  She admits that the 7.5 were not effective and she did not like them.  The patient  reports that she does not use drugs. Her body mass index is 39.61 kg/m.  Further details on both, my assessment(s), as well as the proposed treatment plan, please see below.  Controlled Substance Pharmacotherapy Assessment REMS (Risk Evaluation and Mitigation Strategy)  Analgesic: Oxycodone 5 mg 4 times daily MME/day: 30 mg/day.  Ignatius Specking, RN  10/28/2017 10:39 AM  Sign at close encounter Nursing Pain Medication Assessment:  Safety precautions to be maintained throughout the outpatient stay will include: orient to surroundings, keep bed in low position, maintain call bell within reach at all times, provide assistance with transfer out of bed and ambulation.  Medication Inspection Compliance: Pill count conducted under aseptic conditions, in front of the patient. Neither the pills nor the bottle was removed from the patient's sight at any time. Once count was completed pills were immediately returned to the patient in their original bottle.  Medication: Oxycodone IR Pill/Patch Count: 30 of 120 pills remain Pill/Patch Appearance: Markings consistent with prescribed medication Bottle Appearance: Standard pharmacy container. Clearly labeled. Filled Date: 8 / 5 / 2019 Last Medication intake:  Today   Pharmacokinetics: Liberation and absorption (onset of action): WNL Distribution (time to peak effect): WNL Metabolism and excretion (duration of action): WNL         Pharmacodynamics: Desired effects: Analgesia: Hannah Maynard reports >50% benefit. Functional ability: Patient reports that medication allows her to accomplish basic ADLs Clinically meaningful improvement in function (CMIF): Sustained CMIF goals met Perceived effectiveness: Described as relatively effective, allowing for increase in activities of daily living (ADL) Undesirable effects: Side-effects  or Adverse reactions: None reported Monitoring: [place] PMP: Online review of the past 69-monthperiod conducted. Compliant with practice rules and regulations Last UDS on record: Summary  Date Value Ref Range Status  01/13/2017 FINAL  Final    Comment:    ==================================================================== TOXASSURE SELECT 13 (MW) ==================================================================== Test                             Result       Flag       Units Drug Present and Declared for Prescription Verification   Oxycodone                      159          EXPECTED   ng/mg creat   Oxymorphone                    25           EXPECTED   ng/mg creat   Noroxycodone                   168          EXPECTED   ng/mg creat    Sources of oxycodone include scheduled prescription medications.    Oxymorphone and noroxycodone are expected metabolites of    oxycodone. Oxymorphone is also available as a scheduled    prescription medication.   Tramadol  122          EXPECTED   ng/mg creat   O-Desmethyltramadol            40           EXPECTED   ng/mg creat    Source of tramadol is a prescription medication.    O-desmethyltramadol is an expected metabolite of tramadol. ==================================================================== Test                      Result    Flag   Units      Ref Range   Creatinine              251              mg/dL      >=20 ==================================================================== Declared Medications:  The flagging and interpretation on this report are based on the  following declared medications.  Unexpected results may arise from  inaccuracies in the declared medications.  **Note: The testing scope of this panel includes these medications:  Oxycodone  Tramadol  **Note: The testing scope of this panel does not include following  reported medications:  Ibuprofen  Methocarbamol  Paroxetine   Pregabalin ==================================================================== For clinical consultation, please call [phone]. ====================================================================    UDS interpretation: Compliant          Medication Assessment Form: Reviewed. Patient indicates being compliant with therapy Treatment compliance: Compliant Risk Assessment Profile: Aberrant behavior: See prior evaluations. None observed or detected today Comorbid factors increasing risk of overdose: See prior notes. No additional risks detected today Opioid risk tool (ORT) (Total Score): 3 Personal History of Substance Abuse (SUD-Substance use disorder):  Alcohol: Negative  Illegal Drugs: Negative  Rx Drugs: Negative  ORT Risk Level calculation: Low Risk Risk of substance use disorder (SUD): Low Opioid Risk Tool - 10/28/17 1034      Personal History of Substance Abuse   Alcohol  Negative    Illegal Drugs  Negative    Rx Drugs  Negative      Age   Age between 24-45 years   No      Psychological Disease   Psychological Disease  Positive    ADD  Negative    OCD  Negative    Bipolar  Negative    Schizophrenia  Negative    Depression  Positive   Aniety     Total Score   Opioid Risk Tool Scoring  3    Opioid Risk Interpretation  Low Risk      ORT Scoring interpretation table:  Score <3 = Low Risk for SUD  Score between 4-7 = Moderate Risk for SUD  Score >8 = High Risk for Opioid Abuse   Risk Mitigation Strategies:  Patient Counseling: Covered Patient-Prescriber Agreement (PPA): Present and active  Notification to other healthcare providers: Done  Pharmacologic Plan: No change in therapy, at this time.             Laboratory Chemistry  Inflammation Markers (CRP: Acute Phase) (ESR: Chronic Phase) Lab Results  Component Value Date   CRP 1.2 01/13/2017   ESRSEDRATE 10 01/13/2017                         Rheumatology Markers No results found for: RF, ANA,  LABURIC, URICUR, LYMEIGGIGMAB, LYMEABIGMQN, HLAB27                      Renal Function  Markers Lab Results  Component Value Date   BUN 15 01/13/2017   CREATININE 0.83 01/13/2017   BCR 18 01/13/2017   GFRAA 93 01/13/2017   GFRNONAA 81 01/13/2017                             Hepatic Function Markers Lab Results  Component Value Date   AST 39 01/13/2017   ALBUMIN 4.0 01/13/2017   ALKPHOS 117 01/13/2017                        Electrolytes Lab Results  Component Value Date   NA 143 01/13/2017   K 4.3 01/13/2017   CL 105 01/13/2017   CALCIUM 9.5 01/13/2017   MG 1.8 01/13/2017                        Neuropathy Markers No results found for: VITAMINB12, FOLATE, HGBA1C, HIV                      Bone Pathology Markers No results found for: VD25OH, GB201EO7HQR, FX5883GP4, DI2641RA3, 25OHVITD1, 25OHVITD2, 25OHVITD3, TESTOFREE, TESTOSTERONE                       Coagulation Parameters No results found for: INR, LABPROT, APTT, PLT, DDIMER                      Cardiovascular Markers No results found for: BNP, CKTOTAL, CKMB, TROPONINI, HGB, HCT                       CA Markers No results found for: CEA, CA125, LABCA2                      Note: Lab results reviewed.  Recent Diagnostic Imaging Results  DG C-Arm 1-60 Min-No Report Fluoroscopy was utilized by the requesting physician.  No radiographic  interpretation.   Complexity Note: Imaging results reviewed. Results shared with Hannah Maynard, using Layman's terms.                         Meds   Current Outpatient Medications:  .  ibuprofen (ADVIL,MOTRIN) 200 MG tablet, Take 200 mg by mouth every 6 (six) hours as needed., Disp: , Rfl:  .  meloxicam (MOBIC) 7.5 MG tablet, Take 1 tablet (7.5 mg total) by mouth 2 (two) times daily., Disp: 60 tablet, Rfl: 2 .  methocarbamol (ROBAXIN) 750 MG tablet, Take 1 tablet (750 mg total) by mouth 3 (three) times daily., Disp: 90 tablet, Rfl: 3 .  Nerve Stimulator (ZEWA TENS/EMS COMBO UNIT)  DEVI, by Does not apply route., Disp: , Rfl:  .  [START ON 11/28/2017] oxyCODONE (OXY IR/ROXICODONE) 5 MG immediate release tablet, Take 1 tablet (5 mg total) by mouth every 6 (six) hours as needed for severe pain., Disp: 120 tablet, Rfl: 0 .  pregabalin (LYRICA) 75 MG capsule, Take 2 capsules (150 mg total) by mouth 2 (two) times daily., Disp: 120 capsule, Rfl: 1 .  [START ON 10/29/2017] oxyCODONE (OXY IR/ROXICODONE) 5 MG immediate release tablet, Take 1 tablet (5 mg total) by mouth every 6 (six) hours as needed for severe pain., Disp: 120 tablet, Rfl: 0  Current Facility-Administered Medications:  .  lidocaine (PF) (XYLOCAINE) 1 % injection 5 mL, 5 mL,  Subcutaneous, Once, Molli Barrows, MD .  ropivacaine (PF) 2 mg/mL (0.2%) (NAROPIN) injection 10 mL, 10 mL, Epidural, Once, Adams, Alvina Filbert, MD  ROS  Constitutional: Denies any fever or chills Gastrointestinal: No reported hemesis, hematochezia, vomiting, or acute GI distress Musculoskeletal: Denies any acute onset joint swelling, redness, loss of ROM, or weakness Neurological: No reported episodes of acute onset apraxia, aphasia, dysarthria, agnosia, amnesia, paralysis, loss of coordination, or loss of consciousness  Allergies  Hannah Maynard is allergic to duloxetine and penicillins.  PFSH  Drug: Hannah Maynard  reports that she does not use drugs. Alcohol:  reports that she does not drink alcohol. Tobacco:  reports that she has been smoking cigarettes. She has never used smokeless tobacco. Medical:  has a past medical history of Anxiety, Chronic back pain, and Depression. Surgical: Hannah Maynard  has a past surgical history that includes Lipoma excision. Family: family history includes Breast cancer in her paternal aunt; Pancreatic cancer in her father; Stomach cancer in her maternal uncle.  Constitutional Exam  General appearance: Well nourished, well developed, and well hydrated. In no apparent acute distress Vitals:   10/28/17 1024  BP: (!)  121/92  Pulse: 65  Resp: 16  Temp: 98.2 F (36.8 C)  SpO2: 98%  Weight: 238 lb (108 kg)  Height: _0  (1.651 m)  Psych/Mental status: Alert, oriented x 3 (person, place, & time)       Eyes: PERLA Respiratory: No evidence of acute respiratory distress  Cervical Spine Area Exam  Skin & Axial Inspection: No masses, redness, edema, swelling, or associated skin lesions Alignment: Symmetrical Functional ROM: Unrestricted ROM      Stability: No instability detected Muscle Tone/Strength: Functionally intact. No obvious neuro-muscular anomalies detected. Sensory (Neurological): Unimpaired Palpation: No palpable anomalies              Upper Extremity (UE) Exam    Side: Right upper extremity  Side: Left upper extremity  Skin & Extremity Inspection: Skin color, temperature, and hair growth are WNL. No peripheral edema or cyanosis. No masses, redness, swelling, asymmetry, or associated skin lesions. No contractures.  Skin & Extremity Inspection: Skin color, temperature, and hair growth are WNL. No peripheral edema or cyanosis. No masses, redness, swelling, asymmetry, or associated skin lesions. No contractures.  Functional ROM: Unrestricted ROM          Functional ROM: Unrestricted ROM          Muscle Tone/Strength: Functionally intact. No obvious neuro-muscular anomalies detected.  Muscle Tone/Strength: Functionally intact. No obvious neuro-muscular anomalies detected.  Sensory (Neurological): Unimpaired          Sensory (Neurological): Unimpaired          Palpation: No palpable anomalies              Palpation: No palpable anomalies              Provocative Test(s):  Phalen's test: deferred Tinel's test: deferred Apley's scratch test (touch opposite shoulder):  Action 1 (Across chest): deferred Action 2 (Overhead): deferred Action 3 (LB reach): deferred   Provocative Test(s):  Phalen's test: deferred Tinel's test: deferred Apley's scratch test (touch opposite shoulder):  Action 1 (Across  chest): deferred Action 2 (Overhead): deferred Action 3 (LB reach): deferred    Thoracic Spine Area Exam  Skin & Axial Inspection: No masses, redness, or swelling Alignment: Symmetrical Functional ROM: Unrestricted ROM Stability: No instability detected Muscle Tone/Strength: Functionally intact. No obvious neuro-muscular anomalies detected. Sensory (Neurological): Unimpaired Muscle  strength & Tone: No palpable anomalies  Lumbar Spine Area Exam  Skin & Axial Inspection: No masses, redness, or swelling Alignment: Symmetrical Functional ROM: Unrestricted ROM       Stability: No instability detected Muscle Tone/Strength: Functionally intact. No obvious neuro-muscular anomalies detected. Sensory (Neurological): Unimpaired Palpation: Tender       Provocative Tests: Hyperextension/rotation test: deferred today       Lumbar quadrant test (Kemp's test): deferred today       Lateral bending test: deferred today       Patrick's Maneuver: deferred today                   FABER test: deferred today                   S-I anterior distraction/compression test: deferred today         S-I lateral compression test: deferred today         S-I Thigh-thrust test: deferred today         S-I Gaenslen's test: deferred today          Gait & Posture Assessment  Ambulation: Patient ambulates using a wheel chair   Lower Extremity Exam    Side: Right lower extremity  Side: Left lower extremity  Stability: No instability observed          Stability: No instability observed          Skin & Extremity Inspection: Skin color, temperature, and hair growth are WNL. No peripheral edema or cyanosis. No masses, redness, swelling, asymmetry, or associated skin lesions. No contractures.  Skin & Extremity Inspection: Skin color, temperature, and hair growth are WNL. No peripheral edema or cyanosis. No masses, redness, swelling, asymmetry, or associated skin lesions. No contractures.  Functional ROM: Unrestricted ROM                   Functional ROM: Unrestricted ROM                  Muscle Tone/Strength: Functionally intact. No obvious neuro-muscular anomalies detected.  Muscle Tone/Strength: Functionally intact. No obvious neuro-muscular anomalies detected.  Sensory (Neurological): Unimpaired  Sensory (Neurological): Unimpaired  Palpation: No palpable anomalies  Palpation: No palpable anomalies   Assessment  Primary Diagnosis & Pertinent Problem List: The primary encounter diagnosis was Lumbar spondylosis. Diagnoses of Chronic right-sided low back pain with right-sided sciatica, Cervicalgia, Chronic pain syndrome, and Long term current use of opiate analgesic were also pertinent to this visit.  Status Diagnosis  Worsening Worsening Controlled 1. Lumbar spondylosis   2. Chronic right-sided low back pain with right-sided sciatica   3. Cervicalgia   4. Chronic pain syndrome   5. Long term current use of opiate analgesic     Problems updated and reviewed during this visit: Problem  Long Term Current Use of Opiate Analgesic   Plan of Care  Pharmacotherapy (Medications Ordered): Meds ordered this encounter  Medications  . meloxicam (MOBIC) 7.5 MG tablet    Sig: Take 1 tablet (7.5 mg total) by mouth 2 (two) times daily.    Dispense:  60 tablet    Refill:  2    Order Specific Question:   Supervising Provider    Answer:   Molli Barrows [[id number]]  . methocarbamol (ROBAXIN) 750 MG tablet    Sig: Take 1 tablet (750 mg total) by mouth 3 (three) times daily.    Dispense:  90 tablet    Refill:  3  Order Specific Question:   Supervising Provider    Answer:   Molli Barrows [[id number]]  . pregabalin (LYRICA) 75 MG capsule    Sig: Take 2 capsules (150 mg total) by mouth 2 (two) times daily.    Dispense:  120 capsule    Refill:  1    Order Specific Question:   Supervising Provider    Answer:   Molli Barrows [[id number]]  . orphenadrine (NORFLEX) injection 60 mg  . ketorolac (TORADOL) injection 60 mg  .  oxyCODONE (OXY IR/ROXICODONE) 5 MG immediate release tablet    Sig: Take 1 tablet (5 mg total) by mouth every 6 (six) hours as needed for severe pain.    Dispense:  120 tablet    Refill:  0    Do not fill until:11/28/2017    Order Specific Question:   Supervising Provider    Answer:   Milinda Pointer [phone]  . oxyCODONE (OXY IR/ROXICODONE) 5 MG immediate release tablet    Sig: Take 1 tablet (5 mg total) by mouth every 6 (six) hours as needed for severe pain.    Dispense:  120 tablet    Refill:  0    Do not fill until 10/29/2017    Order Specific Question:   Supervising Provider    Answer:   Milinda Pointer [phone]   New Prescriptions   No medications on file   Medications administered today: We administered orphenadrine and ketorolac. Lab-work, procedure(s), and/or referral(s): Orders Placed This Encounter  Procedures  . ToxASSURE Select 13 (MW), Urine  . Comp. Metabolic Panel (12)  . Ambulatory referral to Neurosurgery   Imaging and/or referral(s): AMB REFERRAL TO NEUROSURGERY  Interventional therapies: Planned, scheduled, and/or pending:   Bilateral LESI with sedation ASAP    Provider-requested follow-up: Return in about 2 months (around 12/28/2017) for MedMgmt with Me Dionisio David), in addition, Procedure(w/Sedation), w/ Dr. Andree Elk.  Future Appointments  Date Time Provider [place]  11/03/2017  2:45 PM Molli Barrows, MD [facility] None  01/26/2018 10:30 AM Vevelyn Francois, NP [facility] None   Primary Care Physician: Gunnar Bulla Location: [facility] Outpatient Pain Management Facility Note by: Vevelyn Francois NP Date: 10/28/2017; Time: 12:04 PM  Pain Score Disclaimer: We use the NRS-11 scale. This is a self-reported, subjective measurement of pain severity with only modest accuracy. It is used primarily to identify changes within a particular patient. It must be understood that outpatient pain scales are significantly less accurate that those used  for research, where they can be applied under ideal controlled circumstances with minimal exposure to variables. In reality, the score is likely to be a combination of pain intensity and pain affect, where pain affect describes the degree of emotional arousal or changes in action readiness caused by the sensory experience of pain. Factors such as social and work situation, setting, emotional state, anxiety levels, expectation, and prior pain experience may influence pain perception and show large inter-individual differences that may also be affected by time variables.  Patient instructions provided during this appointment: Patient Instructions   ____________________________________________________________________________________________  Pain Scale  Introduction: The pain score used by this practice is the Verbal Numerical Rating Scale (VNRS-11). This is an 11-point scale. It is for adults and children 10 years or older. There are significant differences in how the pain score is reported, used, and applied. Forget everything you learned in the past and learn this scoring system.  General Information: The scale should reflect your current level of  pain. Unless you are specifically asked for the level of your worst pain, or your average pain. If you are asked for one of these two, then it should be understood that it is over the past 24 hours.  Basic Activities of Daily Living (ADL): Personal hygiene, dressing, eating, transferring, and using restroom.  Instructions: Most patients tend to report their level of pain as a combination of two factors, their physical pain and their psychosocial pain. This last one is also known as "suffering" and it is reflection of how physical pain affects you socially and psychologically. From now on, report them separately. From this point on, when asked to report your pain level, report only your physical pain. Use the following table for reference.  Pain Clinic Pain  Levels (0-5/10)  Pain Level Score  Description  No Pain 0   Mild pain 1 Nagging, annoying, but does not interfere with basic activities of daily living (ADL). Patients are able to eat, bathe, get dressed, toileting (being able to get on and off the toilet and perform personal hygiene functions), transfer (move in and out of bed or a chair without assistance), and maintain continence (able to control bladder and bowel functions). Blood pressure and heart rate are unaffected. A normal heart rate for a healthy adult ranges from 60 to 100 bpm (beats per minute).   Mild to moderate pain 2 Noticeable and distracting. Impossible to hide from other people. More frequent flare-ups. Still possible to adapt and function close to normal. It can be very annoying and may have occasional stronger flare-ups. With discipline, patients may get used to it and adapt.   Moderate pain 3 Interferes significantly with activities of daily living (ADL). It becomes difficult to feed, bathe, get dressed, get on and off the toilet or to perform personal hygiene functions. Difficult to get in and out of bed or a chair without assistance. Very distracting. With effort, it can be ignored when deeply involved in activities.   Moderately severe pain 4 Impossible to ignore for more than a few minutes. With effort, patients may still be able to manage work or participate in some social activities. Very difficult to concentrate. Signs of autonomic nervous system discharge are evident: dilated pupils (mydriasis); mild sweating (diaphoresis); sleep interference. Heart rate becomes elevated (>115 bpm). Diastolic blood pressure (lower number) rises above 100 mmHg. Patients find relief in laying down and not moving.   Severe pain 5 Intense and extremely unpleasant. Associated with frowning face and frequent crying. Pain overwhelms the senses.  Ability to do any activity or maintain social relationships becomes significantly limited. Conversation  becomes difficult. Pacing back and forth is common, as getting into a comfortable position is nearly impossible. Pain wakes you up from deep sleep. Physical signs will be obvious: pupillary dilation; increased sweating; goosebumps; brisk reflexes; cold, clammy hands and feet; nausea, vomiting or dry heaves; loss of appetite; significant sleep disturbance with inability to fall asleep or to remain asleep. When persistent, significant weight loss is observed due to the complete loss of appetite and sleep deprivation.  Blood pressure and heart rate becomes significantly elevated. Caution: If elevated blood pressure triggers a pounding headache associated with blurred vision, then the patient should immediately seek attention at an urgent or emergency care unit, as these may be signs of an impending stroke.    Emergency Department Pain Levels (6-10/10)  Emergency Room Pain 6 Severely limiting. Requires emergency care and should not be seen or managed at an outpatient  pain management facility. Communication becomes difficult and requires great effort. Assistance to reach the emergency department may be required. Facial flushing and profuse sweating along with potentially dangerous increases in heart rate and blood pressure will be evident.   Distressing pain 7 Self-care is very difficult. Assistance is required to transport, or use restroom. Assistance to reach the emergency department will be required. Tasks requiring coordination, such as bathing and getting dressed become very difficult.   Disabling pain 8 Self-care is no longer possible. At this level, pain is disabling. The individual is unable to do even the most "basic" activities such as walking, eating, bathing, dressing, transferring to a bed, or toileting. Fine motor skills are lost. It is difficult to think clearly.   Incapacitating pain 9 Pain becomes incapacitating. Thought processing is no longer possible. Difficult to remember your own name.  Control of movement and coordination are lost.   The worst pain imaginable 10 At this level, most patients pass out from pain. When this level is reached, collapse of the autonomic nervous system occurs, leading to a sudden drop in blood pressure and heart rate. This in turn results in a temporary and dramatic drop in blood flow to the brain, leading to a loss of consciousness. Fainting is one of the body's self defense mechanisms. Passing out puts the brain in a calmed state and causes it to shut down for a while, in order to begin the healing process.    Summary: 1. Refer to this scale when providing [place] with your pain level. 2. Be accurate and careful when reporting your pain level. This will help with your care. 3. Over-reporting your pain level will lead to loss of credibility. 4. Even a level of 1/10 means that there is pain and will be treated at our facility. 5. High, inaccurate reporting will be documented as "Symptom Exaggeration", leading to loss of credibility and suspicions of possible secondary gains such as obtaining more narcotics, or wanting to appear disabled, for fraudulent reasons. 6. Only pain levels of 5 or below will be seen at our facility. 7. Pain levels of 6 and above will be sent to the Emergency Department and the appointment cancelled. ____________________________________________________________________________________________   ____________________________________________________________________________________________  Medication Rules  Applies to: All patients receiving prescriptions (written or electronic).  Pharmacy of record: Pharmacy where electronic prescriptions will be sent. If written prescriptions are taken to a different pharmacy, please inform the nursing staff. The pharmacy listed in the electronic medical record should be the one where you would like electronic prescriptions to be sent.  Prescription refills: Only during scheduled appointments.  Applies to both, written and electronic prescriptions.  NOTE: The following applies primarily to controlled substances (Opioid* Pain Medications).   Patient's responsibilities: 1. Pain Pills: Bring all pain pills to every appointment (except for procedure appointments). 2. Pill Bottles: Bring pills in original pharmacy bottle. Always bring newest bottle. Bring bottle, even if empty. 3. Medication refills: You are responsible for knowing and keeping track of what medications you need refilled. The day before your appointment, write a list of all prescriptions that need to be refilled. Bring that list to your appointment and give it to the admitting nurse. Prescriptions will be written only during appointments. If you forget a medication, it will not be "Called in", "Faxed", or "electronically sent". You will need to get another appointment to get these prescribed. 4. Prescription Accuracy: You are responsible for carefully inspecting your prescriptions before leaving our office. Have the discharge nurse  carefully go over each prescription with you, before taking them home. Make sure that your name is accurately spelled, that your address is correct. Check the name and dose of your medication to make sure it is accurate. Check the number of pills, and the written instructions to make sure they are clear and accurate. Make sure that you are given enough medication to last until your next medication refill appointment. 5. Taking Medication: Take medication as prescribed. Never take more pills than instructed. Never take medication more frequently than prescribed. Taking less pills or less frequently is permitted and encouraged, when it comes to controlled substances (written prescriptions).  6. Inform other Doctors: Always inform, all of your healthcare providers, of all the medications you take. 7. Pain Medication from other Providers: You are not allowed to accept any additional pain medication from any  other Doctor or Healthcare provider. There are two exceptions to this rule. (see below) In the event that you require additional pain medication, you are responsible for notifying us, as stated below. 8. Medication Agreement: You are responsible for carefully reading and following our Medication Agreement. This must be signed before receiving any prescriptions from our practice. Safely store a copy of your signed Agreement. Violations to the Agreement will result in no further prescriptions. (Additional copies of our Medication Agreement are available upon request.) 9. Laws, Rules, & Regulations: All patients are expected to follow all Federal and [employer], [employer], Rules, [employer] [facility]. Ignorance of the Laws does not constitute a valid excuse. The use of any illegal substances is prohibited. 10. Adopted CDC guidelines & recommendations: Target dosing levels will be at or below 60 MME/day. Use of benzodiazepines** is not recommended.  Exceptions: There are only two exceptions to the rule of not receiving pain medications from other Healthcare Providers. 1. Exception #1 (Emergencies): In the event of an emergency (i.e.: accident requiring emergency care), you are allowed to receive additional pain medication. However, you are responsible for: As soon as you are able, call our office [phone], at any time of the day or night, and leave a message stating your name, the date and nature of the emergency, and the name and dose of the medication prescribed. In the event that your call is answered by a member of our staff, make sure to document and save the date, time, and the name of the person that took your information.  2. Exception #2 (Planned Surgery): In the event that you are scheduled by another doctor or dentist to have any type of surgery or procedure, you are allowed (for a period no longer than 30 days), to receive additional pain medication, for the acute post-op pain. However, in this case,  you are responsible for picking up a copy of our "Post-op Pain Management for Surgeons" handout, and giving it to your surgeon or dentist. This document is available at our office, and does not require an appointment to obtain it. Simply go to our office during business hours (Monday-Thursday from 8:00 AM to 4:00 PM) (Friday 8:00 AM to 12:00 Noon) or if you have a scheduled appointment with [place], prior to your surgery, and ask for it by name. In addition, you will need to provide [place] with your name, name of your surgeon, type of surgery, and date of procedure or surgery.  *Opioid medications include: morphine, codeine, oxycodone, oxymorphone, hydrocodone, hydromorphone, meperidine, tramadol, tapentadol, buprenorphine, fentanyl, methadone. **Benzodiazepine medications include: diazepam (Valium), alprazolam (Xanax), clonazepam (Klonopine), lorazepam (Ativan), clorazepate (Tranxene), chlordiazepoxide (Librium),  estazolam (Prosom), oxazepam (Serax), temazepam (Restoril), triazolam (Halcion) (Last updated: 04/24/2017) ____________________________________________________________________________________________    BMI Assessment: Estimated body mass index is 39.61 kg/m as calculated from the following:   Height as of this encounter: _0  (1.651 m).   Weight as of this encounter: 238 lb (108 kg).  BMI interpretation table: BMI level Category Range association with higher incidence of chronic pain  <18 kg/m2 Underweight   18.5-24.9 kg/m2 Ideal body weight   25-29.9 kg/m2 Overweight Increased incidence by 20%  30-34.9 kg/m2 Obese (Class I) Increased incidence by 68%  35-39.9 kg/m2 Severe obesity (Class II) Increased incidence by 136%  >40 kg/m2 Extreme obesity (Class III) Increased incidence by 254%   Patient's current BMI Ideal Body weight  Body mass index is 39.61 kg/m. Ideal body weight: 57 kg (125 lb 10.6 oz) Adjusted ideal body weight: 77.4 kg (170 lb 9.6 oz)   BMI Readings from Last 4  Encounters:  10/28/17 39.61 kg/m  08/27/17 40.20 kg/m  08/05/17 38.27 kg/m  06/19/17 38.94 kg/m   Wt Readings from Last 4 Encounters:  10/28/17 238 lb (108 kg)  08/27/17 241 lb 9.6 oz (109.6 kg)  08/05/17 230 lb (104.3 kg)  06/19/17 234 lb (106.1 kg)  ____________________________________________________________________________________________  Preparing for Procedure with Sedation  Instructions: . Oral Intake: Do not eat or drink anything for at least 8 hours prior to your procedure. . Transportation: Public transportation is not allowed. Bring an adult driver. The driver must be physically present in our waiting room before any procedure can be started. Marland Kitchen Physical Assistance: Bring an adult physically capable of assisting you, in the event you need help. This adult should keep you company at home for at least 6 hours after the procedure. . Blood Pressure Medicine: Take your blood pressure medicine with a sip of water the morning of the procedure. . Blood thinners: Notify our staff if you are taking any blood thinners. Depending on which one you take, there will be specific instructions on how and when to stop it. . Diabetics on insulin: Notify the staff so that you can be scheduled 1st case in the morning. If your diabetes requires high dose insulin, take only  of your normal insulin dose the morning of the procedure and notify the staff that you have done so. . Preventing infections: Shower with an antibacterial soap the morning of your procedure. . Build-up your immune system: Take 1000 mg of Vitamin C with every meal (3 times a day) the day prior to your procedure. Marland Kitchen Antibiotics: Inform the staff if you have a condition or reason that requires you to take antibiotics before dental procedures. . Pregnancy: If you are pregnant, call and cancel the procedure. . Sickness: If you have a cold, fever, or any active infections, call and cancel the procedure. . Arrival: You must be in the  facility at least 30 minutes prior to your scheduled procedure. . Children: Do not bring children with you. . Dress appropriately: Bring dark clothing that you would not mind if they get stained. . Valuables: Do not bring any jewelry or valuables.  Procedure appointments are reserved for interventional treatments only. Marland Kitchen No Prescription Refills. . No medication changes will be discussed during procedure appointments. . No disability issues will be discussed.  Reasons to call and reschedule or cancel your procedure: (Following these recommendations will minimize the risk of a serious complication.) . Surgeries: Avoid having procedures within 2 weeks of any surgery. (Avoid for 2 weeks before or  after any surgery). . Flu Shots: Avoid having procedures within 2 weeks of a flu shots or . (Avoid for 2 weeks before or after immunizations). . Barium: Avoid having a procedure within 7-10 days after having had a radiological study involving the use of radiological contrast. (Myelograms, Barium swallow or enema study). . Heart attacks: Avoid any elective procedures or surgeries for the initial 6 months after a "Myocardial Infarction" (Heart Attack). . Blood thinners: It is imperative that you stop these medications before procedures. Let us know if you if you take any blood thinner.  . Infection: Avoid procedures during or within two weeks of an infection (including chest colds or gastrointestinal problems). Symptoms associated with infections include: Localized redness, fever, chills, night sweats or profuse sweating, burning sensation when voiding, cough, congestion, stuffiness, runny nose, sore throat, diarrhea, nausea, vomiting, cold or Flu symptoms, recent or current infections. It is specially important if the infection is over the area that we intend to treat. Marland Kitchen Heart and lung problems: Symptoms that may suggest an active cardiopulmonary problem include: cough, chest pain, breathing difficulties or  shortness of breath, dizziness, ankle swelling, uncontrolled high or unusually low blood pressure, and/or palpitations. If you are experiencing any of these symptoms, cancel your procedure and contact your primary care physician for an evaluation.  Remember:  Regular Business hours are:  Monday to Thursday 8:00 AM to 4:00 PM  Provider's Schedule: Milinda Pointer, MD:  Procedure days: Tuesday and Thursday 7:30 AM to 4:00 PM  Gillis Santa, MD:  Procedure days: Monday and Wednesday 7:30 AM to 4:00 PM ____________________________________________________________________________________________   Epidural Steroid Injection An epidural steroid injection is a shot of steroid medicine and numbing medicine that is given into the space between the spinal cord and the bones in your back (epidural space). The shot helps relieve pain caused by an irritated or swollen nerve root. The amount of pain relief you get from the injection depends on what is causing the nerve to be swollen and irritated, and how long your pain lasts. You are more likely to benefit from this injection if your pain is strong and comes on suddenly rather than if you have had pain for a long time. Tell a health care provider about:  Any allergies you have.  All medicines you are taking, including vitamins, herbs, eye drops, creams, and over-the-counter medicines.  Any problems you or family members have had with anesthetic medicines.  Any blood disorders you have.  Any surgeries you have had.  Any medical conditions you have.  Whether you are pregnant or may be pregnant. What are the risks? Generally, this is a safe procedure. However, problems may occur, including:  Headache.  Bleeding.  Infection.  Allergic reaction to medicines.  Damage to your nerves.  What happens before the procedure? Staying hydrated Follow instructions from your health care provider about hydration, which may include:  Up to 2 hours  before the procedure - you may continue to drink clear liquids, such as water, clear fruit juice, black coffee, and plain tea.  Eating and drinking restrictions Follow instructions from your health care provider about eating and drinking, which may include:  8 hours before the procedure - stop eating heavy meals or foods such as meat, fried foods, or fatty foods.  6 hours before the procedure - stop eating light meals or foods, such as toast or cereal.  6 hours before the procedure - stop drinking milk or drinks that contain milk.  2 hours before  the procedure - stop drinking clear liquids.  Medicine  You may be given medicines to lower anxiety.  Ask your health care provider about: ? Changing or stopping your regular medicines. This is especially important if you are taking diabetes medicines or blood thinners. ? Taking medicines such as aspirin and ibuprofen. These medicines can thin your blood. Do not take these medicines before your procedure if your health care provider instructs you not to. General instructions  Plan to have someone take you home from the hospital or clinic. What happens during the procedure?  You may receive a medicine to help you relax (sedative).  You will be asked to lie on your abdomen.  The injection site will be cleaned.  A numbing medicine (local anesthetic) will be used to numb the injection site.  A needle will be inserted through your skin into the epidural space. You may feel some discomfort when this happens. An X-ray machine will be used to make sure the needle is put as close as possible to the affected nerve.  A steroid medicine and a local anesthetic will be injected into the epidural space.  The needle will be removed.  A bandage (dressing) will be put over the injection site. What happens after the procedure?  Your blood pressure, heart rate, breathing rate, and blood oxygen level will be monitored until the medicines you were given  have worn off.  Your arm or leg may feel weak or numb for a few hours.  The injection site may feel sore.  Do not drive for 24 hours if you received a sedative. This information is not intended to replace advice given to you by your health care provider. Make sure you discuss any questions you have with your health care provider. Document Released: 05/21/2007 Document Revised: 07/26/2015 Document Reviewed: 05/30/2015 Elsevier Interactive Patient Education  [employer].

## 2017-10-28 NOTE — Progress Notes (Signed)
Nursing Pain Medication Assessment:  Safety precautions to be maintained throughout the outpatient stay will include: orient to surroundings, keep bed in low position, maintain call bell within reach at all times, provide assistance with transfer out of bed and ambulation.  Medication Inspection Compliance: Pill count conducted under aseptic conditions, in front of the patient. Neither the pills nor the bottle was removed from the patient's sight at any time. Once count was completed pills were immediately returned to the patient in their original bottle.  Medication: Oxycodone IR Pill/Patch Count: 30 of 120 pills remain Pill/Patch Appearance: Markings consistent with prescribed medication Bottle Appearance: Standard pharmacy container. Clearly labeled. Filled Date: 8 / 5 / 2019 Last Medication intake:  Today

## 2017-10-28 NOTE — Patient Instructions (Addendum)
____________________________________________________________________________________________  Pain Scale  Introduction: The pain score used by this practice is the Verbal Numerical Rating Scale (VNRS-11). This is an 11-point scale. It is for adults and children 10 years or older. There are significant differences in how the pain score is reported, used, and applied. Forget everything you learned in the past and learn this scoring system.  General Information: The scale should reflect your current level of pain. Unless you are specifically asked for the level of your worst pain, or your average pain. If you are asked for one of these two, then it should be understood that it is over the past 24 hours.  Basic Activities of Daily Living (ADL): Personal hygiene, dressing, eating, transferring, and using restroom.  Instructions: Most patients tend to report their level of pain as a combination of two factors, their physical pain and their psychosocial pain. This last one is also known as "suffering" and it is reflection of how physical pain affects you socially and psychologically. From now on, report them separately. From this point on, when asked to report your pain level, report only your physical pain. Use the following table for reference.  Pain Clinic Pain Levels (0-5/10)  Pain Level Score  Description  No Pain 0   Mild pain 1 Nagging, annoying, but does not interfere with basic activities of daily living (ADL). Patients are able to eat, bathe, get dressed, toileting (being able to get on and off the toilet and perform personal hygiene functions), transfer (move in and out of bed or a chair without assistance), and maintain continence (able to control bladder and bowel functions). Blood pressure and heart rate are unaffected. A normal heart rate for a healthy adult ranges from 60 to 100 bpm (beats per minute).   Mild to moderate pain 2 Noticeable and distracting. Impossible to hide from other  people. More frequent flare-ups. Still possible to adapt and function close to normal. It can be very annoying and may have occasional stronger flare-ups. With discipline, patients may get used to it and adapt.   Moderate pain 3 Interferes significantly with activities of daily living (ADL). It becomes difficult to feed, bathe, get dressed, get on and off the toilet or to perform personal hygiene functions. Difficult to get in and out of bed or a chair without assistance. Very distracting. With effort, it can be ignored when deeply involved in activities.   Moderately severe pain 4 Impossible to ignore for more than a few minutes. With effort, patients may still be able to manage work or participate in some social activities. Very difficult to concentrate. Signs of autonomic nervous system discharge are evident: dilated pupils (mydriasis); mild sweating (diaphoresis); sleep interference. Heart rate becomes elevated (>115 bpm). Diastolic blood pressure (lower number) rises above 100 mmHg. Patients find relief in laying down and not moving.   Severe pain 5 Intense and extremely unpleasant. Associated with frowning face and frequent crying. Pain overwhelms the senses.  Ability to do any activity or maintain social relationships becomes significantly limited. Conversation becomes difficult. Pacing back and forth is common, as getting into a comfortable position is nearly impossible. Pain wakes you up from deep sleep. Physical signs will be obvious: pupillary dilation; increased sweating; goosebumps; brisk reflexes; cold, clammy hands and feet; nausea, vomiting or dry heaves; loss of appetite; significant sleep disturbance with inability to fall asleep or to remain asleep. When persistent, significant weight loss is observed due to the complete loss of appetite and sleep deprivation.  Blood   pressure and heart rate becomes significantly elevated. Caution: If elevated blood pressure triggers a pounding headache  associated with blurred vision, then the patient should immediately seek attention at an urgent or emergency care unit, as these may be signs of an impending stroke.    Emergency Department Pain Levels (6-10/10)  Emergency Room Pain 6 Severely limiting. Requires emergency care and should not be seen or managed at an outpatient pain management facility. Communication becomes difficult and requires great effort. Assistance to reach the emergency department may be required. Facial flushing and profuse sweating along with potentially dangerous increases in heart rate and blood pressure will be evident.   Distressing pain 7 Self-care is very difficult. Assistance is required to transport, or use restroom. Assistance to reach the emergency department will be required. Tasks requiring coordination, such as bathing and getting dressed become very difficult.   Disabling pain 8 Self-care is no longer possible. At this level, pain is disabling. The individual is unable to do even the most "basic" activities such as walking, eating, bathing, dressing, transferring to a bed, or toileting. Fine motor skills are lost. It is difficult to think clearly.   Incapacitating pain 9 Pain becomes incapacitating. Thought processing is no longer possible. Difficult to remember your own name. Control of movement and coordination are lost.   The worst pain imaginable 10 At this level, most patients pass out from pain. When this level is reached, collapse of the autonomic nervous system occurs, leading to a sudden drop in blood pressure and heart rate. This in turn results in a temporary and dramatic drop in blood flow to the brain, leading to a loss of consciousness. Fainting is one of the body's self defense mechanisms. Passing out puts the brain in a calmed state and causes it to shut down for a while, in order to begin the healing process.    Summary: 1. Refer to this scale when providing [place] with your pain level. 2. Be  accurate and careful when reporting your pain level. This will help with your care. 3. Over-reporting your pain level will lead to loss of credibility. 4. Even a level of 1/10 means that there is pain and will be treated at our facility. 5. High, inaccurate reporting will be documented as "Symptom Exaggeration", leading to loss of credibility and suspicions of possible secondary gains such as obtaining more narcotics, or wanting to appear disabled, for fraudulent reasons. 6. Only pain levels of 5 or below will be seen at our facility. 7. Pain levels of 6 and above will be sent to the Emergency Department and the appointment cancelled. ____________________________________________________________________________________________   ____________________________________________________________________________________________  Medication Rules  Applies to: All patients receiving prescriptions (written or electronic).  Pharmacy of record: Pharmacy where electronic prescriptions will be sent. If written prescriptions are taken to a different pharmacy, please inform the nursing staff. The pharmacy listed in the electronic medical record should be the one where you would like electronic prescriptions to be sent.  Prescription refills: Only during scheduled appointments. Applies to both, written and electronic prescriptions.  NOTE: The following applies primarily to controlled substances (Opioid* Pain Medications).   Patient's responsibilities: 1. Pain Pills: Bring all pain pills to every appointment (except for procedure appointments). 2. Pill Bottles: Bring pills in original pharmacy bottle. Always bring newest bottle. Bring bottle, even if empty. 3. Medication refills: You are responsible for knowing and keeping track of what medications you need refilled. The day before your appointment, write a list of all  prescriptions that need to be refilled. Bring that list to your appointment and give it to the  admitting nurse. Prescriptions will be written only during appointments. If you forget a medication, it will not be "Called in", "Faxed", or "electronically sent". You will need to get another appointment to get these prescribed. 4. Prescription Accuracy: You are responsible for carefully inspecting your prescriptions before leaving our office. Have the discharge nurse carefully go over each prescription with you, before taking them home. Make sure that your name is accurately spelled, that your address is correct. Check the name and dose of your medication to make sure it is accurate. Check the number of pills, and the written instructions to make sure they are clear and accurate. Make sure that you are given enough medication to last until your next medication refill appointment. 5. Taking Medication: Take medication as prescribed. Never take more pills than instructed. Never take medication more frequently than prescribed. Taking less pills or less frequently is permitted and encouraged, when it comes to controlled substances (written prescriptions).  6. Inform other Doctors: Always inform, all of your healthcare providers, of all the medications you take. 7. Pain Medication from other Providers: You are not allowed to accept any additional pain medication from any other Doctor or Healthcare provider. There are two exceptions to this rule. (see below) In the event that you require additional pain medication, you are responsible for notifying us, as stated below. 8. Medication Agreement: You are responsible for carefully reading and following our Medication Agreement. This must be signed before receiving any prescriptions from our practice. Safely store a copy of your signed Agreement. Violations to the Agreement will result in no further prescriptions. (Additional copies of our Medication Agreement are available upon request.) 9. Laws, Rules, & Regulations: All patients are expected to follow all Federal  and [employer], Rules, [employer]. Ignorance of the Laws does not constitute a valid excuse. The use of any illegal substances is prohibited. 10. Adopted CDC guidelines & recommendations: Target dosing levels will be at or below 60 MME/day. Use of benzodiazepines** is not recommended.  Exceptions: There are only two exceptions to the rule of not receiving pain medications from other Healthcare Providers. 1. Exception #1 (Emergencies): In the event of an emergency (i.e.: accident requiring emergency care), you are allowed to receive additional pain medication. However, you are responsible for: As soon as you are able, call our office [phone], at any time of the day or night, and leave a message stating your name, the date and nature of the emergency, and the name and dose of the medication prescribed. In the event that your call is answered by a member of our staff, make sure to document and save the date, time, and the name of the person that took your information.  2. Exception #2 (Planned Surgery): In the event that you are scheduled by another doctor or dentist to have any type of surgery or procedure, you are allowed (for a period no longer than 30 days), to receive additional pain medication, for the acute post-op pain. However, in this case, you are responsible for picking up a copy of our "Post-op Pain Management for Surgeons" handout, and giving it to your surgeon or dentist. This document is available at our office, and does not require an appointment to obtain it. Simply go to our office during business hours (Monday-Thursday from 8:00 AM to 4:00 PM) (Friday 8:00 AM to 12:00 Noon) or if you  have a scheduled appointment with [place], prior to your surgery, and ask for it by name. In addition, you will need to provide [place] with your name, name of your surgeon, type of surgery, and date of procedure or surgery.  *Opioid medications include: morphine, codeine, oxycodone, oxymorphone,  hydrocodone, hydromorphone, meperidine, tramadol, tapentadol, buprenorphine, fentanyl, methadone. **Benzodiazepine medications include: diazepam (Valium), alprazolam (Xanax), clonazepam (Klonopine), lorazepam (Ativan), clorazepate (Tranxene), chlordiazepoxide (Librium), estazolam (Prosom), oxazepam (Serax), temazepam (Restoril), triazolam (Halcion) (Last updated: 04/24/2017) ____________________________________________________________________________________________    BMI Assessment: Estimated body mass index is 39.61 kg/m as calculated from the following:   Height as of this encounter: 5\' 5"  (1.651 m).   Weight as of this encounter: 238 lb (108 kg).  BMI interpretation table: BMI level Category Range association with higher incidence of chronic pain  <18 kg/m2 Underweight   18.5-24.9 kg/m2 Ideal body weight   25-29.9 kg/m2 Overweight Increased incidence by 20%  30-34.9 kg/m2 Obese (Class I) Increased incidence by 68%  35-39.9 kg/m2 Severe obesity (Class II) Increased incidence by 136%  >40 kg/m2 Extreme obesity (Class III) Increased incidence by 254%   Patient's current BMI Ideal Body weight  Body mass index is 39.61 kg/m. Ideal body weight: 57 kg (125 lb 10.6 oz) Adjusted ideal body weight: 77.4 kg (170 lb 9.6 oz)   BMI Readings from Last 4 Encounters:  10/28/17 39.61 kg/m  08/27/17 40.20 kg/m  08/05/17 38.27 kg/m  06/19/17 38.94 kg/m   Wt Readings from Last 4 Encounters:  10/28/17 238 lb (108 kg)  08/27/17 241 lb 9.6 oz (109.6 kg)  08/05/17 230 lb (104.3 kg)  06/19/17 234 lb (106.1 kg)  ____________________________________________________________________________________________  Preparing for Procedure with Sedation  Instructions: . Oral Intake: Do not eat or drink anything for at least 8 hours prior to your procedure. . Transportation: Public transportation is not allowed. Bring an adult driver. The driver must be physically present in our waiting room before any  procedure can be started. Marland Kitchen Physical Assistance: Bring an adult physically capable of assisting you, in the event you need help. This adult should keep you company at home for at least 6 hours after the procedure. . Blood Pressure Medicine: Take your blood pressure medicine with a sip of water the morning of the procedure. . Blood thinners: Notify our staff if you are taking any blood thinners. Depending on which one you take, there will be specific instructions on how and when to stop it. . Diabetics on insulin: Notify the staff so that you can be scheduled 1st case in the morning. If your diabetes requires high dose insulin, take only  of your normal insulin dose the morning of the procedure and notify the staff that you have done so. . Preventing infections: Shower with an antibacterial soap the morning of your procedure. . Build-up your immune system: Take 1000 mg of Vitamin C with every meal (3 times a day) the day prior to your procedure. Marland Kitchen Antibiotics: Inform the staff if you have a condition or reason that requires you to take antibiotics before dental procedures. . Pregnancy: If you are pregnant, call and cancel the procedure. . Sickness: If you have a cold, fever, or any active infections, call and cancel the procedure. . Arrival: You must be in the facility at least 30 minutes prior to your scheduled procedure. . Children: Do not bring children with you. . Dress appropriately: Bring dark clothing that you would not mind if they get stained. . Valuables: Do not bring any  jewelry or valuables.  Procedure appointments are reserved for interventional treatments only. Marland Kitchen No Prescription Refills. . No medication changes will be discussed during procedure appointments. . No disability issues will be discussed.  Reasons to call and reschedule or cancel your procedure: (Following these recommendations will minimize the risk of a serious complication.) . Surgeries: Avoid having procedures  within 2 weeks of any surgery. (Avoid for 2 weeks before or after any surgery). . Flu Shots: Avoid having procedures within 2 weeks of a flu shots or . (Avoid for 2 weeks before or after immunizations). . Barium: Avoid having a procedure within 7-10 days after having had a radiological study involving the use of radiological contrast. (Myelograms, Barium swallow or enema study). . Heart attacks: Avoid any elective procedures or surgeries for the initial 6 months after a "Myocardial Infarction" (Heart Attack). . Blood thinners: It is imperative that you stop these medications before procedures. Let us know if you if you take any blood thinner.  . Infection: Avoid procedures during or within two weeks of an infection (including chest colds or gastrointestinal problems). Symptoms associated with infections include: Localized redness, fever, chills, night sweats or profuse sweating, burning sensation when voiding, cough, congestion, stuffiness, runny nose, sore throat, diarrhea, nausea, vomiting, cold or Flu symptoms, recent or current infections. It is specially important if the infection is over the area that we intend to treat. Marland Kitchen Heart and lung problems: Symptoms that may suggest an active cardiopulmonary problem include: cough, chest pain, breathing difficulties or shortness of breath, dizziness, ankle swelling, uncontrolled high or unusually low blood pressure, and/or palpitations. If you are experiencing any of these symptoms, cancel your procedure and contact your primary care physician for an evaluation.  Remember:  Regular Business hours are:  Monday to Thursday 8:00 AM to 4:00 PM  Provider's Schedule: Milinda Pointer, MD:  Procedure days: Tuesday and Thursday 7:30 AM to 4:00 PM  Gillis Santa, MD:  Procedure days: Monday and Wednesday 7:30 AM to 4:00 PM ____________________________________________________________________________________________   Epidural Steroid Injection An epidural  steroid injection is a shot of steroid medicine and numbing medicine that is given into the space between the spinal cord and the bones in your back (epidural space). The shot helps relieve pain caused by an irritated or swollen nerve root. The amount of pain relief you get from the injection depends on what is causing the nerve to be swollen and irritated, and how long your pain lasts. You are more likely to benefit from this injection if your pain is strong and comes on suddenly rather than if you have had pain for a long time. Tell a health care provider about:  Any allergies you have.  All medicines you are taking, including vitamins, herbs, eye drops, creams, and over-the-counter medicines.  Any problems you or family members have had with anesthetic medicines.  Any blood disorders you have.  Any surgeries you have had.  Any medical conditions you have.  Whether you are pregnant or may be pregnant. What are the risks? Generally, this is a safe procedure. However, problems may occur, including:  Headache.  Bleeding.  Infection.  Allergic reaction to medicines.  Damage to your nerves.  What happens before the procedure? Staying hydrated Follow instructions from your health care provider about hydration, which may include:  Up to 2 hours before the procedure - you may continue to drink clear liquids, such as water, clear fruit juice, black coffee, and plain tea.  Eating and drinking restrictions Follow  instructions from your health care provider about eating and drinking, which may include:  8 hours before the procedure - stop eating heavy meals or foods such as meat, fried foods, or fatty foods.  6 hours before the procedure - stop eating light meals or foods, such as toast or cereal.  6 hours before the procedure - stop drinking milk or drinks that contain milk.  2 hours before the procedure - stop drinking clear liquids.  Medicine  You may be given medicines to  lower anxiety.  Ask your health care provider about: ? Changing or stopping your regular medicines. This is especially important if you are taking diabetes medicines or blood thinners. ? Taking medicines such as aspirin and ibuprofen. These medicines can thin your blood. Do not take these medicines before your procedure if your health care provider instructs you not to. General instructions  Plan to have someone take you home from the hospital or clinic. What happens during the procedure?  You may receive a medicine to help you relax (sedative).  You will be asked to lie on your abdomen.  The injection site will be cleaned.  A numbing medicine (local anesthetic) will be used to numb the injection site.  A needle will be inserted through your skin into the epidural space. You may feel some discomfort when this happens. An X-ray machine will be used to make sure the needle is put as close as possible to the affected nerve.  A steroid medicine and a local anesthetic will be injected into the epidural space.  The needle will be removed.  A bandage (dressing) will be put over the injection site. What happens after the procedure?  Your blood pressure, heart rate, breathing rate, and blood oxygen level will be monitored until the medicines you were given have worn off.  Your arm or leg may feel weak or numb for a few hours.  The injection site may feel sore.  Do not drive for 24 hours if you received a sedative. This information is not intended to replace advice given to you by your health care provider. Make sure you discuss any questions you have with your health care provider. Document Released: 05/21/2007 Document Revised: 07/26/2015 Document Reviewed: 05/30/2015 [employer]vier Interactive Patient Education  [employer].

## 2017-10-29 ENCOUNTER — Other Ambulatory Visit: Payer: Self-pay | Admitting: Physician Assistant

## 2017-10-29 DIAGNOSIS — N644 Mastodynia: Secondary | ICD-10-CM

## 2017-10-29 LAB — COMP. METABOLIC PANEL (12)
AST: 49 IU/L — ABNORMAL HIGH (ref 0–40)
Albumin/Globulin Ratio: 1.3 (ref 1.2–2.2)
Albumin: 4.2 g/dL (ref 3.5–5.5)
Alkaline Phosphatase: 128 IU/L — ABNORMAL HIGH (ref 39–117)
BUN / CREAT RATIO: 11 (ref 9–23)
BUN: 9 mg/dL (ref 6–24)
Bilirubin Total: 0.5 mg/dL (ref 0.0–1.2)
CALCIUM: 9.7 mg/dL (ref 8.7–10.2)
Chloride: 107 mmol/L — ABNORMAL HIGH (ref 96–106)
Creatinine, Ser: 0.85 mg/dL (ref 0.57–1.00)
GFR calc Af Amer: 90 mL/min/{1.73_m2} (ref >59)
GFR calc non Af Amer: 78 mL/min/{1.73_m2} (ref >59)
Globulin, Total: 3.3 g/dL (ref 1.5–4.5)
Glucose: 108 mg/dL — ABNORMAL HIGH (ref 65–99)
Potassium: 4.4 mmol/L (ref 3.5–5.2)
SODIUM: 142 mmol/L (ref 134–144)
TOTAL PROTEIN: 7.5 g/dL (ref 6.0–8.5)

## 2017-11-01 LAB — TOXASSURE SELECT 13 (MW), URINE

## 2017-11-03 ENCOUNTER — Other Ambulatory Visit: Payer: Self-pay | Admitting: Anesthesiology

## 2017-11-03 ENCOUNTER — Ambulatory Visit: Payer: Medicaid Other | Admitting: Anesthesiology

## 2017-11-03 ENCOUNTER — Other Ambulatory Visit: Payer: Self-pay

## 2017-11-03 ENCOUNTER — Ambulatory Visit
Admission: RE | Admit: 2017-11-03 | Discharge: 2017-11-03 | Disposition: A | Discharge status: Home / Self Care | Payer: Medicaid Other | Source: Ambulatory Visit | Attending: Anesthesiology | Admitting: Anesthesiology

## 2017-11-03 ENCOUNTER — Encounter: Payer: Self-pay | Admitting: Anesthesiology

## 2017-11-03 VITALS — BP 114/98 | HR 89 | Temp 99.1°F | Resp 28 | Ht 65.0 in | Wt 230.0 lb

## 2017-11-03 DIAGNOSIS — G894 Chronic pain syndrome: Secondary | ICD-10-CM

## 2017-11-03 DIAGNOSIS — R52 Pain, unspecified: Secondary | ICD-10-CM

## 2017-11-03 DIAGNOSIS — M5030 Other cervical disc degeneration, unspecified cervical region: Secondary | ICD-10-CM | POA: Insufficient documentation

## 2017-11-03 DIAGNOSIS — M5441 Lumbago with sciatica, right side: Secondary | ICD-10-CM | POA: Diagnosis not present

## 2017-11-03 DIAGNOSIS — M5442 Lumbago with sciatica, left side: Secondary | ICD-10-CM

## 2017-11-03 DIAGNOSIS — Z79899 Other long term (current) drug therapy: Secondary | ICD-10-CM | POA: Insufficient documentation

## 2017-11-03 DIAGNOSIS — Z791 Long term (current) use of non-steroidal anti-inflammatories (NSAID): Secondary | ICD-10-CM | POA: Insufficient documentation

## 2017-11-03 DIAGNOSIS — M47816 Spondylosis without myelopathy or radiculopathy, lumbar region: Secondary | ICD-10-CM | POA: Diagnosis not present

## 2017-11-03 DIAGNOSIS — M79604 Pain in right leg: Secondary | ICD-10-CM | POA: Insufficient documentation

## 2017-11-03 DIAGNOSIS — M4686 Other specified inflammatory spondylopathies, lumbar region: Secondary | ICD-10-CM | POA: Insufficient documentation

## 2017-11-03 DIAGNOSIS — Z79891 Long term (current) use of opiate analgesic: Secondary | ICD-10-CM | POA: Diagnosis not present

## 2017-11-03 DIAGNOSIS — M79605 Pain in left leg: Secondary | ICD-10-CM | POA: Diagnosis not present

## 2017-11-03 DIAGNOSIS — G8929 Other chronic pain: Secondary | ICD-10-CM

## 2017-11-03 DIAGNOSIS — M542 Cervicalgia: Secondary | ICD-10-CM

## 2017-11-03 MED ORDER — ROPIVACAINE HCL 2 MG/ML IJ SOLN
10.0000 mL | Freq: Once | INTRAMUSCULAR | Status: AC
  Administered 2017-11-03: 1 mL via EPIDURAL

## 2017-11-03 MED ORDER — ROPIVACAINE HCL 2 MG/ML IJ SOLN
INTRAMUSCULAR | Status: AC
  Filled 2017-11-03: qty 10

## 2017-11-03 MED ORDER — LIDOCAINE HCL (PF) 1 % IJ SOLN
INTRAMUSCULAR | Status: AC
  Filled 2017-11-03: qty 5

## 2017-11-03 MED ORDER — TRIAMCINOLONE ACETONIDE 40 MG/ML IJ SUSP
40.0000 mg | Freq: Once | INTRAMUSCULAR | Status: AC
  Administered 2017-11-03: 40 mg

## 2017-11-03 MED ORDER — LIDOCAINE HCL (PF) 1 % IJ SOLN
5.0000 mL | Freq: Once | INTRAMUSCULAR | Status: AC
  Administered 2017-11-03: 5 mL via SUBCUTANEOUS

## 2017-11-03 MED ORDER — SODIUM CHLORIDE 0.9 % IJ SOLN
INTRAMUSCULAR | Status: AC
  Filled 2017-11-03: qty 10

## 2017-11-03 MED ORDER — OXYCODONE HCL 5 MG PO TABS: 5.0000 mg | ORAL_TABLET | ORAL | 0 refills | Status: DC | PRN

## 2017-11-03 MED ORDER — TRIAMCINOLONE ACETONIDE 40 MG/ML IJ SUSP
INTRAMUSCULAR | Status: AC
  Filled 2017-11-03: qty 1

## 2017-11-03 MED ORDER — IOPAMIDOL (ISOVUE-M 200) INJECTION 41%
20.0000 mL | Freq: Once | INTRAMUSCULAR | Status: DC | PRN
  Administered 2017-11-03: 10 mL
  Filled 2017-11-03: qty 20

## 2017-11-03 MED ORDER — IOPAMIDOL (ISOVUE-M 200) INJECTION 41%
INTRAMUSCULAR | Status: AC
  Filled 2017-11-03: qty 10

## 2017-11-03 MED ORDER — SODIUM CHLORIDE 0.9% FLUSH
10.0000 mL | Freq: Once | INTRAVENOUS | Status: AC
  Administered 2017-11-03: 5 mL

## 2017-11-03 NOTE — Progress Notes (Signed)
Safety precautions to be maintained throughout the outpatient stay will include: orient to surroundings, keep bed in low position, maintain call bell within reach at all times, provide assistance with transfer out of bed and ambulation.  

## 2017-11-03 NOTE — Patient Instructions (Signed)
____________________________________________________________________________________________  Post-Procedure Discharge Instructions  Instructions:  Apply ice: Fill a plastic sandwich bag with crushed ice. Cover it with a small towel and apply to injection site. Apply for 15 minutes then remove x 15 minutes. Repeat sequence on day of procedure, until you go to bed. The purpose is to minimize swelling and discomfort after procedure.  Apply heat: Apply heat to procedure site starting the day following the procedure. The purpose is to treat any soreness and discomfort from the procedure.  Food intake: Start with clear liquids (like water) and advance to regular food, as tolerated.   Physical activities: Keep activities to a minimum for the first 8 hours after the procedure.   Driving: If you have received any sedation, you are not allowed to drive for 24 hours after your procedure.  Blood thinner: Restart your blood thinner 6 hours after your procedure. (Only for those taking blood thinners)  Insulin: As soon as you can eat, you may resume your normal dosing schedule. (Only for those taking insulin)  Infection prevention: Keep procedure site clean and dry.  Post-procedure Pain Diary: Extremely important that this be done correctly and accurately. Recorded information will be used to determine the next step in treatment.  Pain evaluated is that of treated area only. Do not include pain from an untreated area.  Complete every hour, on the hour, for the initial 8 hours. Set an alarm to help you do this part accurately.  Do not go to sleep and have it completed later. It will not be accurate.  Follow-up appointment: Keep your follow-up appointment after the procedure. Usually 2 weeks for most procedures. (6 weeks in the case of radiofrequency.) Bring you pain diary.   Expect:  From numbing medicine (AKA: Local Anesthetics): Numbness or decrease in pain.  Onset: Full effect within 15  minutes of injected.  Duration: It will depend on the type of local anesthetic used. On the average, 1 to 8 hours.   From steroids: Decrease in swelling or inflammation. Once inflammation is improved, relief of the pain will follow.  Onset of benefits: Depends on the amount of swelling present. The more swelling, the longer it will take for the benefits to be seen. In some cases, up to 10 days.  Duration: Steroids will stay in the system x 2 weeks. Duration of benefits will depend on multiple posibilities including persistent irritating factors.  Occasional side-effects: Facial flushing, cramps (if present, drink Gatorade and take over-the-counter Magnesium 450-500 mg once to twice a day).  From procedure: Some discomfort is to be expected once the numbing medicine wears off. This should be minimal if ice and heat are applied as instructed.  Call if:  You experience numbness and weakness that gets worse with time, as opposed to wearing off.  New onset bowel or bladder incontinence. (This applies to Spinal procedures only)  Emergency Numbers:  [place] business hours (Monday - Thursday, 8:00 AM - 4:00 PM) (Friday, 9:00 AM - 12:00 Noon): [phone]  After hours: [phone] ____________________________________________________________________________________________  Oxycodone 5 mg x 2 months to begin filling on 11/28/17.  Sig 1 tablet q 4 hours qty 150 given to patient.

## 2017-11-03 NOTE — Progress Notes (Signed)
Subjective:  Patient ID: Hannah Maynard, female    DOB: 1965/10/12  Age: 52 y.o. MRN: [id number]  CC: Back Pain (lower) and Leg Pain (bilaterally)   Procedure: L4-5 epidural steroid under fluoroscopic guidance without sedation  HPI Abe People Schriner presents for valuation.  She was last seen recently 2 months ago and his receiving a series of epidural steroid injections.  Each epidural gives her approximately 75% relief lasting about 3 to 4 weeks.  She then gets recurrence of the same lower leg pain and low back pain as before.  Unfortunately she has failed to gain any significant improvement with physical therapy exercises however I question whether she is doing these.  She also appears to be limited in her ambulatory capacity and I do not believe she stretching.  The quality of her back pain is been stable in nature but problematic.  Continues to take her opioid medications 4 times a day and these are giving her good relief but she has frequent breakthrough pain during the day.  Based on her narcotic assessment sheet she does derive good functional lifestyle improvement with the medications but is failed conservative therapy with anti-inflammatories and muscle relaxants alone.  At this point she describes herself as being quite miserable with a VAS score of 10 frequently.  Outpatient Medications Prior to Visit  Medication Sig Dispense Refill  . meloxicam (MOBIC) 7.5 MG tablet Take 1 tablet (7.5 mg total) by mouth 2 (two) times daily. 60 tablet 2  . methocarbamol (ROBAXIN) 750 MG tablet Take 1 tablet (750 mg total) by mouth 3 (three) times daily. 90 tablet 3  . Nerve Stimulator (ZEWA TENS/EMS COMBO UNIT) DEVI by Does not apply route.    . pregabalin (LYRICA) 75 MG capsule Take 2 capsules (150 mg total) by mouth 2 (two) times daily. 120 capsule 1  . [START ON 11/28/2017] oxyCODONE (OXY IR/ROXICODONE) 5 MG immediate release tablet Take 1 tablet (5 mg total) by mouth every 6 (six) hours as needed  for severe pain. 120 tablet 0  . oxyCODONE (OXY IR/ROXICODONE) 5 MG immediate release tablet Take 1 tablet (5 mg total) by mouth every 6 (six) hours as needed for severe pain. 120 tablet 0  . ibuprofen (ADVIL,MOTRIN) 200 MG tablet Take 200 mg by mouth every 6 (six) hours as needed.     Facility-Administered Medications Prior to Visit  Medication Dose Route Frequency Provider Last Rate Last Dose  . lidocaine (PF) (XYLOCAINE) 1 % injection 5 mL  5 mL Subcutaneous Once Molli Barrows, MD      . ropivacaine (PF) 2 mg/mL (0.2%) (NAROPIN) injection 10 mL  10 mL Epidural Once Molli Barrows, MD        Review of Systems CNS: No confusion or sedation Cardiac: No angina or palpitations GI: No abdominal pain or constipation Constitutional: No nausea vomiting fevers or chills  Objective:  BP (!) 114/98   Pulse 89   Temp 99.1 F (37.3 C) (Oral)   Resp (!) 28   Ht 5\' 5"  (1.651 m)   Wt 230 lb (104.3 kg)   LMP  (LMP Unknown)   SpO2 99%   BMI 38.27 kg/m    BP Readings from Last 3 Encounters:  11/03/17 (!) 114/98  10/28/17 (!) 121/92  08/27/17 134/88     Wt Readings from Last 3 Encounters:  11/03/17 230 lb (104.3 kg)  10/28/17 238 lb (108 kg)  08/27/17 241 lb 9.6 oz (109.6 kg)  Physical Exam Pt is alert and oriented PERRL EOMI HEART IS RRR no murmur or rub LCTA no wheezing or rales MUSCULOSKELETAL reveals some paraspinous muscle tenderness but no overt trigger points.  She does have a positive straight leg raise on the right side.  Her muscle tone and bulk is at baseline.  Labs  No results found for: HGBA1C Lab Results  Component Value Date   CREATININE 0.85 10/28/2017    -------------------------------------------------------------------------------------------------------------------- Lab Results  Component Value Date   GLUCOSE 108 (H) 10/28/2017   AST 49 (H) 10/28/2017   NA 142 10/28/2017   K 4.4 10/28/2017   CL 107 (H) 10/28/2017   CREATININE 0.85 10/28/2017    BUN 9 10/28/2017    --------------------------------------------------------------------------------------------------------------------- Dg C-arm 1-60 Min-no Report  Result Date: 11/03/2017 Fluoroscopy was utilized by the requesting physician.  No radiographic interpretation.     Assessment & Plan:   [name] was seen today for back pain and leg pain.  Diagnoses and all orders for this visit:  Lumbar spondylosis -     Ambulatory referral to Neurosurgery  Cervicalgia  Chronic pain syndrome -     Discontinue: oxyCODONE (OXY IR/ROXICODONE) 5 MG immediate release tablet; Take 1 tablet (5 mg total) by mouth every 4 (four) hours as needed for severe pain. -     oxyCODONE (OXY IR/ROXICODONE) 5 MG immediate release tablet; Take 1 tablet (5 mg total) by mouth every 4 (four) hours as needed for severe pain.  Long term current use of opiate analgesic  Chronic bilateral low back pain with bilateral sciatica -     Ambulatory referral to Neurosurgery  DDD (degenerative disc disease), cervical -     Ambulatory referral to Neurosurgery  Facet arthritis of lumbar region  Chronic neck pain  Other orders -     triamcinolone acetonide (KENALOG-40) injection 40 mg -     sodium chloride flush (NS) 0.9 % injection 10 mL -     ropivacaine (PF) 2 mg/mL (0.2%) (NAROPIN) injection 10 mL -     lidocaine (PF) (XYLOCAINE) 1 % injection 5 mL -     iopamidol (ISOVUE-M) 41 % intrathecal injection 20 mL        ----------------------------------------------------------------------------------------------------------------------  Problem List Items Addressed This Visit      Unprioritized   Cervicalgia   Chronic low back pain (Chronic)   Relevant Medications   oxyCODONE (OXY IR/ROXICODONE) 5 MG immediate release tablet   triamcinolone acetonide (KENALOG-40) injection 40 mg (Completed)   Other Relevant Orders   Ambulatory referral to Neurosurgery   Chronic neck pain   Relevant Medications    oxyCODONE (OXY IR/ROXICODONE) 5 MG immediate release tablet   triamcinolone acetonide (KENALOG-40) injection 40 mg (Completed)   ropivacaine (PF) 2 mg/mL (0.2%) (NAROPIN) injection 10 mL (Completed)   lidocaine (PF) (XYLOCAINE) 1 % injection 5 mL (Completed)   Chronic pain syndrome   Relevant Medications   oxyCODONE (OXY IR/ROXICODONE) 5 MG immediate release tablet   DDD (degenerative disc disease), cervical   Relevant Medications   oxyCODONE (OXY IR/ROXICODONE) 5 MG immediate release tablet   triamcinolone acetonide (KENALOG-40) injection 40 mg (Completed)   Other Relevant Orders   Ambulatory referral to Neurosurgery   Long term current use of opiate analgesic   Lumbar spondylosis - Primary   Relevant Medications   oxyCODONE (OXY IR/ROXICODONE) 5 MG immediate release tablet   triamcinolone acetonide (KENALOG-40) injection 40 mg (Completed)   Other Relevant Orders   Ambulatory referral to Neurosurgery  Other Visit Diagnoses    Facet arthritis of lumbar region       Relevant Medications   oxyCODONE (OXY IR/ROXICODONE) 5 MG immediate release tablet   triamcinolone acetonide (KENALOG-40) injection 40 mg (Completed)        ----------------------------------------------------------------------------------------------------------------------  1. Lumbar spondylosis Secondary to the chronicity of her low back pain and going to refer her for neurosurgical consultation and evaluation.   - Ambulatory referral to Neurosurgery  2. Cervicalgia Continue with stretching strengthening exercises  3. Chronic pain syndrome Reviewed the [facility] practitioner database information and it is appropriate.  Secondary to the breakthrough pain and the recalcitrant nature of her pain I am going to increase her to 1 tablet every 4-6 hours and enable her to have up to 5 tablets/day. - oxyCODONE (OXY IR/ROXICODONE) 5 MG immediate release tablet; Take 1 tablet (5 mg total) by mouth every 4 (four)  hours as needed for severe pain.  Dispense: 150 tablet; Refill: 0  4. Long term current use of opiate analgesic As above  5. Chronic bilateral low back pain with bilateral sciatica We will plan on an L4-5 epidural steroid injection today.  Of gone over the risks and benefits of the procedure with her in full detail and all questions were answered. - Ambulatory referral to Neurosurgery  6. DDD (degenerative disc disease), cervical Continue core stretching strengthening exercises and upper extremity strengthening exercises - Ambulatory referral to Neurosurgery  7. Facet arthritis of lumbar region Above  8. Chronic neck pain     ----------------------------------------------------------------------------------------------------------------------  I am having Senia R. Cardamone maintain her ibuprofen, ZEWA TENS/EMS COMBO UNIT, meloxicam, methocarbamol, pregabalin, and oxyCODONE. We administered triamcinolone acetonide, sodium chloride flush, ropivacaine (PF) 2 mg/mL (0.2%), lidocaine (PF), and iopamidol. We will continue to administer ropivacaine (PF) 2 mg/mL (0.2%) and lidocaine (PF).   Meds ordered this encounter  Medications  . DISCONTD: oxyCODONE (OXY IR/ROXICODONE) 5 MG immediate release tablet    Sig: Take 1 tablet (5 mg total) by mouth every 4 (four) hours as needed for severe pain.    Dispense:  150 tablet    Refill:  0    Do not fill until:11/28/2017  . oxyCODONE (OXY IR/ROXICODONE) 5 MG immediate release tablet    Sig: Take 1 tablet (5 mg total) by mouth every 4 (four) hours as needed for severe pain.    Dispense:  150 tablet    Refill:  0    Do not fill until:12/29/2017  . triamcinolone acetonide (KENALOG-40) injection 40 mg  . sodium chloride flush (NS) 0.9 % injection 10 mL  . ropivacaine (PF) 2 mg/mL (0.2%) (NAROPIN) injection 10 mL  . lidocaine (PF) (XYLOCAINE) 1 % injection 5 mL  . iopamidol (ISOVUE-M) 41 % intrathecal injection 20 mL   Patient's Medications  New  Prescriptions   No medications on file  Previous Medications   IBUPROFEN (ADVIL,MOTRIN) 200 MG TABLET    Take 200 mg by mouth every 6 (six) hours as needed.   MELOXICAM (MOBIC) 7.5 MG TABLET    Take 1 tablet (7.5 mg total) by mouth 2 (two) times daily.   METHOCARBAMOL (ROBAXIN) 750 MG TABLET    Take 1 tablet (750 mg total) by mouth 3 (three) times daily.   NERVE STIMULATOR (ZEWA TENS/EMS COMBO UNIT) DEVI    by Does not apply route.   PREGABALIN (LYRICA) 75 MG CAPSULE    Take 2 capsules (150 mg total) by mouth 2 (two) times daily.  Modified Medications   Modified  Medication Previous Medication   OXYCODONE (OXY IR/ROXICODONE) 5 MG IMMEDIATE RELEASE TABLET oxyCODONE (OXY IR/ROXICODONE) 5 MG immediate release tablet      Take 1 tablet (5 mg total) by mouth every 4 (four) hours as needed for severe pain.    Take 1 tablet (5 mg total) by mouth every 6 (six) hours as needed for severe pain.  Discontinued Medications   OXYCODONE (OXY IR/ROXICODONE) 5 MG IMMEDIATE RELEASE TABLET    Take 1 tablet (5 mg total) by mouth every 6 (six) hours as needed for severe pain.   ----------------------------------------------------------------------------------------------------------------------  Follow-up: Return for evaluation, med refill.   Procedure: L4-5 LESI with fluoroscopic guidance and no moderate sedation  NOTE: The risks, benefits, and expectations of the procedure have been discussed and explained to the patient who was understanding and in agreement with suggested treatment plan. No guarantees were made.  DESCRIPTION OF PROCEDURE: Lumbar epidural steroid injection with no IV Versed, EKG, blood pressure, pulse, and pulse oximetry monitoring. The procedure was performed with the patient in the prone position under fluoroscopic guidance.  Sterile prep x3 was initiated and I then injected subcutaneous lidocaine to the overlying for 5 site after its fluoroscopic identifictation.  Using strict aseptic  technique, I then advanced an 18-gauge Tuohy epidural needle in the midline using interlaminar approach via loss-of-resistance to saline technique. There was negative aspiration for heme or  CSF.  I then confirmed position with both AP and Lateral fluoroscan.  2 cc of Isovue were injected and a  total of 5 mL of Preservative-Free normal saline mixed with 40 mg of Kenalog and 1cc Ropicaine 0.2 percent were injected incrementally via the  epidurally placed needle. The needle was removed. The patient tolerated the injection well and was convalesced and discharged to home in stable condition. Should the patient have any post procedure difficulty they have been instructed on how to contact us for assistance.    Molli Barrows, MD

## 2017-11-21 ENCOUNTER — Ambulatory Visit
Admission: RE | Admit: 2017-11-21 | Discharge: 2017-11-21 | Disposition: A | Discharge status: Home / Self Care | Payer: Medicaid Other | Source: Ambulatory Visit | Attending: Physician Assistant | Admitting: Physician Assistant

## 2017-11-21 DIAGNOSIS — N644 Mastodynia: Secondary | ICD-10-CM | POA: Diagnosis not present

## 2017-12-24 ENCOUNTER — Ambulatory Visit: Payer: Medicaid Other | Admitting: Nurse Practitioner

## 2018-01-19 ENCOUNTER — Encounter: Payer: Self-pay | Admitting: Nurse Practitioner

## 2018-01-19 ENCOUNTER — Ambulatory Visit: Payer: Medicaid Other | Attending: Nurse Practitioner | Admitting: Nurse Practitioner

## 2018-01-19 ENCOUNTER — Other Ambulatory Visit: Payer: Self-pay

## 2018-01-19 VITALS — BP 144/100 | HR 86 | Temp 98.4°F | Ht 65.0 in | Wt 250.0 lb

## 2018-01-19 DIAGNOSIS — F17210 Nicotine dependence, cigarettes, uncomplicated: Secondary | ICD-10-CM | POA: Diagnosis not present

## 2018-01-19 DIAGNOSIS — M47816 Spondylosis without myelopathy or radiculopathy, lumbar region: Secondary | ICD-10-CM | POA: Insufficient documentation

## 2018-01-19 DIAGNOSIS — F419 Anxiety disorder, unspecified: Secondary | ICD-10-CM | POA: Insufficient documentation

## 2018-01-19 DIAGNOSIS — M5441 Lumbago with sciatica, right side: Secondary | ICD-10-CM | POA: Diagnosis not present

## 2018-01-19 DIAGNOSIS — E669 Obesity, unspecified: Secondary | ICD-10-CM | POA: Diagnosis not present

## 2018-01-19 DIAGNOSIS — F329 Major depressive disorder, single episode, unspecified: Secondary | ICD-10-CM | POA: Insufficient documentation

## 2018-01-19 DIAGNOSIS — Z791 Long term (current) use of non-steroidal anti-inflammatories (NSAID): Secondary | ICD-10-CM | POA: Insufficient documentation

## 2018-01-19 DIAGNOSIS — G8929 Other chronic pain: Secondary | ICD-10-CM

## 2018-01-19 DIAGNOSIS — G894 Chronic pain syndrome: Secondary | ICD-10-CM | POA: Diagnosis not present

## 2018-01-19 DIAGNOSIS — Z880 Allergy status to penicillin: Secondary | ICD-10-CM | POA: Diagnosis not present

## 2018-01-19 DIAGNOSIS — M79606 Pain in leg, unspecified: Secondary | ICD-10-CM | POA: Insufficient documentation

## 2018-01-19 DIAGNOSIS — N644 Mastodynia: Secondary | ICD-10-CM | POA: Insufficient documentation

## 2018-01-19 DIAGNOSIS — Z79899 Other long term (current) drug therapy: Secondary | ICD-10-CM | POA: Insufficient documentation

## 2018-01-19 DIAGNOSIS — M79603 Pain in arm, unspecified: Secondary | ICD-10-CM | POA: Diagnosis not present

## 2018-01-19 DIAGNOSIS — M5030 Other cervical disc degeneration, unspecified cervical region: Secondary | ICD-10-CM | POA: Diagnosis not present

## 2018-01-19 DIAGNOSIS — Z6841 Body Mass Index (BMI) 40.0 and over, adult: Secondary | ICD-10-CM | POA: Insufficient documentation

## 2018-01-19 DIAGNOSIS — Z5181 Encounter for therapeutic drug level monitoring: Secondary | ICD-10-CM | POA: Diagnosis not present

## 2018-01-19 DIAGNOSIS — Z79891 Long term (current) use of opiate analgesic: Secondary | ICD-10-CM | POA: Diagnosis not present

## 2018-01-19 MED ORDER — KETOROLAC TROMETHAMINE 60 MG/2ML IM SOLN
60.0000 mg | Freq: Once | INTRAMUSCULAR | Status: AC
  Administered 2018-01-19: 60 mg via INTRAMUSCULAR
  Filled 2018-01-19: qty 2

## 2018-01-19 MED ORDER — PREGABALIN 75 MG PO CAPS: 150.0000 mg | ORAL_CAPSULE | Freq: Two times a day (BID) | ORAL | 1 refills | Status: DC

## 2018-01-19 MED ORDER — OXYCODONE HCL 5 MG PO CAPS: 5.0000 mg | ORAL_CAPSULE | ORAL | 0 refills | Status: DC | PRN

## 2018-01-19 MED ORDER — METHOCARBAMOL 750 MG PO TABS: 750.0000 mg | ORAL_TABLET | Freq: Three times a day (TID) | ORAL | 1 refills | Status: DC

## 2018-01-19 MED ORDER — OXYCODONE HCL 5 MG PO TABS: 5.0000 mg | ORAL_TABLET | ORAL | 0 refills | Status: DC | PRN

## 2018-01-19 MED ORDER — ORPHENADRINE CITRATE 30 MG/ML IJ SOLN
60.0000 mg | Freq: Once | INTRAMUSCULAR | Status: AC
  Administered 2018-01-19: 60 mg via INTRAMUSCULAR
  Filled 2018-01-19: qty 2

## 2018-01-19 MED ORDER — MELOXICAM 7.5 MG PO TABS: 7.5000 mg | ORAL_TABLET | Freq: Two times a day (BID) | ORAL | 1 refills | Status: DC

## 2018-01-19 NOTE — Progress Notes (Signed)
Patient's Name: Hannah Maynard  MRN: [id number]  Referring Provider: Nathaneil Canary, [place]*  DOB: 1965-03-01  PCP: Nathaneil Canary, PA-C  DOS: 01/19/2018  Note by: Vevelyn Francois NP  Service setting: Ambulatory outpatient  Specialty: Interventional Pain Management  Location: [facility] (AMB) Pain Management Facility    Patient type: Established    Primary Reason(s) for Visit: Encounter for prescription drug management. (Level of risk: moderate)  CC: Back Pain  HPI  Hannah Maynard is a 52 y.o. year old, female patient, who comes today for a medication management evaluation. She has Lumbar spondylosis; Obesity; Tobacco use; Right-sided low back pain with right-sided sciatica; Hot flushes, perimenopausal; Climacteric; Chronic low back pain; Chronic upper extremity pain; Chronic pain syndrome; Chronic neck pain; Other long term (current) drug therapy; Other reduced mobility; Other specified health status; Chronic, continuous use of opioids; DDD (degenerative disc disease), cervical; Cervicalgia; and Long term current use of opiate analgesic on their problem list. Her primarily concern today is the Back Pain  Pain Assessment: Location: Lower Back Radiating: pain radiaties down both leg Onset: More than a month ago Duration: Chronic pain Quality: Shooting, Aching, Constant, Throbbing Severity: 8 /10 (subjective, self-reported pain score)  Note: Reported level is compatible with observation. Clinically the patient looks like a 2/10 A 2/10 is viewed as "Mild to Moderate" and described as noticeable and distracting. Impossible to hide from other people. More frequent flare-ups. Still possible to adapt and function close to normal. It can be very annoying and may have occasional stronger flare-ups. With discipline, patients may get used to it and adapt.        Effect on ADL: limits my daily activites Timing: Constant Modifying factors: medications BP: (!) 144/100  HR: 86  Hannah Maynard was last  scheduled for an appointment on 10/28/2017 for medication management. During today's appointment we reviewed Hannah Maynard's chronic pain status, as well as her outpatient medication regimen.  The patient  reports that she does not use drugs. Her body mass index is 41.6 kg/m.  Further details on both, my assessment(s), as well as the proposed treatment plan, please see below.  Controlled Substance Pharmacotherapy Assessment REMS (Risk Evaluation and Mitigation Strategy)  Analgesic: Oxycodone 5 mg 4 times daily MME/day: 30 mg/day.  Chauncey Fischer, RN  01/19/2018  2:03 PM  Sign at close encounter Nursing Pain Medication Assessment:  Safety precautions to be maintained throughout the outpatient stay will include: orient to surroundings, keep bed in low position, maintain call bell within reach at all times, provide assistance with transfer out of bed and ambulation.  Medication Inspection Compliance: Pill count conducted under aseptic conditions, in front of the patient. Neither the pills nor the bottle was removed from the patient's sight at any time. Once count was completed pills were immediately returned to the patient in their original bottle.  Medication: Oxycodone IR Pill/Patch Count: 49 of 150 pills remain Pill/Patch Appearance: Markings consistent with prescribed medication Bottle Appearance: Standard pharmacy container. Clearly labeled. Filled Date: 30 / 9 / 2019 Last Medication intake:  Today   Pharmacokinetics: Liberation and absorption (onset of action): WNL Distribution (time to peak effect): WNL Metabolism and excretion (duration of action): WNL         Pharmacodynamics: Desired effects: Analgesia: Hannah Maynard reports >50% benefit. Functional ability: Patient reports that medication allows her to accomplish basic ADLs Clinically meaningful improvement in function (CMIF): Sustained CMIF goals met Perceived effectiveness: Described as relatively effective, allowing for increase  in  activities of daily living (ADL) Undesirable effects: Side-effects or Adverse reactions: None reported Monitoring: [place] PMP: Online review of the past 78-monthperiod conducted. Compliant with practice rules and regulations Last UDS on record: Summary  Date Value Ref Range Status  10/28/2017 FINAL  Final    Comment:    ==================================================================== TOXASSURE SELECT 13 (MW) ==================================================================== Test                             Result       Flag       Units Drug Present and Declared for Prescription Verification   Oxycodone                      745          EXPECTED   ng/mg creat   Oxymorphone                    267          EXPECTED   ng/mg creat   Noroxycodone                   779          EXPECTED   ng/mg creat   Noroxymorphone                 86           EXPECTED   ng/mg creat    Sources of oxycodone are scheduled prescription medications.    Oxymorphone, noroxycodone, and noroxymorphone are expected    metabolites of oxycodone. Oxymorphone is also available as a    scheduled prescription medication. ==================================================================== Test                      Result    Flag   Units      Ref Range   Creatinine              168              mg/dL      >=20 ==================================================================== Declared Medications:  The flagging and interpretation on this report are based on the  following declared medications.  Unexpected results may arise from  inaccuracies in the declared medications.  **Note: The testing scope of this panel includes these medications:  Oxycodone  **Note: The testing scope of this panel does not include following  reported medications:  Ibuprofen  Meloxicam  Methocarbamol  Pregabalin ==================================================================== For clinical consultation, please call [phone]  [phone] ====================================================================    UDS interpretation: Compliant          Medication Assessment Form: Reviewed. Patient indicates being compliant with therapy Treatment compliance: Compliant Risk Assessment Profile: Aberrant behavior: See prior evaluations. None observed or detected today Comorbid factors increasing risk of overdose: See prior notes. No additional risks detected today Opioid risk tool (ORT) (Total Score): 0 Personal History of Substance Abuse (SUD-Substance use disorder):  Alcohol: Negative  Illegal Drugs: Negative  Rx Drugs: Negative  ORT Risk Level calculation: Low Risk Risk of substance use disorder (SUD): Low Opioid Risk Tool - 01/19/18 1413      Family History of Substance Abuse   Alcohol  Negative    Illegal Drugs  Negative    Rx Drugs  Negative      Personal History of Substance Abuse   Alcohol  Negative    Illegal Drugs  Negative  Rx Drugs  Negative      Age   Age between 84-45 years   No      History of Preadolescent Sexual Abuse   History of Preadolescent Sexual Abuse  Negative or Female      Psychological Disease   Psychological Disease  Negative    Depression  Negative      Total Score   Opioid Risk Tool Scoring  0    Opioid Risk Interpretation  Low Risk      ORT Scoring interpretation table:  Score <3 = Low Risk for SUD  Score between 4-7 = Moderate Risk for SUD  Score >8 = High Risk for Opioid Abuse   Risk Mitigation Strategies:  Patient Counseling: Covered Patient-Prescriber Agreement (PPA): Present and active  Notification to other healthcare providers: Done  Pharmacologic Plan: No change in therapy, at this time.             Laboratory Chemistry  Inflammation Markers (CRP: Acute Phase) (ESR: Chronic Phase) Lab Results  Component Value Date   CRP 1.2 01/13/2017   ESRSEDRATE 10 01/13/2017                         Rheumatology Markers No results found for: RF, ANA, LABURIC,  URICUR, LYMEIGGIGMAB, LYMEABIGMQN, HLAB27                      Renal Function Markers Lab Results  Component Value Date   BUN 9 10/28/2017   CREATININE 0.85 10/28/2017   BCR 11 10/28/2017   GFRAA 90 10/28/2017   GFRNONAA 78 10/28/2017                             Hepatic Function Markers Lab Results  Component Value Date   AST 49 (H) 10/28/2017   ALBUMIN 4.2 10/28/2017   ALKPHOS 128 (H) 10/28/2017                        Electrolytes Lab Results  Component Value Date   NA 142 10/28/2017   K 4.4 10/28/2017   CL 107 (H) 10/28/2017   CALCIUM 9.7 10/28/2017   MG 1.8 01/13/2017                        Neuropathy Markers No results found for: VITAMINB12, FOLATE, HGBA1C, HIV                      CNS Tests No results found for: COLORCSF, APPEARCSF, RBCCOUNTCSF, WBCCSF, POLYSCSF, LYMPHSCSF, EOSCSF, PROTEINCSF, GLUCCSF, JCVIRUS, CSFOLI, IGGCSF                      Bone Pathology Markers No results found for: VD25OH, [id number], IR5188CZ6, SA6301SW1, 25OHVITD1, 25OHVITD2, 25OHVITD3, TESTOFREE, TESTOSTERONE                       Coagulation Parameters No results found for: INR, LABPROT, APTT, PLT, DDIMER, LABHEMA                      Cardiovascular Markers No results found for: BNP, CKTOTAL, CKMB, TROPONINI, HGB, HCT                       CA Markers No results found for: CEA, CA125, LABCA2  Note: Lab results reviewed.  Recent Diagnostic Imaging Results  MM DIAG BREAST TOMO BILATERAL CLINICAL DATA:  52 year old female with diffuse, intermittent left breast pain for approximately 2 months.  EXAM: DIGITAL DIAGNOSTIC BILATERAL MAMMOGRAM WITH CAD AND TOMO  COMPARISON:  Previous exam(s).  ACR Breast Density Category c: The breast tissue is heterogeneously dense, which may obscure small masses.  FINDINGS: No suspicious mammographic findings are identified in either breast. Punctate calcifications scattered throughout the left breast are stable dating  back to 2014.  Mammographic images were processed with CAD.  IMPRESSION: No mammographic evidence of malignancy.  RECOMMENDATION: 1. Clinical follow-up recommended for the painful area of concern in the left breast. Any further workup should be based on clinical grounds. 2.  Screening mammogram in one year.(Code:[id number]-B-01Y)  I have discussed the findings and recommendations with the patient. Results were also provided in writing at the conclusion of the visit. If applicable, a reminder letter will be sent to the patient regarding the next appointment.  BI-RADS CATEGORY  2: Benign.  Electronically Signed   By: Kristopher Oppenheim M.D.   On: 11/21/2017 15:23  Complexity Note: Imaging results reviewed. Results shared with Ms. Cuoco, using Layman's terms.                         Meds   Current Outpatient Medications:  .  ibuprofen (ADVIL,MOTRIN) 200 MG tablet, Take 200 mg by mouth every 6 (six) hours as needed., Disp: , Rfl:  .  meloxicam (MOBIC) 7.5 MG tablet, Take 1 tablet (7.5 mg total) by mouth 2 (two) times daily., Disp: 60 tablet, Rfl: 1 .  methocarbamol (ROBAXIN) 750 MG tablet, Take 1 tablet (750 mg total) by mouth 3 (three) times daily., Disp: 90 tablet, Rfl: 1 .  Nerve Stimulator (ZEWA TENS/EMS COMBO UNIT) DEVI, by Does not apply route., Disp: , Rfl:  .  [START ON 03/04/2018] oxycodone (OXY-IR) 5 MG capsule, Take 1 capsule (5 mg total) by mouth every 4 (four) hours as needed., Disp: 150 capsule, Rfl: 0 .  [START ON 02/02/2018] oxyCODONE (OXY IR/ROXICODONE) 5 MG immediate release tablet, Take 1 tablet (5 mg total) by mouth every 4 (four) hours as needed for severe pain., Disp: 150 tablet, Rfl: 0 .  pregabalin (LYRICA) 75 MG capsule, Take 2 capsules (150 mg total) by mouth 2 (two) times daily., Disp: 120 capsule, Rfl: 1  Current Facility-Administered Medications:  .  lidocaine (PF) (XYLOCAINE) 1 % injection 5 mL, 5 mL, Subcutaneous, Once, Adams, Alvina Filbert, MD .  ropivacaine (PF) 2  mg/mL (0.2%) (NAROPIN) injection 10 mL, 10 mL, Epidural, Once, Adams, Alvina Filbert, MD  ROS  Constitutional: Denies any fever or chills Gastrointestinal: No reported hemesis, hematochezia, vomiting, or acute GI distress Musculoskeletal: Denies any acute onset joint swelling, redness, loss of ROM, or weakness Neurological: No reported episodes of acute onset apraxia, aphasia, dysarthria, agnosia, amnesia, paralysis, loss of coordination, or loss of consciousness  Allergies  Ms. Kincheloe is allergic to duloxetine and penicillins.  PFSH  Drug: Ms. Lazenby  reports that she does not use drugs. Alcohol:  reports that she does not drink alcohol. Tobacco:  reports that she has been smoking cigarettes. She has never used smokeless tobacco. Medical:  has a past medical history of Anxiety, Chronic back pain, and Depression. Surgical: Ms. Buckles  has a past surgical history that includes Lipoma excision. Family: family history includes Breast cancer in her paternal aunt; Pancreatic cancer in her father; Stomach  cancer in her maternal uncle.  Constitutional Exam  General appearance: Well nourished, well developed, and well hydrated. In no apparent acute distress Vitals:   01/19/18 1403  BP: (!) 144/100  Pulse: 86  Temp: 98.4 F (36.9 C)  SpO2: 97%  Weight: 250 lb (113.4 kg)  Height: 5' 5"  (1.651 m)  Psych/Mental status: Alert, oriented x 3 (person, place, & time)       Eyes: PERLA Respiratory: No evidence of acute respiratory distress  Cervical Spine Area Exam  Skin & Axial Inspection: No masses, redness, edema, swelling, or associated skin lesions Alignment: Symmetrical Functional ROM: Unrestricted ROM      Stability: No instability detected Muscle Tone/Strength: Functionally intact. No obvious neuro-muscular anomalies detected. Sensory (Neurological): Unimpaired Palpation: No palpable anomalies              Upper Extremity (UE) Exam    Side: Right upper extremity  Side: Left upper extremity   Skin & Extremity Inspection: Skin color, temperature, and hair growth are WNL. No peripheral edema or cyanosis. No masses, redness, swelling, asymmetry, or associated skin lesions. No contractures.  Skin & Extremity Inspection: Skin color, temperature, and hair growth are WNL. No peripheral edema or cyanosis. No masses, redness, swelling, asymmetry, or associated skin lesions. No contractures.  Functional ROM: Unrestricted ROM          Functional ROM: Unrestricted ROM          Muscle Tone/Strength: Functionally intact. No obvious neuro-muscular anomalies detected.  Muscle Tone/Strength: Functionally intact. No obvious neuro-muscular anomalies detected.  Sensory (Neurological): Unimpaired          Sensory (Neurological): Unimpaired          Palpation: No palpable anomalies              Palpation: No palpable anomalies              Provocative Test(s):  Phalen's test: deferred Tinel's test: deferred Apley's scratch test (touch opposite shoulder):  Action 1 (Across chest): deferred Action 2 (Overhead): deferred Action 3 (LB reach): deferred   Provocative Test(s):  Phalen's test: deferred Tinel's test: deferred Apley's scratch test (touch opposite shoulder):  Action 1 (Across chest): deferred Action 2 (Overhead): deferred Action 3 (LB reach): deferred    Thoracic Spine Area Exam  Skin & Axial Inspection: No masses, redness, or swelling Alignment: Symmetrical Functional ROM: Unrestricted ROM Stability: No instability detected Muscle Tone/Strength: Functionally intact. No obvious neuro-muscular anomalies detected. Sensory (Neurological): Unimpaired Muscle strength & Tone: No palpable anomalies  Lumbar Spine Area Exam  Skin & Axial Inspection: No masses, redness, or swelling Alignment: Symmetrical Functional ROM: Unrestricted ROM       Stability: No instability detected Muscle Tone/Strength: Functionally intact. No obvious neuro-muscular anomalies detected. Sensory (Neurological):  Unimpaired Palpation: No palpable anomalies        Gait & Posture Assessment  Ambulation: Unassisted Gait: Relatively normal for age and body habitus Posture: WNL   Lower Extremity Exam    Side: Right lower extremity  Side: Left lower extremity  Stability: No instability observed          Stability: No instability observed          Skin & Extremity Inspection: Skin color, temperature, and hair growth are WNL. No peripheral edema or cyanosis. No masses, redness, swelling, asymmetry, or associated skin lesions. No contractures.  Skin & Extremity Inspection: Skin color, temperature, and hair growth are WNL. No peripheral edema or cyanosis. No masses, redness, swelling, asymmetry,  or associated skin lesions. No contractures.  Functional ROM: Unrestricted ROM                  Functional ROM: Unrestricted ROM                  Muscle Tone/Strength: Functionally intact. No obvious neuro-muscular anomalies detected.  Muscle Tone/Strength: Functionally intact. No obvious neuro-muscular anomalies detected.  Sensory (Neurological): Dermatomal pain pattern        Sensory (Neurological): Unimpaired            Palpation: No palpable anomalies  Palpation: No palpable anomalies   Assessment  Primary Diagnosis & Pertinent Problem List: The primary encounter diagnosis was Lumbar spondylosis. Diagnoses of DDD (degenerative disc disease), cervical, Chronic right-sided low back pain with right-sided sciatica, and Chronic pain syndrome were also pertinent to this visit.  Status Diagnosis  Persistent Controlled Persistent 1. Lumbar spondylosis   2. DDD (degenerative disc disease), cervical   3. Chronic right-sided low back pain with right-sided sciatica   4. Chronic pain syndrome     Problems updated and reviewed during this visit: No problems updated. Plan of Care  Pharmacotherapy (Medications Ordered): Meds ordered this encounter  Medications  . meloxicam (MOBIC) 7.5 MG tablet    Sig: Take 1 tablet  (7.5 mg total) by mouth 2 (two) times daily.    Dispense:  60 tablet    Refill:  1    Order Specific Question:   Supervising Provider    Answer:   Milinda Pointer [phone]  . methocarbamol (ROBAXIN) 750 MG tablet    Sig: Take 1 tablet (750 mg total) by mouth 3 (three) times daily.    Dispense:  90 tablet    Refill:  1    Order Specific Question:   Supervising Provider    Answer:   Milinda Pointer [phone]  . pregabalin (LYRICA) 75 MG capsule    Sig: Take 2 capsules (150 mg total) by mouth 2 (two) times daily.    Dispense:  120 capsule    Refill:  1    Order Specific Question:   Supervising Provider    Answer:   Milinda Pointer [phone]  . oxyCODONE (OXY IR/ROXICODONE) 5 MG immediate release tablet    Sig: Take 1 tablet (5 mg total) by mouth every 4 (four) hours as needed for severe pain.    Dispense:  150 tablet    Refill:  0    Do not place this medication, or any other prescription from our practice, on "Automatic Refill". Patient may have prescription filled one day early if pharmacy is closed on scheduled refill date.    Order Specific Question:   Supervising Provider    Answer:   Milinda Pointer [phone]  . oxycodone (OXY-IR) 5 MG capsule    Sig: Take 1 capsule (5 mg total) by mouth every 4 (four) hours as needed.    Dispense:  150 capsule    Refill:  0    Do not place this medication, or any other prescription from our practice, on "Automatic Refill". Patient may have prescription filled one day early if pharmacy is closed on scheduled refill date.    Order Specific Question:   Supervising Provider    Answer:   Milinda Pointer [phone]  . orphenadrine (NORFLEX) injection 60 mg  . ketorolac (TORADOL) injection 60 mg   New Prescriptions   No medications on file   Medications administered today: We administered orphenadrine and ketorolac. Lab-work, procedure(s), and/or referral(s):  Orders Placed This Encounter  Procedures  . Lumbar Epidural Injection    Imaging and/or referral(s): None  Interventional therapies: Planned, scheduled, and/or pending:   right-sided lumbar epidural steroid injection   Provider-requested follow-up: Return in about 2 months (around 03/21/2018) for MedMgmt, w/ Dr. Andree Elk.  Future Appointments  Date Time Provider [place]  01/27/2018  1:15 PM Molli Barrows, MD [facility] None   Primary Care Physician: Gunnar Bulla Location: [facility] Note by: Vevelyn Francois NP Date: 01/19/2018; Time: 11:20 AM  Pain Score Disclaimer: We use the NRS-11 scale. This is a self-reported, subjective measurement of pain severity with only modest accuracy. It is used primarily to identify changes within a particular patient. It must be understood that outpatient pain scales are significantly less accurate that those used for research, where they can be applied under ideal controlled circumstances with minimal exposure to variables. In reality, the score is likely to be a combination of pain intensity and pain affect, where pain affect describes the degree of emotional arousal or changes in action readiness caused by the sensory experience of pain. Factors such as social and work situation, setting, emotional state, anxiety levels, expectation, and prior pain experience may influence pain perception and show large inter-individual differences that may also be affected by time variables.  Patient instructions provided during this appointment: Patient Instructions   ____________________________________________________________________________________________  Medication Rules  Purpose: To inform patients, and their family members, of our rules and regulations.  Applies to: All patients receiving prescriptions (written or electronic).  Pharmacy of record: Pharmacy where electronic prescriptions will be sent. If written prescriptions are taken to a different pharmacy, please inform the  nursing staff. The pharmacy listed in the electronic medical record should be the one where you would like electronic prescriptions to be sent.  Electronic prescriptions: In compliance with the [place] (STOP) Act of 2017 (Session Lanny Cramp [phone]), effective February 25, 2018, all controlled substances must be electronically prescribed. Calling prescriptions to the pharmacy will cease to exist.  Prescription refills: Only during scheduled appointments. Applies to all prescriptions.  NOTE: The following applies primarily to controlled substances (Opioid* Pain Medications).   Patient's responsibilities: 1. Pain Pills: Bring all pain pills to every appointment (except for procedure appointments). 2. Pill Bottles: Bring pills in original pharmacy bottle. Always bring the newest bottle. Bring bottle, even if empty. 3. Medication refills: You are responsible for knowing and keeping track of what medications you take and those you need refilled. The day before your appointment: write a list of all prescriptions that need to be refilled. The day of the appointment: give the list to the admitting nurse. Prescriptions will be written only during appointments. If you forget a medication: it will not be "Called in", "Faxed", or "electronically sent". You will need to get another appointment to get these prescribed. No early refills. Do not call asking to have your prescription filled early. 4. Prescription Accuracy: You are responsible for carefully inspecting your prescriptions before leaving our office. Have the discharge nurse carefully go over each prescription with you, before taking them home. Make sure that your name is accurately spelled, that your address is correct. Check the name and dose of your medication to make sure it is accurate. Check the number of pills, and the written instructions to make sure they are clear and accurate. Make sure that you are given  enough medication to last until your next medication refill  appointment. 5. Taking Medication: Take medication as prescribed. When it comes to controlled substances, taking less pills or less frequently than prescribed is permitted and encouraged. Never take more pills than instructed. Never take medication more frequently than prescribed.  6. Inform other Doctors: Always inform, all of your healthcare providers, of all the medications you take. 7. Pain Medication from other Providers: You are not allowed to accept any additional pain medication from any other Doctor or Healthcare provider. There are two exceptions to this rule. (see below) In the event that you require additional pain medication, you are responsible for notifying us, as stated below. 8. Medication Agreement: You are responsible for carefully reading and following our Medication Agreement. This must be signed before receiving any prescriptions from our practice. Safely store a copy of your signed Agreement. Violations to the Agreement will result in no further prescriptions. (Additional copies of our Medication Agreement are available upon request.) 9. Laws, Rules, & Regulations: All patients are expected to follow all Federal and [employer], Rules, [employer]. Ignorance of the Laws does not constitute a valid excuse. The use of any illegal substances is prohibited. 10. Adopted CDC guidelines & recommendations: Target dosing levels will be at or below 60 MME/day. Use of benzodiazepines** is not recommended.  Exceptions: There are only two exceptions to the rule of not receiving pain medications from other Healthcare Providers. 1. Exception #1 (Emergencies): In the event of an emergency (i.e.: accident requiring emergency care), you are allowed to receive additional pain medication. However, you are responsible for: As soon as you are able, call our office [phone], at any time of the day or night, and leave a message  stating your name, the date and nature of the emergency, and the name and dose of the medication prescribed. In the event that your call is answered by a member of our staff, make sure to document and save the date, time, and the name of the person that took your information.  2. Exception #2 (Planned Surgery): In the event that you are scheduled by another doctor or dentist to have any type of surgery or procedure, you are allowed (for a period no longer than 30 days), to receive additional pain medication, for the acute post-op pain. However, in this case, you are responsible for picking up a copy of our "Post-op Pain Management for Surgeons" handout, and giving it to your surgeon or dentist. This document is available at our office, and does not require an appointment to obtain it. Simply go to our office during business hours (Monday-Thursday from 8:00 AM to 4:00 PM) (Friday 8:00 AM to 12:00 Noon) or if you have a scheduled appointment with [place], prior to your surgery, and ask for it by name. In addition, you will need to provide [place] with your name, name of your surgeon, type of surgery, and date of procedure or surgery.  *Opioid medications include: morphine, codeine, oxycodone, oxymorphone, hydrocodone, hydromorphone, meperidine, tramadol, tapentadol, buprenorphine, fentanyl, methadone. **Benzodiazepine medications include: diazepam (Valium), alprazolam (Xanax), clonazepam (Klonopine), lorazepam (Ativan), clorazepate (Tranxene), chlordiazepoxide (Librium), estazolam (Prosom), oxazepam (Serax), temazepam (Restoril), triazolam (Halcion) (Last updated: 04/24/2017) ____________________________________________________________________________________________    BMI Assessment: Estimated body mass index is 41.6 kg/m as calculated from the following:   Height as of this encounter: 5' 5"  (1.651 m).   Weight as of this encounter: 250 lb (113.4 kg).  BMI interpretation table: BMI level Category Range  association with higher incidence of chronic pain  <18 kg/m2 Underweight  18.5-24.9 kg/m2 Ideal body weight   25-29.9 kg/m2 Overweight Increased incidence by 20%  30-34.9 kg/m2 Obese (Class I) Increased incidence by 68%  35-39.9 kg/m2 Severe obesity (Class II) Increased incidence by 136%  >40 kg/m2 Extreme obesity (Class III) Increased incidence by 254%   Patient's current BMI Ideal Body weight  Body mass index is 41.6 kg/m. Ideal body weight: 57 kg (125 lb 10.6 oz) Adjusted ideal body weight: 79.6 kg (175 lb 6.4 oz)   BMI Readings from Last 4 Encounters:  01/19/18 41.60 kg/m  11/03/17 38.27 kg/m  10/28/17 39.61 kg/m  08/27/17 40.20 kg/m   Wt Readings from Last 4 Encounters:  01/19/18 250 lb (113.4 kg)  11/03/17 230 lb (104.3 kg)  10/28/17 238 lb (108 kg)  08/27/17 241 lb 9.6 oz (109.6 kg)

## 2018-01-19 NOTE — Progress Notes (Signed)
Nursing Pain Medication Assessment:  Safety precautions to be maintained throughout the outpatient stay will include: orient to surroundings, keep bed in low position, maintain call bell within reach at all times, provide assistance with transfer out of bed and ambulation.  Medication Inspection Compliance: Pill count conducted under aseptic conditions, in front of the patient. Neither the pills nor the bottle was removed from the patient's sight at any time. Once count was completed pills were immediately returned to the patient in their original bottle.  Medication: Oxycodone IR Pill/Patch Count: 49 of 150 pills remain Pill/Patch Appearance: Markings consistent with prescribed medication Bottle Appearance: Standard pharmacy container. Clearly labeled. Filled Date: 18 / 9 / 2019 Last Medication intake:  Today

## 2018-01-19 NOTE — Patient Instructions (Signed)
____________________________________________________________________________________________  Medication Rules  Purpose: To inform patients, and their family members, of our rules and regulations.  Applies to: All patients receiving prescriptions (written or electronic).  Pharmacy of record: Pharmacy where electronic prescriptions will be sent. If written prescriptions are taken to a different pharmacy, please inform the nursing staff. The pharmacy listed in the electronic medical record should be the one where you would like electronic prescriptions to be sent.  Electronic prescriptions: In compliance with the [place] [employer]rengthen Opioid Misuse Prevention (STOP) Act of 2017 (Session Law 2017-74/H243), effective February 25, 2018, all controlled substances must be electronically prescribed. Calling prescriptions to the pharmacy will cease to exist.  Prescription refills: Only during scheduled appointments. Applies to all prescriptions.  NOTE: The following applies primarily to controlled substances (Opioid* Pain Medications).   Patient's responsibilities: 1. Pain Pills: Bring all pain pills to every appointment (except for procedure appointments). 2. Pill Bottles: Bring pills in original pharmacy bottle. Always bring the newest bottle. Bring bottle, even if empty. 3. Medication refills: You are responsible for knowing and keeping track of what medications you take and those you need refilled. The day before your appointment: write a list of all prescriptions that need to be refilled. The day of the appointment: give the list to the admitting nurse. Prescriptions will be written only during appointments. If you forget a medication: it will not be "Called in", "Faxed", or "electronically sent". You will need to get another appointment to get these prescribed. No early refills. Do not call asking to have your prescription filled early. 4. Prescription Accuracy: You are responsible for  carefully inspecting your prescriptions before leaving our office. Have the discharge nurse carefully go over each prescription with you, before taking them home. Make sure that your name is accurately spelled, that your address is correct. Check the name and dose of your medication to make sure it is accurate. Check the number of pills, and the written instructions to make sure they are clear and accurate. Make sure that you are given enough medication to last until your next medication refill appointment. 5. Taking Medication: Take medication as prescribed. When it comes to controlled substances, taking less pills or less frequently than prescribed is permitted and encouraged. Never take more pills than instructed. Never take medication more frequently than prescribed.  6. Inform other Doctors: Always inform, all of your healthcare providers, of all the medications you take. 7. Pain Medication from other Providers: You are not allowed to accept any additional pain medication from any other Doctor or Healthcare provider. There are two exceptions to this rule. (see below) In the event that you require additional pain medication, you are responsible for notifying us, as stated below. 8. Medication Agreement: You are responsible for carefully reading and following our Medication Agreement. This must be signed before receiving any prescriptions from our practice. Safely store a copy of your signed Agreement. Violations to the Agreement will result in no further prescriptions. (Additional copies of our Medication Agreement are available upon request.) 9. Laws, Rules, & Regulations: All patients are expected to follow all Federal and State Laws, Statutes, Rules, & Regulations. Ignorance of the Laws does not constitute a valid excuse. The use of any illegal substances is prohibited. 10. Adopted CDC guidelines & recommendations: Target dosing levels will be at or below 60 MME/day. Use of benzodiazepines** is not  recommended.  Exceptions: There are only two exceptions to the rule of not receiving pain medications from other Healthcare Providers. 1.   Exception #1 (Emergencies): In the event of an emergency (i.e.: accident requiring emergency care), you are allowed to receive additional pain medication. However, you are responsible for: As soon as you are able, call our office [phone], at any time of the day or night, and leave a message stating your name, the date and nature of the emergency, and the name and dose of the medication prescribed. In the event that your call is answered by a member of our staff, make sure to document and save the date, time, and the name of the person that took your information.  2. Exception #2 (Planned Surgery): In the event that you are scheduled by another doctor or dentist to have any type of surgery or procedure, you are allowed (for a period no longer than 30 days), to receive additional pain medication, for the acute post-op pain. However, in this case, you are responsible for picking up a copy of our "Post-op Pain Management for Surgeons" handout, and giving it to your surgeon or dentist. This document is available at our office, and does not require an appointment to obtain it. Simply go to our office during business hours (Monday-Thursday from 8:00 AM to 4:00 PM) (Friday 8:00 AM to 12:00 Noon) or if you have a scheduled appointment with [place], prior to your surgery, and ask for it by name. In addition, you will need to provide [place] with your name, name of your surgeon, type of surgery, and date of procedure or surgery.  *Opioid medications include: morphine, codeine, oxycodone, oxymorphone, hydrocodone, hydromorphone, meperidine, tramadol, tapentadol, buprenorphine, fentanyl, methadone. **Benzodiazepine medications include: diazepam (Valium), alprazolam (Xanax), clonazepam (Klonopine), lorazepam (Ativan), clorazepate (Tranxene), chlordiazepoxide (Librium), estazolam (Prosom),  oxazepam (Serax), temazepam (Restoril), triazolam (Halcion) (Last updated: 04/24/2017) ____________________________________________________________________________________________    BMI Assessment: Estimated body mass index is 41.6 kg/m as calculated from the following:   Height as of this encounter: 5\' 5"  (1.651 m).   Weight as of this encounter: 250 lb (113.4 kg).  BMI interpretation table: BMI level Category Range association with higher incidence of chronic pain  <18 kg/m2 Underweight   18.5-24.9 kg/m2 Ideal body weight   25-29.9 kg/m2 Overweight Increased incidence by 20%  30-34.9 kg/m2 Obese (Class I) Increased incidence by 68%  35-39.9 kg/m2 Severe obesity (Class II) Increased incidence by 136%  >40 kg/m2 Extreme obesity (Class III) Increased incidence by 254%   Patient's current BMI Ideal Body weight  Body mass index is 41.6 kg/m. Ideal body weight: 57 kg (125 lb 10.6 oz) Adjusted ideal body weight: 79.6 kg (175 lb 6.4 oz)   BMI Readings from Last 4 Encounters:  01/19/18 41.60 kg/m  11/03/17 38.27 kg/m  10/28/17 39.61 kg/m  08/27/17 40.20 kg/m   Wt Readings from Last 4 Encounters:  01/19/18 250 lb (113.4 kg)  11/03/17 230 lb (104.3 kg)  10/28/17 238 lb (108 kg)  08/27/17 241 lb 9.6 oz (109.6 kg)

## 2018-01-26 ENCOUNTER — Ambulatory Visit: Payer: Medicaid Other | Admitting: Nurse Practitioner

## 2018-01-27 ENCOUNTER — Ambulatory Visit: Payer: Medicaid Other | Admitting: Anesthesiology

## 2018-01-27 ENCOUNTER — Ambulatory Visit
Admission: RE | Admit: 2018-01-27 | Discharge: 2018-01-27 | Disposition: A | Discharge status: Home / Self Care | Payer: Medicaid Other | Source: Ambulatory Visit | Attending: Anesthesiology | Admitting: Anesthesiology

## 2018-01-27 ENCOUNTER — Other Ambulatory Visit: Payer: Self-pay

## 2018-01-27 ENCOUNTER — Other Ambulatory Visit: Payer: Self-pay | Admitting: Anesthesiology

## 2018-01-27 ENCOUNTER — Encounter: Payer: Self-pay | Admitting: Anesthesiology

## 2018-01-27 VITALS — BP 130/88 | HR 93 | Temp 98.8°F | Resp 16 | Ht 65.5 in | Wt 240.0 lb

## 2018-01-27 DIAGNOSIS — M47816 Spondylosis without myelopathy or radiculopathy, lumbar region: Secondary | ICD-10-CM | POA: Insufficient documentation

## 2018-01-27 DIAGNOSIS — G894 Chronic pain syndrome: Secondary | ICD-10-CM | POA: Diagnosis not present

## 2018-01-27 DIAGNOSIS — M5441 Lumbago with sciatica, right side: Secondary | ICD-10-CM

## 2018-01-27 DIAGNOSIS — Z79899 Other long term (current) drug therapy: Secondary | ICD-10-CM | POA: Diagnosis not present

## 2018-01-27 DIAGNOSIS — R52 Pain, unspecified: Secondary | ICD-10-CM

## 2018-01-27 DIAGNOSIS — R29898 Other symptoms and signs involving the musculoskeletal system: Secondary | ICD-10-CM

## 2018-01-27 DIAGNOSIS — M5030 Other cervical disc degeneration, unspecified cervical region: Secondary | ICD-10-CM | POA: Diagnosis not present

## 2018-01-27 DIAGNOSIS — Z79891 Long term (current) use of opiate analgesic: Secondary | ICD-10-CM

## 2018-01-27 DIAGNOSIS — Z791 Long term (current) use of non-steroidal anti-inflammatories (NSAID): Secondary | ICD-10-CM | POA: Diagnosis not present

## 2018-01-27 DIAGNOSIS — F1190 Opioid use, unspecified, uncomplicated: Secondary | ICD-10-CM

## 2018-01-27 DIAGNOSIS — M542 Cervicalgia: Secondary | ICD-10-CM

## 2018-01-27 DIAGNOSIS — R531 Weakness: Secondary | ICD-10-CM | POA: Diagnosis not present

## 2018-01-27 DIAGNOSIS — G8929 Other chronic pain: Secondary | ICD-10-CM

## 2018-01-27 DIAGNOSIS — M5136 Other intervertebral disc degeneration, lumbar region: Secondary | ICD-10-CM

## 2018-01-27 MED ORDER — LIDOCAINE HCL (PF) 1 % IJ SOLN
5.0000 mL | Freq: Once | INTRAMUSCULAR | Status: AC
  Administered 2018-01-27: 5 mL via SUBCUTANEOUS
  Filled 2018-01-27: qty 5

## 2018-01-27 MED ORDER — IOPAMIDOL (ISOVUE-M 200) INJECTION 41%
20.0000 mL | Freq: Once | INTRAMUSCULAR | Status: DC | PRN
  Administered 2018-01-27: 1 mL
  Filled 2018-01-27: qty 20

## 2018-01-27 MED ORDER — SODIUM CHLORIDE (PF) 0.9 % IJ SOLN
INTRAMUSCULAR | Status: AC
  Filled 2018-01-27: qty 10

## 2018-01-27 MED ORDER — TRIAMCINOLONE ACETONIDE 40 MG/ML IJ SUSP
40.0000 mg | Freq: Once | INTRAMUSCULAR | Status: AC
  Administered 2018-01-27: 40 mg
  Filled 2018-01-27: qty 1

## 2018-01-27 MED ORDER — IOPAMIDOL (ISOVUE-M 200) INJECTION 41%
INTRAMUSCULAR | Status: AC
  Filled 2018-01-27: qty 10

## 2018-01-27 MED ORDER — ROPIVACAINE HCL 2 MG/ML IJ SOLN
10.0000 mL | Freq: Once | INTRAMUSCULAR | Status: AC
  Administered 2018-01-27: 10 mL via EPIDURAL
  Filled 2018-01-27: qty 10

## 2018-01-27 MED ORDER — SODIUM CHLORIDE 0.9% FLUSH
10.0000 mL | Freq: Once | INTRAVENOUS | Status: AC
  Administered 2018-01-27: 10 mL

## 2018-01-27 NOTE — Patient Instructions (Signed)
Pain Management Discharge Instructions  General Discharge Instructions :  If you need to reach your doctor call: Monday-Friday 8:00 am - 4:00 pm at [phone] or toll free [phone].  After clinic hours [phone] to have operator reach doctor.  Bring all of your medication bottles to all your appointments in the pain clinic.  To cancel or reschedule your appointment with Pain Management please remember to call 24 hours in advance to avoid a fee.  Refer to the educational materials which you have been given on: General Risks, I had my Procedure. Discharge Instructions, Post Sedation.  Post Procedure Instructions:  The drugs you were given will stay in your system until tomorrow, so for the next 24 hours you should not drive, make any legal decisions or drink any alcoholic beverages.  You may eat anything you prefer, but it is better to start with liquids then soups and crackers, and gradually work up to solid foods.  Please notify your doctor immediately if you have any unusual bleeding, trouble breathing or pain that is not related to your normal pain.  Depending on the type of procedure that was done, some parts of your body may feel week and/or numb.  This usually clears up by tonight or the next day.  Walk with the use of an assistive device or accompanied by an adult for the 24 hours.  You may use ice on the affected area for the first 24 hours.  Put ice in a Ziploc bag and cover with a towel and place against area 15 minutes on 15 minutes off.  You may switch to heat after 24 hours.Epidural Steroid Injection Patient Information  Description: The epidural space surrounds the nerves as they exit the spinal cord.  In some patients, the nerves can be compressed and inflamed by a bulging disc or a tight spinal canal (spinal stenosis).  By injecting steroids into the epidural space, we can bring irritated nerves into direct contact with a potentially helpful medication.  These  steroids act directly on the irritated nerves and can reduce swelling and inflammation which often leads to decreased pain.  Epidural steroids may be injected anywhere along the spine and from the neck to the low back depending upon the location of your pain.   After numbing the skin with local anesthetic (like Novocaine), a small needle is passed into the epidural space slowly.  You may experience a sensation of pressure while this is being done.  The entire block usually last less than 10 minutes.  Conditions which may be treated by epidural steroids:   Low back and leg pain  Neck and arm pain  Spinal stenosis  Post-laminectomy syndrome  Herpes zoster (shingles) pain  Pain from compression fractures  Preparation for the injection:  1. Do not eat any solid food or dairy products within 8 hours of your appointment.  2. You may drink clear liquids up to 3 hours before appointment.  Clear liquids include water, black coffee, juice or soda.  No milk or cream please. 3. You may take your regular medication, including pain medications, with a sip of water before your appointment  Diabetics should hold regular insulin (if taken separately) and take 1/2 normal NPH dos the morning of the procedure.  Carry some sugar containing items with you to your appointment. 4. A driver must accompany you and be prepared to drive you home after your procedure.  5. Bring all your current medications with your. 6. An IV may be inserted and   sedation may be given at the discretion of the physician.   7. A blood pressure cuff, EKG and other monitors will often be applied during the procedure.  Some patients may need to have extra oxygen administered for a short period. 8. You will be asked to provide medical information, including your allergies, prior to the procedure.  We must know immediately if you are taking blood thinners (like Coumadin/Warfarin)  Or if you are allergic to IV iodine contrast (dye). We must  know if you could possible be pregnant.  Possible side-effects:  Bleeding from needle site  Infection (rare, may require surgery)  Nerve injury (rare)  Numbness & tingling (temporary)  Difficulty urinating (rare, temporary)  Spinal headache ( a headache worse with upright posture)  Light -headedness (temporary)  Pain at injection site (several days)  Decreased blood pressure (temporary)  Weakness in arm/leg (temporary)  Pressure sensation in back/neck (temporary)  Call if you experience:  Fever/chills associated with headache or increased back/neck pain.  Headache worsened by an upright position.  New onset weakness or numbness of an extremity below the injection site  Hives or difficulty breathing (go to the emergency room)  Inflammation or drainage at the infection site  Severe back/neck pain  Any new symptoms which are concerning to you  Please note:  Although the local anesthetic injected can often make your back or neck feel good for several hours after the injection, the pain will likely return.  It takes 3-7 days for steroids to work in the epidural space.  You may not notice any pain relief for at least that one week.  If effective, we will often do a series of three injections spaced 3-6 weeks apart to maximally decrease your pain.  After the initial series, we generally will wait several months before considering a repeat injection of the same type.  If you have any questions, please call [phone] [facility] 

## 2018-01-27 NOTE — Progress Notes (Signed)
Safety precautions to be maintained throughout the outpatient stay will include: orient to surroundings, keep bed in low position, maintain call bell within reach at all times, provide assistance with transfer out of bed and ambulation.  

## 2018-01-27 NOTE — Progress Notes (Signed)
Subjective:  Patient ID: Hannah Maynard, female    DOB: Jun 01, 1965  Age: 52 y.o. MRN: [id number]  CC: Procedure (epidural )   Procedure: L4-5 to L5-S1 epidural steroid injection under fluoroscopic guidance with no sedation  HPI [facility] presents for reevaluation.  She was last seen 3 months ago and had an L4-5 epidural steroid injection.  She got good relief from this and reports a 75% improvement lasting approximately 6 to 8 weeks.  She is had a gradual recurrence of the same quality characteristic distribution of pain primarily affecting the bilateral lateral legs with radiation into the calves.  No change in lower extremity strength or function is noted however she does have periodic bilateral lower extremity weakness.  Her bowel and bladder function been stable in nature and otherwise she is in her usual state of health.  In the past she has been seen by Dr. Lacinda Axon who found her to be a nonsurgical candidate secondary to her obesity and smoking.  At this point she is doing reasonably well with her medication management and periodic epidural injections giving her good relief and allowing her to function relatively normally.  With the injection she is sleeping better and more active.  She is taking her medications as prescribed.  Outpatient Medications Prior to Visit  Medication Sig Dispense Refill  . ibuprofen (ADVIL,MOTRIN) 200 MG tablet Take 200 mg by mouth every 6 (six) hours as needed.    . meloxicam (MOBIC) 7.5 MG tablet Take 1 tablet (7.5 mg total) by mouth 2 (two) times daily. 60 tablet 1  . methocarbamol (ROBAXIN) 750 MG tablet Take 1 tablet (750 mg total) by mouth 3 (three) times daily. 90 tablet 1  . Nerve Stimulator (ZEWA TENS/EMS COMBO UNIT) DEVI by Does not apply route.    Derrill Memo ON 02/02/2018] oxyCODONE (OXY IR/ROXICODONE) 5 MG immediate release tablet Take 1 tablet (5 mg total) by mouth every 4 (four) hours as needed for severe pain. 150 tablet 0  . [START ON 03/04/2018]  oxycodone (OXY-IR) 5 MG capsule Take 1 capsule (5 mg total) by mouth every 4 (four) hours as needed. 150 capsule 0  . pregabalin (LYRICA) 75 MG capsule Take 2 capsules (150 mg total) by mouth 2 (two) times daily. 120 capsule 1   Facility-Administered Medications Prior to Visit  Medication Dose Route Frequency Provider Last Rate Last Dose  . lidocaine (PF) (XYLOCAINE) 1 % injection 5 mL  5 mL Subcutaneous Once Molli Barrows, MD      . ropivacaine (PF) 2 mg/mL (0.2%) (NAROPIN) injection 10 mL  10 mL Epidural Once Molli Barrows, MD        Review of Systems CNS: No confusion or sedation Cardiac: No angina or palpitations GI: No abdominal pain or constipation Constitutional: No nausea vomiting fevers or chills  Objective:  BP 130/88   Pulse 93   Temp 98.8 F (37.1 C)   Resp 16   Ht 5' 5.5" (1.664 m)   Wt 240 lb (108.9 kg)   LMP  (LMP Unknown)   SpO2 98%   BMI 39.33 kg/m    BP Readings from Last 3 Encounters:  01/27/18 130/88  01/19/18 (!) 144/100  11/03/17 (!) 114/98     Wt Readings from Last 3 Encounters:  01/27/18 240 lb (108.9 kg)  01/19/18 250 lb (113.4 kg)  11/03/17 230 lb (104.3 kg)     Physical Exam Pt is alert and oriented PERRL EOMI HEART IS RRR no  murmur or rub LCTA no wheezing or rales MUSCULOSKELETAL reveals some paraspinous muscle tenderness but no overt trigger points.  She does have a positive straight leg raise on the left side.  Equivocal on the right.  Her muscle tone and bulk is at baseline.  She walks with an antalgic gait and uses a cane for assistance.  Labs  No results found for: HGBA1C Lab Results  Component Value Date   CREATININE 0.85 10/28/2017    -------------------------------------------------------------------------------------------------------------------- Lab Results  Component Value Date   GLUCOSE 108 (H) 10/28/2017   AST 49 (H) 10/28/2017   NA 142 10/28/2017   K 4.4 10/28/2017   CL 107 (H) 10/28/2017   CREATININE 0.85  10/28/2017   BUN 9 10/28/2017    --------------------------------------------------------------------------------------------------------------------- Dg C-arm 1-60 Min-no Report  Result Date: 01/27/2018 Fluoroscopy was utilized by the requesting physician.  No radiographic interpretation.     Assessment & Plan:   [name] was seen today for procedure.  Diagnoses and all orders for this visit:  DDD (degenerative disc disease), cervical  Lumbar spondylosis -     Lumbar Epidural Injection -     Lumbar Epidural Injection; Future  Chronic right-sided low back pain with right-sided sciatica -     Lumbar Epidural Injection -     Lumbar Epidural Injection; Future  Chronic pain syndrome  Cervicalgia  Long term current use of opiate analgesic  Facet arthritis of lumbar region  Chronic, continuous use of opioids  Weakness of both lower extremities  DDD (degenerative disc disease), lumbar -     Lumbar Epidural Injection; Future  Other orders -     triamcinolone acetonide (KENALOG-40) injection 40 mg -     sodium chloride flush (NS) 0.9 % injection 10 mL -     ropivacaine (PF) 2 mg/mL (0.2%) (NAROPIN) injection 10 mL -     lidocaine (PF) (XYLOCAINE) 1 % injection 5 mL -     iopamidol (ISOVUE-M) 41 % intrathecal injection 20 mL        ----------------------------------------------------------------------------------------------------------------------  Problem List Items Addressed This Visit      Unprioritized   Cervicalgia   Chronic low back pain (Chronic)   Relevant Medications   triamcinolone acetonide (KENALOG-40) injection 40 mg (Completed)   Other Relevant Orders   Lumbar Epidural Injection   Chronic pain syndrome   Chronic, continuous use of opioids   DDD (degenerative disc disease), cervical - Primary   Relevant Medications   triamcinolone acetonide (KENALOG-40) injection 40 mg (Completed)   Long term current use of opiate analgesic   Lumbar spondylosis    Relevant Medications   triamcinolone acetonide (KENALOG-40) injection 40 mg (Completed)   Other Relevant Orders   Lumbar Epidural Injection    Other Visit Diagnoses    Facet arthritis of lumbar region       Relevant Medications   triamcinolone acetonide (KENALOG-40) injection 40 mg (Completed)   Weakness of both lower extremities       DDD (degenerative disc disease), lumbar       Relevant Medications   triamcinolone acetonide (KENALOG-40) injection 40 mg (Completed)   Other Relevant Orders   Lumbar Epidural Injection        ----------------------------------------------------------------------------------------------------------------------  1. Lumbar spondylosis We will proceed with a repeat epidural injection as reviewed with the patient today.  The risks and benefits of been reviewed in full detail and all questions been answered.  We will plan on doing this today with return to clinic in 2 months for  refill of her medications.  We will keep her on the same medications today and these have already been refilled. - Lumbar Epidural Injection - Lumbar Epidural Injection; Future  2. Chronic right-sided low back pain with right-sided sciatica As above. - Lumbar Epidural Injection - Lumbar Epidural Injection; Future  3. DDD (degenerative disc disease), cervical As above  4. Chronic pain syndrome Continue current medication management protocol.  We have reviewed the [facility] practitioner database information and it is appropriate.  5. Cervicalgia Continue with stretching strengthening exercises.  6. Long term current use of opiate analgesic   7. Facet arthritis of lumbar region   8. Chronic, continuous use of opioids As above  9. Weakness of both lower extremities Continue follow-up with Dr. Lacinda Axon if she is to have any changes.  This is been discussed in advance.  10. DDD (degenerative disc disease), lumbar As above - Lumbar Epidural Injection;  Future    ----------------------------------------------------------------------------------------------------------------------  I am having Angel R. Lupe maintain her ibuprofen, ZEWA TENS/EMS COMBO UNIT, meloxicam, methocarbamol, pregabalin, oxyCODONE, and oxycodone. We administered triamcinolone acetonide, sodium chloride flush, ropivacaine (PF) 2 mg/mL (0.2%), lidocaine (PF), and iopamidol. We will continue to administer ropivacaine (PF) 2 mg/mL (0.2%) and lidocaine (PF).   Meds ordered this encounter  Medications  . triamcinolone acetonide (KENALOG-40) injection 40 mg  . sodium chloride flush (NS) 0.9 % injection 10 mL  . ropivacaine (PF) 2 mg/mL (0.2%) (NAROPIN) injection 10 mL  . lidocaine (PF) (XYLOCAINE) 1 % injection 5 mL  . iopamidol (ISOVUE-M) 41 % intrathecal injection 20 mL   Patient's Medications  New Prescriptions   No medications on file  Previous Medications   IBUPROFEN (ADVIL,MOTRIN) 200 MG TABLET    Take 200 mg by mouth every 6 (six) hours as needed.   MELOXICAM (MOBIC) 7.5 MG TABLET    Take 1 tablet (7.5 mg total) by mouth 2 (two) times daily.   METHOCARBAMOL (ROBAXIN) 750 MG TABLET    Take 1 tablet (750 mg total) by mouth 3 (three) times daily.   NERVE STIMULATOR (ZEWA TENS/EMS COMBO UNIT) DEVI    by Does not apply route.   OXYCODONE (OXY IR/ROXICODONE) 5 MG IMMEDIATE RELEASE TABLET    Take 1 tablet (5 mg total) by mouth every 4 (four) hours as needed for severe pain.   OXYCODONE (OXY-IR) 5 MG CAPSULE    Take 1 capsule (5 mg total) by mouth every 4 (four) hours as needed.   PREGABALIN (LYRICA) 75 MG CAPSULE    Take 2 capsules (150 mg total) by mouth 2 (two) times daily.  Modified Medications   No medications on file  Discontinued Medications   No medications on file   ----------------------------------------------------------------------------------------------------------------------  Follow-up: Return in about 2 months (around 03/30/2018) for med refill,  evaluation.   Procedure: L4-5 transition to L5-S1 LESI with fluoroscopic guidance and no moderate sedation  NOTE: The risks, benefits, and expectations of the procedure have been discussed and explained to the patient who was understanding and in agreement with suggested treatment plan. No guarantees were made.  DESCRIPTION OF PROCEDURE: Lumbar epidural steroid injection with no IV Versed, EKG, blood pressure, pulse, and pulse oximetry monitoring. The procedure was performed with the patient in the prone position under fluoroscopic guidance.  Sterile prep x3 was initiated and I then injected subcutaneous lidocaine to the overlying L4-5 without success with advancement to L5-S1 site after its fluoroscopic identifictation.  Using strict aseptic technique, I then advanced an 18-gauge Tuohy epidural needle  in the midline using interlaminar approach via loss-of-resistance to saline technique. There was negative aspiration for heme or  CSF she did have a transient L5 paresthesia which did scare her significantly resolved spontaneously and immediately..  I then confirmed position with both AP and Lateral fluoroscan.  2 cc of Isovue were injected and a  total of 5 mL of Preservative-Free normal saline mixed with 40 mg of Kenalog and 1cc Ropicaine 0.2 percent were injected incrementally via the  epidurally placed needle. The needle was removed. The patient tolerated the injection well and was convalesced and discharged to home in stable condition with resolution of the above paresthesia. Should the patient have any post procedure difficulty they have been instructed on how to contact us for assistance.    Molli Barrows, MD

## 2018-01-28 ENCOUNTER — Telehealth: Payer: Self-pay | Admitting: *Deleted

## 2018-01-28 NOTE — Telephone Encounter (Signed)
Attempted to call for post procedure follow-up. Unable to leave a message, voicemailbox not set up.

## 2018-02-02 ENCOUNTER — Telehealth: Payer: Self-pay | Admitting: *Deleted

## 2018-02-02 ENCOUNTER — Other Ambulatory Visit: Payer: Self-pay | Admitting: Nurse Practitioner

## 2018-02-02 DIAGNOSIS — G894 Chronic pain syndrome: Secondary | ICD-10-CM

## 2018-02-02 MED ORDER — OXYCODONE HCL 5 MG PO TABS: 5.0000 mg | ORAL_TABLET | ORAL | 0 refills | Status: AC | PRN

## 2018-02-02 MED ORDER — OXYCODONE HCL 5 MG PO CAPS: 5.0000 mg | ORAL_CAPSULE | ORAL | 0 refills | Status: DC | PRN

## 2018-02-02 NOTE — Telephone Encounter (Signed)
Patient aware.

## 2018-02-02 NOTE — Telephone Encounter (Signed)
Called [facility], [place] and cancelled 2 Rx's for oxycodone 5 mg fill dates 02/02/18 and 03/04/17.  Rx has been sent to [facility].

## 2018-02-02 NOTE — Telephone Encounter (Signed)
Resent to [facility] Please cancel those at [facility]

## 2018-03-04 ENCOUNTER — Telehealth: Payer: Self-pay | Admitting: *Deleted

## 2018-03-04 NOTE — Telephone Encounter (Signed)
Jenny Reichmann can you please call the patient thank you

## 2018-03-04 NOTE — Telephone Encounter (Signed)
I did this about an hour ago.  I talked with the pharm and it was changed to a capsule. It does have to be PA per pharm. Since new year and she will send the form to [place] via fax.

## 2018-03-04 NOTE — Telephone Encounter (Signed)
Patient called to schedule her next med mgmt appt. She states that the pharmacy told her they need prior authorization for her pain meds and it needs to be changed to a tablet not a capsul. Please let patient know status of this as soon as possible.

## 2018-03-04 NOTE — Telephone Encounter (Signed)
Called patient to inform her that I talked with the pharm and she states if Pa is needed she will fax over PA form and also informed her that it could take several days to get approved

## 2018-03-04 NOTE — Telephone Encounter (Signed)
Called and spoke with Hannah Maynard and she stated on the [facility] prescription that capsules were order and Tablets have been ordered previosly.Marland Kitchen Spoke with [facility] and she verbally stated that pt should have capsule. Instructed that caps should be ordered.

## 2018-03-31 ENCOUNTER — Encounter: Payer: Medicaid Other | Admitting: Anesthesiology

## 2018-04-01 ENCOUNTER — Other Ambulatory Visit: Payer: Self-pay

## 2018-04-01 ENCOUNTER — Encounter: Payer: Self-pay | Admitting: Anesthesiology

## 2018-04-01 ENCOUNTER — Ambulatory Visit: Payer: Medicaid Other | Attending: Anesthesiology | Admitting: Anesthesiology

## 2018-04-01 VITALS — BP 153/96 | HR 92 | Temp 99.3°F | Resp 16 | Ht 65.5 in | Wt 238.4 lb

## 2018-04-01 DIAGNOSIS — G894 Chronic pain syndrome: Secondary | ICD-10-CM | POA: Diagnosis not present

## 2018-04-01 DIAGNOSIS — M5441 Lumbago with sciatica, right side: Secondary | ICD-10-CM

## 2018-04-01 DIAGNOSIS — Z79891 Long term (current) use of opiate analgesic: Secondary | ICD-10-CM | POA: Diagnosis present

## 2018-04-01 DIAGNOSIS — M542 Cervicalgia: Secondary | ICD-10-CM | POA: Diagnosis present

## 2018-04-01 DIAGNOSIS — M47816 Spondylosis without myelopathy or radiculopathy, lumbar region: Secondary | ICD-10-CM | POA: Diagnosis not present

## 2018-04-01 DIAGNOSIS — M5030 Other cervical disc degeneration, unspecified cervical region: Secondary | ICD-10-CM | POA: Insufficient documentation

## 2018-04-01 DIAGNOSIS — G8929 Other chronic pain: Secondary | ICD-10-CM | POA: Diagnosis present

## 2018-04-01 DIAGNOSIS — F1190 Opioid use, unspecified, uncomplicated: Secondary | ICD-10-CM | POA: Insufficient documentation

## 2018-04-01 MED ORDER — OXYCODONE HCL 5 MG PO TABS: 5.0000 mg | ORAL_TABLET | ORAL | 0 refills | Status: DC | PRN

## 2018-04-01 MED ORDER — OXYCODONE HCL 5 MG PO CAPS: 5.0000 mg | ORAL_CAPSULE | ORAL | 0 refills | Status: DC | PRN

## 2018-04-01 NOTE — Progress Notes (Signed)
Nursing Pain Medication Assessment:  Safety precautions to be maintained throughout the outpatient stay will include: orient to surroundings, keep bed in low position, maintain call bell within reach at all times, provide assistance with transfer out of bed and ambulation.  Medication Inspection Compliance: Pill count conducted under aseptic conditions, in front of the patient. Neither the pills nor the bottle was removed from the patient's sight at any time. Once count was completed pills were immediately returned to the patient in their original bottle.  Medication: Oxycodone IR Pill/Patch Count: 18 of 150 pills remain Pill/Patch Appearance: Markings consistent with prescribed medication Bottle Appearance: Standard pharmacy container. Clearly labeled. Filled Date: 1 / 10 / 2020 Last Medication intake:  Today

## 2018-04-01 NOTE — Patient Instructions (Signed)
____________________________________________________________________________________________  Preparing for Procedure with Sedation  Instructions: . Oral Intake: Do not eat or drink anything for at least 8 hours prior to your procedure. . Transportation: Public transportation is not allowed. Bring an adult driver. The driver must be physically present in our waiting room before any procedure can be started. . Physical Assistance: Bring an adult physically capable of assisting you, in the event you need help. This adult should keep you company at home for at least 6 hours after the procedure. . Blood Pressure Medicine: Take your blood pressure medicine with a sip of water the morning of the procedure. . Blood thinners: Notify our staff if you are taking any blood thinners. Depending on which one you take, there will be specific instructions on how and when to stop it. . Diabetics on insulin: Notify the staff so that you can be scheduled 1st case in the morning. If your diabetes requires high dose insulin, take only  of your normal insulin dose the morning of the procedure and notify the staff that you have done so. . Preventing infections: Shower with an antibacterial soap the morning of your procedure. . Build-up your immune system: Take 1000 mg of Vitamin C with every meal (3 times a day) the day prior to your procedure. . Antibiotics: Inform the staff if you have a condition or reason that requires you to take antibiotics before dental procedures. . Pregnancy: If you are pregnant, call and cancel the procedure. . Sickness: If you have a cold, fever, or any active infections, call and cancel the procedure. . Arrival: You must be in the facility at least 30 minutes prior to your scheduled procedure. . Children: Do not bring children with you. . Dress appropriately: Bring dark clothing that you would not mind if they get stained. . Valuables: Do not bring any jewelry or valuables.  Procedure  appointments are reserved for interventional treatments only. . No Prescription Refills. . No medication changes will be discussed during procedure appointments. . No disability issues will be discussed.  Reasons to call and reschedule or cancel your procedure: (Following these recommendations will minimize the risk of a serious complication.) . Surgeries: Avoid having procedures within 2 weeks of any surgery. (Avoid for 2 weeks before or after any surgery). . Flu Shots: Avoid having procedures within 2 weeks of a flu shots or . (Avoid for 2 weeks before or after immunizations). . Barium: Avoid having a procedure within 7-10 days after having had a radiological study involving the use of radiological contrast. (Myelograms, Barium swallow or enema study). . Heart attacks: Avoid any elective procedures or surgeries for the initial 6 months after a "Myocardial Infarction" (Heart Attack). . Blood thinners: It is imperative that you stop these medications before procedures. Let us know if you if you take any blood thinner.  . Infection: Avoid procedures during or within two weeks of an infection (including chest colds or gastrointestinal problems). Symptoms associated with infections include: Localized redness, fever, chills, night sweats or profuse sweating, burning sensation when voiding, cough, congestion, stuffiness, runny nose, sore throat, diarrhea, nausea, vomiting, cold or Flu symptoms, recent or current infections. It is specially important if the infection is over the area that we intend to treat. . Heart and lung problems: Symptoms that may suggest an active cardiopulmonary problem include: cough, chest pain, breathing difficulties or shortness of breath, dizziness, ankle swelling, uncontrolled high or unusually low blood pressure, and/or palpitations. If you are experiencing any of these symptoms, cancel   your procedure and contact your primary care physician for an evaluation.  Remember:    Regular Business hours are:  Monday to Thursday 8:00 AM to 4:00 PM  Provider's Schedule: Milinda Pointer, MD:  Procedure days: Tuesday and Thursday 7:30 AM to 4:00 PM  Gillis Santa, MD:  Procedure days: Monday and Wednesday 7:30 AM to 4:00 PM ____________________________________________________________________________________________   Epidural Steroid Injection An epidural steroid injection is a shot of steroid medicine and numbing medicine that is given into the space between the spinal cord and the bones in your back (epidural space). The shot helps relieve pain caused by an irritated or swollen nerve root. The amount of pain relief you get from the injection depends on what is causing the nerve to be swollen and irritated, and how long your pain lasts. You are more likely to benefit from this injection if your pain is strong and comes on suddenly rather than if you have had pain for a long time. Tell a health care provider about:   Any allergies you have.  All medicines you are taking, including vitamins, herbs, eye drops, creams, and over-the-counter medicines.  Any problems you or family members have had with anesthetic medicines.  Any blood disorders you have.  Any surgeries you have had.  Any medical conditions you have.  Whether you are pregnant or may be pregnant. What are the risks? Generally, this is a safe procedure. However, problems may occur, including:  Headache.  Bleeding.  Infection.  Allergic reaction to medicines.  Damage to your nerves. What happens before the procedure? Staying hydrated Follow instructions from your health care provider about hydration, which may include:  Up to 2 hours before the procedure - you may continue to drink clear liquids, such as water, clear fruit juice, black coffee, and plain tea. Eating and drinking restrictions Follow instructions from your health care provider about eating and drinking, which may  include:  8 hours before the procedure - stop eating heavy meals or foods such as meat, fried foods, or fatty foods.  6 hours before the procedure - stop eating light meals or foods, such as toast or cereal.  6 hours before the procedure - stop drinking milk or drinks that contain milk.  2 hours before the procedure - stop drinking clear liquids. Medicine  You may be given medicines to lower anxiety.  Ask your health care provider about: ? Changing or stopping your regular medicines. This is especially important if you are taking diabetes medicines or blood thinners. ? Taking medicines such as aspirin and ibuprofen. These medicines can thin your blood. Do not take these medicines before your procedure if your health care provider instructs you not to. General instructions  Plan to have someone take you home from the hospital or clinic. What happens during the procedure?  You may receive a medicine to help you relax (sedative).  You will be asked to lie on your abdomen.  The injection site will be cleaned.  A numbing medicine (local anesthetic) will be used to numb the injection site.  A needle will be inserted through your skin into the epidural space. You may feel some discomfort when this happens. An X-ray machine will be used to make sure the needle is put as close as possible to the affected nerve.  A steroid medicine and a local anesthetic will be injected into the epidural space.  The needle will be removed.  A bandage (dressing) will be put over the injection site. What  happens after the procedure?  Your blood pressure, heart rate, breathing rate, and blood oxygen level will be monitored until the medicines you were given have worn off.  Your arm or leg may feel weak or numb for a few hours.  The injection site may feel sore.  Do not drive for 24 hours if you received a sedative. This information is not intended to replace advice given to you by your health care  provider. Make sure you discuss any questions you have with your health care provider. Document Released: 05/21/2007 Document Revised: 10/30/2016 Document Reviewed: 05/30/2015 Elsevier Interactive Patient Education  2019 [employer].

## 2018-04-04 NOTE — Progress Notes (Signed)
Subjective:  Patient ID: Hannah Maynard, female    DOB: April 13, 1965  Age: 53 y.o. MRN: [id number]  CC: Back Pain (lower)   Procedure: None  HPI Hannah Maynard presents for reevaluation.  She was last seen 2 months ago and has been doing well since then with her medication management.  She continues to get good relief with her medicine with no significant change in the quality characteristic or distribution of her low back pain other than some gradual return of primarily right greater than left lower extremity pain.  This is consistent with what she has experienced in the past and she is received injection therapy for this and this is helped.  Otherwise she continues to have some low back spasming.  Based on her narcotic assessment sheet she is getting good relief with her medicine with no significant defects noted.  Outpatient Medications Prior to Visit  Medication Sig Dispense Refill  . ibuprofen (ADVIL,MOTRIN) 200 MG tablet Take 200 mg by mouth every 6 (six) hours as needed.    . meloxicam (MOBIC) 7.5 MG tablet Take 1 tablet (7.5 mg total) by mouth 2 (two) times daily. 60 tablet 1  . methocarbamol (ROBAXIN) 750 MG tablet Take 1 tablet (750 mg total) by mouth 3 (three) times daily. 90 tablet 1  . Nerve Stimulator (ZEWA TENS/EMS COMBO UNIT) DEVI by Does not apply route.    . pregabalin (LYRICA) 75 MG capsule Take 2 capsules (150 mg total) by mouth 2 (two) times daily. 120 capsule 1  . oxycodone (OXY-IR) 5 MG capsule Take 1 capsule (5 mg total) by mouth every 4 (four) hours as needed. 150 capsule 0   Facility-Administered Medications Prior to Visit  Medication Dose Route Frequency Provider Last Rate Last Dose  . lidocaine (PF) (XYLOCAINE) 1 % injection 5 mL  5 mL Subcutaneous Once Molli Barrows, MD      . ropivacaine (PF) 2 mg/mL (0.2%) (NAROPIN) injection 10 mL  10 mL Epidural Once Molli Barrows, MD        Review of Systems CNS: No confusion or sedation Cardiac: No angina or  palpitations GI: No abdominal pain or constipation Constitutional: No nausea vomiting fevers or chills  Objective:  BP (!) 153/96   Pulse 92   Temp 99.3 F (37.4 C) (Oral)   Resp 16   Ht 5' 5.5" (1.664 m)   Wt 238 lb 6.4 oz (108.1 kg)   LMP  (LMP Unknown)   SpO2 99%   BMI 39.07 kg/m    BP Readings from Last 3 Encounters:  04/01/18 (!) 153/96  01/27/18 130/88  01/19/18 (!) 144/100     Wt Readings from Last 3 Encounters:  04/01/18 238 lb 6.4 oz (108.1 kg)  01/27/18 240 lb (108.9 kg)  01/19/18 250 lb (113.4 kg)     Physical Exam Pt is alert and oriented PERRL EOMI HEART IS RRR no murmur or rub LCTA no wheezing or rales MUSCULOSKELETAL reveals some paraspinous muscle tenderness but no overt trigger points.  She has a positive straight leg raise on the right side with equivocal on the left her muscle tone and bulk is at baseline.  Walks with an antalgic gait.  Labs  No results found for: HGBA1C Lab Results  Component Value Date   CREATININE 0.85 10/28/2017    -------------------------------------------------------------------------------------------------------------------- Lab Results  Component Value Date   GLUCOSE 108 (H) 10/28/2017   AST 49 (H) 10/28/2017   NA 142 10/28/2017   K 4.4  10/28/2017   CL 107 (H) 10/28/2017   CREATININE 0.85 10/28/2017   BUN 9 10/28/2017    --------------------------------------------------------------------------------------------------------------------- Dg C-arm 1-60 Min-no Report  Result Date: 01/27/2018 Fluoroscopy was utilized by the requesting physician.  No radiographic interpretation.     Assessment & Plan:   Hannah Maynard was seen today for back pain.  Diagnoses and all orders for this visit:  Chronic pain syndrome -     Ambulatory referral to Physical Therapy  Lumbar spondylosis -     Lumbar Epidural Injection; Future -     Ambulatory referral to Physical Therapy  Chronic right-sided low back pain with  right-sided sciatica -     Lumbar Epidural Injection; Future -     Ambulatory referral to Physical Therapy  DDD (degenerative disc disease), cervical -     Lumbar Epidural Injection; Future  Cervicalgia  Long term current use of opiate analgesic  Facet arthritis of lumbar region  Chronic, continuous use of opioids  Chronic neck pain  Other orders -     oxycodone (OXY-IR) 5 MG capsule; Take 1 capsule (5 mg total) by mouth every 4 (four) hours as needed for up to 30 days. -     oxyCODONE (ROXICODONE) 5 MG immediate release tablet; Take 1 tablet (5 mg total) by mouth every 4 (four) hours as needed for up to 30 days for severe pain.        ----------------------------------------------------------------------------------------------------------------------  Problem List Items Addressed This Visit      Unprioritized   Cervicalgia   Chronic low back pain (Chronic)   Relevant Medications   oxycodone (OXY-IR) 5 MG capsule (Start on 04/06/2018)   oxyCODONE (ROXICODONE) 5 MG immediate release tablet (Start on 05/06/2018)   Other Relevant Orders   Lumbar Epidural Injection   Ambulatory referral to Physical Therapy   Chronic neck pain   Relevant Medications   oxycodone (OXY-IR) 5 MG capsule (Start on 04/06/2018)   oxyCODONE (ROXICODONE) 5 MG immediate release tablet (Start on 05/06/2018)   Chronic pain syndrome - Primary   Relevant Orders   Ambulatory referral to Physical Therapy   Chronic, continuous use of opioids   DDD (degenerative disc disease), cervical   Relevant Medications   oxycodone (OXY-IR) 5 MG capsule (Start on 04/06/2018)   oxyCODONE (ROXICODONE) 5 MG immediate release tablet (Start on 05/06/2018)   Other Relevant Orders   Lumbar Epidural Injection   Long term current use of opiate analgesic   Lumbar spondylosis   Relevant Medications   oxycodone (OXY-IR) 5 MG capsule (Start on 04/06/2018)   oxyCODONE (ROXICODONE) 5 MG immediate release tablet (Start on 05/06/2018)    Other Relevant Orders   Lumbar Epidural Injection   Ambulatory referral to Physical Therapy    Other Visit Diagnoses    Facet arthritis of lumbar region       Relevant Medications   oxycodone (OXY-IR) 5 MG capsule (Start on 04/06/2018)   oxyCODONE (ROXICODONE) 5 MG immediate release tablet (Start on 05/06/2018)        ----------------------------------------------------------------------------------------------------------------------  1. Chronic pain syndrome I want her to continue with her efforts at physical therapy stretching strengthening.  Have her continue this. - Ambulatory referral to Physical Therapy  2. Lumbar spondylosis Return to clinic in a few weeks for an epidural injection.  We have gone over the risks and benefits of this with her in full detail and all questions are answered.  Fortunately she has responded favorably to epidurals in the past generally getting 75% relief lasting  4 to 6 weeks prior to return of some baseline right greater than left low back and leg pain. - Lumbar Epidural Injection; Future - Ambulatory referral to Physical Therapy  3. Chronic right-sided low back pain with right-sided sciatica As above and continue core stretching strengthening exercises. - Lumbar Epidural Injection; Future - Ambulatory referral to Physical Therapy  4. DDD (degenerative disc disease), cervical As above - Lumbar Epidural Injection; Future  5. Cervicalgia   6. Long term current use of opiate analgesic   7. Facet arthritis of lumbar region   8. Chronic, continuous use of opioids We will have her continue with her current medication management as well.  Continue oxycodone 5 mg tablets approximately 4-5 times per day for breakthrough pain.  She has failed conservative therapy and medication management has been warranted.  Anti-inflammatories and physical therapy alone were ineffective and she is considered to be a nonsurgical candidate.  We will re-write for  her medicines for the next 2 months with return to clinic in approximately 1 month for her epidural.  I reviewed the [facility] practitioner database information and it is appropriate.  9. Chronic neck pain     ----------------------------------------------------------------------------------------------------------------------  I have changed Hannah Maynard's oxycodone. I am also having her start on oxyCODONE. Additionally, I am having her maintain her ibuprofen, ZEWA TENS/EMS COMBO UNIT, meloxicam, methocarbamol, and pregabalin. We will continue to administer ropivacaine (PF) 2 mg/mL (0.2%) and lidocaine (PF).   Meds ordered this encounter  Medications  . oxycodone (OXY-IR) 5 MG capsule    Sig: Take 1 capsule (5 mg total) by mouth every 4 (four) hours as needed for up to 30 days.    Dispense:  150 capsule    Refill:  0    Do not place this medication, or any other prescription from our practice, on "Automatic Refill". Patient may have prescription filled one day early if pharmacy is closed on scheduled refill date.  Marland Kitchen oxyCODONE (ROXICODONE) 5 MG immediate release tablet    Sig: Take 1 tablet (5 mg total) by mouth every 4 (four) hours as needed for up to 30 days for severe pain.    Dispense:  150 tablet    Refill:  0    30 day supply no early refill   Patient's Medications  New Prescriptions   OXYCODONE (ROXICODONE) 5 MG IMMEDIATE RELEASE TABLET    Take 1 tablet (5 mg total) by mouth every 4 (four) hours as needed for up to 30 days for severe pain.  Previous Medications   IBUPROFEN (ADVIL,MOTRIN) 200 MG TABLET    Take 200 mg by mouth every 6 (six) hours as needed.   MELOXICAM (MOBIC) 7.5 MG TABLET    Take 1 tablet (7.5 mg total) by mouth 2 (two) times daily.   METHOCARBAMOL (ROBAXIN) 750 MG TABLET    Take 1 tablet (750 mg total) by mouth 3 (three) times daily.   NERVE STIMULATOR (ZEWA TENS/EMS COMBO UNIT) DEVI    by Does not apply route.   PREGABALIN (LYRICA) 75 MG CAPSULE     Take 2 capsules (150 mg total) by mouth 2 (two) times daily.  Modified Medications   Modified Medication Previous Medication   OXYCODONE (OXY-IR) 5 MG CAPSULE oxycodone (OXY-IR) 5 MG capsule      Take 1 capsule (5 mg total) by mouth every 4 (four) hours as needed for up to 30 days.    Take 1 capsule (5 mg total) by mouth every 4 (four) hours  as needed.  Discontinued Medications   No medications on file   ----------------------------------------------------------------------------------------------------------------------  Follow-up: Return in about 1 month (around 04/30/2018) for procedure.    Molli Barrows, MD

## 2018-04-27 ENCOUNTER — Other Ambulatory Visit: Payer: Self-pay | Admitting: Nurse Practitioner

## 2018-04-30 ENCOUNTER — Encounter: Payer: Self-pay | Admitting: Anesthesiology

## 2018-04-30 ENCOUNTER — Ambulatory Visit
Admission: RE | Admit: 2018-04-30 | Discharge: 2018-04-30 | Disposition: A | Discharge status: Home / Self Care | Payer: Medicaid Other | Source: Ambulatory Visit | Attending: Anesthesiology | Admitting: Anesthesiology

## 2018-04-30 ENCOUNTER — Other Ambulatory Visit: Payer: Self-pay

## 2018-04-30 ENCOUNTER — Ambulatory Visit: Payer: Medicaid Other | Admitting: Anesthesiology

## 2018-04-30 ENCOUNTER — Other Ambulatory Visit: Payer: Self-pay | Admitting: Anesthesiology

## 2018-04-30 VITALS — BP 190/97 | HR 83 | Temp 97.5°F | Resp 18 | Ht 65.0 in | Wt 238.0 lb

## 2018-04-30 DIAGNOSIS — M5441 Lumbago with sciatica, right side: Secondary | ICD-10-CM

## 2018-04-30 DIAGNOSIS — F1190 Opioid use, unspecified, uncomplicated: Secondary | ICD-10-CM

## 2018-04-30 DIAGNOSIS — G8929 Other chronic pain: Secondary | ICD-10-CM | POA: Diagnosis present

## 2018-04-30 DIAGNOSIS — M542 Cervicalgia: Secondary | ICD-10-CM | POA: Diagnosis present

## 2018-04-30 DIAGNOSIS — M5030 Other cervical disc degeneration, unspecified cervical region: Secondary | ICD-10-CM | POA: Diagnosis present

## 2018-04-30 DIAGNOSIS — Z79891 Long term (current) use of opiate analgesic: Secondary | ICD-10-CM

## 2018-04-30 DIAGNOSIS — R52 Pain, unspecified: Secondary | ICD-10-CM

## 2018-04-30 DIAGNOSIS — M79641 Pain in right hand: Secondary | ICD-10-CM | POA: Diagnosis present

## 2018-04-30 DIAGNOSIS — M47816 Spondylosis without myelopathy or radiculopathy, lumbar region: Secondary | ICD-10-CM

## 2018-04-30 DIAGNOSIS — G894 Chronic pain syndrome: Secondary | ICD-10-CM

## 2018-04-30 MED ORDER — MIDAZOLAM HCL 5 MG/5ML IJ SOLN
INTRAMUSCULAR | Status: AC
  Filled 2018-04-30: qty 5

## 2018-04-30 MED ORDER — LACTATED RINGERS IV SOLN
1000.0000 mL | INTRAVENOUS | Status: DC
  Administered 2018-04-30: 1000 mL via INTRAVENOUS

## 2018-04-30 MED ORDER — IOPAMIDOL (ISOVUE-M 200) INJECTION 41%
20.0000 mL | Freq: Once | INTRAMUSCULAR | Status: DC | PRN
  Administered 2018-04-30: 10 mL
  Filled 2018-04-30: qty 20

## 2018-04-30 MED ORDER — TRIAMCINOLONE ACETONIDE 40 MG/ML IJ SUSP
40.0000 mg | Freq: Once | INTRAMUSCULAR | Status: AC
  Administered 2018-04-30: 40 mg
  Filled 2018-04-30: qty 1

## 2018-04-30 MED ORDER — MIDAZOLAM HCL 2 MG/2ML IJ SOLN
5.0000 mg | Freq: Once | INTRAMUSCULAR | Status: AC
  Administered 2018-04-30: 4 mg via INTRAVENOUS

## 2018-04-30 MED ORDER — LIDOCAINE HCL (PF) 1 % IJ SOLN
5.0000 mL | Freq: Once | INTRAMUSCULAR | Status: AC
  Administered 2018-04-30: 5 mL via SUBCUTANEOUS
  Filled 2018-04-30: qty 5

## 2018-04-30 MED ORDER — IOPAMIDOL (ISOVUE-M 200) INJECTION 41%
INTRAMUSCULAR | Status: AC
  Filled 2018-04-30: qty 10

## 2018-04-30 MED ORDER — ROPIVACAINE HCL 2 MG/ML IJ SOLN
10.0000 mL | Freq: Once | INTRAMUSCULAR | Status: AC
  Administered 2018-04-30: 10 mL via EPIDURAL
  Filled 2018-04-30: qty 10

## 2018-04-30 MED ORDER — SODIUM CHLORIDE 0.9% FLUSH
10.0000 mL | Freq: Once | INTRAVENOUS | Status: AC
  Administered 2018-04-30: 10 mL

## 2018-04-30 NOTE — Progress Notes (Signed)
Subjective:  Patient ID: Hannah Maynard, female    DOB: 10-Feb-1966  Age: 53 y.o. MRN: [id number]  CC: Back Pain (low)   Procedure: L5-S1 epidural steroid under fluoroscopic guidance with moderate sedation  HPI Abe People Strawser presents for reevaluation.  She was last seen several weeks ago and presents today requesting her scheduled epidural injection.  She continues to have low back pain with posterior lateral leg pain that is worse on the right side than left.  This radiates into the calf with associated cramping right side greater than left though present on the left side.  She has chronic weakness affecting her calves with progressive pain with ambulation.  Otherwise she is in her usual state of health and taking her medications as prescribed.  She is still taking oxycodone proximately 4-5 times a day and this helps based on her narcotic assessment sheet.  She denies any diverting or illicit use.  No changes with bowel or bladder function are noted.  Outpatient Medications Prior to Visit  Medication Sig Dispense Refill  . ibuprofen (ADVIL,MOTRIN) 200 MG tablet Take 200 mg by mouth every 6 (six) hours as needed.    . meloxicam (MOBIC) 7.5 MG tablet Take 1 tablet (7.5 mg total) by mouth 2 (two) times daily. 60 tablet 1  . methocarbamol (ROBAXIN) 750 MG tablet Take 1 tablet (750 mg total) by mouth 3 (three) times daily. 90 tablet 1  . Nerve Stimulator (ZEWA TENS/EMS COMBO UNIT) DEVI by Does not apply route.    Marland Kitchen oxycodone (OXY-IR) 5 MG capsule Take 1 capsule (5 mg total) by mouth every 4 (four) hours as needed for up to 30 days. 150 capsule 0  . [START ON 05/06/2018] oxyCODONE (ROXICODONE) 5 MG immediate release tablet Take 1 tablet (5 mg total) by mouth every 4 (four) hours as needed for up to 30 days for severe pain. 150 tablet 0  . pregabalin (LYRICA) 75 MG capsule Take 2 capsules (150 mg total) by mouth 2 (two) times daily. 120 capsule 1   Facility-Administered Medications Prior to Visit   Medication Dose Route Frequency Provider Last Rate Last Dose  . lidocaine (PF) (XYLOCAINE) 1 % injection 5 mL  5 mL Subcutaneous Once Molli Barrows, MD      . ropivacaine (PF) 2 mg/mL (0.2%) (NAROPIN) injection 10 mL  10 mL Epidural Once Molli Barrows, MD        Review of Systems CNS: No confusion or sedation Cardiac: No angina or palpitations GI: No abdominal pain or constipation Constitutional: No nausea vomiting fevers or chills  Objective:  BP 115/83   Pulse 83   Temp 98.2 F (36.8 C)   Resp 18   Ht 5\' 5"  (1.651 m)   Wt 238 lb (108 kg)   LMP  (LMP Unknown)   SpO2 100%   BMI 39.61 kg/m    BP Readings from Last 3 Encounters:  04/30/18 115/83  04/01/18 (!) 153/96  01/27/18 130/88     Wt Readings from Last 3 Encounters:  04/30/18 238 lb (108 kg)  04/01/18 238 lb 6.4 oz (108.1 kg)  01/27/18 240 lb (108.9 kg)     Physical Exam Pt is alert and oriented PERRL EOMI HEART IS RRR no murmur or rub LCTA no wheezing or rales MUSCULOSKELETAL reveals a positive straight leg raise on the right side negative on the left with some muscle tenderness in lumbar region She is also having some tenderness over the radial distribution on the  dorsum of the right hand.  Her hand grasp strength is good bilaterally as is her bicep tricep strength at 5/5.  No evidence of any edema or pseudomotor changes are evident.  Labs  No results found for: HGBA1C Lab Results  Component Value Date   CREATININE 0.85 10/28/2017    -------------------------------------------------------------------------------------------------------------------- Lab Results  Component Value Date   GLUCOSE 108 (H) 10/28/2017   AST 49 (H) 10/28/2017   NA 142 10/28/2017   K 4.4 10/28/2017   CL 107 (H) 10/28/2017   CREATININE 0.85 10/28/2017   BUN 9 10/28/2017    --------------------------------------------------------------------------------------------------------------------- No results  found.   Assessment & Plan:   [name] was seen today for back pain.  Diagnoses and all orders for this visit:  Chronic pain syndrome  Lumbar spondylosis -     Lumbar Epidural Injection; Future  Chronic right-sided low back pain with right-sided sciatica -     Lumbar Epidural Injection; Future  DDD (degenerative disc disease), cervical -     Lumbar Epidural Injection; Future  Cervicalgia  Long term current use of opiate analgesic  Chronic, continuous use of opioids  Right hand pain -     Ambulatory referral to Orthopedic Surgery  Other orders -     triamcinolone acetonide (KENALOG-40) injection 40 mg -     sodium chloride flush (NS) 0.9 % injection 10 mL -     ropivacaine (PF) 2 mg/mL (0.2%) (NAROPIN) injection 10 mL -     midazolam (VERSED) injection 5 mg -     lidocaine (PF) (XYLOCAINE) 1 % injection 5 mL -     lactated ringers infusion 1,000 mL -     iopamidol (ISOVUE-M) 41 % intrathecal injection 20 mL        ----------------------------------------------------------------------------------------------------------------------  Problem List Items Addressed This Visit      Unprioritized   Cervicalgia   Chronic pain syndrome - Primary   Chronic, continuous use of opioids   DDD (degenerative disc disease), cervical   Relevant Medications   triamcinolone acetonide (KENALOG-40) injection 40 mg   Other Relevant Orders   Lumbar Epidural Injection   Long term current use of opiate analgesic   Lumbar spondylosis   Relevant Medications   triamcinolone acetonide (KENALOG-40) injection 40 mg   Other Relevant Orders   Lumbar Epidural Injection   Right-sided low back pain with right-sided sciatica (Chronic)   Relevant Medications   triamcinolone acetonide (KENALOG-40) injection 40 mg   midazolam (VERSED) injection 5 mg   Other Relevant Orders   Lumbar Epidural Injection    Other Visit Diagnoses    Right hand pain       Relevant Orders   Ambulatory referral  to Orthopedic Surgery        ----------------------------------------------------------------------------------------------------------------------  1. Chronic pain syndrome Continue current medication management.  She is scheduled for return to clinic in 1 month.  2. Lumbar spondylosis We will proceed with a repeat epidural injection today.  The risks and benefits of been reviewed in full detail and all questions answered.  Continue core stretching strengthening exercises. - Lumbar Epidural Injection; Future  3. Chronic right-sided low back pain with right-sided sciatica As above - Lumbar Epidural Injection; Future  4. DDD (degenerative disc disease), cervical As above - Lumbar Epidural Injection; Future  5. Cervicalgia Continue with stretching strengthening exercises as reviewed for the cervical region.  6. Long term current use of opiate analgesi   7. Chronic, continuous use of opioids   8. Right hand pain  She is having some right hand pain on examination in the radial nerve distribution and she has requested an orthopedic consultation and we will assist with that referral today. - Ambulatory referral to Orthopedic Surgery    ----------------------------------------------------------------------------------------------------------------------  I am having [place] maintain her ibuprofen, ZEWA TENS/EMS COMBO UNIT, meloxicam, methocarbamol, pregabalin, oxycodone, and oxyCODONE. We will continue to administer ropivacaine (PF) 2 mg/mL (0.2%) and lidocaine (PF).   Meds ordered this encounter  Medications  . triamcinolone acetonide (KENALOG-40) injection 40 mg  . sodium chloride flush (NS) 0.9 % injection 10 mL  . ropivacaine (PF) 2 mg/mL (0.2%) (NAROPIN) injection 10 mL  . midazolam (VERSED) injection 5 mg  . lidocaine (PF) (XYLOCAINE) 1 % injection 5 mL  . lactated ringers infusion 1,000 mL  . iopamidol (ISOVUE-M) 41 % intrathecal injection 20 mL    Patient's Medications  New Prescriptions   No medications on file  Previous Medications   IBUPROFEN (ADVIL,MOTRIN) 200 MG TABLET    Take 200 mg by mouth every 6 (six) hours as needed.   MELOXICAM (MOBIC) 7.5 MG TABLET    Take 1 tablet (7.5 mg total) by mouth 2 (two) times daily.   METHOCARBAMOL (ROBAXIN) 750 MG TABLET    Take 1 tablet (750 mg total) by mouth 3 (three) times daily.   NERVE STIMULATOR (ZEWA TENS/EMS COMBO UNIT) DEVI    by Does not apply route.   OXYCODONE (OXY-IR) 5 MG CAPSULE    Take 1 capsule (5 mg total) by mouth every 4 (four) hours as needed for up to 30 days.   OXYCODONE (ROXICODONE) 5 MG IMMEDIATE RELEASE TABLET    Take 1 tablet (5 mg total) by mouth every 4 (four) hours as needed for up to 30 days for severe pain.   PREGABALIN (LYRICA) 75 MG CAPSULE    Take 2 capsules (150 mg total) by mouth 2 (two) times daily.  Modified Medications   No medications on file  Discontinued Medications   No medications on file   ----------------------------------------------------------------------------------------------------------------------  Follow-up: Return in about 3 months (around 07/31/2018) for procedure, evaluation.   Procedure: L5-S1 LESI with fluoroscopic guidance and with moderate sedation  NOTE: The risks, benefits, and expectations of the procedure have been discussed and explained to the patient who was understanding and in agreement with suggested treatment plan. No guarantees were made.  DESCRIPTION OF PROCEDURE: Lumbar epidural steroid injection with  IV Versed, EKG, blood pressure, pulse, and pulse oximetry monitoring. The procedure was performed with the patient in the prone position under fluoroscopic guidance.  Sterile prep x3 was initiated and I then injected subcutaneous lidocaine to the overlying L5 S1 site after its fluoroscopic identifictation.  Using strict aseptic technique, I then advanced an 18-gauge Tuohy epidural needle in the midline using  interlaminar approach via loss-of-resistance to saline technique. There was negative aspiration for heme or  CSF.  I then confirmed position with both AP and Lateral fluoroscan.  2 cc of Isovue were injected and a  total of 5 mL of Preservative-Free normal saline mixed with 40 mg of Kenalog and 1cc Ropicaine 0.2 percent were injected incrementally via the  epidurally placed needle. The needle was removed. The patient tolerated the injection well and was convalesced and discharged to home in stable condition. Should the patient have any post procedure difficulty they have been instructed on how to contact us for assistance.    Molli Barrows, MD

## 2018-04-30 NOTE — Patient Instructions (Signed)
Pain Management Discharge Instructions  General Discharge Instructions :  If you need to reach your doctor call: Monday-Friday 8:00 am - 4:00 pm at [phone] or toll free [phone].  After clinic hours [phone] to have operator reach doctor.  Bring all of your medication bottles to all your appointments in the pain clinic.  To cancel or reschedule your appointment with Pain Management please remember to call 24 hours in advance to avoid a fee.  Refer to the educational materials which you have been given on: General Risks, I had my Procedure. Discharge Instructions, Post Sedation.  Post Procedure Instructions:  The drugs you were given will stay in your system until tomorrow, so for the next 24 hours you should not drive, make any legal decisions or drink any alcoholic beverages.  You may eat anything you prefer, but it is better to start with liquids then soups and crackers, and gradually work up to solid foods.  Please notify your doctor immediately if you have any unusual bleeding, trouble breathing or pain that is not related to your normal pain.  Depending on the type of procedure that was done, some parts of your body may feel week and/or numb.  This usually clears up by tonight or the next day.  Walk with the use of an assistive device or accompanied by an adult for the 24 hours.  You may use ice on the affected area for the first 24 hours.  Put ice in a Ziploc bag and cover with a towel and place against area 15 minutes on 15 minutes off.  You may switch to heat after 24 hours.Epidural Steroid Injection Patient Information  Description: The epidural space surrounds the nerves as they exit the spinal cord.  In some patients, the nerves can be compressed and inflamed by a bulging disc or a tight spinal canal (spinal stenosis).  By injecting steroids into the epidural space, we can bring irritated nerves into direct contact with a potentially helpful medication.  These  steroids act directly on the irritated nerves and can reduce swelling and inflammation which often leads to decreased pain.  Epidural steroids may be injected anywhere along the spine and from the neck to the low back depending upon the location of your pain.   After numbing the skin with local anesthetic (like Novocaine), a small needle is passed into the epidural space slowly.  You may experience a sensation of pressure while this is being done.  The entire block usually last less than 10 minutes.  Conditions which may be treated by epidural steroids:   Low back and leg pain  Neck and arm pain  Spinal stenosis  Post-laminectomy syndrome  Herpes zoster (shingles) pain  Pain from compression fractures  Preparation for the injection:  1. Do not eat any solid food or dairy products within 8 hours of your appointment.  2. You may drink clear liquids up to 3 hours before appointment.  Clear liquids include water, black coffee, juice or soda.  No milk or cream please. 3. You may take your regular medication, including pain medications, with a sip of water before your appointment  Diabetics should hold regular insulin (if taken separately) and take 1/2 normal NPH dos the morning of the procedure.  Carry some sugar containing items with you to your appointment. 4. A driver must accompany you and be prepared to drive you home after your procedure.  5. Bring all your current medications with your. 6. An IV may be inserted and   sedation may be given at the discretion of the physician.   7. A blood pressure cuff, EKG and other monitors will often be applied during the procedure.  Some patients may need to have extra oxygen administered for a short period. 8. You will be asked to provide medical information, including your allergies, prior to the procedure.  We must know immediately if you are taking blood thinners (like Coumadin/Warfarin)  Or if you are allergic to IV iodine contrast (dye). We must  know if you could possible be pregnant.  Possible side-effects:  Bleeding from needle site  Infection (rare, may require surgery)  Nerve injury (rare)  Numbness & tingling (temporary)  Difficulty urinating (rare, temporary)  Spinal headache ( a headache worse with upright posture)  Light -headedness (temporary)  Pain at injection site (several days)  Decreased blood pressure (temporary)  Weakness in arm/leg (temporary)  Pressure sensation in back/neck (temporary)  Call if you experience:  Fever/chills associated with headache or increased back/neck pain.  Headache worsened by an upright position.  New onset weakness or numbness of an extremity below the injection site  Hives or difficulty breathing (go to the emergency room)  Inflammation or drainage at the infection site  Severe back/neck pain  Any new symptoms which are concerning to you  Please note:  Although the local anesthetic injected can often make your back or neck feel good for several hours after the injection, the pain will likely return.  It takes 3-7 days for steroids to work in the epidural space.  You may not notice any pain relief for at least that one week.  If effective, we will often do a series of three injections spaced 3-6 weeks apart to maximally decrease your pain.  After the initial series, we generally will wait several months before considering a repeat injection of the same type.  If you have any questions, please call [phone] [facility] 

## 2018-04-30 NOTE — Progress Notes (Signed)
Safety precautions to be maintained throughout the outpatient stay will include: orient to surroundings, keep bed in low position, maintain call bell within reach at all times, provide assistance with transfer out of bed and ambulation.  

## 2018-05-01 ENCOUNTER — Telehealth: Payer: Self-pay | Admitting: Nurse Practitioner

## 2018-05-01 ENCOUNTER — Telehealth: Payer: Self-pay | Admitting: *Deleted

## 2018-05-01 NOTE — Telephone Encounter (Signed)
Attempted to call for post procedure follow-up. No answer, unable to leave a message. 

## 2018-05-01 NOTE — Telephone Encounter (Signed)
Patient forgot to state she needs Lyrica and Methacarbamol, and meloxicam. Can this be called in please.

## 2018-05-04 ENCOUNTER — Other Ambulatory Visit: Payer: Self-pay | Admitting: Nurse Practitioner

## 2018-05-04 MED ORDER — PREGABALIN 75 MG PO CAPS: 150.0000 mg | ORAL_CAPSULE | Freq: Two times a day (BID) | ORAL | 1 refills | Status: DC

## 2018-05-04 NOTE — Telephone Encounter (Signed)
sent 

## 2018-05-14 ENCOUNTER — Other Ambulatory Visit: Payer: Self-pay | Admitting: Nurse Practitioner

## 2018-06-01 ENCOUNTER — Other Ambulatory Visit: Payer: Self-pay

## 2018-06-01 ENCOUNTER — Ambulatory Visit: Payer: Medicaid Other | Attending: Nurse Practitioner | Admitting: Nurse Practitioner

## 2018-06-01 DIAGNOSIS — M5030 Other cervical disc degeneration, unspecified cervical region: Secondary | ICD-10-CM | POA: Diagnosis not present

## 2018-06-01 DIAGNOSIS — M47816 Spondylosis without myelopathy or radiculopathy, lumbar region: Secondary | ICD-10-CM

## 2018-06-01 DIAGNOSIS — M542 Cervicalgia: Secondary | ICD-10-CM | POA: Diagnosis not present

## 2018-06-01 DIAGNOSIS — G8929 Other chronic pain: Secondary | ICD-10-CM

## 2018-06-01 DIAGNOSIS — M79641 Pain in right hand: Secondary | ICD-10-CM

## 2018-06-01 DIAGNOSIS — G894 Chronic pain syndrome: Secondary | ICD-10-CM

## 2018-06-01 MED ORDER — OXYCODONE HCL 5 MG PO TABS: 5.0000 mg | ORAL_TABLET | ORAL | 0 refills | Status: AC | PRN

## 2018-06-01 MED ORDER — METHOCARBAMOL 750 MG PO TABS: 750.0000 mg | ORAL_TABLET | Freq: Three times a day (TID) | ORAL | 1 refills | Status: AC

## 2018-06-01 MED ORDER — PREGABALIN 75 MG PO CAPS: 150.0000 mg | ORAL_CAPSULE | Freq: Two times a day (BID) | ORAL | 1 refills | Status: DC

## 2018-06-01 MED ORDER — OXYCODONE HCL 5 MG PO CAPS: 5.0000 mg | ORAL_CAPSULE | ORAL | 0 refills | Status: AC | PRN

## 2018-06-01 NOTE — Progress Notes (Signed)
Pain Management Encounter Note - Virtual Visit via Telephone Telehealth (real-time audio visits between healthcare provider and patient).  Patient's Phone No. & Preferred Pharmacy:  [phone] (home); [phone] (mobile); (Preferred) [facility] ([facility] [place] [facility] [place] ([place]) [place] [address] Phone: [phone] Fax: [phone]   Pre-screening note:  Our staff contacted Hannah Maynard and offered her an "in person", "face-to-face" appointment versus a telephone encounter. She indicated preferring the telephone encounter, at this time.  Reason for Virtual Visit: COVID-19*  Social distancing based on CDC and AMA recommendations.   I contacted Hannah Maynard on 06/01/2018 at 8:44 AM by telephone and clearly identified myself as Dionisio David, NP. I verified that I was speaking with the correct person using two identifiers (Name and date of birth: 25-Jul-1965).  Advanced Informed Consent I sought verbal advanced consent from Hannah Maynard for telemedicine interactions and virtual visit. I informed Hannah Maynard of the security and privacy concerns, risks, and limitations associated with performing an evaluation and management service by telephone. I also informed Hannah Maynard of the availability of "in person" appointments and I informed her of the possibility of a patient responsible charge related to this service. Hannah Maynard expressed understanding and agreed to proceed.   Historic Elements   Hannah Maynard is a 53 y.o. year old, female patient evaluated today after her last encounter by our practice on 05/14/2018. Hannah Maynard  has a past medical history of Anxiety, Chronic back pain, and Depression. She also  has a past surgical history that includes Lipoma excision. Hannah Maynard has a current medication list which includes the following prescription(s): ibuprofen, meloxicam, methocarbamol, zewa tens/ems combo unit,  oxycodone, oxycodone, and pregabalin, and the following Facility-Administered Medications: lidocaine (pf) and ropivacaine (pf) 2 mg/ml (0.2%). She  reports that she has been smoking cigarettes. She has never used smokeless tobacco. She reports that she does not drink alcohol or use drugs. Hannah Maynard is allergic to duloxetine and penicillins.   HPI  I last saw her on 05/14/2018. She is being evaluated for medication management. She rates her pain a 6 /10. She is treated for low back pain. She did not get a lot of relief out of her last injection. She has some pressure in her thighs. She admits that this has improved. She has some weakness in her right leg with going up steps. She has to sit with washing dishes. She rest in between household chores. She denies any side effects.  Pharmacotherapy Assessment  Analgesic:Oxycodone 5 mg 4 times daily MME/day:30mg /day.   Monitoring: Pharmacotherapy: No side-effects or adverse reactions reported. [place] PMP: PDMP not reviewed this encounter.       Compliance: No problems identified. Plan: Refer to "POC".  Review of recent tests  DG C-Arm 1-60 Min-No Report Fluoroscopy was utilized by the requesting physician.  No radiographic  interpretation.    Clinical Support on 10/28/2017  Component Date Value Ref Range Status  . Summary 10/28/2017 FINAL   Final   Comment: ==================================================================== TOXASSURE SELECT 13 (MW) ==================================================================== Test                             Result       Flag       Units Drug Present and Declared for Prescription Verification   Oxycodone  745          EXPECTED   ng/mg creat   Oxymorphone                    267          EXPECTED   ng/mg creat   Noroxycodone                   779          EXPECTED   ng/mg creat   Noroxymorphone                 86           EXPECTED   ng/mg creat    Sources of oxycodone are scheduled  prescription medications.    Oxymorphone, noroxycodone, and noroxymorphone are expected    metabolites of oxycodone. Oxymorphone is also available as a    scheduled prescription medication. ==================================================================== Test                      Result    Flag   Units      Ref Range   Creatinine              168              mg/dL      >=20 ======                          ============================================================== Declared Medications:  The flagging and interpretation on this report are based on the  following declared medications.  Unexpected results may arise from  inaccuracies in the declared medications.  **Note: The testing scope of this panel includes these medications:  Oxycodone  **Note: The testing scope of this panel does not include following  reported medications:  Ibuprofen  Meloxicam  Methocarbamol  Pregabalin ==================================================================== For clinical consultation, please call [phone]. ====================================================================   . Glucose 10/28/2017 108* 65 - 99 mg/dL Final  . BUN 10/28/2017 9  6 - 24 mg/dL Final  . Creatinine, Ser 10/28/2017 0.85  0.57 - 1.00 mg/dL Final  . GFR calc non Af Amer 10/28/2017 78  >59 mL/min/1.73 Final  . GFR calc Af Amer 10/28/2017 90  >59 mL/min/1.73 Final  . BUN/Creatinine Ratio 10/28/2017 11  9 - 23 Final  . Sodium 10/28/2017 142  134 - 144 mmol/L Final  . Potassium 10/28/2017 4.4  3.5 - 5.2 mmol/L Final  . Chloride 10/28/2017 107* 96 - 106 mmol/L Final  . Calcium 10/28/2017 9.7  8.7 - 10.2 mg/dL Final  . Total Protein 10/28/2017 7.5  6.0 - 8.5 g/dL Final  . Albumin 10/28/2017 4.2  3.5 - 5.5 g/dL Final  . Globulin, Total 10/28/2017 3.3  1.5 - 4.5 g/dL Final  . Albumin/Globulin Ratio 10/28/2017 1.3  1.2 - 2.2 Final  . Bilirubin Total 10/28/2017 0.5  0.0 - 1.2 mg/dL Final  . Alkaline Phosphatase  10/28/2017 128* 39 - 117 IU/L Final  . AST 10/28/2017 49* 0 - 40 IU/L Final   Assessment  There were no encounter diagnoses.  Plan of Care  I am having Hannah Maynard maintain her ibuprofen, Zewa TENS/EMS Combo Unit, meloxicam, methocarbamol, pregabalin, oxyCODONE, and oxycodone. We will continue to administer ropivacaine (PF) 2 mg/mL (0.2%) and lidocaine (PF).  Pharmacotherapy (Medications Ordered): Meds ordered this encounter  Medications  . methocarbamol (ROBAXIN) 750 MG tablet    Sig: Take 1  tablet (750 mg total) by mouth 3 (three) times daily.    Dispense:  90 tablet    Refill:  1    Order Specific Question:   Supervising Provider    Answer:   Molli Barrows [[id number]]  . pregabalin (LYRICA) 75 MG capsule    Sig: Take 2 capsules (150 mg total) by mouth 2 (two) times daily.    Dispense:  120 capsule    Refill:  1    Order Specific Question:   Supervising Provider    Answer:   Molli Barrows [[id number]]  . oxyCODONE (ROXICODONE) 5 MG immediate release tablet    Sig: Take 1 tablet (5 mg total) by mouth every 4 (four) hours as needed for up to 30 days for severe pain.    Dispense:  150 tablet    Refill:  0    30 day supply no early refill    Order Specific Question:   Supervising Provider    Answer:   Molli Barrows [[id number]]  . oxycodone (OXY-IR) 5 MG capsule    Sig: Take 1 capsule (5 mg total) by mouth every 4 (four) hours as needed for up to 30 days.    Dispense:  150 capsule    Refill:  0    Do not place this medication, or any other prescription from our practice, on "Automatic Refill". Patient may have prescription filled one day early if pharmacy is closed on scheduled refill date.    Order Specific Question:   Supervising Provider    Answer:   Molli Barrows [[id number]]   Orders:  No orders of the defined types were placed in this encounter.  Follow-up plan:   No follow-ups on file.   I discussed the assessment and treatment plan with the patient. The patient was provided an  opportunity to ask questions and all were answered. The patient agreed with the plan and demonstrated an understanding of the instructions.  Patient advised to call back or seek an in-person evaluation if the symptoms or condition worsens.  Total duration of non-face-to-face encounter: 14 minutes.  Note by: Dionisio David, NP Date: 06/01/2018; Time: 8:44 AM  Disclaimer:  * Given the special circumstances of the COVID-19 pandemic, the federal government has announced that the Office for Civil Rights (OCR) will exercise its enforcement discretion and will not impose penalties on physicians using telehealth in the event of noncompliance with regulatory requirements under the [place] (HIPAA) in connection with the good faith provision of telehealth during the TGYBW-38 national public health emergency. ([place])

## 2018-07-27 ENCOUNTER — Ambulatory Visit: Payer: Medicaid Other | Attending: Anesthesiology | Admitting: Anesthesiology

## 2018-07-27 ENCOUNTER — Other Ambulatory Visit: Payer: Self-pay

## 2018-07-27 DIAGNOSIS — G894 Chronic pain syndrome: Secondary | ICD-10-CM | POA: Diagnosis not present

## 2018-07-27 DIAGNOSIS — Z79891 Long term (current) use of opiate analgesic: Secondary | ICD-10-CM

## 2018-07-27 DIAGNOSIS — M5030 Other cervical disc degeneration, unspecified cervical region: Secondary | ICD-10-CM | POA: Diagnosis not present

## 2018-07-27 DIAGNOSIS — M5441 Lumbago with sciatica, right side: Secondary | ICD-10-CM

## 2018-07-27 DIAGNOSIS — R29898 Other symptoms and signs involving the musculoskeletal system: Secondary | ICD-10-CM

## 2018-07-27 DIAGNOSIS — M47816 Spondylosis without myelopathy or radiculopathy, lumbar region: Secondary | ICD-10-CM | POA: Diagnosis not present

## 2018-07-27 DIAGNOSIS — M5442 Lumbago with sciatica, left side: Secondary | ICD-10-CM

## 2018-07-27 DIAGNOSIS — M5431 Sciatica, right side: Secondary | ICD-10-CM

## 2018-07-27 DIAGNOSIS — G8929 Other chronic pain: Secondary | ICD-10-CM

## 2018-07-27 MED ORDER — OXYCODONE HCL 5 MG PO TABS: 5.0000 mg | ORAL_TABLET | Freq: Four times a day (QID) | ORAL | 0 refills | Status: AC | PRN

## 2018-07-27 NOTE — Progress Notes (Signed)
Virtual Visit via Telephone Note  I connected with Hannah Maynard on 07/27/18 at  1:15 PM EDT by telephone and verified that I am speaking with the correct person using two identifiers.  Location: Patient: Home Provider: Pain control center   I discussed the limitations, risks, security and privacy concerns of performing an evaluation and management service by telephone and the availability of in person appointments. I also discussed with the patient that there may be a patient responsible charge related to this service. The patient expressed understanding and agreed to proceed.   History of Present Illness: I spoke with [name] via telephone conversation.  We were unable to achieve visual virtual conversation.  Based on our discussion today she reports that she is having some more right greater than left lower extremity pain with pain in the calf.  She achieved good relief of pain with her last epidural a few months ago and this pain has recurred.  She feels that the COVID crisis has caused some exacerbation and she has been unable to be as active.  She is taking her medications as prescribed and these have continued to work well.  No other changes are reported at this time.  Her bowel and bladder function have been stable and strength is been stable.  She has been taking her medications without difficulty and no side effects are    Observations/Objective: Forwarded  Current Outpatient Medications:  .  ibuprofen (ADVIL,MOTRIN) 200 MG tablet, Take 200 mg by mouth every 6 (six) hours as needed., Disp: , Rfl:  .  meloxicam (MOBIC) 7.5 MG tablet, Take 1 tablet (7.5 mg total) by mouth 2 (two) times daily., Disp: 60 tablet, Rfl: 1 .  methocarbamol (ROBAXIN) 750 MG tablet, Take 1 tablet (750 mg total) by mouth 3 (three) times daily., Disp: 90 tablet, Rfl: 1 .  Nerve Stimulator (ZEWA TENS/EMS COMBO UNIT) DEVI, by Does not apply route., Disp: , Rfl:  .  oxyCODONE (ROXICODONE) 5 MG immediate release  tablet, Take 1 tablet (5 mg total) by mouth every 4 (four) hours as needed for up to 30 days for severe pain., Disp: 150 tablet, Rfl: 0 .  pregabalin (LYRICA) 75 MG capsule, Take 2 capsules (150 mg total) by mouth 2 (two) times daily., Disp: 120 capsule, Rfl: 1  Current Facility-Administered Medications:  .  lidocaine (PF) (XYLOCAINE) 1 % injection 5 mL, 5 mL, Subcutaneous, Once, Molli Barrows, MD .  ropivacaine (PF) 2 mg/mL (0.2%) (NAROPIN) injection 10 mL, 10 mL, Epidural, Once, Andree Elk Alvina Filbert, MD   Assessment and Plan: 1. Lumbar spondylosis   2. DDD (degenerative disc disease), cervical   3. Chronic pain syndrome   4. Long term current use of opiate analgesic   5. Weakness of both lower extremities   6. Facet arthritis of lumbar region   7. Chronic bilateral low back pain with bilateral sciatica   8. Right sided sciatica   Based on our discussion today and upon review of the [facility] practitioner database information I am going to refill her medications.  This will be dated for June 9 and July 9.  In the meantime I want her to continue with her stretching strengthening exercises I am scheduling her for return to clinic in approximately 4 weeks to 6 weeks for an epidural steroid injection to see if this can help alleviate some of her pain symptoms.  These have helped in the past.  She is also to continue follow-up with her primary  care physicians for her baseline medical care.   Follow Up Instructions: Return to clinic in 4 to 6 weeks    I discussed the assessment and treatment plan with the patient. The patient was provided an opportunity to ask questions and all were answered. The patient agreed with the plan and demonstrated an understanding of the instructions.   The patient was advised to call back or seek an in-person evaluation if the symptoms worsen or if the condition fails to improve as anticipated.  I provided 20 minutes of non-face-to-face time during this  encounter.   Molli Barrows, MD

## 2018-07-29 ENCOUNTER — Encounter: Payer: Medicaid Other | Admitting: Nurse Practitioner

## 2018-08-05 ENCOUNTER — Telehealth: Payer: Self-pay | Admitting: *Deleted

## 2018-08-05 NOTE — Telephone Encounter (Signed)
Pt called back stating they do have her pain medication and she instead needs a refill of lyrica

## 2018-08-05 NOTE — Telephone Encounter (Signed)
Message sent to Dr Andree Elk for refill of Lyrica.

## 2018-08-12 ENCOUNTER — Telehealth: Payer: Self-pay

## 2018-08-12 ENCOUNTER — Telehealth: Payer: Self-pay | Admitting: *Deleted

## 2018-08-12 NOTE — Telephone Encounter (Signed)
Patients Lyrica was not ordered on 07/27/18 can you order this for her please.

## 2018-08-12 NOTE — Telephone Encounter (Signed)
Called and left message that Dr Andree Elk did not order her Lyrica on 07/27/2018 and that I sent a message to dr Andree Elk to send to her Pharmacy.

## 2018-08-13 ENCOUNTER — Other Ambulatory Visit: Payer: Self-pay | Admitting: *Deleted

## 2018-08-13 ENCOUNTER — Telehealth: Payer: Self-pay

## 2018-08-13 MED ORDER — PREGABALIN 75 MG PO CAPS: 150.0000 mg | ORAL_CAPSULE | Freq: Two times a day (BID) | ORAL | 1 refills | Status: DC

## 2018-08-13 NOTE — Telephone Encounter (Signed)
Her Lyrica has still not been called out to [facility]. She was supposed to get it 08/02/18.

## 2018-08-13 NOTE — Telephone Encounter (Signed)
Called Dr. Andree Elk, ok to call in script for Lyrica. Patient notified script has been called in.

## 2018-09-07 ENCOUNTER — Telehealth: Payer: Self-pay | Admitting: Anesthesiology

## 2018-09-07 ENCOUNTER — Telehealth: Payer: Self-pay | Admitting: *Deleted

## 2018-09-07 NOTE — Telephone Encounter (Signed)
PA was done on 09-03-2018.  Patient notified.

## 2018-09-07 NOTE — Telephone Encounter (Signed)
Patient called stating her pharmacy told her we have to call medicaid for PA on her medications. Was supposed to pick up meds Friday

## 2018-09-08 ENCOUNTER — Telehealth: Payer: Self-pay | Admitting: Anesthesiology

## 2018-09-08 NOTE — Telephone Encounter (Signed)
Called patient and informed her that it was sent. Resent PA to [facility]

## 2018-09-08 NOTE — Telephone Encounter (Addendum)
Patient called pharmacy and they have not heard from [place]. They are telling her we need to call Medicaid. She is in extreme pain. Please check and let her know status. She wants to come in for a shot.

## 2018-10-05 ENCOUNTER — Ambulatory Visit
Admission: RE | Admit: 2018-10-05 | Discharge: 2018-10-05 | Disposition: A | Discharge status: Home / Self Care | Payer: Medicaid Other | Source: Ambulatory Visit | Attending: Anesthesiology | Admitting: Anesthesiology

## 2018-10-05 ENCOUNTER — Encounter: Payer: Self-pay | Admitting: Anesthesiology

## 2018-10-05 ENCOUNTER — Other Ambulatory Visit: Payer: Self-pay

## 2018-10-05 ENCOUNTER — Other Ambulatory Visit: Payer: Self-pay | Admitting: Anesthesiology

## 2018-10-05 ENCOUNTER — Encounter (INDEPENDENT_AMBULATORY_CARE_PROVIDER_SITE_OTHER): Payer: Self-pay

## 2018-10-05 ENCOUNTER — Ambulatory Visit: Payer: Medicaid Other | Admitting: Anesthesiology

## 2018-10-05 VITALS — BP 155/84 | HR 83 | Temp 98.5°F | Resp 15 | Ht 65.5 in | Wt 224.6 lb

## 2018-10-05 DIAGNOSIS — G894 Chronic pain syndrome: Secondary | ICD-10-CM

## 2018-10-05 DIAGNOSIS — M5431 Sciatica, right side: Secondary | ICD-10-CM | POA: Insufficient documentation

## 2018-10-05 DIAGNOSIS — R29898 Other symptoms and signs involving the musculoskeletal system: Secondary | ICD-10-CM | POA: Diagnosis present

## 2018-10-05 DIAGNOSIS — M5030 Other cervical disc degeneration, unspecified cervical region: Secondary | ICD-10-CM

## 2018-10-05 DIAGNOSIS — R52 Pain, unspecified: Secondary | ICD-10-CM

## 2018-10-05 DIAGNOSIS — M542 Cervicalgia: Secondary | ICD-10-CM | POA: Diagnosis present

## 2018-10-05 DIAGNOSIS — M47816 Spondylosis without myelopathy or radiculopathy, lumbar region: Secondary | ICD-10-CM | POA: Insufficient documentation

## 2018-10-05 MED ORDER — SODIUM CHLORIDE (PF) 0.9 % IJ SOLN
INTRAMUSCULAR | Status: AC
  Filled 2018-10-05: qty 10

## 2018-10-05 MED ORDER — LIDOCAINE HCL (PF) 1 % IJ SOLN
INTRAMUSCULAR | Status: AC
  Filled 2018-10-05: qty 5

## 2018-10-05 MED ORDER — MIDAZOLAM HCL 5 MG/5ML IJ SOLN
5.0000 mg | Freq: Once | INTRAMUSCULAR | Status: AC
  Administered 2018-10-05: 2 mg via INTRAVENOUS

## 2018-10-05 MED ORDER — SODIUM CHLORIDE 0.9% FLUSH
10.0000 mL | Freq: Once | INTRAVENOUS | Status: AC
  Administered 2018-10-05: 5 mL

## 2018-10-05 MED ORDER — TRIAMCINOLONE ACETONIDE 40 MG/ML IJ SUSP
INTRAMUSCULAR | Status: AC
  Filled 2018-10-05: qty 1

## 2018-10-05 MED ORDER — MIDAZOLAM HCL 5 MG/5ML IJ SOLN
INTRAMUSCULAR | Status: AC
  Filled 2018-10-05: qty 5

## 2018-10-05 MED ORDER — ROPIVACAINE HCL 2 MG/ML IJ SOLN
INTRAMUSCULAR | Status: AC
  Filled 2018-10-05: qty 10

## 2018-10-05 MED ORDER — OXYCODONE HCL 5 MG PO TABS: 5.0000 mg | ORAL_TABLET | Freq: Four times a day (QID) | ORAL | 0 refills | Status: DC | PRN

## 2018-10-05 MED ORDER — OXYCODONE HCL 5 MG PO CAPS: 5.0000 mg | ORAL_CAPSULE | ORAL | 0 refills | Status: AC | PRN

## 2018-10-05 MED ORDER — LIDOCAINE HCL (PF) 1 % IJ SOLN
5.0000 mL | Freq: Once | INTRAMUSCULAR | Status: AC
  Administered 2018-10-05: 16:00:00 5 mL via SUBCUTANEOUS
  Filled 2018-10-05: qty 5

## 2018-10-05 MED ORDER — LACTATED RINGERS IV SOLN
1000.0000 mL | INTRAVENOUS | Status: DC
  Administered 2018-10-05: 1000 mL via INTRAVENOUS

## 2018-10-05 MED ORDER — IOPAMIDOL (ISOVUE-M 200) INJECTION 41%: 20.0000 mL | Freq: Once | INTRAMUSCULAR | Status: DC | PRN

## 2018-10-05 MED ORDER — TRIAMCINOLONE ACETONIDE 40 MG/ML IJ SUSP
40.0000 mg | Freq: Once | INTRAMUSCULAR | Status: AC
  Administered 2018-10-05: 40 mg

## 2018-10-05 MED ORDER — ROPIVACAINE HCL 2 MG/ML IJ SOLN
10.0000 mL | Freq: Once | INTRAMUSCULAR | Status: AC
  Administered 2018-10-05: 10 mL via EPIDURAL

## 2018-10-05 NOTE — Patient Instructions (Signed)

## 2018-10-05 NOTE — Progress Notes (Signed)
Nursing Pain Medication Assessment:  Safety precautions to be maintained throughout the outpatient stay will include: orient to surroundings, keep bed in low position, maintain call bell within reach at all times, provide assistance with transfer out of bed and ambulation.  Medication Inspection Compliance: Ms. Shupert did not comply with our request to bring her pills to be counted. She was reminded that bringing the medication bottles, even when empty, is a requirement.  Medication: None brought in. Pill/Patch Count: None available to be counted. Bottle Appearance: No container available. Did not bring bottle(s) to appointment. Filled Date: N/A Last Medication intake:  Today 

## 2018-10-05 NOTE — Progress Notes (Signed)
Subjective:  Patient ID: Hannah Maynard, female    DOB: March 02, 1965  Age: 53 y.o. MRN: [id number]  CC: Back Pain (lumbar right is worse )   Procedure: L5-S1 epidural under fluoroscopic guidance with moderate sedation  HPI Hannah Maynard presents for reevaluation.  It has been several months since her last epidural injection.  She gets significant improvement in her low back pain and posterior bilateral calf pain with the injections.  Unfortunately she is failed conservative therapy and physical therapy exercise therapy and medication management alone has been insufficient to keep her pain under control.  Over the past several weeks she has had recurrence of bilateral calf pain and leg pain worse with prolonged standing or any type of activity.  This is consistent with what she has experienced in the past and in the past she has responded favorably to epidural injections.  She still takes her medications as prescribed and these continue to give her good relief throughout the day but she breaks through with this current regimen.  Based on the nursing assessment sheet and narcotic review sheet she continues to derive good functional lifestyle improvement with no side effects with the medications.  Otherwise she is in her usual state of health this time.  Outpatient Medications Prior to Visit  Medication Sig Dispense Refill  . ibuprofen (ADVIL,MOTRIN) 200 MG tablet Take 200 mg by mouth every 6 (six) hours as needed.    . meloxicam (MOBIC) 7.5 MG tablet Take 1 tablet (7.5 mg total) by mouth 2 (two) times daily. 60 tablet 1  . methocarbamol (ROBAXIN) 750 MG tablet Take 750 mg by mouth 3 (three) times daily.    . montelukast (SINGULAIR) 10 MG tablet Take 10 mg by mouth at bedtime.    . Nerve Stimulator (ZEWA TENS/EMS COMBO UNIT) DEVI by Does not apply route.    . pregabalin (LYRICA) 75 MG capsule Take 2 capsules (150 mg total) by mouth 2 (two) times daily. 120 capsule 1  . PROAIR HFA 108 (90 Base)  MCG/ACT inhaler Inhale 2 puffs into the lungs as needed.    . sertraline (ZOLOFT) 50 MG tablet Take 50 mg by mouth daily.    Marland Kitchen oxycodone (OXY-IR) 5 MG capsule Take 5 mg by mouth every 4 (four) hours as needed.     Facility-Administered Medications Prior to Visit  Medication Dose Route Frequency Provider Last Rate Last Dose  . lidocaine (PF) (XYLOCAINE) 1 % injection 5 mL  5 mL Subcutaneous Once Molli Barrows, MD      . ropivacaine (PF) 2 mg/mL (0.2%) (NAROPIN) injection 10 mL  10 mL Epidural Once Molli Barrows, MD        Review of Systems CNS: No confusion or sedation Cardiac: No angina or palpitations GI: No abdominal pain or constipation Constitutional: No nausea vomiting fevers or chills  Objective:  BP (!) 155/84   Pulse 83   Temp 98.5 F (36.9 C) (Oral)   Resp 15   Ht 5' 5.5" (1.664 m)   Wt 224 lb 9.6 oz (101.9 kg)   LMP  (LMP Unknown)   SpO2 99%   BMI 36.81 kg/m    BP Readings from Last 3 Encounters:  10/05/18 (!) 155/84  04/30/18 (!) 190/97  04/01/18 (!) 153/96     Wt Readings from Last 3 Encounters:  10/05/18 224 lb 9.6 oz (101.9 kg)  04/30/18 238 lb (108 kg)  04/01/18 238 lb 6.4 oz (108.1 kg)     Physical  Exam Pt is alert and oriented PERRL EOMI HEART IS RRR no murmur or rub LCTA no wheezing or rales MUSCULOSKELETAL reveals some paraspinous muscle tenderness in the lumbar region but no overt trigger points.  She walks with a mildly antalgic gait and has a positive straight leg raise on the right side negative on the left.  Her muscle tone and bulk is at baseline  Labs  No results found for: HGBA1C Lab Results  Component Value Date   CREATININE 0.85 10/28/2017    -------------------------------------------------------------------------------------------------------------------- Lab Results  Component Value Date   GLUCOSE 108 (H) 10/28/2017   AST 49 (H) 10/28/2017   NA 142 10/28/2017   K 4.4 10/28/2017   CL 107 (H) 10/28/2017   CREATININE  0.85 10/28/2017   BUN 9 10/28/2017    --------------------------------------------------------------------------------------------------------------------- [facility] C-arm 1-60 Min No Report  Result Date: 10/05/2018 Fluoro was used, but no Radiologist interpretation will be provided. Please refer to "NOTES" tab for provider progress note.    Assessment & Plan:   Hannah Maynard was seen today for back pain.  Diagnoses and all orders for this visit:  Facet arthritis of lumbar region  Lumbar spondylosis -     Lumbar Epidural Injection -     Lumbar Epidural Injection; Future  DDD (degenerative disc disease), cervical -     Lumbar Epidural Injection -     Lumbar Epidural Injection; Future  Right sided sciatica -     Lumbar Epidural Injection -     Lumbar Epidural Injection; Future  Cervicalgia  Chronic pain syndrome -     oxycodone (OXY-IR) 5 MG capsule; Take 1 capsule (5 mg total) by mouth every 4 (four) hours as needed.  Weakness of both lower extremities  Other orders -     oxyCODONE (ROXICODONE) 5 MG immediate release tablet; Take 1 tablet (5 mg total) by mouth every 6 (six) hours as needed for severe pain. -     triamcinolone acetonide (KENALOG-40) injection 40 mg -     sodium chloride flush (NS) 0.9 % injection 10 mL -     ropivacaine (PF) 2 mg/mL (0.2%) (NAROPIN) injection 10 mL -     midazolam (VERSED) 5 MG/5ML injection 5 mg -     lidocaine (PF) (XYLOCAINE) 1 % injection 5 mL -     lactated ringers infusion 1,000 mL -     iopamidol (ISOVUE-M) 41 % intrathecal injection 20 mL        ----------------------------------------------------------------------------------------------------------------------  Problem List Items Addressed This Visit      Unprioritized   Cervicalgia   Chronic pain syndrome   Relevant Medications   methocarbamol (ROBAXIN) 750 MG tablet   sertraline (ZOLOFT) 50 MG tablet   oxycodone (OXY-IR) 5 MG capsule (Start on 10/08/2018)    oxyCODONE (ROXICODONE) 5 MG immediate release tablet (Start on 11/07/2018)   DDD (degenerative disc disease), cervical   Relevant Medications   methocarbamol (ROBAXIN) 750 MG tablet   oxycodone (OXY-IR) 5 MG capsule (Start on 10/08/2018)   oxyCODONE (ROXICODONE) 5 MG immediate release tablet (Start on 11/07/2018)   Other Relevant Orders   Lumbar Epidural Injection   Lumbar spondylosis   Relevant Medications   methocarbamol (ROBAXIN) 750 MG tablet   oxycodone (OXY-IR) 5 MG capsule (Start on 10/08/2018)   oxyCODONE (ROXICODONE) 5 MG immediate release tablet (Start on 11/07/2018)   Other Relevant Orders   Lumbar Epidural Injection    Other Visit Diagnoses    Facet arthritis of lumbar region    -  Primary   Relevant Medications   methocarbamol (ROBAXIN) 750 MG tablet   oxycodone (OXY-IR) 5 MG capsule (Start on 10/08/2018)   oxyCODONE (ROXICODONE) 5 MG immediate release tablet (Start on 11/07/2018)   triamcinolone acetonide (KENALOG-40) injection 40 mg (Completed)   Right sided sciatica       Relevant Medications   methocarbamol (ROBAXIN) 750 MG tablet   sertraline (ZOLOFT) 50 MG tablet   midazolam (VERSED) 5 MG/5ML injection 5 mg (Completed)   Other Relevant Orders   Lumbar Epidural Injection   Weakness of both lower extremities            ----------------------------------------------------------------------------------------------------------------------  1. Lumbar spondylosis  - Lumbar Epidural Injection - Lumbar Epidural Injection; Future  2. DDD (degenerative disc disease), cervical Encouraged her to continue with some upper extremity exercises for her cervical neck pain.  This seems to be under stable condition at present.  She does have some mild numbness occasionally over the dorsum of the right hand with no weakness noted.  If this intensifies she is instructed to contact the pain control center for further evaluation. - Lumbar Epidural Injection - Lumbar Epidural  Injection; Future  3. Right sided sciatica We will proceed with an epidural steroid injection as she has responded to these favorably and had good results in the past.  I want her to continue with efforts at weight loss and intensify her attempts at core strengthening.  We will schedule her for return to clinic in 1 to 2 months for reevaluation possible repeat injection if indicated at that time.  I am also getting keep her on her current medication regimen therapy which seems to be working well for her with no side effects reported.  We have gone over the risks and benefits of the procedure with her in full detail and all questions have been answered - Lumbar Epidural Injection - Lumbar Epidural Injection; Future  4. Facet arthritis of lumbar region .  Continue core stretching strengthening exercises.  5. Cervicalgia As above  6. Chronic pain syndrome I have reviewed the [facility] practitioner database information and it is appropriate. - oxycodone (OXY-IR) 5 MG capsule; Take 1 capsule (5 mg total) by mouth every 4 (four) hours as needed.  Dispense: 120 capsule; Refill: 0  7. Weakness of both lower extremities Continue stretching strengthening exercises.  Her strength is stable as per previous evaluation    ----------------------------------------------------------------------------------------------------------------------  I have changed Hannah Maynard's oxycodone. I am also having her start on oxyCODONE. Additionally, I am having her maintain her ibuprofen, [place] TENS/EMS Combo Unit, meloxicam, pregabalin, ProAir HFA, methocarbamol, montelukast, and sertraline. We administered triamcinolone acetonide, sodium chloride flush, ropivacaine (PF) 2 mg/mL (0.2%), midazolam, lidocaine (PF), and lactated ringers. We will continue to administer ropivacaine (PF) 2 mg/mL (0.2%) and lidocaine (PF).   Meds ordered this encounter  Medications  . oxycodone (OXY-IR) 5 MG capsule    Sig: Take  1 capsule (5 mg total) by mouth every 4 (four) hours as needed.    Dispense:  120 capsule    Refill:  0  . oxyCODONE (ROXICODONE) 5 MG immediate release tablet    Sig: Take 1 tablet (5 mg total) by mouth every 6 (six) hours as needed for severe pain.    Dispense:  120 tablet    Refill:  0  . triamcinolone acetonide (KENALOG-40) injection 40 mg  . sodium chloride flush (NS) 0.9 % injection 10 mL  . ropivacaine (PF) 2 mg/mL (0.2%) (NAROPIN) injection 10 mL  .  midazolam (VERSED) 5 MG/5ML injection 5 mg  . lidocaine (PF) (XYLOCAINE) 1 % injection 5 mL  . lactated ringers infusion 1,000 mL  . iopamidol (ISOVUE-M) 41 % intrathecal injection 20 mL   Patient's Medications  New Prescriptions   OXYCODONE (ROXICODONE) 5 MG IMMEDIATE RELEASE TABLET    Take 1 tablet (5 mg total) by mouth every 6 (six) hours as needed for severe pain.  Previous Medications   IBUPROFEN (ADVIL,MOTRIN) 200 MG TABLET    Take 200 mg by mouth every 6 (six) hours as needed.   MELOXICAM (MOBIC) 7.5 MG TABLET    Take 1 tablet (7.5 mg total) by mouth 2 (two) times daily.   METHOCARBAMOL (ROBAXIN) 750 MG TABLET    Take 750 mg by mouth 3 (three) times daily.   MONTELUKAST (SINGULAIR) 10 MG TABLET    Take 10 mg by mouth at bedtime.   NERVE STIMULATOR (ZEWA TENS/EMS COMBO UNIT) DEVI    by Does not apply route.   PREGABALIN (LYRICA) 75 MG CAPSULE    Take 2 capsules (150 mg total) by mouth 2 (two) times daily.   PROAIR HFA 108 (90 BASE) MCG/ACT INHALER    Inhale 2 puffs into the lungs as needed.   SERTRALINE (ZOLOFT) 50 MG TABLET    Take 50 mg by mouth daily.  Modified Medications   Modified Medication Previous Medication   OXYCODONE (OXY-IR) 5 MG CAPSULE oxycodone (OXY-IR) 5 MG capsule      Take 1 capsule (5 mg total) by mouth every 4 (four) hours as needed.    Take 5 mg by mouth every 4 (four) hours as needed.  Discontinued Medications   No medications on file    ----------------------------------------------------------------------------------------------------------------------  Follow-up: Return in about 2 months (around 12/05/2018) for procedure.   Procedure: L5-S1 LESI with fluoroscopic guidance and with moderate sedation  NOTE: The risks, benefits, and expectations of the procedure have been discussed and explained to the patient who was understanding and in agreement with suggested treatment plan. No guarantees were made.  DESCRIPTION OF PROCEDURE: Lumbar epidural steroid injection with 3 IV Versed, EKG, blood pressure, pulse, and pulse oximetry monitoring. The procedure was performed with the patient in the prone position under fluoroscopic guidance.  Sterile prep x3 was initiated and I then injected subcutaneous lidocaine to the overlying L5-S1 site after its fluoroscopic identifictation.  Using strict aseptic technique, I then advanced an 18-gauge Tuohy epidural needle in the midline using interlaminar approach via loss-of-resistance to saline technique. There was negative aspiration for heme or  CSF.  I then confirmed position with both AP and Lateral fluoroscan.  2 cc  of contrast dye were injected and a  total of 5 mL of Preservative-Free normal saline mixed with 40 mg of Kenalog and 1cc Ropicaine 0.2 percent were injected incrementally via the  epidurally placed needle. The needle was removed. The patient tolerated the injection well and was convalesced and discharged to home in stable condition. Should the patient have any post procedure difficulty they have been instructed on how to contact us for assistance.    Molli Barrows, MD

## 2018-10-06 ENCOUNTER — Telehealth: Payer: Self-pay | Admitting: *Deleted

## 2018-10-06 ENCOUNTER — Telehealth: Payer: Self-pay | Admitting: Anesthesiology

## 2018-10-06 NOTE — Telephone Encounter (Signed)
Spoke with pharmacist, ok to change to tabs

## 2018-10-06 NOTE — Telephone Encounter (Signed)
Pharmacy lvmail stating they received script for Oxycodone in capsule and want to know if it is ok to change to tablets. Please call and let them know

## 2018-10-06 NOTE — Telephone Encounter (Signed)
Attempted to call for post procedure follow-up. Message left. 

## 2018-10-20 ENCOUNTER — Other Ambulatory Visit: Payer: Self-pay | Admitting: Anesthesiology

## 2018-10-28 ENCOUNTER — Telehealth: Payer: Self-pay | Admitting: Anesthesiology

## 2018-10-28 NOTE — Telephone Encounter (Signed)
Patient called stating she needs script for Lyrica,

## 2018-10-29 ENCOUNTER — Other Ambulatory Visit: Payer: Self-pay

## 2018-10-29 ENCOUNTER — Ambulatory Visit: Payer: Medicaid Other | Attending: Anesthesiology | Admitting: Anesthesiology

## 2018-10-29 ENCOUNTER — Encounter: Payer: Self-pay | Admitting: Anesthesiology

## 2018-10-29 DIAGNOSIS — M542 Cervicalgia: Secondary | ICD-10-CM | POA: Diagnosis not present

## 2018-10-29 DIAGNOSIS — M5030 Other cervical disc degeneration, unspecified cervical region: Secondary | ICD-10-CM

## 2018-10-29 DIAGNOSIS — M47816 Spondylosis without myelopathy or radiculopathy, lumbar region: Secondary | ICD-10-CM

## 2018-10-29 DIAGNOSIS — M5431 Sciatica, right side: Secondary | ICD-10-CM | POA: Diagnosis not present

## 2018-10-29 DIAGNOSIS — Z79891 Long term (current) use of opiate analgesic: Secondary | ICD-10-CM

## 2018-10-29 DIAGNOSIS — G894 Chronic pain syndrome: Secondary | ICD-10-CM

## 2018-10-29 DIAGNOSIS — M79641 Pain in right hand: Secondary | ICD-10-CM

## 2018-10-29 MED ORDER — OXYCODONE HCL 5 MG PO TABS: 5.0000 mg | ORAL_TABLET | Freq: Four times a day (QID) | ORAL | 0 refills | Status: DC | PRN

## 2018-10-29 MED ORDER — PREGABALIN 75 MG PO CAPS: 150.0000 mg | ORAL_CAPSULE | Freq: Two times a day (BID) | ORAL | 3 refills | Status: DC

## 2018-10-29 MED ORDER — OXYCODONE HCL 5 MG PO TABS: 5.0000 mg | ORAL_TABLET | Freq: Four times a day (QID) | ORAL | 0 refills | Status: AC | PRN

## 2018-10-29 NOTE — Progress Notes (Signed)
Virtual Visit via Telephone Note  I connected with Hannah Maynard on 10/29/18 at 12:15 PM EDT by telephone and verified that I am speaking with the correct person using two identifiers.  Location: Patient: Home Provider: Pain control center   I discussed the limitations, risks, security and privacy concerns of performing an evaluation and management service by telephone and the availability of in person appointments. I also discussed with the patient that there may be a patient responsible charge related to this service. The patient expressed understanding and agreed to proceed.   History of Present Illness: I spoke with [name] today via telephone conferencing.  She was unable to do the video voice conferencing.  She still having pain with the low back and right leg.  She did very well following her last epidural approximately a month ago and this knocked out most of her hip pain and right lower extremity pain.  She did go on a ride to the mountains with her son approximately 3 to 5 days ago and this caused some worsening of her low back pain with recurrence of the right lower extremity calf cramping as well.  She is taking her medications as prescribed with good success.  These do help her pain and keep her pain under good control no side effects are reported.  The quality of the pain is stable in nature.  No changes in bowel or bladder function or strength are noted.  She does have some occasional give way weakness which is been chronic for her.  No changes are reported.  She did see Dr. Lacinda Axon for neurosurgical consult and he advised weight loss and cigarette smoking cessation.  She has been doing some physical therapy exercises but continues to have the pain and is questioning whether she can come in for repeat epidural soon.    Observations/Objective: Current Outpatient Medications:  .  ibuprofen (ADVIL,MOTRIN) 200 MG tablet, Take 200 mg by mouth every 6 (six) hours as needed., Disp: , Rfl:   .  meloxicam (MOBIC) 7.5 MG tablet, Take 1 tablet (7.5 mg total) by mouth 2 (two) times daily., Disp: 60 tablet, Rfl: 1 .  methocarbamol (ROBAXIN) 750 MG tablet, Take 750 mg by mouth 3 (three) times daily., Disp: , Rfl:  .  montelukast (SINGULAIR) 10 MG tablet, Take 10 mg by mouth at bedtime., Disp: , Rfl:  .  Nerve Stimulator (ZEWA TENS/EMS COMBO UNIT) DEVI, by Does not apply route., Disp: , Rfl:  .  oxycodone (OXY-IR) 5 MG capsule, Take 1 capsule (5 mg total) by mouth every 4 (four) hours as needed., Disp: 120 capsule, Rfl: 0 .  [START ON 11/07/2018] oxyCODONE (ROXICODONE) 5 MG immediate release tablet, Take 1 tablet (5 mg total) by mouth every 6 (six) hours as needed for severe pain., Disp: 120 tablet, Rfl: 0 .  [START ON 12/07/2018] oxyCODONE (ROXICODONE) 5 MG immediate release tablet, Take 1 tablet (5 mg total) by mouth every 6 (six) hours as needed for severe pain., Disp: 120 tablet, Rfl: 0 .  pregabalin (LYRICA) 75 MG capsule, Take 2 capsules (150 mg total) by mouth 2 (two) times daily., Disp: 60 capsule, Rfl: 3 .  PROAIR HFA 108 (90 Base) MCG/ACT inhaler, Inhale 2 puffs into the lungs as needed., Disp: , Rfl:  .  sertraline (ZOLOFT) 50 MG tablet, Take 50 mg by mouth daily., Disp: , Rfl:   Current Facility-Administered Medications:  .  lidocaine (PF) (XYLOCAINE) 1 % injection 5 mL, 5 mL, Subcutaneous, Once,  Molli Barrows, MD .  ropivacaine (PF) 2 mg/mL (0.2%) (NAROPIN) injection 10 mL, 10 mL, Epidural, Once, Andree Elk Alvina Filbert, MD   Assessment and Plan: 1. Lumbar spondylosis   2. DDD (degenerative disc disease), cervical   3. Right sided sciatica   4. Facet arthritis of lumbar region   5. Cervicalgia   6. Chronic pain syndrome   7. Long term current use of opiate analgesic   8. Right hand pain   Based on our discussion today I am going to refill her medications.  I have reviewed the [facility] practitioner database information and it is appropriate.  I am going to schedule her  forHome and it is appropriate.She last [phone] refills for September 12 and October 12.  I will also schedule her for return to clinic in 1 month for an epidural steroid injection.  We have gone over this with her in detail with all risks and benefits reviewed.  I want her to continue efforts at weight loss and smoking cessation and follow-up with her primary care physician to assist with this.   Follow Up Instructions:    I discussed the assessment and treatment plan with the patient. The patient was provided an opportunity to ask questions and all were answered. The patient agreed with the plan and demonstrated an understanding of the instructions.   The patient was advised to call back or seek an in-person evaluation if the symptoms worsen or if the condition fails to improve as anticipated.  I provided 30 minutes of non-face-to-face time during this encounter.   Molli Barrows, MD

## 2018-11-23 ENCOUNTER — Other Ambulatory Visit: Payer: Self-pay | Admitting: *Deleted

## 2018-11-23 ENCOUNTER — Telehealth: Payer: Self-pay | Admitting: Anesthesiology

## 2018-11-23 MED ORDER — METHOCARBAMOL 750 MG PO TABS: 750.0000 mg | ORAL_TABLET | Freq: Three times a day (TID) | ORAL | 0 refills | Status: AC

## 2018-11-23 NOTE — Telephone Encounter (Addendum)
States her methocarbamol did not get refilled on Sept 3 visit. Please check this and other meds as her next appt could not be scheduled until 10-15

## 2018-11-23 NOTE — Telephone Encounter (Signed)
Please send this in 

## 2018-12-01 NOTE — Telephone Encounter (Signed)
Ok to refill methocarbamol.Marland KitchenJA

## 2018-12-10 ENCOUNTER — Encounter: Payer: Self-pay | Admitting: Anesthesiology

## 2018-12-10 ENCOUNTER — Other Ambulatory Visit: Payer: Self-pay

## 2018-12-10 ENCOUNTER — Ambulatory Visit
Admission: RE | Admit: 2018-12-10 | Discharge: 2018-12-10 | Disposition: A | Discharge status: Home / Self Care | Payer: Medicaid Other | Source: Ambulatory Visit | Attending: Anesthesiology | Admitting: Anesthesiology

## 2018-12-10 ENCOUNTER — Ambulatory Visit: Payer: Medicaid Other | Admitting: Anesthesiology

## 2018-12-10 ENCOUNTER — Other Ambulatory Visit: Payer: Self-pay | Admitting: Anesthesiology

## 2018-12-10 VITALS — BP 132/81 | HR 75 | Temp 98.2°F | Resp 19 | Ht 65.0 in | Wt 218.0 lb

## 2018-12-10 DIAGNOSIS — G894 Chronic pain syndrome: Secondary | ICD-10-CM | POA: Insufficient documentation

## 2018-12-10 DIAGNOSIS — M47816 Spondylosis without myelopathy or radiculopathy, lumbar region: Secondary | ICD-10-CM

## 2018-12-10 DIAGNOSIS — M5030 Other cervical disc degeneration, unspecified cervical region: Secondary | ICD-10-CM | POA: Diagnosis present

## 2018-12-10 DIAGNOSIS — Z79891 Long term (current) use of opiate analgesic: Secondary | ICD-10-CM

## 2018-12-10 DIAGNOSIS — R52 Pain, unspecified: Secondary | ICD-10-CM

## 2018-12-10 MED ORDER — SODIUM CHLORIDE (PF) 0.9 % IJ SOLN
INTRAMUSCULAR | Status: AC
  Filled 2018-12-10: qty 10

## 2018-12-10 MED ORDER — LIDOCAINE HCL (PF) 1 % IJ SOLN
5.0000 mL | Freq: Once | INTRAMUSCULAR | Status: AC
  Administered 2018-12-10: 5 mL via SUBCUTANEOUS

## 2018-12-10 MED ORDER — LIDOCAINE HCL 2 % IJ SOLN
INTRAMUSCULAR | Status: AC
  Filled 2018-12-10: qty 20

## 2018-12-10 MED ORDER — MIDAZOLAM HCL 5 MG/5ML IJ SOLN
5.0000 mg | Freq: Once | INTRAMUSCULAR | Status: AC
  Administered 2018-12-10: 2 mg via INTRAVENOUS

## 2018-12-10 MED ORDER — MIDAZOLAM HCL 2 MG/2ML IJ SOLN
5.0000 mg | Freq: Once | INTRAMUSCULAR | Status: DC
  Filled 2018-12-10: qty 5

## 2018-12-10 MED ORDER — LACTATED RINGERS IV SOLN
1000.0000 mL | INTRAVENOUS | Status: DC
  Administered 2018-12-10: 1000 mL via INTRAVENOUS

## 2018-12-10 MED ORDER — MIDAZOLAM HCL 5 MG/5ML IJ SOLN
INTRAMUSCULAR | Status: AC
  Filled 2018-12-10: qty 5

## 2018-12-10 MED ORDER — OXYCODONE HCL 5 MG PO TABS: 5.0000 mg | ORAL_TABLET | Freq: Four times a day (QID) | ORAL | 0 refills | Status: DC | PRN

## 2018-12-10 MED ORDER — SODIUM CHLORIDE 0.9% FLUSH
10.0000 mL | Freq: Once | INTRAVENOUS | Status: AC
  Administered 2018-12-10: 10 mL

## 2018-12-10 MED ORDER — IOPAMIDOL (ISOVUE-M 200) INJECTION 41%: 20.0000 mL | Freq: Once | INTRAMUSCULAR | Status: DC | PRN

## 2018-12-10 MED ORDER — ROPIVACAINE HCL 2 MG/ML IJ SOLN
INTRAMUSCULAR | Status: AC
  Filled 2018-12-10: qty 10

## 2018-12-10 MED ORDER — ROPIVACAINE HCL 2 MG/ML IJ SOLN
10.0000 mL | Freq: Once | INTRAMUSCULAR | Status: AC
  Administered 2018-12-10: 10 mL via EPIDURAL

## 2018-12-10 MED ORDER — TRIAMCINOLONE ACETONIDE 40 MG/ML IJ SUSP
40.0000 mg | Freq: Once | INTRAMUSCULAR | Status: AC
  Administered 2018-12-10: 40 mg

## 2018-12-10 MED ORDER — TRIAMCINOLONE ACETONIDE 40 MG/ML IJ SUSP
INTRAMUSCULAR | Status: AC
  Filled 2018-12-10: qty 1

## 2018-12-10 MED ORDER — IOHEXOL 180 MG/ML  SOLN
INTRAMUSCULAR | Status: AC
  Filled 2018-12-10: qty 20

## 2018-12-10 NOTE — Progress Notes (Signed)
Safety precautions to be maintained throughout the outpatient stay will include: orient to surroundings, keep bed in low position, maintain call bell within reach at all times, provide assistance with transfer out of bed and ambulation.  

## 2018-12-10 NOTE — Progress Notes (Signed)
Subjective:  Patient ID: Hannah Maynard, female    DOB: 1965/08/17  Age: 53 y.o. MRN: [id number]  CC: Back Pain (low)   Procedure: L5-S1 epidural steroid under fluoroscopic guidance with moderate sedation  HPI Hannah Maynard presents for reevaluation.  She was last seen few months ago and had an epidural at that time.  Back in August she had a lumbar epidural giving her approximately 60 to 70% improvement in her low back pain and bilateral lower extremity pain.  She has had a gradual recurrence of the same quality characteristic distribution of pain as previously documented.  No new changes in lower extremity strength or function or bowel or bladder function are noted at this time.  She also continues take her medications as prescribed and these continue to work well for her.  She maintains that she is compliant with her medicines and that she is deriving good functional lifestyle improvement with them.  No other changes are noted in her lower extremity strength.  No side effects to the medications are reported today  Outpatient Medications Prior to Visit  Medication Sig Dispense Refill  . ibuprofen (ADVIL,MOTRIN) 200 MG tablet Take 200 mg by mouth every 6 (six) hours as needed.    . meloxicam (MOBIC) 7.5 MG tablet Take 1 tablet (7.5 mg total) by mouth 2 (two) times daily. 60 tablet 1  . methocarbamol (ROBAXIN) 750 MG tablet Take 1 tablet (750 mg total) by mouth 3 (three) times daily. 90 tablet 0  . montelukast (SINGULAIR) 10 MG tablet Take 10 mg by mouth at bedtime.    . Nerve Stimulator (ZEWA TENS/EMS COMBO UNIT) DEVI by Does not apply route.    . pregabalin (LYRICA) 75 MG capsule Take 2 capsules (150 mg total) by mouth 2 (two) times daily. 60 capsule 3  . PROAIR HFA 108 (90 Base) MCG/ACT inhaler Inhale 2 puffs into the lungs as needed.    . sertraline (ZOLOFT) 50 MG tablet Take 50 mg by mouth daily.    Marland Kitchen oxyCODONE (ROXICODONE) 5 MG immediate release tablet Take 1 tablet (5 mg total) by  mouth every 6 (six) hours as needed for severe pain. 120 tablet 0   Facility-Administered Medications Prior to Visit  Medication Dose Route Frequency Provider Last Rate Last Dose  . lidocaine (PF) (XYLOCAINE) 1 % injection 5 mL  5 mL Subcutaneous Once Molli Barrows, MD      . ropivacaine (PF) 2 mg/mL (0.2%) (NAROPIN) injection 10 mL  10 mL Epidural Once Molli Barrows, MD        Review of Systems CNS: No confusion or sedation Cardiac: No angina or palpitations GI: No abdominal pain or constipation Constitutional: No nausea vomiting fevers or chills  Objective:  BP 134/83   Pulse 75   Temp 98.4 F (36.9 C)   Resp 20   Ht 5\' 5"  (1.651 m)   Wt 218 lb (98.9 kg)   LMP  (LMP Unknown)   SpO2 98%   BMI 36.28 kg/m    BP Readings from Last 3 Encounters:  12/10/18 134/83  10/05/18 (!) 155/84  04/30/18 (!) 190/97     Wt Readings from Last 3 Encounters:  12/10/18 218 lb (98.9 kg)  10/05/18 224 lb 9.6 oz (101.9 kg)  04/30/18 238 lb (108 kg)     Physical Exam Pt is alert and oriented PERRL EOMI HEART IS RRR no murmur or rub LCTA no wheezing or rales MUSCULOSKELETAL.  Reveals some paraspinous muscle tenderness  but no overt trigger points.  She ambulates with a slightly antalgic gait and her muscle tone and bulk is at baseline.  Labs  No results found for: HGBA1C Lab Results  Component Value Date   CREATININE 0.85 10/28/2017    -------------------------------------------------------------------------------------------------------------------- Lab Results  Component Value Date   GLUCOSE 108 (H) 10/28/2017   AST 49 (H) 10/28/2017   NA 142 10/28/2017   K 4.4 10/28/2017   CL 107 (H) 10/28/2017   CREATININE 0.85 10/28/2017   BUN 9 10/28/2017    --------------------------------------------------------------------------------------------------------------------- [facility] C-arm 1-60 Min No Report  Result Date: 12/10/2018 Fluoro was used, but no Radiologist  interpretation will be provided. Please refer to "NOTES" tab for provider progress note.    Assessment & Plan:   Hannah Maynard was seen today for back pain.  Diagnoses and all orders for this visit:  Lumbar spondylosis  DDD (degenerative disc disease), cervical  Long term current use of opiate analgesic  Chronic pain syndrome  Other orders -     oxyCODONE (ROXICODONE) 5 MG immediate release tablet; Take 1 tablet (5 mg total) by mouth every 6 (six) hours as needed for severe pain. -     oxyCODONE (ROXICODONE) 5 MG immediate release tablet; Take 1 tablet (5 mg total) by mouth every 6 (six) hours as needed for severe pain. -     triamcinolone acetonide (KENALOG-40) injection 40 mg -     sodium chloride flush (NS) 0.9 % injection 10 mL -     ropivacaine (PF) 2 mg/mL (0.2%) (NAROPIN) injection 10 mL -     midazolam (VERSED) injection 5 mg -     lidocaine (PF) (XYLOCAINE) 1 % injection 5 mL -     lactated ringers infusion 1,000 mL -     iopamidol (ISOVUE-M) 41 % intrathecal injection 20 mL -     midazolam (VERSED) injection 5 mg        ----------------------------------------------------------------------------------------------------------------------  Problem List Items Addressed This Visit      Unprioritized   Chronic pain syndrome   Relevant Medications   oxyCODONE (ROXICODONE) 5 MG immediate release tablet (Start on 01/06/2019)   oxyCODONE (ROXICODONE) 5 MG immediate release tablet (Start on 02/05/2019)   DDD (degenerative disc disease), cervical   Relevant Medications   oxyCODONE (ROXICODONE) 5 MG immediate release tablet (Start on 01/06/2019)   oxyCODONE (ROXICODONE) 5 MG immediate release tablet (Start on 02/05/2019)   Long term current use of opiate analgesic   Lumbar spondylosis - Primary   Relevant Medications   oxyCODONE (ROXICODONE) 5 MG immediate release tablet (Start on 01/06/2019)   oxyCODONE (ROXICODONE) 5 MG immediate release tablet (Start on 02/05/2019)         ----------------------------------------------------------------------------------------------------------------------  1. Lumbar spondylosis Continue core stretching strengthening exercises and efforts at weight loss.  2. DDD (degenerative disc disease), cervical Continue as above  3. Long term current use of opiate analgesic I have reviewed the [facility] practitioner database information and it is appropriate.  We will refill her medications for November 11 and December.  I schedule her for return to clinic in 2 months.  We may pursue a repeat epidural in January of next year.  4. Chronic pain syndrome As above 5.  Bilateral lower extremity sciatica: Chronic.. We will proceed with a repeat epidural today.  The risks and benefits of been reviewed with her in full detail and all questions answered.  I want her to continue efforts at stretching strengthening.    ----------------------------------------------------------------------------------------------------------------------  I am having Hannah Maynard start on oxyCODONE. I am also having her maintain her ibuprofen, [place] TENS/EMS Combo Unit, meloxicam, ProAir HFA, montelukast, sertraline, pregabalin, methocarbamol, and oxyCODONE. We administered triamcinolone acetonide, sodium chloride flush, ropivacaine (PF) 2 mg/mL (0.2%), lidocaine (PF), and lactated ringers. We will continue to administer ropivacaine (PF) 2 mg/mL (0.2%) and lidocaine (PF).   Meds ordered this encounter  Medications  . oxyCODONE (ROXICODONE) 5 MG immediate release tablet    Sig: Take 1 tablet (5 mg total) by mouth every 6 (six) hours as needed for severe pain.    Dispense:  120 tablet    Refill:  0    30 day supply.... do not refill early  . oxyCODONE (ROXICODONE) 5 MG immediate release tablet    Sig: Take 1 tablet (5 mg total) by mouth every 6 (six) hours as needed for severe pain.    Dispense:  120 tablet    Refill:  0    30 day supply....  do not refill early  . triamcinolone acetonide (KENALOG-40) injection 40 mg  . sodium chloride flush (NS) 0.9 % injection 10 mL  . ropivacaine (PF) 2 mg/mL (0.2%) (NAROPIN) injection 10 mL  . midazolam (VERSED) injection 5 mg  . lidocaine (PF) (XYLOCAINE) 1 % injection 5 mL  . lactated ringers infusion 1,000 mL  . iopamidol (ISOVUE-M) 41 % intrathecal injection 20 mL  . midazolam (VERSED) injection 5 mg   Patient's Medications  New Prescriptions   OXYCODONE (ROXICODONE) 5 MG IMMEDIATE RELEASE TABLET    Take 1 tablet (5 mg total) by mouth every 6 (six) hours as needed for severe pain.  Previous Medications   IBUPROFEN (ADVIL,MOTRIN) 200 MG TABLET    Take 200 mg by mouth every 6 (six) hours as needed.   MELOXICAM (MOBIC) 7.5 MG TABLET    Take 1 tablet (7.5 mg total) by mouth 2 (two) times daily.   METHOCARBAMOL (ROBAXIN) 750 MG TABLET    Take 1 tablet (750 mg total) by mouth 3 (three) times daily.   MONTELUKAST (SINGULAIR) 10 MG TABLET    Take 10 mg by mouth at bedtime.   NERVE STIMULATOR (ZEWA TENS/EMS COMBO UNIT) DEVI    by Does not apply route.   PREGABALIN (LYRICA) 75 MG CAPSULE    Take 2 capsules (150 mg total) by mouth 2 (two) times daily.   PROAIR HFA 108 (90 BASE) MCG/ACT INHALER    Inhale 2 puffs into the lungs as needed.   SERTRALINE (ZOLOFT) 50 MG TABLET    Take 50 mg by mouth daily.  Modified Medications   Modified Medication Previous Medication   OXYCODONE (ROXICODONE) 5 MG IMMEDIATE RELEASE TABLET oxyCODONE (ROXICODONE) 5 MG immediate release tablet      Take 1 tablet (5 mg total) by mouth every 6 (six) hours as needed for severe pain.    Take 1 tablet (5 mg total) by mouth every 6 (six) hours as needed for severe pain.  Discontinued Medications   No medications on file   ----------------------------------------------------------------------------------------------------------------------  Follow-up: Return in about 2 months (around 02/09/2019) for evaluation, med refill.    Procedure: L5-S1 LESI with fluoroscopic guidance and with moderate sedation  NOTE: The risks, benefits, and expectations of the procedure have been discussed and explained to the patient who was understanding and in agreement with suggested treatment plan. No guarantees were made.  DESCRIPTION OF PROCEDURE: Lumbar epidural steroid injection with 3 mg IV Versed, EKG, blood pressure, pulse, and pulse oximetry monitoring.  The procedure was performed with the patient in the prone position under fluoroscopic guidance.  Sterile prep x3 was initiated and I then injected subcutaneous lidocaine to the overlying L5-S1 site after its fluoroscopic identifictation.  Using strict aseptic technique, I then advanced an 18-gauge Tuohy epidural needle in the midline using interlaminar approach via loss-of-resistance to saline technique. There was negative aspiration for heme or  CSF.  I then confirmed position with both AP and Lateral fluoroscan.  2 cc of contrast dye were injected and a  total of 5 mL of Preservative-Free normal saline mixed with 40 mg of Kenalog and 1cc Ropicaine 0.2 percent were injected incrementally via the  epidurally placed needle. The needle was removed. The patient tolerated the injection well and was convalesced and discharged to home in stable condition. Should the patient have any post procedure difficulty they have been instructed on how to contact us for assistance.    Molli Barrows, MD

## 2019-02-01 ENCOUNTER — Telehealth: Payer: Self-pay | Admitting: *Deleted

## 2019-02-01 NOTE — Telephone Encounter (Signed)
There is an order for LESI. Please call patient to schedule.

## 2019-02-02 NOTE — Telephone Encounter (Signed)
Patient is scheduled for 12-16- procedure

## 2019-02-04 ENCOUNTER — Telehealth: Payer: Self-pay | Admitting: Anesthesiology

## 2019-02-04 ENCOUNTER — Encounter: Payer: Self-pay | Admitting: Anesthesiology

## 2019-02-04 ENCOUNTER — Other Ambulatory Visit: Payer: Self-pay

## 2019-02-04 ENCOUNTER — Ambulatory Visit: Payer: Medicaid Other | Attending: Anesthesiology | Admitting: Anesthesiology

## 2019-02-04 DIAGNOSIS — M47816 Spondylosis without myelopathy or radiculopathy, lumbar region: Secondary | ICD-10-CM | POA: Diagnosis not present

## 2019-02-04 DIAGNOSIS — G894 Chronic pain syndrome: Secondary | ICD-10-CM | POA: Diagnosis not present

## 2019-02-04 DIAGNOSIS — M542 Cervicalgia: Secondary | ICD-10-CM

## 2019-02-04 DIAGNOSIS — M5442 Lumbago with sciatica, left side: Secondary | ICD-10-CM

## 2019-02-04 DIAGNOSIS — M5030 Other cervical disc degeneration, unspecified cervical region: Secondary | ICD-10-CM | POA: Diagnosis not present

## 2019-02-04 DIAGNOSIS — M5441 Lumbago with sciatica, right side: Secondary | ICD-10-CM

## 2019-02-04 DIAGNOSIS — Z79891 Long term (current) use of opiate analgesic: Secondary | ICD-10-CM

## 2019-02-04 DIAGNOSIS — G8929 Other chronic pain: Secondary | ICD-10-CM

## 2019-02-04 MED ORDER — MELOXICAM 7.5 MG PO TABS: 7.5000 mg | ORAL_TABLET | Freq: Two times a day (BID) | ORAL | 3 refills | Status: AC

## 2019-02-04 MED ORDER — PREGABALIN 75 MG PO CAPS: 150.0000 mg | ORAL_CAPSULE | Freq: Two times a day (BID) | ORAL | 3 refills | Status: DC

## 2019-02-04 MED ORDER — OXYCODONE HCL 5 MG PO TABS: 5.0000 mg | ORAL_TABLET | Freq: Four times a day (QID) | ORAL | 0 refills | Status: AC | PRN

## 2019-02-04 NOTE — Telephone Encounter (Signed)
Patient just had mouth surgery and [facility] will not fill meds until we call them and tell them it is ok to fill. Please call [facility] and help patient out

## 2019-02-04 NOTE — Progress Notes (Signed)
Virtual Visit via Telephone Note  I connected with [place] on 02/04/19 at 12:00 PM EST by telephone and verified that I am speaking with the correct person using two identifiers.  Location: Patient: Home Provider: Pain control center   I discussed the limitations, risks, security and privacy concerns of performing an evaluation and management service by telephone and the availability of in person appointments. I also discussed with the patient that there may be a patient responsible charge related to this service. The patient expressed understanding and agreed to proceed.   History of Present Illness: I spoke with [name] via telephone for her virtual conference as she was not able to do the video.  She states that she has been doing relatively well with her low back pain.  She had in October epidural steroid injection at L5-S1 and this gave her some good relief of the low back pain but was less effective than some previous injections for her leg pain.  The quality characteristic and distribution of this have otherwise been stable.  She had previous injections in March and August.  Otherwise she reports no change in lower extremity strength or function or bowel or bladder function.  She is also taking her medications as prescribed and these continue to work well for her.  Based on our discussion today she continues to derive good functional lifestyle improvement with the medications and no side effects are reported.  She is trying to do her stretching strengthening exercises to a limited extent and relies on her work related activities as her primary physical therapy.    Observations/Objective:  Current Outpatient Medications:  .  ibuprofen (ADVIL,MOTRIN) 200 MG tablet, Take 200 mg by mouth every 6 (six) hours as needed., Disp: , Rfl:  .  meloxicam (MOBIC) 7.5 MG tablet, Take 1 tablet (7.5 mg total) by mouth 2 (two) times daily., Disp: 60 tablet, Rfl: 3 .  montelukast (SINGULAIR) 10 MG  tablet, Take 10 mg by mouth at bedtime., Disp: , Rfl:  .  Nerve Stimulator (ZEWA TENS/EMS COMBO UNIT) DEVI, by Does not apply route., Disp: , Rfl:  .  [START ON 02/05/2019] oxyCODONE (ROXICODONE) 5 MG immediate release tablet, Take 1 tablet (5 mg total) by mouth every 6 (six) hours as needed for severe pain., Disp: 120 tablet, Rfl: 0 .  [START ON 03/06/2019] oxyCODONE (ROXICODONE) 5 MG immediate release tablet, Take 1 tablet (5 mg total) by mouth every 6 (six) hours as needed for severe pain., Disp: 120 tablet, Rfl: 0 .  pregabalin (LYRICA) 75 MG capsule, Take 2 capsules (150 mg total) by mouth 2 (two) times daily., Disp: 60 capsule, Rfl: 3 .  PROAIR HFA 108 (90 Base) MCG/ACT inhaler, Inhale 2 puffs into the lungs as needed., Disp: , Rfl:  .  sertraline (ZOLOFT) 50 MG tablet, Take 50 mg by mouth daily., Disp: , Rfl:   Current Facility-Administered Medications:  .  lidocaine (PF) (XYLOCAINE) 1 % injection 5 mL, 5 mL, Subcutaneous, Once, Molli Barrows, MD .  ropivacaine (PF) 2 mg/mL (0.2%) (NAROPIN) injection 10 mL, 10 mL, Epidural, Once, Andree Elk Alvina Filbert, MD  Assessment and Plan: 1. Lumbar spondylosis   2. DDD (degenerative disc disease), cervical   3. Chronic pain syndrome   4. Chronic bilateral low back pain with bilateral sciatica   5. Long term current use of opiate analgesic   6. Cervicalgia   Based on our discussion today I will go ahead and refill her medications for December  with return to clinic in 1 month and a scheduled repeat epidural.  I will plan to do this at the L4-5 interspace.  I am hopeful that this change may give her better longer-term relief based on her anatomic pathology.  I am also getting keep her on her current medication regimen.  We may ultimately add in a muscle relaxant as a potential option for some of the spasming in her low back.  I also talked to her about a more disciplined and dedicated physical therapy regimen other than just work related activities as I think  this would help with some of the back spasming.  She is to continue follow-up with her primary care physicians for her baseline medical care. Follow Up Instructions:    I discussed the assessment and treatment plan with the patient. The patient was provided an opportunity to ask questions and all were answered. The patient agreed with the plan and demonstrated an understanding of the instructions.   The patient was advised to call back or seek an in-person evaluation if the symptoms worsen or if the condition fails to improve as anticipated.  I provided 30 minutes of non-face-to-face time during this encounter.   Molli Barrows, MD

## 2019-02-04 NOTE — Telephone Encounter (Signed)
Called and the prescriber was aware that she takes oxycodone from [place]. OK to fill. Pharmacy aware.

## 2019-02-10 ENCOUNTER — Other Ambulatory Visit: Payer: Self-pay

## 2019-02-10 ENCOUNTER — Other Ambulatory Visit: Payer: Self-pay | Admitting: Anesthesiology

## 2019-02-10 ENCOUNTER — Encounter: Payer: Self-pay | Admitting: Anesthesiology

## 2019-02-10 ENCOUNTER — Ambulatory Visit (HOSPITAL_BASED_OUTPATIENT_CLINIC_OR_DEPARTMENT_OTHER): Payer: Medicaid Other | Admitting: Anesthesiology

## 2019-02-10 ENCOUNTER — Ambulatory Visit
Admission: RE | Admit: 2019-02-10 | Discharge: 2019-02-10 | Disposition: A | Discharge status: Home / Self Care | Payer: Medicaid Other | Source: Ambulatory Visit | Attending: Anesthesiology | Admitting: Anesthesiology

## 2019-02-10 VITALS — BP 154/86 | HR 69 | Temp 96.6°F | Resp 16 | Ht 65.0 in | Wt 220.0 lb

## 2019-02-10 DIAGNOSIS — Z79891 Long term (current) use of opiate analgesic: Secondary | ICD-10-CM | POA: Insufficient documentation

## 2019-02-10 DIAGNOSIS — M542 Cervicalgia: Secondary | ICD-10-CM

## 2019-02-10 DIAGNOSIS — R52 Pain, unspecified: Secondary | ICD-10-CM | POA: Insufficient documentation

## 2019-02-10 DIAGNOSIS — G8929 Other chronic pain: Secondary | ICD-10-CM

## 2019-02-10 DIAGNOSIS — M5441 Lumbago with sciatica, right side: Secondary | ICD-10-CM | POA: Insufficient documentation

## 2019-02-10 DIAGNOSIS — R29898 Other symptoms and signs involving the musculoskeletal system: Secondary | ICD-10-CM

## 2019-02-10 DIAGNOSIS — M79641 Pain in right hand: Secondary | ICD-10-CM

## 2019-02-10 DIAGNOSIS — M5431 Sciatica, right side: Secondary | ICD-10-CM | POA: Diagnosis not present

## 2019-02-10 DIAGNOSIS — M5030 Other cervical disc degeneration, unspecified cervical region: Secondary | ICD-10-CM

## 2019-02-10 DIAGNOSIS — M47816 Spondylosis without myelopathy or radiculopathy, lumbar region: Secondary | ICD-10-CM | POA: Insufficient documentation

## 2019-02-10 DIAGNOSIS — M5442 Lumbago with sciatica, left side: Secondary | ICD-10-CM | POA: Insufficient documentation

## 2019-02-10 MED ORDER — TRIAMCINOLONE ACETONIDE 40 MG/ML IJ SUSP
INTRAMUSCULAR | Status: AC
  Filled 2019-02-10: qty 1

## 2019-02-10 MED ORDER — LACTATED RINGERS IV SOLN
1000.0000 mL | INTRAVENOUS | Status: DC
  Administered 2019-02-10: 1000 mL via INTRAVENOUS

## 2019-02-10 MED ORDER — OXYCODONE HCL 5 MG PO TABS: 5.0000 mg | ORAL_TABLET | Freq: Four times a day (QID) | ORAL | 0 refills | Status: AC | PRN

## 2019-02-10 MED ORDER — ROPIVACAINE HCL 2 MG/ML IJ SOLN
INTRAMUSCULAR | Status: AC
  Filled 2019-02-10: qty 10

## 2019-02-10 MED ORDER — SODIUM CHLORIDE 0.9% FLUSH
10.0000 mL | Freq: Once | INTRAVENOUS | Status: AC
  Administered 2019-02-10: 10 mL

## 2019-02-10 MED ORDER — LIDOCAINE HCL (PF) 1 % IJ SOLN
INTRAMUSCULAR | Status: AC
  Filled 2019-02-10: qty 5

## 2019-02-10 MED ORDER — FENTANYL CITRATE (PF) 100 MCG/2ML IJ SOLN: 100.0000 ug | Freq: Once | INTRAMUSCULAR | Status: DC

## 2019-02-10 MED ORDER — TRIAMCINOLONE ACETONIDE 40 MG/ML IJ SUSP
40.0000 mg | Freq: Once | INTRAMUSCULAR | Status: AC
  Administered 2019-02-10: 12:00:00 40 mg

## 2019-02-10 MED ORDER — MIDAZOLAM HCL 5 MG/5ML IJ SOLN
INTRAMUSCULAR | Status: AC
  Filled 2019-02-10: qty 5

## 2019-02-10 MED ORDER — IOPAMIDOL (ISOVUE-M 200) INJECTION 41%: 20.0000 mL | Freq: Once | INTRAMUSCULAR | Status: DC | PRN

## 2019-02-10 MED ORDER — IOHEXOL 180 MG/ML  SOLN
INTRAMUSCULAR | Status: AC
  Filled 2019-02-10: qty 20

## 2019-02-10 MED ORDER — MIDAZOLAM HCL 5 MG/5ML IJ SOLN
5.0000 mg | Freq: Once | INTRAMUSCULAR | Status: AC
  Administered 2019-02-10: 53 mg via INTRAVENOUS

## 2019-02-10 MED ORDER — LIDOCAINE HCL (PF) 1 % IJ SOLN
5.0000 mL | Freq: Once | INTRAMUSCULAR | Status: AC
  Administered 2019-02-10: 5 mL via SUBCUTANEOUS

## 2019-02-10 MED ORDER — SODIUM CHLORIDE (PF) 0.9 % IJ SOLN
INTRAMUSCULAR | Status: AC
  Filled 2019-02-10: qty 10

## 2019-02-10 MED ORDER — ROPIVACAINE HCL 2 MG/ML IJ SOLN
10.0000 mL | Freq: Once | INTRAMUSCULAR | Status: AC
  Administered 2019-02-10: 10 mL via EPIDURAL

## 2019-02-10 NOTE — Patient Instructions (Signed)

## 2019-02-10 NOTE — Progress Notes (Signed)
Safety precautions to be maintained throughout the outpatient stay will include: orient to surroundings, keep bed in low position, maintain call bell within reach at all times, provide assistance with transfer out of bed and ambulation.  

## 2019-02-11 ENCOUNTER — Encounter: Payer: Self-pay | Admitting: Anesthesiology

## 2019-02-11 NOTE — Progress Notes (Signed)
Subjective:  Patient ID: Hannah Maynard, female    DOB: July 27, 1965  Age: 53 y.o. MRN: [id number]  CC: Back Pain (lower)   Procedure: L5-S1 epidural steroid under fluoroscopic guidance moderate sedation  HPI [name] presents for reevaluation.  She was last seen via virtual visit and preceding that she had an October epidural.  She had good relief with the injection for a back pain that has failed conservative therapy.  She reports a 70% reduction in her low back pain and approximately 75 to 80% reduction in her lower extremity pain.  Some of this has recurred.  Primarily the low back pain.  She still reports a 50% reduction in her leg pain but has had some occasional give way weakness and she reports her right greater than left lower extremity calf cramping.  Her bowel and bladder function have been stable.  She is try to do some exercises with limited relief.  Unfortunately the back pain has been quite recalcitrant and has failed conservative therapy.  She is taking oxycodone as prescribed and these continue to help with her pain relief.  She denies any side effects.  Outpatient Medications Prior to Visit  Medication Sig Dispense Refill  . ibuprofen (ADVIL,MOTRIN) 200 MG tablet Take 200 mg by mouth every 6 (six) hours as needed.    . meloxicam (MOBIC) 7.5 MG tablet Take 1 tablet (7.5 mg total) by mouth 2 (two) times daily. 60 tablet 3  . montelukast (SINGULAIR) 10 MG tablet Take 10 mg by mouth at bedtime.    . Nerve Stimulator (ZEWA TENS/EMS COMBO UNIT) DEVI by Does not apply route.    Derrill Memo ON 03/06/2019] oxyCODONE (ROXICODONE) 5 MG immediate release tablet Take 1 tablet (5 mg total) by mouth every 6 (six) hours as needed for severe pain. 120 tablet 0  . pregabalin (LYRICA) 75 MG capsule Take 2 capsules (150 mg total) by mouth 2 (two) times daily. 60 capsule 3  . PROAIR HFA 108 (90 Base) MCG/ACT inhaler Inhale 2 puffs into the lungs as needed.    . sertraline (ZOLOFT) 50 MG tablet  Take 50 mg by mouth daily.    Marland Kitchen oxyCODONE (ROXICODONE) 5 MG immediate release tablet Take 1 tablet (5 mg total) by mouth every 6 (six) hours as needed for severe pain. 120 tablet 0   Facility-Administered Medications Prior to Visit  Medication Dose Route Frequency Provider Last Rate Last Admin  . lidocaine (PF) (XYLOCAINE) 1 % injection 5 mL  5 mL Subcutaneous Once Molli Barrows, MD      . ropivacaine (PF) 2 mg/mL (0.2%) (NAROPIN) injection 10 mL  10 mL Epidural Once Molli Barrows, MD        Review of Systems CNS: No confusion or sedation Cardiac: No angina or palpitations GI: No abdominal pain or constipation Constitutional: No nausea vomiting fevers or chills  Objective:  BP (!) 154/86   Pulse 69   Temp (!) 96.6 F (35.9 C)   Resp 16   Ht 5\' 5"  (1.651 m)   Wt 220 lb (99.8 kg)   LMP  (LMP Unknown)   SpO2 100%   BMI 36.61 kg/m    BP Readings from Last 3 Encounters:  02/10/19 (!) 154/86  12/10/18 132/81  10/05/18 (!) 155/84     Wt Readings from Last 3 Encounters:  02/10/19 220 lb (99.8 kg)  12/10/18 218 lb (98.9 kg)  10/05/18 224 lb 9.6 oz (101.9 kg)     Physical  Exam Pt is alert and oriented PERRL EOMI HEART IS RRR no murmur or rub LCTA no wheezing or rales MUSCULOSKELETAL reveals some paraspinous muscle tenderness and a positive straight leg raise on the right side.  Her muscle tone and bulk is at baseline.  She is ambulating with an antalgic gait.  Labs  No results found for: HGBA1C Lab Results  Component Value Date   CREATININE 0.85 10/28/2017    -------------------------------------------------------------------------------------------------------------------- Lab Results  Component Value Date   GLUCOSE 108 (H) 10/28/2017   AST 49 (H) 10/28/2017   NA 142 10/28/2017   K 4.4 10/28/2017   CL 107 (H) 10/28/2017   CREATININE 0.85 10/28/2017   BUN 9 10/28/2017     --------------------------------------------------------------------------------------------------------------------- DG PAIN CLINIC C-ARM 1-60 MIN NO REPORT  Result Date: 02/10/2019 Fluoro was used, but no Radiologist interpretation will be provided. Please refer to "NOTES" tab for provider progress note.    Assessment & Plan:   [name] was seen today for back pain.  Diagnoses and all orders for this visit:  Right sided sciatica  Chronic bilateral low back pain with bilateral sciatica  Lumbar spondylosis  DDD (degenerative disc disease), cervical  Long term current use of opiate analgesic  Cervicalgia  Weakness of both lower extremities  Right hand pain  Other orders -     triamcinolone acetonide (KENALOG-40) injection 40 mg -     sodium chloride flush (NS) 0.9 % injection 10 mL -     ropivacaine (PF) 2 mg/mL (0.2%) (NAROPIN) injection 10 mL -     midazolam (VERSED) 5 MG/5ML injection 5 mg -     lidocaine (PF) (XYLOCAINE) 1 % injection 5 mL -     lactated ringers infusion 1,000 mL -     iopamidol (ISOVUE-M) 41 % intrathecal injection 20 mL -     fentaNYL (SUBLIMAZE) injection 100 mcg -     oxyCODONE (ROXICODONE) 5 MG immediate release tablet; Take 1 tablet (5 mg total) by mouth every 6 (six) hours as needed for severe pain.        ----------------------------------------------------------------------------------------------------------------------  Problem List Items Addressed This Visit      Unprioritized   Bilateral low back pain with bilateral sciatica   Relevant Medications   fentaNYL (SUBLIMAZE) injection 100 mcg   oxyCODONE (ROXICODONE) 5 MG immediate release tablet (Start on 03/07/2019)   Cervicalgia   DDD (degenerative disc disease), cervical   Relevant Medications   fentaNYL (SUBLIMAZE) injection 100 mcg   oxyCODONE (ROXICODONE) 5 MG immediate release tablet (Start on 03/07/2019)   Long term current use of opiate analgesic   Lumbar spondylosis    Relevant Medications   fentaNYL (SUBLIMAZE) injection 100 mcg   oxyCODONE (ROXICODONE) 5 MG immediate release tablet (Start on 03/07/2019)    Other Visit Diagnoses    Right sided sciatica    -  Primary   Relevant Medications   midazolam (VERSED) 5 MG/5ML injection 5 mg (Completed)   Weakness of both lower extremities       Right hand pain            ----------------------------------------------------------------------------------------------------------------------  1. Right sided sciatica We will proceed with a repeat epidural injection today.  She is responded favorably to these in the past and we have gone over the risks and benefits of the procedure with her in full detail.  I want her to continue with exercises with stretching strengthening with return to clinic in 2 months.  Hopefully we can defer until February prior  to any repeat injections.  2. Chronic bilateral low back pain with bilateral sciatica As above  3. Lumbar spondylosis As above and continue efforts at weight loss  4. DDD (degenerative disc disease), cervical   5. Long term current use of opiate analgesic I have reviewed the [facility] practitioner database information and it is appropriate.  Refills will be given today for next month with return to clinic in 2 months.  6. Cervicalgia Continue core stretching strengthening.  7. Weakness of both lower extremities   8. Right hand pain     ----------------------------------------------------------------------------------------------------------------------  I am having Kevionna R. Sauseda maintain her ibuprofen, [place] TENS/EMS Combo Unit, ProAir HFA, montelukast, sertraline, oxyCODONE, pregabalin, meloxicam, and oxyCODONE. We administered triamcinolone acetonide, sodium chloride flush, ropivacaine (PF) 2 mg/mL (0.2%), midazolam, lidocaine (PF), and lactated ringers. We will continue to administer ropivacaine (PF) 2 mg/mL (0.2%) and lidocaine  (PF).   Meds ordered this encounter  Medications  . triamcinolone acetonide (KENALOG-40) injection 40 mg  . sodium chloride flush (NS) 0.9 % injection 10 mL  . ropivacaine (PF) 2 mg/mL (0.2%) (NAROPIN) injection 10 mL  . midazolam (VERSED) 5 MG/5ML injection 5 mg  . lidocaine (PF) (XYLOCAINE) 1 % injection 5 mL  . lactated ringers infusion 1,000 mL  . iopamidol (ISOVUE-M) 41 % intrathecal injection 20 mL  . fentaNYL (SUBLIMAZE) injection 100 mcg  . oxyCODONE (ROXICODONE) 5 MG immediate release tablet    Sig: Take 1 tablet (5 mg total) by mouth every 6 (six) hours as needed for severe pain.    Dispense:  120 tablet    Refill:  0    30 day supply.... do not refill early   Patient's Medications  New Prescriptions   No medications on file  Previous Medications   IBUPROFEN (ADVIL,MOTRIN) 200 MG TABLET    Take 200 mg by mouth every 6 (six) hours as needed.   MELOXICAM (MOBIC) 7.5 MG TABLET    Take 1 tablet (7.5 mg total) by mouth 2 (two) times daily.   MONTELUKAST (SINGULAIR) 10 MG TABLET    Take 10 mg by mouth at bedtime.   NERVE STIMULATOR (ZEWA TENS/EMS COMBO UNIT) DEVI    by Does not apply route.   OXYCODONE (ROXICODONE) 5 MG IMMEDIATE RELEASE TABLET    Take 1 tablet (5 mg total) by mouth every 6 (six) hours as needed for severe pain.   PREGABALIN (LYRICA) 75 MG CAPSULE    Take 2 capsules (150 mg total) by mouth 2 (two) times daily.   PROAIR HFA 108 (90 BASE) MCG/ACT INHALER    Inhale 2 puffs into the lungs as needed.   SERTRALINE (ZOLOFT) 50 MG TABLET    Take 50 mg by mouth daily.  Modified Medications   Modified Medication Previous Medication   OXYCODONE (ROXICODONE) 5 MG IMMEDIATE RELEASE TABLET oxyCODONE (ROXICODONE) 5 MG immediate release tablet      Take 1 tablet (5 mg total) by mouth every 6 (six) hours as needed for severe pain.    Take 1 tablet (5 mg total) by mouth every 6 (six) hours as needed for severe pain.  Discontinued Medications   No medications on file    ----------------------------------------------------------------------------------------------------------------------  Follow-up: Return in about 2 months (around 04/13/2019) for med refill, evaluation.   Procedure: L5-S1 LESI with fluoroscopic guidance and with moderate sedation  NOTE: The risks, benefits, and expectations of the procedure have been discussed and explained to the patient who was understanding and in agreement with suggested treatment  plan. No guarantees were made.  DESCRIPTION OF PROCEDURE: Lumbar epidural steroid injection with 3 mg IV Versed, EKG, blood pressure, pulse, and pulse oximetry monitoring. The procedure was performed with the patient in the prone position under fluoroscopic guidance.  Sterile prep x3 was initiated and I then injected subcutaneous lidocaine to the overlying L5-S1 site after its fluoroscopic identifictation.  Using strict aseptic technique, I then advanced an 18-gauge Tuohy epidural needle in the midline using interlaminar approach via loss-of-resistance to saline technique. There was negative aspiration for heme or  CSF.  I then confirmed position with both AP and Lateral fluoroscan.  2 cc of contrast dye were injected and a  total of 5 mL of Preservative-Free normal saline mixed with 40 mg of Kenalog and 1cc Ropicaine 0.2 percent were injected incrementally via the  epidurally placed needle. The needle was removed. The patient tolerated the injection well and was convalesced and discharged to home in stable condition. Should the patient have any post procedure difficulty they have been instructed on how to contact us for assistance.    Molli Barrows, MD

## 2019-02-22 ENCOUNTER — Telehealth: Payer: Self-pay | Admitting: *Deleted

## 2019-02-22 NOTE — Telephone Encounter (Signed)
See note

## 2019-03-10 ENCOUNTER — Telehealth: Payer: Self-pay

## 2019-03-10 NOTE — Telephone Encounter (Signed)
Patient notified that her PA had been sent on 03-08-2019

## 2019-03-10 NOTE — Telephone Encounter (Signed)
She was supposed to pick up her meds on the ninth but still hasnt gotten prior authorization. She was calling to see if it was being done.

## 2019-03-11 ENCOUNTER — Telehealth: Payer: Self-pay

## 2019-03-11 NOTE — Telephone Encounter (Signed)
Patient states she is out of meds already and needs to pick up meds

## 2019-03-11 NOTE — Telephone Encounter (Signed)
Spoke with patient to let her know that previous PA had been sent in incomplete and I will need to resend.  Asked her to please give it 24 hours as that was the turn around time given by representative at [facility].  Patient states she will do that and will call before she goes to pickup medications.

## 2019-03-11 NOTE — Telephone Encounter (Signed)
She called back to check on her prior auth for her meds. She runs out tomorrow and wants to know if it can be expedited.

## 2019-03-24 ENCOUNTER — Telehealth: Payer: Self-pay | Admitting: Anesthesiology

## 2019-03-24 NOTE — Telephone Encounter (Signed)
This morning I received a call from the pharmacy requesting permission (per her med agreement)to fill a script from a Dr. Kym Groom for 5 [facility] tablets written by him for patient's continued oral pain. I spoke with the CMA at the prescribing office to clarify that this Dr. knew that we also gave her pain medication. Message left with CMA and she states she will clarify and call me back.

## 2019-03-24 NOTE — Telephone Encounter (Signed)
Patient called to state she is getting extra pain medications from PCP due to ongoing pain from having wisdom teeth taken out, she has ulcers in her mouth.

## 2019-03-24 NOTE — Telephone Encounter (Signed)
Hannah Maynard spoke with Hannah Maynard from [place] and has information that she is going to investigate with Chief Financial Officer.

## 2019-03-24 NOTE — Telephone Encounter (Signed)
Called Dr. Devona Konig office  [phone] and left message with the CMA. She states will give him the message and clarify and they will call me back.

## 2019-03-24 NOTE — Telephone Encounter (Signed)
[facility] calling to verify ok to fill script

## 2019-03-25 ENCOUNTER — Telehealth: Payer: Self-pay | Admitting: *Deleted

## 2019-04-12 ENCOUNTER — Ambulatory Visit (HOSPITAL_BASED_OUTPATIENT_CLINIC_OR_DEPARTMENT_OTHER): Payer: Medicaid Other | Admitting: Anesthesiology

## 2019-04-12 ENCOUNTER — Other Ambulatory Visit: Payer: Self-pay

## 2019-04-12 DIAGNOSIS — M542 Cervicalgia: Secondary | ICD-10-CM

## 2019-04-12 DIAGNOSIS — M5442 Lumbago with sciatica, left side: Secondary | ICD-10-CM

## 2019-04-12 DIAGNOSIS — Z79891 Long term (current) use of opiate analgesic: Secondary | ICD-10-CM

## 2019-04-12 DIAGNOSIS — G894 Chronic pain syndrome: Secondary | ICD-10-CM

## 2019-04-12 DIAGNOSIS — G8929 Other chronic pain: Secondary | ICD-10-CM

## 2019-04-12 DIAGNOSIS — M5441 Lumbago with sciatica, right side: Secondary | ICD-10-CM

## 2019-04-12 DIAGNOSIS — M5030 Other cervical disc degeneration, unspecified cervical region: Secondary | ICD-10-CM

## 2019-04-12 DIAGNOSIS — M5431 Sciatica, right side: Secondary | ICD-10-CM

## 2019-04-12 DIAGNOSIS — M47816 Spondylosis without myelopathy or radiculopathy, lumbar region: Secondary | ICD-10-CM

## 2019-04-12 DIAGNOSIS — R29898 Other symptoms and signs involving the musculoskeletal system: Secondary | ICD-10-CM

## 2019-04-12 MED ORDER — OXYCODONE HCL 5 MG PO TABS: 5.0000 mg | ORAL_TABLET | Freq: Four times a day (QID) | ORAL | 0 refills | Status: DC | PRN

## 2019-04-13 ENCOUNTER — Ambulatory Visit: Payer: Medicaid Other | Attending: Anesthesiology | Admitting: Anesthesiology

## 2019-04-13 ENCOUNTER — Encounter: Payer: Self-pay | Admitting: Anesthesiology

## 2019-04-13 DIAGNOSIS — M5431 Sciatica, right side: Secondary | ICD-10-CM

## 2019-04-13 DIAGNOSIS — R29898 Other symptoms and signs involving the musculoskeletal system: Secondary | ICD-10-CM

## 2019-04-13 DIAGNOSIS — Z79891 Long term (current) use of opiate analgesic: Secondary | ICD-10-CM

## 2019-04-13 DIAGNOSIS — G894 Chronic pain syndrome: Secondary | ICD-10-CM

## 2019-04-13 DIAGNOSIS — M47816 Spondylosis without myelopathy or radiculopathy, lumbar region: Secondary | ICD-10-CM | POA: Diagnosis not present

## 2019-04-13 DIAGNOSIS — M5442 Lumbago with sciatica, left side: Secondary | ICD-10-CM | POA: Diagnosis not present

## 2019-04-13 DIAGNOSIS — M542 Cervicalgia: Secondary | ICD-10-CM

## 2019-04-13 DIAGNOSIS — G8929 Other chronic pain: Secondary | ICD-10-CM

## 2019-04-13 DIAGNOSIS — M5030 Other cervical disc degeneration, unspecified cervical region: Secondary | ICD-10-CM

## 2019-04-13 DIAGNOSIS — M5441 Lumbago with sciatica, right side: Secondary | ICD-10-CM

## 2019-04-13 MED ORDER — OXYCODONE HCL 5 MG PO TABS: 5.0000 mg | ORAL_TABLET | Freq: Four times a day (QID) | ORAL | 0 refills | Status: AC | PRN

## 2019-04-13 MED ORDER — OXYCODONE HCL 5 MG PO TABS: 5.0000 mg | ORAL_TABLET | Freq: Four times a day (QID) | ORAL | 0 refills | Status: DC | PRN

## 2019-04-13 NOTE — Progress Notes (Signed)
Virtual Visit via Telephone Note  I connected with Hannah Maynard on 04/13/19 at  1:00 PM EST by telephone and verified that I am speaking with the correct person using two identifiers.  Location: Patient: Home Provider: Pain control center   I discussed the limitations, risks, security and privacy concerns of performing an evaluation and management service by telephone and the availability of in person appointments. I also discussed with the patient that there may be a patient responsible charge related to this service. The patient expressed understanding and agreed to proceed.   History of Present Illness: I spoke with [name] today regarding her low back pain and leg pain.  This was via telephone as she was unable to do the video portion of the virtual conference.  She reports a gradual increase in low back pain and leg pain since her last visit.  This is consistent to what she has historically experienced and she receives periodic epidurals for this in addition to pain medication.  Unfortunately she has failed conservative therapy.  At this point she is requesting a repeat epidural in a few weeks.  Her last epidural was in December of last year and she experiences significant improvement in her low back pain rated at 75 to 80% and in her leg pain as well generally lasting 6 to 8 weeks before she gets gradual recurrence of the similar pain.  No new changes in lower extremity strength are noted though she has some chronic lower extremity weakness that she reports.  This is generally of give way weakness with worsening of her prolonged standing.  Her bowel and bladder function have been stable.  Otherwise she reports that she is doing well with her pain medications and these are giving her good relief.  She has had some recent oral surgery and received some short acting additional opioid medications in low quantity for that.  She denies any diverting or illicit use with her medications.   Observations/Objective:  Current Outpatient Medications:  .  ibuprofen (ADVIL,MOTRIN) 200 MG tablet, Take 200 mg by mouth every 6 (six) hours as needed., Disp: , Rfl:  .  montelukast (SINGULAIR) 10 MG tablet, Take 10 mg by mouth at bedtime., Disp: , Rfl:  .  Nerve Stimulator (ZEWA TENS/EMS COMBO UNIT) DEVI, by Does not apply route., Disp: , Rfl:  .  oxyCODONE (ROXICODONE) 5 MG immediate release tablet, Take 1 tablet (5 mg total) by mouth every 6 (six) hours as needed for severe pain., Disp: 120 tablet, Rfl: 0 .  [START ON 05/10/2019] oxyCODONE (ROXICODONE) 5 MG immediate release tablet, Take 1 tablet (5 mg total) by mouth every 6 (six) hours as needed for severe pain., Disp: 120 tablet, Rfl: 0 .  pregabalin (LYRICA) 75 MG capsule, Take 2 capsules (150 mg total) by mouth 2 (two) times daily., Disp: 60 capsule, Rfl: 3 .  PROAIR HFA 108 (90 Base) MCG/ACT inhaler, Inhale 2 puffs into the lungs as needed., Disp: , Rfl:  .  sertraline (ZOLOFT) 50 MG tablet, Take 50 mg by mouth daily., Disp: , Rfl:   Current Facility-Administered Medications:  .  lidocaine (PF) (XYLOCAINE) 1 % injection 5 mL, 5 mL, Subcutaneous, Once, Molli Barrows, MD .  ropivacaine (PF) 2 mg/mL (0.2%) (NAROPIN) injection 10 mL, 10 mL, Epidural, Once, Andree Elk Alvina Filbert, MD  Assessment and Plan: 1. Right sided sciatica   2. Chronic bilateral low back pain with bilateral sciatica   3. Lumbar spondylosis   4. Cervicalgia  5. Long term current use of opiate analgesic   6. Chronic pain syndrome   7. DDD (degenerative disc disease), cervical   8. Chronic neck pain   9. Facet arthritis of lumbar region   10. Weakness of both lower extremities   Based on our discussion today and upon review of the [facility] practitioner database information going to refill her medications for the next 2 months.  This will be dated for today February 16 for 120 tablets of oxycodone and 1 month from now.  Furthermore schedule her for a return to  clinic in a few weeks for an epidural steroid injection.  She has had these in the past and responded very favorably.  Her pain is quite challenging to treat and she is failed conservative therapy.  Epidurals give her good relief and keep her active and functional she reports.  No changes will be made on her medication management.  In the meantime she is to continue follow-up with her primary care physicians for baseline medical care.  I have cautioned her about receiving medications for more than 1 physician opioid-based.  Follow Up Instructions:    I discussed the assessment and treatment plan with the patient. The patient was provided an opportunity to ask questions and all were answered. The patient agreed with the plan and demonstrated an understanding of the instructions.   The patient was advised to call back or seek an in-person evaluation if the symptoms worsen or if the condition fails to improve as anticipated.  I provided 30 minutes of non-face-to-face time during this encounter.   Molli Barrows, MD

## 2019-04-21 ENCOUNTER — Encounter: Payer: Self-pay | Admitting: Anesthesiology

## 2019-04-21 ENCOUNTER — Ambulatory Visit
Admission: RE | Admit: 2019-04-21 | Discharge: 2019-04-21 | Disposition: A | Discharge status: Home / Self Care | Payer: Medicaid Other | Source: Ambulatory Visit | Attending: Anesthesiology | Admitting: Anesthesiology

## 2019-04-21 ENCOUNTER — Ambulatory Visit (HOSPITAL_BASED_OUTPATIENT_CLINIC_OR_DEPARTMENT_OTHER): Payer: Medicaid Other | Admitting: Anesthesiology

## 2019-04-21 ENCOUNTER — Other Ambulatory Visit: Payer: Self-pay | Admitting: Anesthesiology

## 2019-04-21 ENCOUNTER — Other Ambulatory Visit: Payer: Self-pay

## 2019-04-21 VITALS — BP 117/95 | HR 82 | Temp 97.6°F | Resp 15 | Ht 63.0 in

## 2019-04-21 DIAGNOSIS — M5030 Other cervical disc degeneration, unspecified cervical region: Secondary | ICD-10-CM

## 2019-04-21 DIAGNOSIS — M542 Cervicalgia: Secondary | ICD-10-CM | POA: Diagnosis present

## 2019-04-21 DIAGNOSIS — M5441 Lumbago with sciatica, right side: Secondary | ICD-10-CM | POA: Insufficient documentation

## 2019-04-21 DIAGNOSIS — G8929 Other chronic pain: Secondary | ICD-10-CM

## 2019-04-21 DIAGNOSIS — Z79891 Long term (current) use of opiate analgesic: Secondary | ICD-10-CM | POA: Diagnosis present

## 2019-04-21 DIAGNOSIS — M47816 Spondylosis without myelopathy or radiculopathy, lumbar region: Secondary | ICD-10-CM

## 2019-04-21 DIAGNOSIS — G894 Chronic pain syndrome: Secondary | ICD-10-CM | POA: Insufficient documentation

## 2019-04-21 DIAGNOSIS — M5442 Lumbago with sciatica, left side: Secondary | ICD-10-CM

## 2019-04-21 DIAGNOSIS — R52 Pain, unspecified: Secondary | ICD-10-CM

## 2019-04-21 DIAGNOSIS — M5431 Sciatica, right side: Secondary | ICD-10-CM | POA: Insufficient documentation

## 2019-04-21 MED ORDER — LIDOCAINE HCL (PF) 1 % IJ SOLN
5.0000 mL | Freq: Once | INTRAMUSCULAR | Status: AC
  Administered 2019-04-21: 5 mL via SUBCUTANEOUS
  Filled 2019-04-21: qty 5

## 2019-04-21 MED ORDER — ROPIVACAINE HCL 2 MG/ML IJ SOLN
10.0000 mL | Freq: Once | INTRAMUSCULAR | Status: AC
  Administered 2019-04-21: 14:00:00 10 mL via EPIDURAL
  Filled 2019-04-21: qty 10

## 2019-04-21 MED ORDER — IOPAMIDOL (ISOVUE-M 200) INJECTION 41%: 20.0000 mL | Freq: Once | INTRAMUSCULAR | Status: DC | PRN

## 2019-04-21 MED ORDER — OXYCODONE HCL 5 MG PO TABS: 5.0000 mg | ORAL_TABLET | Freq: Four times a day (QID) | ORAL | 0 refills | Status: DC | PRN

## 2019-04-21 MED ORDER — TRIAMCINOLONE ACETONIDE 40 MG/ML IJ SUSP
40.0000 mg | Freq: Once | INTRAMUSCULAR | Status: AC
  Administered 2019-04-21: 14:00:00 40 mg
  Filled 2019-04-21: qty 1

## 2019-04-21 MED ORDER — MIDAZOLAM HCL 5 MG/5ML IJ SOLN
5.0000 mg | Freq: Once | INTRAMUSCULAR | Status: AC
  Administered 2019-04-21: 14:00:00 4 mg via INTRAVENOUS
  Filled 2019-04-21: qty 5

## 2019-04-21 MED ORDER — SODIUM CHLORIDE 0.9% FLUSH
10.0000 mL | Freq: Once | INTRAVENOUS | Status: AC
  Administered 2019-04-21: 14:00:00 10 mL

## 2019-04-21 MED ORDER — IOHEXOL 180 MG/ML  SOLN
INTRAMUSCULAR | Status: AC
  Filled 2019-04-21: qty 20

## 2019-04-21 MED ORDER — LACTATED RINGERS IV SOLN
1000.0000 mL | INTRAVENOUS | Status: DC
  Administered 2019-04-21: 14:00:00 1000 mL via INTRAVENOUS

## 2019-04-21 NOTE — Progress Notes (Signed)
Subjective:  Patient ID: Hannah Maynard, female    DOB: 04-Aug-1965  Age: 54 y.o. MRN: [id number]  CC: Back Pain (lower)   Procedure: L5-S1 epidural steroid under fluoroscopic guidance with moderate sedation  HPI Hannah Maynard presents for reevaluation.  She was last seen a few months ago and has had previous epidurals in the past and presents for that today.  She is describing some low back pain consistent with what she generally experiences with radiation into the posterior right lateral leg.  The pain is described as severe and sometimes incapacitating.  She takes her medications but despite these, the pain persists.  In the past she has had approximately 75 to 80% pain relief of the right lower leg pain lasting approximately 6 to 8 weeks before she gets a gradual recurrence.  Despite trying stretching strengthening exercises and physical therapy type exercises the pain is constant and has been recalcitrant.  She denies any change in the quality characteristic or distribution of the pain or change in lower extremity strength or function.  She occasionally has give way weakness when the pain is severe but this does get better following her epidurals.  Outpatient Medications Prior to Visit  Medication Sig Dispense Refill  . ibuprofen (ADVIL,MOTRIN) 200 MG tablet Take 200 mg by mouth every 6 (six) hours as needed.    . montelukast (SINGULAIR) 10 MG tablet Take 10 mg by mouth at bedtime.    . Nerve Stimulator (ZEWA TENS/EMS COMBO UNIT) DEVI by Does not apply route.    Derrill Memo ON 05/10/2019] oxyCODONE (ROXICODONE) 5 MG immediate release tablet Take 1 tablet (5 mg total) by mouth every 6 (six) hours as needed for severe pain. 120 tablet 0  . PROAIR HFA 108 (90 Base) MCG/ACT inhaler Inhale 2 puffs into the lungs as needed.    . sertraline (ZOLOFT) 50 MG tablet Take 50 mg by mouth daily.    Marland Kitchen oxyCODONE (ROXICODONE) 5 MG immediate release tablet Take 1 tablet (5 mg total) by mouth every 6 (six)  hours as needed for severe pain. 120 tablet 0  . pregabalin (LYRICA) 75 MG capsule Take 2 capsules (150 mg total) by mouth 2 (two) times daily. 60 capsule 3   Facility-Administered Medications Prior to Visit  Medication Dose Route Frequency Provider Last Rate Last Admin  . lidocaine (PF) (XYLOCAINE) 1 % injection 5 mL  5 mL Subcutaneous Once Molli Barrows, MD      . ropivacaine (PF) 2 mg/mL (0.2%) (NAROPIN) injection 10 mL  10 mL Epidural Once Molli Barrows, MD        Review of Systems CNS: No confusion or sedation Cardiac: No angina or palpitations GI: No abdominal pain or constipation Constitutional: No nausea vomiting fevers or chills  Objective:  BP (!) 117/95   Pulse 82   Temp 97.6 F (36.4 C) (Temporal)   Resp 15   Ht 5\' 3"  (1.6 m)   LMP  (LMP Unknown)   SpO2 94%   BMI 38.97 kg/m    BP Readings from Last 3 Encounters:  04/21/19 (!) 117/95  02/10/19 (!) 154/86  12/10/18 132/81     Wt Readings from Last 3 Encounters:  02/10/19 220 lb (99.8 kg)  12/10/18 218 lb (98.9 kg)  10/05/18 224 lb 9.6 oz (101.9 kg)     Physical Exam Pt is alert and oriented PERRL EOMI HEART IS RRR no murmur or rub LCTA no wheezing or rales MUSCULOSKELETAL reveals some paraspinous muscle  tenderness but no overt trigger points.  Her muscle tone and bulk to the lower extremity is as before.  She has a positive straight leg raise that is present on the right side and negative on the left side  Labs  No results found for: HGBA1C Lab Results  Component Value Date   CREATININE 0.85 10/28/2017    -------------------------------------------------------------------------------------------------------------------- Lab Results  Component Value Date   GLUCOSE 108 (H) 10/28/2017   AST 49 (H) 10/28/2017   NA 142 10/28/2017   K 4.4 10/28/2017   CL 107 (H) 10/28/2017   CREATININE 0.85 10/28/2017   BUN 9 10/28/2017     --------------------------------------------------------------------------------------------------------------------- DG PAIN CLINIC C-ARM 1-60 MIN NO REPORT  Result Date: 04/21/2019 Fluoro was used, but no Radiologist interpretation will be provided. Please refer to "NOTES" tab for provider progress note.    Assessment & Plan:   Hannah Maynard was seen today for back pain.  Diagnoses and all orders for this visit:  Chronic bilateral low back pain with bilateral sciatica  Lumbar spondylosis -     Lumbar Epidural Injection  DDD (degenerative disc disease), cervical -     Lumbar Epidural Injection  Right sided sciatica -     Lumbar Epidural Injection  Cervicalgia  Long term current use of opiate analgesic  Chronic pain syndrome  Facet arthritis of lumbar region  Other orders -     triamcinolone acetonide (KENALOG-40) injection 40 mg -     sodium chloride flush (NS) 0.9 % injection 10 mL -     ropivacaine (PF) 2 mg/mL (0.2%) (NAROPIN) injection 10 mL -     midazolam (VERSED) 5 MG/5ML injection 5 mg -     lidocaine (PF) (XYLOCAINE) 1 % injection 5 mL -     lactated ringers infusion 1,000 mL -     iopamidol (ISOVUE-M) 41 % intrathecal injection 20 mL -     oxyCODONE (ROXICODONE) 5 MG immediate release tablet; Take 1 tablet (5 mg total) by mouth every 6 (six) hours as needed for severe pain.        ----------------------------------------------------------------------------------------------------------------------  Problem List Items Addressed This Visit      Unprioritized   Bilateral low back pain with bilateral sciatica - Primary   Relevant Medications   oxyCODONE (ROXICODONE) 5 MG immediate release tablet (Start on 06/10/2019)   Cervicalgia   Chronic pain syndrome   Relevant Medications   oxyCODONE (ROXICODONE) 5 MG immediate release tablet (Start on 06/10/2019)   DDD (degenerative disc disease), cervical   Relevant Medications   oxyCODONE (ROXICODONE) 5 MG  immediate release tablet (Start on 06/10/2019)   Long term current use of opiate analgesic   Lumbar spondylosis   Relevant Medications   oxyCODONE (ROXICODONE) 5 MG immediate release tablet (Start on 06/10/2019)    Other Visit Diagnoses    Right sided sciatica       Relevant Medications   midazolam (VERSED) 5 MG/5ML injection 5 mg (Completed)   Facet arthritis of lumbar region       Relevant Medications   triamcinolone acetonide (KENALOG-40) injection 40 mg (Completed)   oxyCODONE (ROXICODONE) 5 MG immediate release tablet (Start on 06/10/2019)        ----------------------------------------------------------------------------------------------------------------------  1. Lumbar spondylosis  - Lumbar Epidural Injection  2. DDD (degenerative disc disease), cervical We will proceed with stretching exercises as previously requested.   3. Right sided sciatica We will proceed with a repeat epidural today.  I have  gone over the risks and benefits with  her in full detail and all questions answered.  Want her to continue with stretching strengthening exercises and return to clinic in 2 months. - Lumbar Epidural Injection  4. Chronic bilateral low back pain with bilateral sciatica As above  5. Cervicalgia Continue exercises as described  6. Long term current use of opiate analgesic I have reviewed the [facility] practitioner database information and it is appropriate.  She is currently doing well with her opioid medications whereas other [place] more conservative more conservative medications were ineffective for her.  We will schedule her for refill March 16 and April 15  7. Chronic pain syndrome As above  8. Facet arthritis of lumbar region As above    ----------------------------------------------------------------------------------------------------------------------  I am having Hannah Maynard maintain her ibuprofen, [place] TENS/EMS Combo Unit, ProAir  HFA, montelukast, sertraline, pregabalin, oxyCODONE, and oxyCODONE. We administered triamcinolone acetonide, sodium chloride flush, ropivacaine (PF) 2 mg/mL (0.2%), midazolam, lidocaine (PF), and lactated ringers. We will continue to administer ropivacaine (PF) 2 mg/mL (0.2%) and lidocaine (PF).   Meds ordered this encounter  Medications  . triamcinolone acetonide (KENALOG-40) injection 40 mg  . sodium chloride flush (NS) 0.9 % injection 10 mL  . ropivacaine (PF) 2 mg/mL (0.2%) (NAROPIN) injection 10 mL  . midazolam (VERSED) 5 MG/5ML injection 5 mg  . lidocaine (PF) (XYLOCAINE) 1 % injection 5 mL  . lactated ringers infusion 1,000 mL  . iopamidol (ISOVUE-M) 41 % intrathecal injection 20 mL  . oxyCODONE (ROXICODONE) 5 MG immediate release tablet    Sig: Take 1 tablet (5 mg total) by mouth every 6 (six) hours as needed for severe pain.    Dispense:  120 tablet    Refill:  0   Patient's Medications  New Prescriptions   No medications on file  Previous Medications   IBUPROFEN (ADVIL,MOTRIN) 200 MG TABLET    Take 200 mg by mouth every 6 (six) hours as needed.   MONTELUKAST (SINGULAIR) 10 MG TABLET    Take 10 mg by mouth at bedtime.   NERVE STIMULATOR (ZEWA TENS/EMS COMBO UNIT) DEVI    by Does not apply route.   OXYCODONE (ROXICODONE) 5 MG IMMEDIATE RELEASE TABLET    Take 1 tablet (5 mg total) by mouth every 6 (six) hours as needed for severe pain.   PREGABALIN (LYRICA) 75 MG CAPSULE    Take 2 capsules (150 mg total) by mouth 2 (two) times daily.   PROAIR HFA 108 (90 BASE) MCG/ACT INHALER    Inhale 2 puffs into the lungs as needed.   SERTRALINE (ZOLOFT) 50 MG TABLET    Take 50 mg by mouth daily.  Modified Medications   Modified Medication Previous Medication   OXYCODONE (ROXICODONE) 5 MG IMMEDIATE RELEASE TABLET oxyCODONE (ROXICODONE) 5 MG immediate release tablet      Take 1 tablet (5 mg total) by mouth every 6 (six) hours as needed for severe pain.    Take 1 tablet (5 mg total) by mouth  every 6 (six) hours as needed for severe pain.  Discontinued Medications   No medications on file   ----------------------------------------------------------------------------------------------------------------------  Follow-up: Return in about 2 months (around 06/19/2019) for evaluation, med refill.   Procedure: L5-S1 LESI with fluoroscopic guidance and with moderate sedation  NOTE: The risks, benefits, and expectations of the procedure have been discussed and explained to the patient who was understanding and in agreement with suggested treatment plan. No guarantees were made.  DESCRIPTION OF PROCEDURE: Lumbar epidural steroid injection with  4 mg IV Versed, EKG, blood pressure, pulse, and pulse oximetry monitoring. The procedure was performed with the patient in the prone position under fluoroscopic guidance.  Sterile prep x3 was initiated and I then injected subcutaneous lidocaine to the overlying L5-S1 site after its fluoroscopic identifictation.  Using strict aseptic technique, I then advanced an 18-gauge Tuohy epidural needle in the midline using interlaminar approach via loss-of-resistance to saline technique. There was negative aspiration for heme or  CSF.  I then confirmed position with both AP and Lateral fluoroscan.  2 cc of contrast dye were injected and a  total of 5 mL of Preservative-Free normal saline mixed with 40 mg of Kenalog and 1cc Ropicaine 0.2 percent were injected incrementally via the  epidurally placed needle. The needle was removed. The patient tolerated the injection well and was convalesced and discharged to home in stable condition. Should the patient have any post procedure difficulty they have been instructed on how to contact us for assistance.    Molli Barrows, MD

## 2019-04-21 NOTE — Progress Notes (Signed)
Safety precautions to be maintained throughout the outpatient stay will include: orient to surroundings, keep bed in low position, maintain call bell within reach at all times, provide assistance with transfer out of bed and ambulation.  

## 2019-04-21 NOTE — Patient Instructions (Signed)

## 2019-05-13 ENCOUNTER — Other Ambulatory Visit: Payer: Self-pay | Admitting: Nurse Practitioner

## 2019-05-17 ENCOUNTER — Telehealth: Payer: Self-pay | Admitting: Anesthesiology

## 2019-05-17 NOTE — Telephone Encounter (Addendum)
Patient states she is out Lyrica. Script was supposed to be sent in on 04-12-18 but has not been received by pharmacy. Also she is changing to [facility] in [place] and [facility].

## 2019-05-17 NOTE — Telephone Encounter (Signed)
Pharmacy called. They have script ready for LYRICA to be picked up. Dr. Andree Elk left a note to refill Robaxin. Pateint called. Pharmacy changed to [facility] in [place] per patient request.

## 2019-05-27 ENCOUNTER — Ambulatory Visit: Payer: Self-pay | Attending: Internal Medicine

## 2019-05-27 ENCOUNTER — Other Ambulatory Visit: Payer: Self-pay

## 2019-05-27 DIAGNOSIS — Z23 Encounter for immunization: Secondary | ICD-10-CM

## 2019-05-27 NOTE — Progress Notes (Signed)
   Covid-19 Vaccination Clinic  Name:  Hannah Maynard    MRN: [id number] DOB: Aug 17, 1965  05/27/2019  [name] was observed post Covid-19 immunization for 15 minutes without incident. She was provided with Vaccine Information Sheet and instruction to access the V-Safe system.   [name] was instructed to call 911 with any severe reactions post vaccine: Marland Kitchen Difficulty breathing  . Swelling of face and throat  . A fast heartbeat  . A bad rash all over body  . Dizziness and weakness   Immunizations Administered    Name Date Dose VIS Date Route   Pfizer COVID-19 Vaccine 05/27/2019  9:01 AM 0.3 mL 02/05/2019 Intramuscular   Manufacturer: [place]   Lot: [id number]   [place]: [id number]

## 2019-06-07 ENCOUNTER — Telehealth: Payer: Self-pay | Admitting: Anesthesiology

## 2019-06-07 NOTE — Telephone Encounter (Addendum)
Patient called pharmacy to get script ready for pick up on 15th and was told they did not have a script there. Please call pharmacy and check on this and let patient know status.  Thank you

## 2019-06-07 NOTE — Telephone Encounter (Signed)
Patient states she called pharmacy to let them know she was going to pick up meds on 15th and was told they did not have a script for April. Please check

## 2019-06-07 NOTE — Telephone Encounter (Signed)
Patient notified that there was a script for oxycodone to be picked up.

## 2019-06-15 ENCOUNTER — Ambulatory Visit: Payer: Medicaid Other | Attending: Anesthesiology | Admitting: Anesthesiology

## 2019-06-15 ENCOUNTER — Encounter: Payer: Self-pay | Admitting: Anesthesiology

## 2019-06-15 ENCOUNTER — Other Ambulatory Visit: Payer: Self-pay

## 2019-06-15 DIAGNOSIS — M542 Cervicalgia: Secondary | ICD-10-CM

## 2019-06-15 DIAGNOSIS — M5442 Lumbago with sciatica, left side: Secondary | ICD-10-CM | POA: Diagnosis not present

## 2019-06-15 DIAGNOSIS — G894 Chronic pain syndrome: Secondary | ICD-10-CM

## 2019-06-15 DIAGNOSIS — Z79891 Long term (current) use of opiate analgesic: Secondary | ICD-10-CM

## 2019-06-15 DIAGNOSIS — M5441 Lumbago with sciatica, right side: Secondary | ICD-10-CM

## 2019-06-15 DIAGNOSIS — M5431 Sciatica, right side: Secondary | ICD-10-CM

## 2019-06-15 DIAGNOSIS — M47816 Spondylosis without myelopathy or radiculopathy, lumbar region: Secondary | ICD-10-CM

## 2019-06-15 DIAGNOSIS — G8929 Other chronic pain: Secondary | ICD-10-CM

## 2019-06-15 DIAGNOSIS — M5030 Other cervical disc degeneration, unspecified cervical region: Secondary | ICD-10-CM

## 2019-06-15 MED ORDER — PREGABALIN 75 MG PO CAPS: 150.0000 mg | ORAL_CAPSULE | Freq: Two times a day (BID) | ORAL | 3 refills | Status: DC

## 2019-06-15 MED ORDER — OXYCODONE HCL 5 MG PO TABS: 5.0000 mg | ORAL_TABLET | Freq: Four times a day (QID) | ORAL | 0 refills | Status: DC | PRN

## 2019-06-15 MED ORDER — METHOCARBAMOL 750 MG PO TABS: 750.0000 mg | ORAL_TABLET | Freq: Three times a day (TID) | ORAL | 2 refills | Status: AC

## 2019-06-15 NOTE — Progress Notes (Signed)
Virtual Visit via Telephone Note  I connected with Hannah Maynard on 06/15/19 at 11:45 AM EDT by telephone and verified that I am speaking with the correct person using two identifiers.  Location: Patient: Home Provider: Pain control center   I discussed the limitations, risks, security and privacy concerns of performing an evaluation and management service by telephone and the availability of in person appointments. I also discussed with the patient that there may be a patient responsible charge related to this service. The patient expressed understanding and agreed to proceed.   History of Present Illness: I spoke with Hannah Maynard today via telephone as she was unable to do the video portion of the virtual conference.  She states that she is having quite a bit more low back pain with right posterior lateral leg pain comparable to what she is had in the past.  Her last epidural back in February gave her significant improvement rated at 75 to 80% and generalized about 6 to 7 weeks before she gets gradual recurrence of pain.  She reports that she is doing her stretching exercises but Covid has restricted her to some extent.  She is taking her muscle relaxants but these are not giving her the degree of relief that she desires.  She still taking her oxycodone effectively with no side effects and this continues to give her good relief and help with pain management overall.  Otherwise she is in her usual state of health no change in bowel or bladder function or lower extremity strength or function.   Observations/Objective:   Current Outpatient Medications:  .  ibuprofen (ADVIL,MOTRIN) 200 MG tablet, Take 200 mg by mouth every 6 (six) hours as needed., Disp: , Rfl:  .  methocarbamol (ROBAXIN) 750 MG tablet, Take 1 tablet (750 mg total) by mouth 3 (three) times daily., Disp: 90 tablet, Rfl: 2 .  montelukast (SINGULAIR) 10 MG tablet, Take 10 mg by mouth at bedtime., Disp: , Rfl:  .  Nerve Stimulator  (ZEWA TENS/EMS COMBO UNIT) DEVI, by Does not apply route., Disp: , Rfl:  .  [START ON 07/10/2019] oxyCODONE (ROXICODONE) 5 MG immediate release tablet, Take 1 tablet (5 mg total) by mouth every 6 (six) hours as needed for severe pain., Disp: 120 tablet, Rfl: 0 .  pregabalin (LYRICA) 75 MG capsule, Take 2 capsules (150 mg total) by mouth 2 (two) times daily., Disp: 120 capsule, Rfl: 3 .  PROAIR HFA 108 (90 Base) MCG/ACT inhaler, Inhale 2 puffs into the lungs as needed., Disp: , Rfl:  .  sertraline (ZOLOFT) 50 MG tablet, Take 50 mg by mouth daily., Disp: , Rfl:   Current Facility-Administered Medications:  .  lidocaine (PF) (XYLOCAINE) 1 % injection 5 mL, 5 mL, Subcutaneous, Once, Molli Barrows, MD .  ropivacaine (PF) 2 mg/mL (0.2%) (NAROPIN) injection 10 mL, 10 mL, Epidural, Once, Andree Elk Alvina Filbert, MD Assessment and Plan: 1. Lumbar spondylosis   2. DDD (degenerative disc disease), cervical   3. Chronic bilateral low back pain with bilateral sciatica   4. Cervicalgia   5. Chronic pain syndrome   6. Long term current use of opiate analgesic   7. Right sided sciatica   Based on our discussion today I am going to refill her medications.  I have reviewed the [facility] practitioner database information and it is appropriate.  Refill will be given for May 15.  In the meantime I want her to continue with her current medication management.  I am  also refilling her Lyrica for 2 tablets twice daily which seems to help her.  Also the Robaxin moving the strength a 750 3 times daily dosing.  Continue stretching strengthening exercises as reviewed again today and we will schedule her for an epidural at the next available date.  Hopefully we can do this within the next few weeks.  She is to continue follow-up with her primary care physicians as well.    Follow Up Instructions:    I discussed the assessment and treatment plan with the patient. The patient was provided an opportunity to ask questions and all  were answered. The patient agreed with the plan and demonstrated an understanding of the instructions.   The patient was advised to call back or seek an in-person evaluation if the symptoms worsen or if the condition fails to improve as anticipated.  I provided 30 minutes of non-face-to-face time during this encounter.   Molli Barrows, MD

## 2019-06-16 ENCOUNTER — Ambulatory Visit: Payer: Medicaid Other | Admitting: Anesthesiology

## 2019-06-17 ENCOUNTER — Other Ambulatory Visit: Payer: Self-pay | Admitting: Physician Assistant

## 2019-06-17 ENCOUNTER — Ambulatory Visit: Payer: Medicaid Other | Admitting: Student in an Organized Health Care Education/Training Program

## 2019-06-17 ENCOUNTER — Encounter: Payer: Medicaid Other | Admitting: Student in an Organized Health Care Education/Training Program

## 2019-06-17 DIAGNOSIS — E041 Nontoxic single thyroid nodule: Secondary | ICD-10-CM

## 2019-06-17 DIAGNOSIS — Z1231 Encounter for screening mammogram for malignant neoplasm of breast: Secondary | ICD-10-CM

## 2019-06-18 ENCOUNTER — Ambulatory Visit: Payer: Self-pay | Attending: Internal Medicine

## 2019-06-18 DIAGNOSIS — Z23 Encounter for immunization: Secondary | ICD-10-CM

## 2019-06-18 NOTE — Progress Notes (Signed)
   Covid-19 Vaccination Clinic  Name:  Hannah Maynard    MRN: [id number] DOB: 13-Aug-1965  06/18/2019  [name] was observed post Covid-19 immunization for 15 minutes without incident. She was provided with Vaccine Information Sheet and instruction to access the V-Safe system.   [name] was instructed to call 911 with any severe reactions post vaccine: Marland Kitchen Difficulty breathing  . Swelling of face and throat  . A fast heartbeat  . A bad rash all over body  . Dizziness and weakness   Immunizations Administered    Name Date Dose VIS Date Route   Pfizer COVID-19 Vaccine 06/18/2019  3:32 PM 0.3 mL 04/21/2018 Intramuscular   Manufacturer: [employer]   Lot: [id number]   [place]: [id number]

## 2019-06-23 ENCOUNTER — Ambulatory Visit: Payer: Self-pay

## 2019-06-28 ENCOUNTER — Ambulatory Visit
Admission: RE | Admit: 2019-06-28 | Discharge: 2019-06-28 | Disposition: A | Discharge status: Home / Self Care | Payer: Medicaid Other | Source: Ambulatory Visit | Attending: Anesthesiology | Admitting: Anesthesiology

## 2019-06-28 ENCOUNTER — Other Ambulatory Visit: Payer: Self-pay

## 2019-06-28 ENCOUNTER — Encounter: Payer: Self-pay | Admitting: Anesthesiology

## 2019-06-28 ENCOUNTER — Ambulatory Visit: Payer: Medicaid Other | Admitting: Anesthesiology

## 2019-06-28 ENCOUNTER — Other Ambulatory Visit: Payer: Self-pay | Admitting: Anesthesiology

## 2019-06-28 VITALS — BP 134/96 | HR 70 | Temp 97.3°F | Resp 14 | Ht 65.0 in | Wt 213.0 lb

## 2019-06-28 DIAGNOSIS — R29898 Other symptoms and signs involving the musculoskeletal system: Secondary | ICD-10-CM | POA: Insufficient documentation

## 2019-06-28 DIAGNOSIS — M5431 Sciatica, right side: Secondary | ICD-10-CM | POA: Insufficient documentation

## 2019-06-28 DIAGNOSIS — M5030 Other cervical disc degeneration, unspecified cervical region: Secondary | ICD-10-CM | POA: Diagnosis not present

## 2019-06-28 DIAGNOSIS — M5442 Lumbago with sciatica, left side: Secondary | ICD-10-CM | POA: Diagnosis not present

## 2019-06-28 DIAGNOSIS — M542 Cervicalgia: Secondary | ICD-10-CM | POA: Diagnosis not present

## 2019-06-28 DIAGNOSIS — G894 Chronic pain syndrome: Secondary | ICD-10-CM | POA: Insufficient documentation

## 2019-06-28 DIAGNOSIS — M5441 Lumbago with sciatica, right side: Secondary | ICD-10-CM | POA: Insufficient documentation

## 2019-06-28 DIAGNOSIS — G8929 Other chronic pain: Secondary | ICD-10-CM

## 2019-06-28 DIAGNOSIS — R52 Pain, unspecified: Secondary | ICD-10-CM

## 2019-06-28 DIAGNOSIS — Z79891 Long term (current) use of opiate analgesic: Secondary | ICD-10-CM | POA: Diagnosis present

## 2019-06-28 DIAGNOSIS — M47816 Spondylosis without myelopathy or radiculopathy, lumbar region: Secondary | ICD-10-CM

## 2019-06-28 MED ORDER — IOHEXOL 180 MG/ML  SOLN
10.0000 mL | Freq: Once | INTRAMUSCULAR | Status: AC | PRN
  Administered 2019-06-28: 10 mL via EPIDURAL

## 2019-06-28 MED ORDER — LIDOCAINE HCL (PF) 1 % IJ SOLN
5.0000 mL | Freq: Once | INTRAMUSCULAR | Status: AC
  Administered 2019-06-28: 5 mL via SUBCUTANEOUS

## 2019-06-28 MED ORDER — OXYCODONE HCL 5 MG PO TABS: 5.0000 mg | ORAL_TABLET | Freq: Four times a day (QID) | ORAL | 0 refills | Status: DC | PRN

## 2019-06-28 MED ORDER — LIDOCAINE HCL (PF) 1 % IJ SOLN
INTRAMUSCULAR | Status: AC
  Filled 2019-06-28: qty 5

## 2019-06-28 MED ORDER — ROPIVACAINE HCL 2 MG/ML IJ SOLN
INTRAMUSCULAR | Status: AC
  Filled 2019-06-28: qty 10

## 2019-06-28 MED ORDER — SODIUM CHLORIDE (PF) 0.9 % IJ SOLN
INTRAMUSCULAR | Status: AC
  Filled 2019-06-28: qty 10

## 2019-06-28 MED ORDER — TRIAMCINOLONE ACETONIDE 40 MG/ML IJ SUSP
INTRAMUSCULAR | Status: AC
  Filled 2019-06-28: qty 1

## 2019-06-28 MED ORDER — ROPIVACAINE HCL 2 MG/ML IJ SOLN
10.0000 mL | Freq: Once | INTRAMUSCULAR | Status: AC
  Administered 2019-06-28: 10 mL via EPIDURAL

## 2019-06-28 MED ORDER — SODIUM CHLORIDE 0.9% FLUSH
10.0000 mL | Freq: Once | INTRAVENOUS | Status: AC
  Administered 2019-06-28: 10 mL

## 2019-06-28 MED ORDER — TRIAMCINOLONE ACETONIDE 40 MG/ML IJ SUSP
40.0000 mg | Freq: Once | INTRAMUSCULAR | Status: AC
  Administered 2019-06-28: 40 mg

## 2019-06-28 NOTE — Patient Instructions (Signed)

## 2019-06-28 NOTE — Progress Notes (Signed)
Safety precautions to be maintained throughout the outpatient stay will include: orient to surroundings, keep bed in low position, maintain call bell within reach at all times, provide assistance with transfer out of bed and ambulation.  

## 2019-06-28 NOTE — Progress Notes (Signed)
Subjective:  Patient ID: Hannah Maynard, female    DOB: 08-31-1965  Age: 54 y.o. MRN: [id number]  CC: Back Pain (lower)   Procedure: L5-S1 epidural steroid and fluoroscopic guidance with no sedation  HPI Hannah Maynard presents for reevaluation.  She was last seen back in April and secondary to severe incapacitating anterior thigh pain and low back pain she presents today for a repeat epidural injection.  She has had these in the past and generally gets 75 to 80% improvement in her low back pain and leg pain that lasts 3 to 6 weeks before she gets a gradual slow recurrence of the same quality pain.  Most recently she has been experiencing bilateral anterior thigh pain with pain that also radiates from the low back into the calves and occasionally both hips.  This pain has been more severe recently.  She takes her medications and these do help.  However this pain has outstripped the medications capacity and she desires to proceed with a repeat epidural.  She denies any change in bowel or bladder function at this time.  She is using a cane for ambulation and has had some give way weakness as reported today.  Outpatient Medications Prior to Visit  Medication Sig Dispense Refill  . ibuprofen (ADVIL,MOTRIN) 200 MG tablet Take 200 mg by mouth every 6 (six) hours as needed.    . methocarbamol (ROBAXIN) 750 MG tablet Take 1 tablet (750 mg total) by mouth 3 (three) times daily. 90 tablet 2  . montelukast (SINGULAIR) 10 MG tablet Take 10 mg by mouth at bedtime.    . Nerve Stimulator (ZEWA TENS/EMS COMBO UNIT) DEVI by Does not apply route.    . pregabalin (LYRICA) 75 MG capsule Take 2 capsules (150 mg total) by mouth 2 (two) times daily. 120 capsule 3  . PROAIR HFA 108 (90 Base) MCG/ACT inhaler Inhale 2 puffs into the lungs as needed.    . sertraline (ZOLOFT) 50 MG tablet Take 200 mg by mouth daily.     Derrill Memo ON 07/10/2019] oxyCODONE (ROXICODONE) 5 MG immediate release tablet Take 1 tablet (5 mg  total) by mouth every 6 (six) hours as needed for severe pain. 120 tablet 0   Facility-Administered Medications Prior to Visit  Medication Dose Route Frequency Provider Last Rate Last Admin  . lidocaine (PF) (XYLOCAINE) 1 % injection 5 mL  5 mL Subcutaneous Once Molli Barrows, MD      . ropivacaine (PF) 2 mg/mL (0.2%) (NAROPIN) injection 10 mL  10 mL Epidural Once Molli Barrows, MD        Review of Systems CNS: No confusion or sedation Cardiac: No angina or palpitations GI: No abdominal pain or constipation Constitutional: No nausea vomiting fevers or chills  Objective:  BP (!) 134/96   Pulse 70   Temp (!) 97.3 F (36.3 C)   Resp 14   Ht 5\' 5"  (1.651 m)   Wt 213 lb (96.6 kg)   LMP  (LMP Unknown)   SpO2 100%   BMI 35.45 kg/m    BP Readings from Last 3 Encounters:  06/28/19 (!) 134/96  04/21/19 (!) 117/95  02/10/19 (!) 154/86     Wt Readings from Last 3 Encounters:  06/28/19 213 lb (96.6 kg)  02/10/19 220 lb (99.8 kg)  12/10/18 218 lb (98.9 kg)     Physical Exam Pt is alert and oriented PERRL EOMI HEART IS RRR no murmur or rub LCTA no wheezing or rales  MUSCULOSKELETAL reveals some paraspinous muscle tenderness but no overt trigger points.  She walks with a severely antalgic gait and is using a cane for support.  She favors her right leg.  She has a positive straight leg raise on the right side.  She also still has some pain on extension at the low back while standing with bilateral rotation.  Labs  No results found for: HGBA1C Lab Results  Component Value Date   CREATININE 0.85 10/28/2017    -------------------------------------------------------------------------------------------------------------------- Lab Results  Component Value Date   GLUCOSE 108 (H) 10/28/2017   AST 49 (H) 10/28/2017   NA 142 10/28/2017   K 4.4 10/28/2017   CL 107 (H) 10/28/2017   CREATININE 0.85 10/28/2017   BUN 9 10/28/2017     --------------------------------------------------------------------------------------------------------------------- DG PAIN CLINIC C-ARM 1-60 MIN NO REPORT  Result Date: 06/28/2019 Fluoro was used, but no Radiologist interpretation will be provided. Please refer to "NOTES" tab for provider progress note.    Assessment & Plan:   Hannah Maynard was seen today for back pain.  Diagnoses and all orders for this visit:  Lumbar spondylosis  DDD (degenerative disc disease), cervical  Chronic bilateral low back pain with bilateral sciatica  Cervicalgia  Chronic pain syndrome -     ToxASSURE Select 13 (MW), Urine  Long term current use of opiate analgesic -     ToxASSURE Select 13 (MW), Urine  Right sided sciatica  Facet arthritis of lumbar region  Chronic neck pain  Weakness of both lower extremities  Other orders -     oxyCODONE (ROXICODONE) 5 MG immediate release tablet; Take 1 tablet (5 mg total) by mouth every 6 (six) hours as needed for severe pain. -     triamcinolone acetonide (KENALOG-40) injection 40 mg -     sodium chloride flush (NS) 0.9 % injection 10 mL -     ropivacaine (PF) 2 mg/mL (0.2%) (NAROPIN) injection 10 mL -     lidocaine (PF) (XYLOCAINE) 1 % injection 5 mL -     iohexol (OMNIPAQUE) 180 MG/ML injection 10 mL        ----------------------------------------------------------------------------------------------------------------------  Problem List Items Addressed This Visit      Unprioritized   Cervicalgia   Chronic low back pain (Chronic)   Relevant Medications   oxyCODONE (ROXICODONE) 5 MG immediate release tablet (Start on 08/09/2019)   triamcinolone acetonide (KENALOG-40) injection 40 mg   Chronic neck pain   Relevant Medications   oxyCODONE (ROXICODONE) 5 MG immediate release tablet (Start on 08/09/2019)   triamcinolone acetonide (KENALOG-40) injection 40 mg   ropivacaine (PF) 2 mg/mL (0.2%) (NAROPIN) injection 10 mL   lidocaine (PF)  (XYLOCAINE) 1 % injection 5 mL   Chronic pain syndrome   Relevant Medications   oxyCODONE (ROXICODONE) 5 MG immediate release tablet (Start on 08/09/2019)   triamcinolone acetonide (KENALOG-40) injection 40 mg   ropivacaine (PF) 2 mg/mL (0.2%) (NAROPIN) injection 10 mL   lidocaine (PF) (XYLOCAINE) 1 % injection 5 mL   Other Relevant Orders   ToxASSURE Select 13 (MW), Urine   DDD (degenerative disc disease), cervical   Relevant Medications   oxyCODONE (ROXICODONE) 5 MG immediate release tablet (Start on 08/09/2019)   triamcinolone acetonide (KENALOG-40) injection 40 mg   Long term current use of opiate analgesic   Relevant Orders   ToxASSURE Select 13 (MW), Urine   Lumbar spondylosis - Primary   Relevant Medications   oxyCODONE (ROXICODONE) 5 MG immediate release tablet (Start on 08/09/2019)   triamcinolone  acetonide (KENALOG-40) injection 40 mg    Other Visit Diagnoses    Right sided sciatica       Facet arthritis of lumbar region       Relevant Medications   oxyCODONE (ROXICODONE) 5 MG immediate release tablet (Start on 08/09/2019)   triamcinolone acetonide (KENALOG-40) injection 40 mg   Weakness of both lower extremities            ----------------------------------------------------------------------------------------------------------------------  1. Lumbar spondylosis Continue with core stretching strengthening exercises and efforts at weight loss.  We will continue on her current regimen for pain medication as well.  She reports getting good relief with the medicines and no untoward side effects.  2. DDD (degenerative disc disease), cervical As above  3. Chronic bilateral low back pain with bilateral sciatica We will proceed with a repeat L5-S1 epidural today.  I will schedule her for return to clinic in 2 months and likely a repeat epidural 3 months from now consistent with her general program.  Unfortunately she has failed more conservative therapy.  However she does  continue to get good relief with the epidurals  4. Cervicalgia   5. Chronic pain syndrome I have reviewed the [facility] practitioner database information and is appropriate for refills dated for May 15 and June 14. - ToxASSURE Select 13 (MW), Urine  6. Long term current use of opiate analgesic As above - ToxASSURE Select 13 (MW), Urine  7. Right sided sciatica As above  8. Facet arthritis of lumbar region Stability she may be a candidate for repeat facet injection.  9. Chronic neck pain   10. Weakness of both lower extremities Her MRI was once again reviewed from 2017.  She denies any new significant changes from that date.    ----------------------------------------------------------------------------------------------------------------------  I am having Hannah Maynard maintain her ibuprofen, [place] TENS/EMS Combo Unit, ProAir HFA, montelukast, sertraline, pregabalin, methocarbamol, and oxyCODONE. We will continue to administer ropivacaine (PF) 2 mg/mL (0.2%) and lidocaine (PF).   Meds ordered this encounter  Medications  . oxyCODONE (ROXICODONE) 5 MG immediate release tablet    Sig: Take 1 tablet (5 mg total) by mouth every 6 (six) hours as needed for severe pain.    Dispense:  120 tablet    Refill:  0  . triamcinolone acetonide (KENALOG-40) injection 40 mg  . sodium chloride flush (NS) 0.9 % injection 10 mL  . ropivacaine (PF) 2 mg/mL (0.2%) (NAROPIN) injection 10 mL  . lidocaine (PF) (XYLOCAINE) 1 % injection 5 mL  . iohexol (OMNIPAQUE) 180 MG/ML injection 10 mL   Patient's Medications  New Prescriptions   No medications on file  Previous Medications   IBUPROFEN (ADVIL,MOTRIN) 200 MG TABLET    Take 200 mg by mouth every 6 (six) hours as needed.   METHOCARBAMOL (ROBAXIN) 750 MG TABLET    Take 1 tablet (750 mg total) by mouth 3 (three) times daily.   MONTELUKAST (SINGULAIR) 10 MG TABLET    Take 10 mg by mouth at bedtime.   NERVE STIMULATOR (ZEWA TENS/EMS  COMBO UNIT) DEVI    by Does not apply route.   PREGABALIN (LYRICA) 75 MG CAPSULE    Take 2 capsules (150 mg total) by mouth 2 (two) times daily.   PROAIR HFA 108 (90 BASE) MCG/ACT INHALER    Inhale 2 puffs into the lungs as needed.   SERTRALINE (ZOLOFT) 50 MG TABLET    Take 200 mg by mouth daily.   Modified Medications   Modified Medication Previous  Medication   OXYCODONE (ROXICODONE) 5 MG IMMEDIATE RELEASE TABLET oxyCODONE (ROXICODONE) 5 MG immediate release tablet      Take 1 tablet (5 mg total) by mouth every 6 (six) hours as needed for severe pain.    Take 1 tablet (5 mg total) by mouth every 6 (six) hours as needed for severe pain.  Discontinued Medications   No medications on file   ----------------------------------------------------------------------------------------------------------------------  Follow-up: Return in about 2 months (around 08/28/2019) for evaluation, med refill.   Procedure: L5-S1 LESI with fluoroscopic guidance and no moderate sedation  NOTE: The risks, benefits, and expectations of the procedure have been discussed and explained to the patient who was understanding and in agreement with suggested treatment plan. No guarantees were made.  DESCRIPTION OF PROCEDURE: Lumbar epidural steroid injection with no IV Versed, EKG, blood pressure, pulse, and pulse oximetry monitoring. The procedure was performed with the patient in the prone position under fluoroscopic guidance.  Sterile prep x3 was initiated and I then injected subcutaneous lidocaine to the overlying L5-S1 site after its fluoroscopic identifictation.  Using strict aseptic technique, I then advanced an 18-gauge Tuohy epidural needle in the midline using interlaminar approach via loss-of-resistance to saline technique. There was negative aspiration for heme or  CSF.  I then confirmed position with both AP and Lateral fluoroscan.  2 cc of contrast dye were injected and a  total of 5 mL of Preservative-Free normal  saline mixed with 40 mg of Kenalog and 1cc Ropicaine 0.2 percent were injected incrementally via the  epidurally placed needle. The needle was removed. The patient tolerated the injection well and was convalesced and discharged to home in stable condition. Should the patient have any post procedure difficulty they have been instructed on how to contact us for assistance.    Molli Barrows, MD

## 2019-06-30 ENCOUNTER — Emergency Department
Admission: EM | Admit: 2019-06-30 | Discharge: 2019-06-30 | Disposition: A | Discharge status: Home / Self Care | Payer: No Typology Code available for payment source | Attending: Emergency Medicine | Admitting: Emergency Medicine

## 2019-06-30 ENCOUNTER — Emergency Department: Payer: No Typology Code available for payment source

## 2019-06-30 ENCOUNTER — Other Ambulatory Visit: Payer: Self-pay

## 2019-06-30 ENCOUNTER — Encounter: Payer: Self-pay | Admitting: Emergency Medicine

## 2019-06-30 ENCOUNTER — Ambulatory Visit: Payer: Medicaid Other

## 2019-06-30 DIAGNOSIS — S0990XA Unspecified injury of head, initial encounter: Secondary | ICD-10-CM | POA: Diagnosis present

## 2019-06-30 DIAGNOSIS — S0083XA Contusion of other part of head, initial encounter: Secondary | ICD-10-CM | POA: Diagnosis not present

## 2019-06-30 DIAGNOSIS — Y92410 Unspecified street and highway as the place of occurrence of the external cause: Secondary | ICD-10-CM | POA: Diagnosis not present

## 2019-06-30 DIAGNOSIS — Y939 Activity, unspecified: Secondary | ICD-10-CM | POA: Diagnosis not present

## 2019-06-30 DIAGNOSIS — F17210 Nicotine dependence, cigarettes, uncomplicated: Secondary | ICD-10-CM | POA: Insufficient documentation

## 2019-06-30 DIAGNOSIS — S39012A Strain of muscle, fascia and tendon of lower back, initial encounter: Secondary | ICD-10-CM | POA: Insufficient documentation

## 2019-06-30 DIAGNOSIS — Y999 Unspecified external cause status: Secondary | ICD-10-CM | POA: Insufficient documentation

## 2019-06-30 MED ORDER — OXYCODONE HCL 5 MG PO TABS: 5.0000 mg | ORAL_TABLET | ORAL | 0 refills | Status: DC | PRN

## 2019-06-30 MED ORDER — LORAZEPAM 2 MG/ML IJ SOLN
0.5000 mg | Freq: Once | INTRAMUSCULAR | Status: AC
  Administered 2019-06-30: 0.5 mg via INTRAMUSCULAR
  Filled 2019-06-30: qty 1

## 2019-06-30 MED ORDER — LORAZEPAM 0.5 MG PO TABS: 0.5000 mg | ORAL_TABLET | Freq: Once | ORAL | Status: DC
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
## 2019-06-30 NOTE — ED Notes (Signed)
Pt to ct 
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
## 2019-06-30 NOTE — Discharge Instructions (Addendum)
Follow-up with your regular physician for any increased pain in your lower back.  Return emergency department if worsening.  Take the oxycodone if your regular dose is not working.  You may also take over-the-counter medications such as ibuprofen.  Apply ice to any areas that hurt.

## 2019-07-01 ENCOUNTER — Telehealth: Payer: Self-pay | Admitting: Anesthesiology

## 2019-07-01 LAB — TOXASSURE SELECT 13 (MW), URINE

## 2019-07-01 NOTE — Telephone Encounter (Signed)
Patient was in [place] yesterday and had to go to ED. Ed has sent script for additional meds to [facility] in [place]. States she doesn't know what else she will have to do. Having a lot of pain at this time.

## 2019-07-01 NOTE — Telephone Encounter (Signed)
Attempted to return call, message left. 

## 2019-07-01 NOTE — Telephone Encounter (Signed)
Was given Oxycodone in ED. Insurance will not allow payment for this because Dr. Andree Elk also prescribes Oxycodone.

## 2019-07-05 ENCOUNTER — Telehealth: Payer: Self-pay

## 2019-07-06 ENCOUNTER — Other Ambulatory Visit: Payer: Self-pay

## 2019-07-06 ENCOUNTER — Encounter: Payer: Self-pay | Admitting: Anesthesiology

## 2019-07-06 ENCOUNTER — Ambulatory Visit: Payer: Medicaid Other | Attending: Anesthesiology | Admitting: Anesthesiology

## 2019-07-06 VITALS — BP 123/86 | HR 66 | Temp 97.6°F | Resp 18 | Ht 65.0 in | Wt 213.0 lb

## 2019-07-06 DIAGNOSIS — M545 Low back pain, unspecified: Secondary | ICD-10-CM | POA: Insufficient documentation

## 2019-07-06 DIAGNOSIS — M542 Cervicalgia: Secondary | ICD-10-CM | POA: Diagnosis present

## 2019-07-06 DIAGNOSIS — M5442 Lumbago with sciatica, left side: Secondary | ICD-10-CM | POA: Insufficient documentation

## 2019-07-06 DIAGNOSIS — M47816 Spondylosis without myelopathy or radiculopathy, lumbar region: Secondary | ICD-10-CM | POA: Diagnosis not present

## 2019-07-06 DIAGNOSIS — G894 Chronic pain syndrome: Secondary | ICD-10-CM | POA: Diagnosis present

## 2019-07-06 DIAGNOSIS — R29898 Other symptoms and signs involving the musculoskeletal system: Secondary | ICD-10-CM | POA: Diagnosis present

## 2019-07-06 DIAGNOSIS — M5030 Other cervical disc degeneration, unspecified cervical region: Secondary | ICD-10-CM | POA: Diagnosis present

## 2019-07-06 DIAGNOSIS — Z79891 Long term (current) use of opiate analgesic: Secondary | ICD-10-CM | POA: Diagnosis present

## 2019-07-06 DIAGNOSIS — M5431 Sciatica, right side: Secondary | ICD-10-CM

## 2019-07-06 DIAGNOSIS — G8929 Other chronic pain: Secondary | ICD-10-CM | POA: Diagnosis present

## 2019-07-06 DIAGNOSIS — M5441 Lumbago with sciatica, right side: Secondary | ICD-10-CM | POA: Insufficient documentation

## 2019-07-06 MED ORDER — ORPHENADRINE CITRATE 30 MG/ML IJ SOLN
INTRAMUSCULAR | Status: AC
  Filled 2019-07-06: qty 2

## 2019-07-06 MED ORDER — ROPIVACAINE HCL 2 MG/ML IJ SOLN
INTRAMUSCULAR | Status: AC
  Filled 2019-07-06: qty 10

## 2019-07-06 MED ORDER — DEXAMETHASONE SODIUM PHOSPHATE 10 MG/ML IJ SOLN
10.0000 mg | Freq: Once | INTRAMUSCULAR | Status: AC
  Administered 2019-07-06: 10 mg

## 2019-07-06 MED ORDER — ORPHENADRINE CITRATE 30 MG/ML IJ SOLN
60.0000 mg | Freq: Once | INTRAMUSCULAR | Status: AC
  Administered 2019-07-06: 60 mg via INTRAMUSCULAR

## 2019-07-06 MED ORDER — DEXAMETHASONE SODIUM PHOSPHATE 10 MG/ML IJ SOLN
INTRAMUSCULAR | Status: AC
  Filled 2019-07-06: qty 1

## 2019-07-06 MED ORDER — ROPIVACAINE HCL 2 MG/ML IJ SOLN
10.0000 mL | Freq: Once | INTRAMUSCULAR | Status: AC
  Administered 2019-07-06: 10 mL via EPIDURAL

## 2019-07-06 NOTE — Telephone Encounter (Signed)
Patient requested in clinic visit, scheduled for 07/06/19

## 2019-07-06 NOTE — Patient Instructions (Signed)
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
## 2019-07-06 NOTE — Progress Notes (Signed)
Safety precautions to be maintained throughout the outpatient stay will include: orient to surroundings, keep bed in low position, maintain call bell within reach at all times, provide assistance with transfer out of bed and ambulation.  

## 2019-07-08 ENCOUNTER — Telehealth: Payer: Self-pay | Admitting: Anesthesiology
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
## 2019-07-12 NOTE — Telephone Encounter (Signed)
TPI done in clinic last week.

## 2019-07-14 ENCOUNTER — Telehealth: Payer: Self-pay | Admitting: *Deleted

## 2019-07-14 NOTE — Telephone Encounter (Signed)
Dr. Andree Elk said please call patient and schedule TPI for next available time.

## 2019-07-14 NOTE — Telephone Encounter (Signed)
Patient called back, states the TPI helped for about 1 week. She wants to get another one. She has appt with PCP next week. I will ask Dr. Andree Elk.

## 2019-07-14 NOTE — Telephone Encounter (Signed)
Dr. Andree Elk notified. He instructed nurses to ask her if she received any relief after TPI. She may need to see PCP or ortho for further eval. Attempted to call patient, message left.

## 2019-07-16 ENCOUNTER — Telehealth: Payer: Self-pay | Admitting: Anesthesiology

## 2019-07-16 NOTE — Telephone Encounter (Signed)
[facility] PCP made medications changes and wanted you to be aware.  Patient was given IM toradol 60 mg injection and 5 days Toradol po, Meloxicam was held, Lyrica was temporarily increased to 225 mg bid, Robaxin was discontinued and placed on Baclofen.

## 2019-07-16 NOTE — Telephone Encounter (Signed)
Abel Presto, RN  from [facility] PCP lvmail stating she would like to speak with someone about medication changes they did for [facility] yesterday. Please call  [phone]

## 2019-08-02 ENCOUNTER — Other Ambulatory Visit: Payer: Self-pay

## 2019-08-02 ENCOUNTER — Encounter: Payer: Self-pay | Admitting: Emergency Medicine

## 2019-08-02 DIAGNOSIS — M48061 Spinal stenosis, lumbar region without neurogenic claudication: Secondary | ICD-10-CM | POA: Insufficient documentation

## 2019-08-02 DIAGNOSIS — M5136 Other intervertebral disc degeneration, lumbar region: Secondary | ICD-10-CM | POA: Diagnosis not present

## 2019-08-02 DIAGNOSIS — F17210 Nicotine dependence, cigarettes, uncomplicated: Secondary | ICD-10-CM | POA: Diagnosis not present

## 2019-08-02 DIAGNOSIS — M545 Low back pain: Secondary | ICD-10-CM | POA: Diagnosis present

## 2019-08-02 DIAGNOSIS — Z79899 Other long term (current) drug therapy: Secondary | ICD-10-CM | POA: Diagnosis not present
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
## 2019-08-03 ENCOUNTER — Emergency Department: Payer: Medicaid Other

## 2019-08-03 ENCOUNTER — Emergency Department
Admission: EM | Admit: 2019-08-03 | Discharge: 2019-08-03 | Disposition: A | Discharge status: Home / Self Care | Payer: Medicaid Other | Attending: Emergency Medicine | Admitting: Emergency Medicine

## 2019-08-03 DIAGNOSIS — M48061 Spinal stenosis, lumbar region without neurogenic claudication: Secondary | ICD-10-CM

## 2019-08-03 DIAGNOSIS — M5136 Other intervertebral disc degeneration, lumbar region: Secondary | ICD-10-CM

## 2019-08-03 MED ORDER — KETOROLAC TROMETHAMINE 30 MG/ML IJ SOLN
15.0000 mg | Freq: Once | INTRAMUSCULAR | Status: AC
  Administered 2019-08-03: 15 mg via INTRAVENOUS
  Filled 2019-08-03: qty 1

## 2019-08-03 MED ORDER — GADOBUTROL 1 MMOL/ML IV SOLN
9.0000 mL | Freq: Once | INTRAVENOUS | Status: AC | PRN
  Administered 2019-08-03: 9 mL via INTRAVENOUS

## 2019-08-03 MED ORDER — ONDANSETRON HCL 4 MG/2ML IJ SOLN
4.0000 mg | Freq: Once | INTRAMUSCULAR | Status: AC
  Administered 2019-08-03: 4 mg via INTRAVENOUS
  Filled 2019-08-03: qty 2

## 2019-08-03 MED ORDER — MORPHINE SULFATE (PF) 4 MG/ML IV SOLN
4.0000 mg | Freq: Once | INTRAVENOUS | Status: AC
  Administered 2019-08-03: 4 mg via INTRAVENOUS
  Filled 2019-08-03: qty 1

## 2019-08-03 NOTE — Discharge Instructions (Addendum)
You have been seen in the Emergency Department (ED)  today for back pain.  Back pain has many possible causes some are related to muscles while others have more serious causes. Even though you were checked carefully today and your exam and evaluation were reassuring, problems may develop later or continue to unfold. Therefore it is imperative that you follow up with doctor closely for further evaluation.  Follow-up with your doctor in 1 day for further evaluation.  For pain control take medications prescribed by your pain clinic.  When should you call for help?  Call your doctor now or seek immediate medical care if:  You have new or worsening numbness in your legs.  You have new or worsening weakness in your legs. (This could make it hard to stand up.)  You lose control of your bladder or bowels or if you are unable to urinate. You have numbness of your groin or buttock region If you develop a fever  Watch closely for changes in your health, and be sure to contact your doctor if:  Your pain gets worse.  You are not getting better after 2 weeks.  How can you care for yourself at home?  Take pain medicines exactly as directed.  If the doctor gave you a prescription medicine for pain, take it as prescribed.  If you are not taking a prescription pain medicine, ask your doctor if you can take an over-the-counter medicine like tylenol or ibuprofen. Sit or lie in positions that are most comfortable and reduce your pain. Try one of these positions when you lie down:  Lie on your back with your knees bent and supported by large pillows.  Lie on the floor with your legs on the seat of a sofa or chair.  Lie on your side with your knees and hips bent and a pillow between your legs.  Lie on your stomach if it does not make pain worse. Do not sit up in bed, and avoid soft couches and twisted positions. Bed rest can help relieve pain at first, but it delays healing. Avoid bed rest after the first day of  back pain.  Change positions every 30 minutes. If you must sit for long periods of time, take breaks from sitting. Get up and walk around, or lie in a comfortable position.  Try using a heating pad on a low or medium setting for 15 to 20 minutes every 2 or 3 hours. Try a warm shower in place of one session with the heating pad.  You can also try an ice pack for 10 to 15 minutes every 2 to 3 hours. Put a thin cloth between the ice pack and your skin.  Take short walks several times a day. You can start with 5 to 10 minutes, 3 or 4 times a day, and work up to longer walks. Walk on level surfaces and avoid hills and stairs until your back is better.  Return to work and other activities as soon as you can. Continued rest without activity is usually not good for your back.  To prevent future back pain, do exercises to stretch and strengthen your back and stomach. Learn how to use good posture, safe lifting techniques, and proper body mechanics.
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
## 2019-08-04 ENCOUNTER — Other Ambulatory Visit: Payer: Self-pay | Admitting: Nurse Practitioner

## 2019-08-04 ENCOUNTER — Other Ambulatory Visit (HOSPITAL_COMMUNITY): Payer: Self-pay | Admitting: Nurse Practitioner

## 2019-08-04 DIAGNOSIS — G8929 Other chronic pain: Secondary | ICD-10-CM

## 2019-08-04 DIAGNOSIS — M542 Cervicalgia: Secondary | ICD-10-CM

## 2019-08-17 ENCOUNTER — Ambulatory Visit: Payer: Medicaid Other

## 2019-08-23 ENCOUNTER — Other Ambulatory Visit: Payer: Self-pay

## 2019-08-23 ENCOUNTER — Ambulatory Visit
Admission: RE | Admit: 2019-08-23 | Discharge: 2019-08-23 | Disposition: A | Discharge status: Home / Self Care | Payer: Medicaid Other | Source: Ambulatory Visit | Attending: Anesthesiology | Admitting: Anesthesiology

## 2019-08-23 ENCOUNTER — Ambulatory Visit (HOSPITAL_BASED_OUTPATIENT_CLINIC_OR_DEPARTMENT_OTHER): Payer: Medicaid Other | Admitting: Anesthesiology

## 2019-08-23 ENCOUNTER — Other Ambulatory Visit: Payer: Self-pay | Admitting: Anesthesiology

## 2019-08-23 ENCOUNTER — Encounter: Payer: Self-pay | Admitting: Anesthesiology

## 2019-08-23 VITALS — BP 150/95 | HR 76 | Temp 98.2°F | Resp 16 | Ht 65.0 in | Wt 213.0 lb

## 2019-08-23 DIAGNOSIS — M5030 Other cervical disc degeneration, unspecified cervical region: Secondary | ICD-10-CM | POA: Insufficient documentation

## 2019-08-23 DIAGNOSIS — M5442 Lumbago with sciatica, left side: Secondary | ICD-10-CM | POA: Diagnosis present

## 2019-08-23 DIAGNOSIS — M47816 Spondylosis without myelopathy or radiculopathy, lumbar region: Secondary | ICD-10-CM | POA: Diagnosis not present

## 2019-08-23 DIAGNOSIS — M542 Cervicalgia: Secondary | ICD-10-CM | POA: Diagnosis present

## 2019-08-23 DIAGNOSIS — R29898 Other symptoms and signs involving the musculoskeletal system: Secondary | ICD-10-CM

## 2019-08-23 DIAGNOSIS — G8929 Other chronic pain: Secondary | ICD-10-CM | POA: Diagnosis present

## 2019-08-23 DIAGNOSIS — Z79891 Long term (current) use of opiate analgesic: Secondary | ICD-10-CM | POA: Diagnosis present

## 2019-08-23 DIAGNOSIS — M5441 Lumbago with sciatica, right side: Secondary | ICD-10-CM | POA: Insufficient documentation

## 2019-08-23 DIAGNOSIS — G894 Chronic pain syndrome: Secondary | ICD-10-CM | POA: Diagnosis present

## 2019-08-23 DIAGNOSIS — R52 Pain, unspecified: Secondary | ICD-10-CM | POA: Diagnosis present

## 2019-08-23 MED ORDER — OXYCODONE HCL 5 MG PO TABS: 5.0000 mg | ORAL_TABLET | Freq: Four times a day (QID) | ORAL | 0 refills | Status: DC | PRN

## 2019-08-23 MED ORDER — TRIAMCINOLONE ACETONIDE 40 MG/ML IJ SUSP
INTRAMUSCULAR | Status: AC
  Filled 2019-08-23: qty 1

## 2019-08-23 MED ORDER — SODIUM CHLORIDE (PF) 0.9 % IJ SOLN
INTRAMUSCULAR | Status: AC
  Filled 2019-08-23: qty 10

## 2019-08-23 MED ORDER — LIDOCAINE HCL (PF) 1 % IJ SOLN
INTRAMUSCULAR | Status: AC
  Filled 2019-08-23: qty 10

## 2019-08-23 MED ORDER — ROPIVACAINE HCL 2 MG/ML IJ SOLN
INTRAMUSCULAR | Status: AC
  Filled 2019-08-23: qty 10

## 2019-08-23 NOTE — Progress Notes (Signed)
Safety precautions to be maintained throughout the outpatient stay will include: orient to surroundings, keep bed in low position, maintain call bell within reach at all times, provide assistance with transfer out of bed and ambulation.  

## 2019-08-23 NOTE — Progress Notes (Signed)
Subjective:  Patient ID: Hannah Maynard, female    DOB: 1965/03/09  Age: 54 y.o. MRN: [id number]  CC: Back Pain (LOWER) and Neck Pain   Procedure: L5-S1 epidural steroid under fluoroscopic guidance with no sedation  HPI Hannah Maynard presents for reevaluation.  She reports continued low back pain with radiation to bilateral lower extremities worse affecting the left anterior thigh.  She still has some give way weakness worse with prolonged standing but no significant change from her previous evaluation.  On her last epidural injection she reports approximately 75% relief lasting 1 month.  She is also complaining of some left posterior lateral neck pain affecting the left trapezius muscle.  This is worse when she looks up for any prolonged period of time.  She denies any numbness and tingling affecting the left upper extremity or loss of strength.  The pain is worse with certain positions namely looking up or certain neck motions.  It is spasming in nature and comes and goes.  In regards to her lower extremity function it is at baseline.  No changes noted.  Her motor strength is at baseline as well with no change in bowel or bladder function.  She is taking her medications as prescribed and these continue to give her good relief.  She denies any diverting or illicit use.  She claims that she has discontinued any type of cannabinoids.  Outpatient Medications Prior to Visit  Medication Sig Dispense Refill  . fluticasone (FLONASE) 50 MCG/ACT nasal spray Place into the nose.    . ibuprofen (ADVIL,MOTRIN) 200 MG tablet Take 200 mg by mouth every 6 (six) hours as needed.    . montelukast (SINGULAIR) 10 MG tablet Take 10 mg by mouth at bedtime.    . Nerve Stimulator (ZEWA TENS/EMS COMBO UNIT) DEVI by Does not apply route.    Marland Kitchen PROAIR HFA 108 (90 Base) MCG/ACT inhaler Inhale 2 puffs into the lungs as needed.    . sertraline (ZOLOFT) 100 MG tablet Take 200 mg by mouth at bedtime.     Marland Kitchen oxyCODONE (OXY  IR/ROXICODONE) 5 MG immediate release tablet Take 1 tablet (5 mg total) by mouth every 4 (four) hours as needed for severe pain. Not controlled by your regular dose of oxycodone 15 tablet 0  . oxyCODONE (ROXICODONE) 5 MG immediate release tablet Take 1 tablet (5 mg total) by mouth every 6 (six) hours as needed for severe pain. 120 tablet 0  . pregabalin (LYRICA) 75 MG capsule Take 2 capsules (150 mg total) by mouth 2 (two) times daily. 120 capsule 3  . sertraline (ZOLOFT) 50 MG tablet Take 200 mg by mouth daily.  (Patient not taking: Reported on 08/23/2019)     Facility-Administered Medications Prior to Visit  Medication Dose Route Frequency Provider Last Rate Last Admin  . lidocaine (PF) (XYLOCAINE) 1 % injection 5 mL  5 mL Subcutaneous Once Molli Barrows, MD      . ropivacaine (PF) 2 mg/mL (0.2%) (NAROPIN) injection 10 mL  10 mL Epidural Once Molli Barrows, MD        Review of Systems CNS: No confusion or sedation Cardiac: No angina or palpitations GI: No abdominal pain or constipation Constitutional: No nausea vomiting fevers or chills  Objective:  BP (!) 145/95   Pulse 76   Temp 98.2 F (36.8 C)   Resp 16   Ht 5\' 5"  (1.651 m)   Wt 213 lb (96.6 kg)   LMP  (LMP Unknown)  BMI 35.45 kg/m    BP Readings from Last 3 Encounters:  08/23/19 (!) 145/95  08/03/19 (!) 147/88  07/06/19 123/86     Wt Readings from Last 3 Encounters:  08/23/19 213 lb (96.6 kg)  08/02/19 213 lb (96.6 kg)  07/06/19 213 lb (96.6 kg)     Physical Exam Pt is alert and oriented PERRL EOMI HEART IS RRR no murmur or rub LCTA no wheezing or rales MUSCULOSKELETAL reveals a trigger point in the left mid body trapezius.  This does reproduce her primary pain complaint.  Her upper extremity strength is good flexion extension at the elbows and wrist.  Hand grasp is intact.  There is no evidence of any deficit.  She is ambulating with a very antalgic gait using a crutch for assistance.  Her lower extremity  muscle tone and bulk is at baseline.  Labs  No results found for: HGBA1C Lab Results  Component Value Date   CREATININE 0.85 10/28/2017    -------------------------------------------------------------------------------------------------------------------- Lab Results  Component Value Date   GLUCOSE 108 (H) 10/28/2017   AST 49 (H) 10/28/2017   NA 142 10/28/2017   K 4.4 10/28/2017   CL 107 (H) 10/28/2017   CREATININE 0.85 10/28/2017   BUN 9 10/28/2017    --------------------------------------------------------------------------------------------------------------------- No results found.   Assessment & Plan:   Hannah Maynard was seen today for back pain and neck pain.  Diagnoses and all orders for this visit:  Lumbar spondylosis  DDD (degenerative disc disease), cervical -     Ambulatory referral to Neurosurgery  Chronic bilateral low back pain with bilateral sciatica -     Ambulatory referral to Neurosurgery  Cervicalgia  Chronic pain syndrome  Long term current use of opiate analgesic  Weakness of both lower extremities  Facet arthritis of lumbar region  Other orders -     oxyCODONE (OXY IR/ROXICODONE) 5 MG immediate release tablet; Take 1 tablet (5 mg total) by mouth every 6 (six) hours as needed for severe pain. Not controlled by your regular dose of oxycodone -     oxyCODONE (ROXICODONE) 5 MG immediate release tablet; Take 1 tablet (5 mg total) by mouth every 6 (six) hours as needed for severe pain.        ----------------------------------------------------------------------------------------------------------------------  Problem List Items Addressed This Visit      Unprioritized   Cervicalgia   Chronic low back pain (Chronic)   Relevant Medications   oxyCODONE (OXY IR/ROXICODONE) 5 MG immediate release tablet (Start on 09/09/2019)   oxyCODONE (ROXICODONE) 5 MG immediate release tablet (Start on 10/10/2019)   Other Relevant Orders   Ambulatory referral  to Neurosurgery   Chronic pain syndrome   Relevant Medications   oxyCODONE (OXY IR/ROXICODONE) 5 MG immediate release tablet (Start on 09/09/2019)   oxyCODONE (ROXICODONE) 5 MG immediate release tablet (Start on 10/10/2019)   DDD (degenerative disc disease), cervical   Relevant Medications   oxyCODONE (OXY IR/ROXICODONE) 5 MG immediate release tablet (Start on 09/09/2019)   oxyCODONE (ROXICODONE) 5 MG immediate release tablet (Start on 10/10/2019)   Other Relevant Orders   Ambulatory referral to Neurosurgery   Long term current use of opiate analgesic   Lumbar spondylosis - Primary   Relevant Medications   oxyCODONE (OXY IR/ROXICODONE) 5 MG immediate release tablet (Start on 09/09/2019)   oxyCODONE (ROXICODONE) 5 MG immediate release tablet (Start on 10/10/2019)    Other Visit Diagnoses    Weakness of both lower extremities       Facet arthritis of lumbar  region       Relevant Medications   oxyCODONE (OXY IR/ROXICODONE) 5 MG immediate release tablet (Start on 09/09/2019)   oxyCODONE (ROXICODONE) 5 MG immediate release tablet (Start on 10/10/2019)        ----------------------------------------------------------------------------------------------------------------------  1. Lumbar spondylosis Based on the chronicity of her complaints and per her request I am sending her for a neurosurgical consultation to determine if there is any type of decompressive surgery that would be of benefit for her lumbar region.  In the meantime want her to continue with back stretching strengthening exercises as reviewed and we will refill her medications.  She has been compliant and these will be dated for July 15 and August 14.  We will schedule her for return to clinic in 2 months.  2. DDD (degenerative disc disease), cervical We talked about some exercises for her neck and positional modification.  Also elimination of overhead activity and certain restrictions on flexion extension at the shoulder and left  elbow. - Ambulatory referral to Neurosurgery  3. Chronic bilateral low back pain with bilateral sciatica As above and we will proceed with an epidural steroid injection today.  We went over the risks and benefits of this with her in full detail and all questions are answered. - Ambulatory referral to Neurosurgery  4. Cervicalgia As above  5. Chronic pain syndrome I have reviewed the [facility] practitioner database information and it is appropriate for refills today.  6. Long term current use of opiate analgesic As above  7. Weakness of both lower extremities As above  8. Facet arthritis of lumbar region As above    ----------------------------------------------------------------------------------------------------------------------  I have changed Hannah Maynard's oxyCODONE. I am also having her maintain her ibuprofen, [place] TENS/EMS Combo Unit, ProAir HFA, montelukast, sertraline, pregabalin, fluticasone, sertraline, and oxyCODONE. We will continue to administer ropivacaine (PF) 2 mg/mL (0.2%) and lidocaine (PF).   Meds ordered this encounter  Medications  . oxyCODONE (OXY IR/ROXICODONE) 5 MG immediate release tablet    Sig: Take 1 tablet (5 mg total) by mouth every 6 (six) hours as needed for severe pain. Not controlled by your regular dose of oxycodone    Dispense:  120 tablet    Refill:  0  . oxyCODONE (ROXICODONE) 5 MG immediate release tablet    Sig: Take 1 tablet (5 mg total) by mouth every 6 (six) hours as needed for severe pain.    Dispense:  120 tablet    Refill:  0   Patient's Medications  New Prescriptions   No medications on file  Previous Medications   FLUTICASONE (FLONASE) 50 MCG/ACT NASAL SPRAY    Place into the nose.   IBUPROFEN (ADVIL,MOTRIN) 200 MG TABLET    Take 200 mg by mouth every 6 (six) hours as needed.   MONTELUKAST (SINGULAIR) 10 MG TABLET    Take 10 mg by mouth at bedtime.   NERVE STIMULATOR (ZEWA TENS/EMS COMBO UNIT) DEVI    by Does  not apply route.   PREGABALIN (LYRICA) 75 MG CAPSULE    Take 2 capsules (150 mg total) by mouth 2 (two) times daily.   PROAIR HFA 108 (90 BASE) MCG/ACT INHALER    Inhale 2 puffs into the lungs as needed.   SERTRALINE (ZOLOFT) 100 MG TABLET    Take 200 mg by mouth at bedtime.    SERTRALINE (ZOLOFT) 50 MG TABLET    Take 200 mg by mouth daily.   Modified Medications   Modified Medication Previous Medication  OXYCODONE (OXY IR/ROXICODONE) 5 MG IMMEDIATE RELEASE TABLET oxyCODONE (OXY IR/ROXICODONE) 5 MG immediate release tablet      Take 1 tablet (5 mg total) by mouth every 6 (six) hours as needed for severe pain. Not controlled by your regular dose of oxycodone    Take 1 tablet (5 mg total) by mouth every 4 (four) hours as needed for severe pain. Not controlled by your regular dose of oxycodone   OXYCODONE (ROXICODONE) 5 MG IMMEDIATE RELEASE TABLET oxyCODONE (ROXICODONE) 5 MG immediate release tablet      Take 1 tablet (5 mg total) by mouth every 6 (six) hours as needed for severe pain.    Take 1 tablet (5 mg total) by mouth every 6 (six) hours as needed for severe pain.  Discontinued Medications   No medications on file   ----------------------------------------------------------------------------------------------------------------------  Follow-up: Return in about 2 months (around 10/23/2019) for evaluation, med refill.   Procedure: L5-S1 LESI with fluoroscopic guidance and no moderate sedation  NOTE: The risks, benefits, and expectations of the procedure have been discussed and explained to the patient who was understanding and in agreement with suggested treatment plan. No guarantees were made.  DESCRIPTION OF PROCEDURE: Lumbar epidural steroid injection with no  IV Versed, EKG, blood pressure, pulse, and pulse oximetry monitoring. The procedure was performed with the patient in the prone position under fluoroscopic guidance.  Sterile prep x3 was initiated and I then injected subcutaneous  lidocaine to the overlying L5-S1 site after its fluoroscopic identifictation.  Using strict aseptic technique, I then advanced an 18-gauge Tuohy epidural needle in the midline using interlaminar approach via loss-of-resistance to saline technique. There was negative aspiration for heme or  CSF.  I then confirmed position with both AP and Lateral fluoroscan.  2 cc of contrast dye were injected and a  total of 5 mL of Preservative-Free normal saline mixed with 40 mg of Kenalog and 1cc Ropicaine 0.2 percent were injected incrementally via the  epidurally placed needle. The needle was removed. The patient tolerated the injection well and was convalesced and discharged to home in stable condition. Should the patient have any post procedure difficulty they have been instructed on how to contact us for assistance.    Molli Barrows, MD

## 2019-09-09 ENCOUNTER — Telehealth: Payer: Self-pay | Admitting: *Deleted

## 2019-09-09 ENCOUNTER — Telehealth: Payer: Self-pay | Admitting: Anesthesiology

## 2019-09-09 MED ORDER — OXYCODONE HCL 5 MG PO TABS: 5.0000 mg | ORAL_TABLET | Freq: Four times a day (QID) | ORAL | 0 refills | Status: AC | PRN

## 2019-09-09 NOTE — Telephone Encounter (Signed)
Patient called stating her usual pharmacy does not have her Oxycodone in stock. They told her the [facility] at [address] has Oxycodone. She needs script sent to new pharmacy for this time. I explained that Dr. Andree Elk was unavailable.

## 2019-09-09 NOTE — Telephone Encounter (Signed)
Dr Holley Raring, the pharmacy does not have the oxycodone at this time.  Dr Andree Elk is out of town.  Would you be willing to send to another pharmacy?  I will put the  [facility] on [address] in so that you can choose from it.

## 2019-10-11 ENCOUNTER — Telehealth: Payer: Self-pay | Admitting: *Deleted

## 2019-10-11 NOTE — Telephone Encounter (Signed)
PA sent via [employer].

## 2019-10-11 NOTE — Telephone Encounter (Signed)
Patient called back with [facility] Information  Medicaid [facility] ID #[id number]  Provider phone #[phone]

## 2019-10-13 ENCOUNTER — Telehealth: Payer: Self-pay | Admitting: Anesthesiology

## 2019-10-13 NOTE — Telephone Encounter (Signed)
Patient called her insurance about the PA for her medications. They state the fax did not come thru to them and gave her a fax number [phone] and said to mark URGENT.  Please check on PA. Thank you

## 2019-10-13 NOTE — Telephone Encounter (Signed)
Resent

## 2019-10-13 NOTE — Telephone Encounter (Signed)
Pt states that her insurance is saying they still have not received the PA for her pain medication.

## 2019-10-14 ENCOUNTER — Telehealth: Payer: Self-pay

## 2019-10-14 MED ORDER — OXYCODONE HCL 5 MG PO TABS: 5.0000 mg | ORAL_TABLET | Freq: Four times a day (QID) | ORAL | 0 refills | Status: AC | PRN

## 2019-10-14 NOTE — Telephone Encounter (Signed)
Patient called this am stating her meds need to be changed to tablets not capsules. Please check on this for patient and let her know, she still is not able to pick up her medications

## 2019-10-14 NOTE — Telephone Encounter (Signed)
Prior auth was sent for capsules instead of quick release tabs. She needs you to resubmit the pa for quick release tablets. 5mg 

## 2019-10-14 NOTE — Telephone Encounter (Signed)
Have spoken with patient, pharmacy and [facility] representative multiple times this morning attempting to get PA for oxycodone 5 mg.    Finally [facility] representative said that there would have to be an escalation process in order to get tablets approved, this will take 72 hours for turn around time.  They have approved that the patient can fill 5 days worth while waiting for PA.  Spoke back with pharmacy and this will require another Rx for qty 100 to fulfill the whole Rx.   I have called and explained this to the patient.  I will talk to Dr Andree Elk today and let him know about the situation and ask if he will write another Rx.

## 2019-10-14 NOTE — Telephone Encounter (Signed)
Called patient back to let her know that her oxycodone 5 mg has been approved.  She does state that [facility] has her medicine.  She had said that it needs to be tablet form and not capsules.  I let her know that the Rx was sent in for tablets.  She will call pharmacy to check on this.    Also, she states she needs a procedure at her next appt.  I will send message to scheduling to see if we can get her scheduled for this with Dr Andree Elk.

## 2019-10-14 NOTE — Addendum Note (Signed)
Addended by: Molli Barrows on: 10/14/2019 04:01 PM   Modules accepted: Orders

## 2019-10-19 ENCOUNTER — Telehealth: Payer: Self-pay | Admitting: *Deleted

## 2019-10-19 NOTE — Telephone Encounter (Signed)
Spoke with patient and to let her know that we have been working on [place].  Have resubmitted with additional information and we will check on this tomorrow. Patient verbalizes u/o information.

## 2019-10-19 NOTE — Telephone Encounter (Signed)
She just called again and said she cant get her pain medicine. She said the pharmacy is saying that they dont have prior authorization yet. She is in a lot of pain. She said if someone would call her please.

## 2019-10-21 ENCOUNTER — Encounter: Payer: Self-pay | Admitting: Anesthesiology

## 2019-10-21 ENCOUNTER — Ambulatory Visit (HOSPITAL_BASED_OUTPATIENT_CLINIC_OR_DEPARTMENT_OTHER): Payer: Medicaid Other | Admitting: Anesthesiology

## 2019-10-21 ENCOUNTER — Ambulatory Visit
Admission: RE | Admit: 2019-10-21 | Discharge: 2019-10-21 | Disposition: A | Discharge status: Home / Self Care | Payer: Medicaid Other | Source: Ambulatory Visit | Attending: Anesthesiology | Admitting: Anesthesiology

## 2019-10-21 ENCOUNTER — Other Ambulatory Visit: Payer: Self-pay | Admitting: Anesthesiology

## 2019-10-21 ENCOUNTER — Other Ambulatory Visit: Payer: Self-pay

## 2019-10-21 VITALS — BP 161/99 | HR 70 | Temp 98.5°F | Resp 21 | Ht 65.5 in | Wt 221.0 lb

## 2019-10-21 DIAGNOSIS — M542 Cervicalgia: Secondary | ICD-10-CM | POA: Diagnosis present

## 2019-10-21 DIAGNOSIS — G8929 Other chronic pain: Secondary | ICD-10-CM | POA: Diagnosis present

## 2019-10-21 DIAGNOSIS — R52 Pain, unspecified: Secondary | ICD-10-CM | POA: Insufficient documentation

## 2019-10-21 DIAGNOSIS — R29898 Other symptoms and signs involving the musculoskeletal system: Secondary | ICD-10-CM | POA: Insufficient documentation

## 2019-10-21 DIAGNOSIS — Z79891 Long term (current) use of opiate analgesic: Secondary | ICD-10-CM | POA: Diagnosis present

## 2019-10-21 DIAGNOSIS — M5442 Lumbago with sciatica, left side: Secondary | ICD-10-CM

## 2019-10-21 DIAGNOSIS — M5030 Other cervical disc degeneration, unspecified cervical region: Secondary | ICD-10-CM | POA: Insufficient documentation

## 2019-10-21 DIAGNOSIS — M5441 Lumbago with sciatica, right side: Secondary | ICD-10-CM | POA: Diagnosis present

## 2019-10-21 DIAGNOSIS — M5440 Lumbago with sciatica, unspecified side: Secondary | ICD-10-CM | POA: Diagnosis not present

## 2019-10-21 DIAGNOSIS — M5431 Sciatica, right side: Secondary | ICD-10-CM | POA: Diagnosis present

## 2019-10-21 DIAGNOSIS — G894 Chronic pain syndrome: Secondary | ICD-10-CM

## 2019-10-21 DIAGNOSIS — M47816 Spondylosis without myelopathy or radiculopathy, lumbar region: Secondary | ICD-10-CM | POA: Insufficient documentation

## 2019-10-21 MED ORDER — LIDOCAINE 5 % EX PTCH: 1.0000 | MEDICATED_PATCH | CUTANEOUS | 1 refills | Status: AC

## 2019-10-21 MED ORDER — OXYCODONE HCL 5 MG PO TABS: 5.0000 mg | ORAL_TABLET | Freq: Four times a day (QID) | ORAL | 0 refills | Status: DC | PRN

## 2019-10-21 MED ORDER — TRIAMCINOLONE ACETONIDE 40 MG/ML IJ SUSP
40.0000 mg | Freq: Once | INTRAMUSCULAR | Status: AC
  Administered 2019-10-21: 40 mg

## 2019-10-21 MED ORDER — ROPIVACAINE HCL 2 MG/ML IJ SOLN
10.0000 mL | Freq: Once | INTRAMUSCULAR | Status: AC
  Administered 2019-10-21: 10 mL via EPIDURAL

## 2019-10-21 MED ORDER — TRIAMCINOLONE ACETONIDE 40 MG/ML IJ SUSP
INTRAMUSCULAR | Status: AC
  Filled 2019-10-21: qty 1

## 2019-10-21 MED ORDER — LIDOCAINE HCL (PF) 1 % IJ SOLN
5.0000 mL | Freq: Once | INTRAMUSCULAR | Status: AC
  Administered 2019-10-21: 5 mL via SUBCUTANEOUS

## 2019-10-21 MED ORDER — IOHEXOL 180 MG/ML  SOLN
10.0000 mL | Freq: Once | INTRAMUSCULAR | Status: AC | PRN
  Administered 2019-10-21: 10 mL via EPIDURAL

## 2019-10-21 MED ORDER — ROPIVACAINE HCL 2 MG/ML IJ SOLN
INTRAMUSCULAR | Status: AC
  Filled 2019-10-21: qty 10

## 2019-10-21 MED ORDER — LIDOCAINE HCL 2 % IJ SOLN
INTRAMUSCULAR | Status: AC
  Filled 2019-10-21: qty 20

## 2019-10-21 MED ORDER — SODIUM CHLORIDE 0.9% FLUSH
10.0000 mL | Freq: Once | INTRAVENOUS | Status: AC
  Administered 2019-10-21: 10 mL

## 2019-10-21 MED ORDER — SODIUM CHLORIDE (PF) 0.9 % IJ SOLN
INTRAMUSCULAR | Status: AC
  Filled 2019-10-21: qty 10

## 2019-10-21 NOTE — Progress Notes (Signed)
Safety precautions to be maintained throughout the outpatient stay will include: orient to surroundings, keep bed in low position, maintain call bell within reach at all times, provide assistance with transfer out of bed and ambulation.  

## 2019-10-21 NOTE — Patient Instructions (Signed)

## 2019-10-21 NOTE — Progress Notes (Signed)
Subjective:  Patient ID: Hannah Maynard, female    DOB: Jan 30, 1966  Age: 54 y.o. MRN: [id number]  CC: Back Pain (low) and Leg Pain (bilateral)   Procedure: L5-S1 epidural steroid under fluoroscopic guidance with no sedation  HPI [name] presents for reevaluation.  She was last seen a few months ago and presents today for a repeat epidural.  She has these approximately every 3 months to help control her lower extremity sciatica right side greater than left and low back pain.  She has failed more conservative therapy and despite efforts at weight loss and physical therapy stretching strengthening exercises she continues to have severe unremitting pain with spasming affecting the right calf and occasional numbness and tingling affecting the base of the right foot.  She also describes persistent giveaway weakness affecting the right greater than left knee.  This is been chronic for her without recent change.  She has been seen by neurosurgery and found to be a nonsurgical candidate.  She also continues to have neck pain similar to what she is presented with previously of a spasming nature affecting the trapezius muscles bilaterally with no reported weakness affecting the upper extremities.  She takes her oxycodone as prescribed 5 mg 4 times a day.  She maintains that she is also been on CBD oil in the past.  This was noted on her recent UDS.  Outpatient Medications Prior to Visit  Medication Sig Dispense Refill   fluticasone (FLONASE) 50 MCG/ACT nasal spray Place into the nose.     ibuprofen (ADVIL,MOTRIN) 200 MG tablet Take 200 mg by mouth every 6 (six) hours as needed.     Nerve Stimulator (ZEWA TENS/EMS COMBO UNIT) DEVI by Does not apply route.     oxyCODONE (OXY IR/ROXICODONE) 5 MG immediate release tablet Take 1 tablet (5 mg total) by mouth every 6 (six) hours as needed for up to 25 days for severe pain. Not controlled by your regular dose of oxycodone 100 tablet 0   pregabalin  (LYRICA) 75 MG capsule Take 2 capsules (150 mg total) by mouth 2 (two) times daily. 120 capsule 3   PROAIR HFA 108 (90 Base) MCG/ACT inhaler Inhale 2 puffs into the lungs as needed.     sertraline (ZOLOFT) 100 MG tablet Take 200 mg by mouth at bedtime.      oxyCODONE (ROXICODONE) 5 MG immediate release tablet Take 1 tablet (5 mg total) by mouth every 6 (six) hours as needed for severe pain. 120 tablet 0   montelukast (SINGULAIR) 10 MG tablet Take 10 mg by mouth at bedtime.     sertraline (ZOLOFT) 50 MG tablet Take 200 mg by mouth daily.  (Patient not taking: Reported on 08/23/2019)     Facility-Administered Medications Prior to Visit  Medication Dose Route Frequency Provider Last Rate Last Admin   lidocaine (PF) (XYLOCAINE) 1 % injection 5 mL  5 mL Subcutaneous Once Molli Barrows, MD       ropivacaine (PF) 2 mg/mL (0.2%) (NAROPIN) injection 10 mL  10 mL Epidural Once Molli Barrows, MD        Review of Systems CNS: No confusion or sedation Cardiac: No angina or palpitations GI: No abdominal pain or constipation Constitutional: No nausea vomiting fevers or chills  Objective:  BP (!) 161/99    Pulse 70    Temp 98.5 F (36.9 C) (Oral)    Resp (!) 21    Ht 5' 5.5" (1.664 m)    Wt  221 lb (100.2 kg)    LMP  (LMP Unknown)    SpO2 100%    BMI 36.22 kg/m    BP Readings from Last 3 Encounters:  10/21/19 (!) 161/99  08/23/19 (!) 150/95  08/03/19 (!) 147/88     Wt Readings from Last 3 Encounters:  10/21/19 221 lb (100.2 kg)  08/23/19 213 lb (96.6 kg)  08/02/19 213 lb (96.6 kg)     Physical Exam Pt is alert and oriented PERRL EOMI HEART IS RRR no murmur or rub LCTA no wheezing or rales MUSCULOSKELETAL reveals some paraspinous muscle tenderness and spasming in the bilateral lumbar region.  No overt trigger points are noted.  She does have weakness with extension and flexion at the right knee rated at a 5-/5 she is intact with 5/5 strength to flexion extension at both ankles.   Sensation appears to be grossly intact and her muscle tone and bulk is at baseline as compared to previous examinations.  Labs  No results found for: HGBA1C Lab Results  Component Value Date   CREATININE 0.85 10/28/2017    -------------------------------------------------------------------------------------------------------------------- Lab Results  Component Value Date   GLUCOSE 108 (H) 10/28/2017   AST 49 (H) 10/28/2017   NA 142 10/28/2017   K 4.4 10/28/2017   CL 107 (H) 10/28/2017   CREATININE 0.85 10/28/2017   BUN 9 10/28/2017    --------------------------------------------------------------------------------------------------------------------- DG PAIN CLINIC C-ARM 1-60 MIN NO REPORT  Result Date: 10/21/2019 Fluoro was used, but no Radiologist interpretation will be provided. Please refer to "NOTES" tab for provider progress note.    Assessment & Plan:   [name] was seen today for back pain and leg pain.  Diagnoses and all orders for this visit:  Lumbar spondylosis  DDD (degenerative disc disease), cervical  Chronic bilateral low back pain with bilateral sciatica  Cervicalgia  Chronic pain syndrome -     ToxASSURE Select 13 (MW), Urine  Long term current use of opiate analgesic -     ToxASSURE Select 13 (MW), Urine  Weakness of both lower extremities  Facet arthritis of lumbar region  Right sided sciatica  Other orders -     Discontinue: oxyCODONE (ROXICODONE) 5 MG immediate release tablet; Take 1 tablet (5 mg total) by mouth every 6 (six) hours as needed for severe pain. -     lidocaine (LIDODERM) 5 %; Place 1 patch onto the skin daily. Remove & Discard patch within 12 hours or as directed by MD -     oxyCODONE (ROXICODONE) 5 MG immediate release tablet; Take 1 tablet (5 mg total) by mouth every 6 (six) hours as needed for severe pain. -     triamcinolone acetonide (KENALOG-40) injection 40 mg -     sodium chloride flush (NS) 0.9 % injection 10 mL -      ropivacaine (PF) 2 mg/mL (0.2%) (NAROPIN) injection 10 mL -     lidocaine (PF) (XYLOCAINE) 1 % injection 5 mL -     iohexol (OMNIPAQUE) 180 MG/ML injection 10 mL        ----------------------------------------------------------------------------------------------------------------------  Problem List Items Addressed This Visit      Unprioritized   Cervicalgia   Chronic low back pain (Chronic)   Relevant Medications   oxyCODONE (ROXICODONE) 5 MG immediate release tablet (Start on 11/18/2019)   Chronic pain syndrome   Relevant Medications   oxyCODONE (ROXICODONE) 5 MG immediate release tablet (Start on 11/18/2019)   lidocaine (PF) (XYLOCAINE) 1 % injection 5 mL (Start on 10/21/2019  1:45 PM)   Other Relevant Orders   ToxASSURE Select 13 (MW), Urine   DDD (degenerative disc disease), cervical   Relevant Medications   oxyCODONE (ROXICODONE) 5 MG immediate release tablet (Start on 11/18/2019)   Long term current use of opiate analgesic   Relevant Orders   ToxASSURE Select 13 (MW), Urine   Lumbar spondylosis - Primary   Relevant Medications   oxyCODONE (ROXICODONE) 5 MG immediate release tablet (Start on 11/18/2019)    Other Visit Diagnoses    Weakness of both lower extremities       Facet arthritis of lumbar region       Relevant Medications   oxyCODONE (ROXICODONE) 5 MG immediate release tablet (Start on 11/18/2019)   triamcinolone acetonide (KENALOG-40) injection 40 mg (Completed) (Start on 10/21/2019  1:45 PM)   Right sided sciatica            ----------------------------------------------------------------------------------------------------------------------  1. Lumbar spondylosis Continue core stretching strengthening as reviewed with her today.  2. DDD (degenerative disc disease), cervical As above  3. Chronic bilateral low back pain with bilateral sciatica We will proceed with a repeat epidural today.  The risks and benefits of been reviewed with her in full  detail and all questions answered.  I want her to continue with stretching exercises which I have shown her today as well as core strengthening.  We will start her on a Lidoderm patch to see if this can help with some of the spasming in the low back.  Furthermore we have counseled her once again regarding core strengthening.  4. Cervicalgia Continue stretching strengthening exercises as reviewed today  5. Chronic pain syndrome I have reviewed the [facility] practitioner database information and it is appropriate for refills dated for September 23.  We do need her to present for repeat urine drug screen secondary to her last review. - ToxASSURE Select 13 (MW), Urine  6. Long term current use of opiate analgesic As above - ToxASSURE Select 13 (MW), Urine  7. Weakness of both lower extremities Continue stretching strengthening exercises.  If this should change we will request a repeat neurosurgical evaluation.  She has been educated regarding this and to notify us accordingly.  8. Facet arthritis of lumbar region   9. Right sided sciatica As above    ----------------------------------------------------------------------------------------------------------------------  I am having Kimbely R. Roedl start on lidocaine and oxyCODONE. I am also having her maintain her ibuprofen, [place] TENS/EMS Combo Unit, ProAir HFA, montelukast, sertraline, pregabalin, fluticasone, sertraline, and oxyCODONE. We administered triamcinolone acetonide, sodium chloride flush, and ropivacaine (PF) 2 mg/mL (0.2%). We will continue to administer ropivacaine (PF) 2 mg/mL (0.2%) and lidocaine (PF).   Meds ordered this encounter  Medications   DISCONTD: oxyCODONE (ROXICODONE) 5 MG immediate release tablet    Sig: Take 1 tablet (5 mg total) by mouth every 6 (six) hours as needed for severe pain.    Dispense:  120 tablet    Refill:  0   lidocaine (LIDODERM) 5 %    Sig: Place 1 patch onto the skin daily. Remove  & Discard patch within 12 hours or as directed by MD    Dispense:  30 patch    Refill:  1   oxyCODONE (ROXICODONE) 5 MG immediate release tablet    Sig: Take 1 tablet (5 mg total) by mouth every 6 (six) hours as needed for severe pain.    Dispense:  150 tablet    Refill:  0   triamcinolone acetonide (KENALOG-40) injection  40 mg   sodium chloride flush (NS) 0.9 % injection 10 mL   ropivacaine (PF) 2 mg/mL (0.2%) (NAROPIN) injection 10 mL   lidocaine (PF) (XYLOCAINE) 1 % injection 5 mL   iohexol (OMNIPAQUE) 180 MG/ML injection 10 mL   Patient's Medications  New Prescriptions   LIDOCAINE (LIDODERM) 5 %    Place 1 patch onto the skin daily. Remove & Discard patch within 12 hours or as directed by MD   OXYCODONE (ROXICODONE) 5 MG IMMEDIATE RELEASE TABLET    Take 1 tablet (5 mg total) by mouth every 6 (six) hours as needed for severe pain.  Previous Medications   FLUTICASONE (FLONASE) 50 MCG/ACT NASAL SPRAY    Place into the nose.   IBUPROFEN (ADVIL,MOTRIN) 200 MG TABLET    Take 200 mg by mouth every 6 (six) hours as needed.   MONTELUKAST (SINGULAIR) 10 MG TABLET    Take 10 mg by mouth at bedtime.   NERVE STIMULATOR (ZEWA TENS/EMS COMBO UNIT) DEVI    by Does not apply route.   OXYCODONE (OXY IR/ROXICODONE) 5 MG IMMEDIATE RELEASE TABLET    Take 1 tablet (5 mg total) by mouth every 6 (six) hours as needed for up to 25 days for severe pain. Not controlled by your regular dose of oxycodone   PREGABALIN (LYRICA) 75 MG CAPSULE    Take 2 capsules (150 mg total) by mouth 2 (two) times daily.   PROAIR HFA 108 (90 BASE) MCG/ACT INHALER    Inhale 2 puffs into the lungs as needed.   SERTRALINE (ZOLOFT) 100 MG TABLET    Take 200 mg by mouth at bedtime.    SERTRALINE (ZOLOFT) 50 MG TABLET    Take 200 mg by mouth daily.   Modified Medications   No medications on file  Discontinued Medications   OXYCODONE (ROXICODONE) 5 MG IMMEDIATE RELEASE TABLET    Take 1 tablet (5 mg total) by mouth every 6 (six)  hours as needed for severe pain.   ----------------------------------------------------------------------------------------------------------------------  Follow-up: Return in about 2 months (around 12/21/2019) for evaluation, med refill.   Procedure: L5-S1 LESI with fluoroscopic guidance and no moderate sedation  NOTE: The risks, benefits, and expectations of the procedure have been discussed and explained to the patient who was understanding and in agreement with suggested treatment plan. No guarantees were made.  DESCRIPTION OF PROCEDURE: Lumbar epidural steroid injection with no IV Versed, EKG, blood pressure, pulse, and pulse oximetry monitoring. The procedure was performed with the patient in the prone position under fluoroscopic guidance.  Sterile prep x3 was initiated and I then injected subcutaneous lidocaine to the overlying L5-S1 site after its fluoroscopic identifictation.  Using strict aseptic technique, I then advanced an 18-gauge Tuohy epidural needle in the midline using interlaminar approach via loss-of-resistance to saline technique. There was negative aspiration for heme or  CSF.  I then confirmed position with both AP and Lateral fluoroscan.  2 cc of contrast dye were injected and a  total of 5 mL of Preservative-Free normal saline mixed with 40 mg of Kenalog and 1cc Ropicaine 0.2 percent were injected incrementally via the  epidurally placed needle. The needle was removed. The patient tolerated the injection well and was convalesced and discharged to home in stable condition. Should the patient have any post procedure difficulty they have been instructed on how to contact us for assistance.    Molli Barrows, MD

## 2019-10-22 ENCOUNTER — Telehealth: Payer: Self-pay

## 2019-10-22 NOTE — Telephone Encounter (Signed)
Post procedure phone call.  LM 

## 2019-12-16 ENCOUNTER — Ambulatory Visit: Payer: Medicaid Other | Attending: Anesthesiology | Admitting: Anesthesiology

## 2019-12-16 ENCOUNTER — Encounter: Payer: Self-pay | Admitting: Anesthesiology

## 2019-12-16 ENCOUNTER — Other Ambulatory Visit: Payer: Self-pay

## 2019-12-16 DIAGNOSIS — M5441 Lumbago with sciatica, right side: Secondary | ICD-10-CM

## 2019-12-16 DIAGNOSIS — G8929 Other chronic pain: Secondary | ICD-10-CM

## 2019-12-16 DIAGNOSIS — M5030 Other cervical disc degeneration, unspecified cervical region: Secondary | ICD-10-CM | POA: Diagnosis not present

## 2019-12-16 DIAGNOSIS — M47816 Spondylosis without myelopathy or radiculopathy, lumbar region: Secondary | ICD-10-CM

## 2019-12-16 DIAGNOSIS — G894 Chronic pain syndrome: Secondary | ICD-10-CM

## 2019-12-16 DIAGNOSIS — M5431 Sciatica, right side: Secondary | ICD-10-CM

## 2019-12-16 DIAGNOSIS — M48062 Spinal stenosis, lumbar region with neurogenic claudication: Secondary | ICD-10-CM

## 2019-12-16 DIAGNOSIS — Z79891 Long term (current) use of opiate analgesic: Secondary | ICD-10-CM

## 2019-12-16 DIAGNOSIS — M542 Cervicalgia: Secondary | ICD-10-CM

## 2019-12-16 DIAGNOSIS — R29898 Other symptoms and signs involving the musculoskeletal system: Secondary | ICD-10-CM

## 2019-12-16 DIAGNOSIS — M5442 Lumbago with sciatica, left side: Secondary | ICD-10-CM | POA: Diagnosis not present

## 2019-12-16 MED ORDER — OXYCODONE HCL 5 MG PO TABS
5.0000 mg | ORAL_TABLET | Freq: Four times a day (QID) | ORAL | 0 refills | Status: DC | PRN
Start: 2019-12-18 — End: 2020-01-13

## 2019-12-16 MED ORDER — PREGABALIN 75 MG PO CAPS: 150.0000 mg | ORAL_CAPSULE | Freq: Two times a day (BID) | ORAL | 3 refills | Status: DC

## 2019-12-16 MED ORDER — BACLOFEN 10 MG PO TABS
10.0000 mg | ORAL_TABLET | Freq: Three times a day (TID) | ORAL | 3 refills | Status: AC
Start: 2019-12-16 — End: 2020-01-15

## 2019-12-16 NOTE — Progress Notes (Signed)
Virtual Visit via Telephone Note  I connected with Hannah Maynard on 12/16/19 at 12:30 PM EDT by telephone and verified that I am speaking with the correct person using two identifiers.  Location: Patient: Home Provider: [facility] control center   I discussed the limitations, risks, security and privacy concerns of performing an evaluation and management service by telephone and the availability of in person appointments. I also discussed with the patient that there may be a patient responsible charge related to this service. The patient expressed understanding and agreed to proceed.   History of Present Illness: I spoke with Hannah Maynard today via telephone as she was unable to do the video portion of the virtual coverage. She reports that she did very well with her most recent epidural 2 months ago and feels like she is due for another one soon. She had approximately 75 to 80% improvement in her lower back pain and radiating leg pain but this has come back. It is similar to what she experienced before. She still having some intermittent weakness in the legs with prolonged standing or walking. No other changes are reported. She is taking her oxycodone and continue to get good relief with this taking 2 tablets twice a day and 1 tablet at nighttime. No side effects are reported. She continues to derive good functional benefit with the medications. Furthermore she reports that her family practice doctor has placed her on baclofen and this is helping with the muscle spasms. She has stopped her other muscle relaxants. No other changes are reported today on evaluation.  Current Outpatient Medications:  .  baclofen (LIORESAL) 10 MG tablet, Take 1 tablet (10 mg total) by mouth 3 (three) times daily., Disp: 90 tablet, Rfl: 3 .  fluticasone (FLONASE) 50 MCG/ACT nasal spray, Place into the nose., Disp: , Rfl:  .  ibuprofen (ADVIL,MOTRIN) 200 MG tablet, Take 200 mg by mouth every 6 (six) hours as needed., Disp: ,  Rfl:  .  montelukast (SINGULAIR) 10 MG tablet, Take 10 mg by mouth at bedtime., Disp: , Rfl:  .  Nerve Stimulator (ZEWA TENS/EMS COMBO UNIT) DEVI, by Does not apply route., Disp: , Rfl:  .  [START ON 12/18/2019] oxyCODONE (ROXICODONE) 5 MG immediate release tablet, Take 1 tablet (5 mg total) by mouth every 6 (six) hours as needed for severe pain., Disp: 150 tablet, Rfl: 0 .  pregabalin (LYRICA) 75 MG capsule, Take 2 capsules (150 mg total) by mouth 2 (two) times daily., Disp: 120 capsule, Rfl: 3 .  PROAIR HFA 108 (90 Base) MCG/ACT inhaler, Inhale 2 puffs into the lungs as needed., Disp: , Rfl:  .  sertraline (ZOLOFT) 100 MG tablet, Take 200 mg by mouth at bedtime. , Disp: , Rfl:  .  sertraline (ZOLOFT) 50 MG tablet, Take 200 mg by mouth daily.  (Patient not taking: Reported on 08/23/2019), Disp: , Rfl:   Current Facility-Administered Medications:  .  lidocaine (PF) (XYLOCAINE) 1 % injection 5 mL, 5 mL, Subcutaneous, Once, Molli Barrows, MD .  ropivacaine (PF) 2 mg/mL (0.2%) (NAROPIN) injection 10 mL, 10 mL, Epidural, Once, Andree Elk Alvina Filbert, MD  Assessment and Plan: 1. Lumbar spondylosis   2. DDD (degenerative disc disease), cervical   3. Chronic bilateral low back pain with bilateral sciatica   4. Cervicalgia   5. Chronic pain syndrome   6. Long term current use of opiate analgesic   7. Weakness of both lower extremities   8. Right sided sciatica   9.  Spinal stenosis of lumbar region with neurogenic claudication   Based on our discussion today upon review of the [facility] practitioner database information going to refill her oxycodone for the next month. She continues to get good relief with the medications and has been compliant with no evidence of any diverting or illicit use. However she did have a recent urine drug screen that showed positive for THC. A new repeat urine screen is requested. She reports no further use of THC. Lastly secondary to the recurrent and protracted low back pain  I am going to send her to see Dr. Cari Caraway for evaluation to determine whether she may be a surgical candidate for possible decompression secondary to her most recent MRI and spinal stenosis issues. And we will refill her baclofen today and gabapentin as well. She is scheduled for an epidural in approximately 2 to 3 weeks.  Follow Up Instructions:    I discussed the assessment and treatment plan with the patient. The patient was provided an opportunity to ask questions and all were answered. The patient agreed with the plan and demonstrated an understanding of the instructions.   The patient was advised to call back or seek an in-person evaluation if the symptoms worsen or if the condition fails to improve as anticipated.  I provided 30 minutes of non-face-to-face time during this encounter.   Molli Barrows, MD

## 2020-01-13 ENCOUNTER — Encounter: Payer: Self-pay | Admitting: Anesthesiology

## 2020-01-13 ENCOUNTER — Ambulatory Visit (HOSPITAL_BASED_OUTPATIENT_CLINIC_OR_DEPARTMENT_OTHER): Payer: Medicaid Other | Admitting: Anesthesiology

## 2020-01-13 ENCOUNTER — Ambulatory Visit
Admission: RE | Admit: 2020-01-13 | Discharge: 2020-01-13 | Disposition: A | Discharge status: Home / Self Care | Payer: Medicaid Other | Source: Ambulatory Visit | Attending: Anesthesiology | Admitting: Anesthesiology

## 2020-01-13 ENCOUNTER — Other Ambulatory Visit: Payer: Self-pay

## 2020-01-13 ENCOUNTER — Other Ambulatory Visit: Payer: Self-pay | Admitting: Anesthesiology

## 2020-01-13 VITALS — BP 161/95 | HR 71 | Temp 97.4°F | Resp 16 | Ht 65.0 in | Wt 220.0 lb

## 2020-01-13 DIAGNOSIS — G8929 Other chronic pain: Secondary | ICD-10-CM | POA: Insufficient documentation

## 2020-01-13 DIAGNOSIS — G894 Chronic pain syndrome: Secondary | ICD-10-CM | POA: Diagnosis present

## 2020-01-13 DIAGNOSIS — M5030 Other cervical disc degeneration, unspecified cervical region: Secondary | ICD-10-CM | POA: Insufficient documentation

## 2020-01-13 DIAGNOSIS — R52 Pain, unspecified: Secondary | ICD-10-CM | POA: Diagnosis present

## 2020-01-13 DIAGNOSIS — M47816 Spondylosis without myelopathy or radiculopathy, lumbar region: Secondary | ICD-10-CM | POA: Diagnosis present

## 2020-01-13 DIAGNOSIS — R29898 Other symptoms and signs involving the musculoskeletal system: Secondary | ICD-10-CM

## 2020-01-13 DIAGNOSIS — M542 Cervicalgia: Secondary | ICD-10-CM | POA: Diagnosis present

## 2020-01-13 DIAGNOSIS — M5442 Lumbago with sciatica, left side: Secondary | ICD-10-CM

## 2020-01-13 DIAGNOSIS — M5441 Lumbago with sciatica, right side: Secondary | ICD-10-CM

## 2020-01-13 DIAGNOSIS — Z79891 Long term (current) use of opiate analgesic: Secondary | ICD-10-CM | POA: Diagnosis present

## 2020-01-13 MED ORDER — OXYCODONE HCL 5 MG PO TABS: 5.0000 mg | ORAL_TABLET | ORAL | 0 refills | Status: DC | PRN

## 2020-01-13 MED ORDER — TRIAMCINOLONE ACETONIDE 40 MG/ML IJ SUSP
INTRAMUSCULAR | Status: AC
  Filled 2020-01-13: qty 1

## 2020-01-13 MED ORDER — SODIUM CHLORIDE 0.9% FLUSH
10.0000 mL | Freq: Once | INTRAVENOUS | Status: AC
  Administered 2020-01-13: 10 mL

## 2020-01-13 MED ORDER — ROPIVACAINE HCL 2 MG/ML IJ SOLN
10.0000 mL | Freq: Once | INTRAMUSCULAR | Status: AC
  Administered 2020-01-13: 10 mL via EPIDURAL

## 2020-01-13 MED ORDER — ROPIVACAINE HCL 2 MG/ML IJ SOLN
INTRAMUSCULAR | Status: AC
  Filled 2020-01-13: qty 10

## 2020-01-13 MED ORDER — LIDOCAINE HCL (PF) 1 % IJ SOLN
5.0000 mL | Freq: Once | INTRAMUSCULAR | Status: AC
  Administered 2020-01-13: 5 mL via SUBCUTANEOUS

## 2020-01-13 MED ORDER — LIDOCAINE HCL (PF) 1 % IJ SOLN
INTRAMUSCULAR | Status: AC
  Filled 2020-01-13: qty 10

## 2020-01-13 MED ORDER — TRIAMCINOLONE ACETONIDE 40 MG/ML IJ SUSP
40.0000 mg | Freq: Once | INTRAMUSCULAR | Status: AC
  Administered 2020-01-13: 40 mg

## 2020-01-13 MED ORDER — IOHEXOL 180 MG/ML  SOLN
10.0000 mL | Freq: Once | INTRAMUSCULAR | Status: AC | PRN
  Administered 2020-01-13: 10 mL via EPIDURAL

## 2020-01-13 MED ORDER — SODIUM CHLORIDE (PF) 0.9 % IJ SOLN
INTRAMUSCULAR | Status: AC
  Filled 2020-01-13: qty 10

## 2020-01-13 NOTE — Progress Notes (Signed)
Subjective:  Patient ID: Hannah Maynard, female    DOB: 30-Oct-1965  Age: 54 y.o. MRN: [id number]  CC: Back Pain   Procedure: L5-S1 epidural steroid under fluoroscopic guidance with no sedation  HPI Hannah Maynard presents for reevaluation.  She continues to complain of bilateral posterior lateral calf and foot sciatica symptoms with chronic low back pain.  The quality characteristic and distribution of this has been stable in nature.  She receives periodic epidurals for maintenance therapy.  She is try to be active with her physical therapy exercises and is contemplating aqua therapy as well.  Despite her best efforts she continues to have severe pain.  She is considered to be a nonsurgical candidate at this point.  She continues to take her medications and opiate therapy as prescribed and this is working well for her.  She averages approximately 4 tablets/day and gets good relief with her medications without side effect but unfortunately has failed more conservative therapy.  Otherwise no change in lower extremity strength or function or bowel or bladder function is noted at this time.  She desires to proceed with a repeat epidural today.  Outpatient Medications Prior to Visit  Medication Sig Dispense Refill  . baclofen (LIORESAL) 10 MG tablet Take 1 tablet (10 mg total) by mouth 3 (three) times daily. 90 tablet 3  . fluticasone (FLONASE) 50 MCG/ACT nasal spray Place into the nose.    . ibuprofen (ADVIL,MOTRIN) 200 MG tablet Take 200 mg by mouth every 6 (six) hours as needed.    . Nerve Stimulator (ZEWA TENS/EMS COMBO UNIT) DEVI by Does not apply route.    . pregabalin (LYRICA) 75 MG capsule Take 2 capsules (150 mg total) by mouth 2 (two) times daily. 120 capsule 3  . PROAIR HFA 108 (90 Base) MCG/ACT inhaler Inhale 2 puffs into the lungs as needed.    . sertraline (ZOLOFT) 100 MG tablet Take 200 mg by mouth at bedtime.     Marland Kitchen oxyCODONE (ROXICODONE) 5 MG immediate release tablet Take 1  tablet (5 mg total) by mouth every 6 (six) hours as needed for severe pain. 150 tablet 0  . montelukast (SINGULAIR) 10 MG tablet Take 10 mg by mouth at bedtime.    . sertraline (ZOLOFT) 50 MG tablet Take 200 mg by mouth daily.  (Patient not taking: Reported on 08/23/2019)     Facility-Administered Medications Prior to Visit  Medication Dose Route Frequency Provider Last Rate Last Admin  . lidocaine (PF) (XYLOCAINE) 1 % injection 5 mL  5 mL Subcutaneous Once Molli Barrows, MD      . ropivacaine (PF) 2 mg/mL (0.2%) (NAROPIN) injection 10 mL  10 mL Epidural Once Molli Barrows, MD        Review of Systems CNS: No confusion or sedation Cardiac: No angina or palpitations GI: No abdominal pain or constipation Constitutional: No nausea vomiting fevers or chills  Objective:  BP (!) 161/95   Pulse 71   Temp (!) 97.4 F (36.3 C) (Temporal)   Resp 16   Ht 5\' 5"  (1.651 m)   Wt 220 lb (99.8 kg)   LMP  (LMP Unknown)   SpO2 100%   BMI 36.61 kg/m    BP Readings from Last 3 Encounters:  01/13/20 (!) 161/95  10/21/19 (!) 161/99  08/23/19 (!) 150/95     Wt Readings from Last 3 Encounters:  01/13/20 220 lb (99.8 kg)  10/21/19 221 lb (100.2 kg)  08/23/19 213 lb (96.6  kg)     Physical Exam Pt is alert and oriented PERRL EOMI HEART IS RRR no murmur or rub LCTA no wheezing or rales MUSCULOSKELETAL reveals some paraspinous muscle tenderness but no overt trigger points.  Her muscle tone and bulk is good she does have a positive straight leg raise on the right side negative on the left.  She walks with an antalgic gait.  Otherwise her exam is stable  Labs  No results found for: HGBA1C Lab Results  Component Value Date   CREATININE 0.85 10/28/2017    -------------------------------------------------------------------------------------------------------------------- Lab Results  Component Value Date   GLUCOSE 108 (H) 10/28/2017   AST 49 (H) 10/28/2017   NA 142 10/28/2017   K 4.4  10/28/2017   CL 107 (H) 10/28/2017   CREATININE 0.85 10/28/2017   BUN 9 10/28/2017    --------------------------------------------------------------------------------------------------------------------- DG PAIN CLINIC C-ARM 1-60 MIN NO REPORT  Result Date: 01/13/2020 Fluoro was used, but no Radiologist interpretation will be provided. Please refer to "NOTES" tab for provider progress note.    Assessment & Plan:   Hannah Maynard was seen today for back pain.  Diagnoses and all orders for this visit:  Lumbar spondylosis  DDD (degenerative disc disease), cervical  Chronic bilateral low back pain with bilateral sciatica  Cervicalgia  Chronic pain syndrome -     ToxASSURE Select 13 (MW), Urine  Long term current use of opiate analgesic -     ToxASSURE Select 13 (MW), Urine  Weakness of both lower extremities  Other orders -     oxyCODONE (ROXICODONE) 5 MG immediate release tablet; Take 1 tablet (5 mg total) by mouth every 4 (four) hours as needed for severe pain. -     triamcinolone acetonide (KENALOG-40) injection 40 mg -     sodium chloride flush (NS) 0.9 % injection 10 mL -     ropivacaine (PF) 2 mg/mL (0.2%) (NAROPIN) injection 10 mL -     lidocaine (PF) (XYLOCAINE) 1 % injection 5 mL -     iohexol (OMNIPAQUE) 180 MG/ML injection 10 mL        ----------------------------------------------------------------------------------------------------------------------  Problem List Items Addressed This Visit      Unprioritized   Bilateral low back pain with bilateral sciatica   Relevant Medications   oxyCODONE (ROXICODONE) 5 MG immediate release tablet (Start on 01/17/2020)   Cervicalgia   Chronic pain syndrome   Relevant Medications   oxyCODONE (ROXICODONE) 5 MG immediate release tablet (Start on 01/17/2020)   Other Relevant Orders   ToxASSURE Select 13 (MW), Urine   DDD (degenerative disc disease), cervical   Relevant Medications   oxyCODONE (ROXICODONE) 5 MG  immediate release tablet (Start on 01/17/2020)   Long term current use of opiate analgesic   Relevant Orders   ToxASSURE Select 13 (MW), Urine   Lumbar spondylosis - Primary   Relevant Medications   oxyCODONE (ROXICODONE) 5 MG immediate release tablet (Start on 01/17/2020)    Other Visit Diagnoses    Weakness of both lower extremities            ----------------------------------------------------------------------------------------------------------------------  1. Lumbar spondylosis Continue efforts at weight loss stretching strengthening and physical therapy.  I think aqua therapy would be fantastic with continued efforts at weight loss as discussed with her in detail today.  2. DDD (degenerative disc disease), cervical Continue upper extremity core stretching strengthening exercises.  At this point she states that her upper extremity strength is preserved without recent change.  3. Chronic bilateral low back pain  with bilateral sciatica We will proceed with a repeat epidural today.  The risks and benefits of been reviewed with her in full detail and all questions were answered.  She has been evaluated recently by the neurosurgical team and at this point is considered a nonsurgical candidate.  They may review this if her pain persist despite epidural steroid administration and if she can lose some weight additionally.  4. Cervicalgia As above  5. Chronic pain syndrome She continues to do well on opioid therapy.  I have reviewed the [facility] practitioner database information and it is appropriate for refills.  A urine drug screen repeat is pending.- ToxASSURE Select 13 (MW), Urine  6. Long term current use of opiate analgesic As above - ToxASSURE Select 13 (MW), Urine  7. Weakness of both lower extremities Continue core stretching  strengthening.    ----------------------------------------------------------------------------------------------------------------------  I have changed Hannah Maynard's oxyCODONE. I am also having her maintain her ibuprofen, Zewa TENS/EMS Combo Unit, ProAir HFA, montelukast, sertraline, fluticasone, sertraline, pregabalin, and baclofen. We administered triamcinolone acetonide, sodium chloride flush, ropivacaine (PF) 2 mg/mL (0.2%), lidocaine (PF), and iohexol. We will continue to administer ropivacaine (PF) 2 mg/mL (0.2%) and lidocaine (PF).   Meds ordered this encounter  Medications  . oxyCODONE (ROXICODONE) 5 MG immediate release tablet    Sig: Take 1 tablet (5 mg total) by mouth every 4 (four) hours as needed for severe pain.    Dispense:  150 tablet    Refill:  0  . triamcinolone acetonide (KENALOG-40) injection 40 mg  . sodium chloride flush (NS) 0.9 % injection 10 mL  . ropivacaine (PF) 2 mg/mL (0.2%) (NAROPIN) injection 10 mL  . lidocaine (PF) (XYLOCAINE) 1 % injection 5 mL  . iohexol (OMNIPAQUE) 180 MG/ML injection 10 mL   Patient's Medications  New Prescriptions   No medications on file  Previous Medications   BACLOFEN (LIORESAL) 10 MG TABLET    Take 1 tablet (10 mg total) by mouth 3 (three) times daily.   FLUTICASONE (FLONASE) 50 MCG/ACT NASAL SPRAY    Place into the nose.   IBUPROFEN (ADVIL,MOTRIN) 200 MG TABLET    Take 200 mg by mouth every 6 (six) hours as needed.   MONTELUKAST (SINGULAIR) 10 MG TABLET    Take 10 mg by mouth at bedtime.   NERVE STIMULATOR (ZEWA TENS/EMS COMBO UNIT) DEVI    by Does not apply route.   PREGABALIN (LYRICA) 75 MG CAPSULE    Take 2 capsules (150 mg total) by mouth 2 (two) times daily.   PROAIR HFA 108 (90 BASE) MCG/ACT INHALER    Inhale 2 puffs into the lungs as needed.   SERTRALINE (ZOLOFT) 100 MG TABLET    Take 200 mg by mouth at bedtime.    SERTRALINE (ZOLOFT) 50 MG TABLET    Take 200 mg by mouth daily.   Modified Medications    Modified Medication Previous Medication   OXYCODONE (ROXICODONE) 5 MG IMMEDIATE RELEASE TABLET oxyCODONE (ROXICODONE) 5 MG immediate release tablet      Take 1 tablet (5 mg total) by mouth every 4 (four) hours as needed for severe pain.    Take 1 tablet (5 mg total) by mouth every 6 (six) hours as needed for severe pain.  Discontinued Medications   No medications on file   ----------------------------------------------------------------------------------------------------------------------  Follow-up: Return in about 1 month (around 02/12/2020) for evaluation, procedure.   Procedure: L5-S1 LESI with fluoroscopic guidance and no moderate sedation  NOTE: The risks,  benefits, and expectations of the procedure have been discussed and explained to the patient who was understanding and in agreement with suggested treatment plan. No guarantees were made.  DESCRIPTION OF PROCEDURE: Lumbar epidural steroid injection with no IV Versed, EKG, blood pressure, pulse, and pulse oximetry monitoring. The procedure was performed with the patient in the prone position under fluoroscopic guidance.  Sterile prep x3 was initiated and I then injected subcutaneous lidocaine to the overlying L5-S1 site after its fluoroscopic identifictation.  Using strict aseptic technique, I then advanced an 18-gauge Tuohy epidural needle in the midline using interlaminar approach via loss-of-resistance to saline technique. There was negative aspiration for heme or  CSF.  I then confirmed position with both AP and Lateral fluoroscan.  2 cc of contrast dye were injected and a  total of 5 mL of Preservative-Free normal saline mixed with 40 mg of Kenalog and 1cc Ropicaine 0.2 percent were injected incrementally via the  epidurally placed needle. The needle was removed. The patient tolerated the injection well and was convalesced and discharged to home in stable condition. Should the patient have any post procedure difficulty they have been  instructed on how to contact us for assistance.    Molli Barrows, MD

## 2020-01-13 NOTE — Progress Notes (Signed)
Safety precautions to be maintained throughout the outpatient stay will include: orient to surroundings, keep bed in low position, maintain call bell within reach at all times, provide assistance with transfer out of bed and ambulation.  

## 2020-01-13 NOTE — Patient Instructions (Signed)

## 2020-01-14 ENCOUNTER — Telehealth: Payer: Self-pay

## 2020-01-14 NOTE — Telephone Encounter (Signed)
Post procedure follow up call.  Patient states she is doing good.

## 2020-01-23 LAB — UDT TEMP OUT OF RANGE

## 2020-01-26 LAB — TOXASSURE SELECT 13 (MW), URINE

## 2020-02-01 ENCOUNTER — Telehealth: Payer: Self-pay

## 2020-02-01 NOTE — Telephone Encounter (Signed)
Meds are out 02-16-20. Will need an appt before then

## 2020-02-01 NOTE — Telephone Encounter (Signed)
Meds are out 02-16-20. Will need an appt before then.

## 2020-02-01 NOTE — Telephone Encounter (Signed)
The pharmacy states she doesn't have any more refills. She would like for someone to check on that for her. Her appt is 02/21/20

## 2020-02-15 ENCOUNTER — Ambulatory Visit
Admission: RE | Admit: 2020-02-15 | Discharge: 2020-02-15 | Disposition: A | Discharge status: Home / Self Care | Payer: Medicaid Other | Source: Ambulatory Visit | Attending: Anesthesiology | Admitting: Anesthesiology

## 2020-02-15 ENCOUNTER — Encounter: Payer: Self-pay | Admitting: Anesthesiology

## 2020-02-15 ENCOUNTER — Ambulatory Visit (HOSPITAL_BASED_OUTPATIENT_CLINIC_OR_DEPARTMENT_OTHER): Payer: Medicaid Other | Admitting: Anesthesiology

## 2020-02-15 ENCOUNTER — Other Ambulatory Visit: Payer: Self-pay | Admitting: Anesthesiology

## 2020-02-15 ENCOUNTER — Other Ambulatory Visit: Payer: Self-pay

## 2020-02-15 VITALS — BP 156/90 | HR 79 | Temp 98.3°F | Resp 16 | Ht 65.5 in | Wt 220.0 lb

## 2020-02-15 DIAGNOSIS — M47816 Spondylosis without myelopathy or radiculopathy, lumbar region: Secondary | ICD-10-CM | POA: Diagnosis not present

## 2020-02-15 DIAGNOSIS — M5441 Lumbago with sciatica, right side: Secondary | ICD-10-CM | POA: Insufficient documentation

## 2020-02-15 DIAGNOSIS — G894 Chronic pain syndrome: Secondary | ICD-10-CM

## 2020-02-15 DIAGNOSIS — R52 Pain, unspecified: Secondary | ICD-10-CM

## 2020-02-15 DIAGNOSIS — M5030 Other cervical disc degeneration, unspecified cervical region: Secondary | ICD-10-CM

## 2020-02-15 DIAGNOSIS — G8929 Other chronic pain: Secondary | ICD-10-CM | POA: Insufficient documentation

## 2020-02-15 DIAGNOSIS — M5442 Lumbago with sciatica, left side: Secondary | ICD-10-CM | POA: Insufficient documentation

## 2020-02-15 DIAGNOSIS — M48062 Spinal stenosis, lumbar region with neurogenic claudication: Secondary | ICD-10-CM | POA: Diagnosis not present

## 2020-02-15 DIAGNOSIS — M542 Cervicalgia: Secondary | ICD-10-CM | POA: Diagnosis not present

## 2020-02-15 DIAGNOSIS — M5431 Sciatica, right side: Secondary | ICD-10-CM

## 2020-02-15 DIAGNOSIS — M549 Dorsalgia, unspecified: Secondary | ICD-10-CM | POA: Diagnosis present

## 2020-02-15 DIAGNOSIS — R531 Weakness: Secondary | ICD-10-CM | POA: Diagnosis not present

## 2020-02-15 DIAGNOSIS — Z79891 Long term (current) use of opiate analgesic: Secondary | ICD-10-CM

## 2020-02-15 DIAGNOSIS — R29898 Other symptoms and signs involving the musculoskeletal system: Secondary | ICD-10-CM

## 2020-02-15 MED ORDER — PREGABALIN 75 MG PO CAPS: 150.0000 mg | ORAL_CAPSULE | Freq: Two times a day (BID) | ORAL | 3 refills | Status: DC

## 2020-02-15 MED ORDER — OXYCODONE HCL 5 MG PO TABS: 5.0000 mg | ORAL_TABLET | ORAL | 0 refills | Status: AC | PRN

## 2020-02-15 MED ORDER — BACLOFEN 10 MG PO TABS: 10.0000 mg | ORAL_TABLET | Freq: Three times a day (TID) | ORAL | 3 refills | Status: AC

## 2020-02-15 NOTE — Progress Notes (Signed)
Nursing Pain Medication Assessment:  Safety precautions to be maintained throughout the outpatient stay will include: orient to surroundings, keep bed in low position, maintain call bell within reach at all times, provide assistance with transfer out of bed and ambulation.  Medication Inspection Compliance: Hannah Maynard did not comply with our request to bring her pills to be counted. She was reminded that bringing the medication bottles, even when empty, is a requirement.  Medication: None brought in. Pill/Patch Count: None available to be counted. Bottle Appearance: No container available. Did not bring bottle(s) to appointment. Filled Date: N/A Last Medication intake:  02/15/20

## 2020-02-15 NOTE — Progress Notes (Signed)
Subjective:  Patient ID: Hannah Maynard, female    DOB: [birth year]  Age: 54 y.o. MRN: [id number]  CC: Back Pain (Lumbar bilateral )   Procedure: None  HPI Hannah Maynard presents for reevaluation. She was last seen on November 18 and had an epidural at that time. She is scheduled for another epidural here in the next week. She presents today for evaluation. She is having some back spasming similar to what she is experienced in the past and persistent lumbar radicular symptoms. She is due for medication refill today. She maintains that she is getting good relief with the medicines and using these as instructed with no side effects reported. She derives good functional lifestyle improvement with the medicines and unfortunately has failed more conservative therapy. No other changes are noted in regards to lower extremity strength or function or bowel or bladder function. Her last epidural gave her approximately 75% improvement lasting over the past 4 weeks  Outpatient Medications Prior to Visit  Medication Sig Dispense Refill  . fluticasone (FLONASE) 50 MCG/ACT nasal spray Place into the nose.    . ibuprofen (ADVIL,MOTRIN) 200 MG tablet Take 200 mg by mouth every 6 (six) hours as needed.    . Nerve Stimulator (ZEWA TENS/EMS COMBO UNIT) DEVI by Does not apply route.    Marland Kitchen PROAIR HFA 108 (90 Base) MCG/ACT inhaler Inhale 2 puffs into the lungs as needed.    . sertraline (ZOLOFT) 100 MG tablet Take 200 mg by mouth at bedtime.     . SYMBICORT 80-4.5 MCG/ACT inhaler Inhale 2 puffs into the lungs 2 (two) times daily.    . baclofen (LIORESAL) 10 MG tablet Take 10 mg by mouth 3 (three) times daily.    Marland Kitchen oxyCODONE (ROXICODONE) 5 MG immediate release tablet Take 1 tablet (5 mg total) by mouth every 4 (four) hours as needed for severe pain. 150 tablet 0  . montelukast (SINGULAIR) 10 MG tablet Take 10 mg by mouth at bedtime.    . sertraline (ZOLOFT) 50 MG tablet Take 200 mg by mouth daily.  (Patient not  taking: Reported on 08/23/2019)    . pregabalin (LYRICA) 75 MG capsule Take 2 capsules (150 mg total) by mouth 2 (two) times daily. 120 capsule 3   Facility-Administered Medications Prior to Visit  Medication Dose Route Frequency Provider Last Rate Last Admin  . lidocaine (PF) (XYLOCAINE) 1 % injection 5 mL  5 mL Subcutaneous Once Molli Barrows, MD      . ropivacaine (PF) 2 mg/mL (0.2%) (NAROPIN) injection 10 mL  10 mL Epidural Once Molli Barrows, MD        Review of Systems CNS: No confusion or sedation Cardiac: No angina or palpitations GI: No abdominal pain or constipation Constitutional: No nausea vomiting fevers or chills  Objective:  BP (!) 156/90 (BP Location: Left Arm, Patient Position: Sitting, Cuff Size: Large)   Pulse 79   Temp 98.3 F (36.8 C) (Oral)   Resp 16   Ht 5' 5.5" (1.664 m)   Wt 220 lb (99.8 kg)   LMP  (LMP Unknown)   SpO2 100%   BMI 36.05 kg/m    BP Readings from Last 3 Encounters:  02/15/20 (!) 156/90  01/13/20 (!) 161/95  10/21/19 (!) 161/99     Wt Readings from Last 3 Encounters:  02/15/20 220 lb (99.8 kg)  01/13/20 220 lb (99.8 kg)  10/21/19 221 lb (100.2 kg)     Physical Exam Pt is alert  and oriented PERRL EOMI HEART IS RRR no murmur or rub LCTA no wheezing or rales MUSCULOSKELETAL. Reveals some paraspinous muscle tenderness. She is using a stroller for ambulation during today's visit. Her muscle tone and bulk is at baseline. Strength testing was not performed.  Labs  No results found for: HGBA1C Lab Results  Component Value Date   CREATININE 0.85 10/28/2017    -------------------------------------------------------------------------------------------------------------------- Lab Results  Component Value Date   GLUCOSE 108 (H) 10/28/2017   AST 49 (H) 10/28/2017   NA 142 10/28/2017   K 4.4 10/28/2017   CL 107 (H) 10/28/2017   CREATININE 0.85 10/28/2017   BUN 9 10/28/2017     --------------------------------------------------------------------------------------------------------------------- No results found.   Assessment & Plan:   Hannah Maynard was seen today for back pain.  Diagnoses and all orders for this visit:  Cervicalgia  Lumbar spondylosis -     Lumbar Epidural Injection  DDD (degenerative disc disease), cervical -     Lumbar Epidural Injection  Chronic bilateral low back pain with bilateral sciatica -     Lumbar Epidural Injection  Chronic pain syndrome  Long term current use of opiate analgesic  Weakness of both lower extremities  Right sided sciatica  Spinal stenosis of lumbar region with neurogenic claudication  Other orders -     oxyCODONE (ROXICODONE) 5 MG immediate release tablet; Take 1 tablet (5 mg total) by mouth every 4 (four) hours as needed for severe pain. -     oxyCODONE (ROXICODONE) 5 MG immediate release tablet; Take 1 tablet (5 mg total) by mouth every 4 (four) hours as needed for severe pain. -     pregabalin (LYRICA) 75 MG capsule; Take 2 capsules (150 mg total) by mouth 2 (two) times daily. -     baclofen (LIORESAL) 10 MG tablet; Take 1 tablet (10 mg total) by mouth 3 (three) times daily.        ----------------------------------------------------------------------------------------------------------------------  Problem List Items Addressed This Visit      Unprioritized   Cervicalgia - Primary   Chronic low back pain (Chronic)   Relevant Medications   oxyCODONE (ROXICODONE) 5 MG immediate release tablet (Start on 02/16/2020)   oxyCODONE (ROXICODONE) 5 MG immediate release tablet (Start on 03/17/2020)   baclofen (LIORESAL) 10 MG tablet   Chronic pain syndrome   Relevant Medications   oxyCODONE (ROXICODONE) 5 MG immediate release tablet (Start on 02/16/2020)   oxyCODONE (ROXICODONE) 5 MG immediate release tablet (Start on 03/17/2020)   pregabalin (LYRICA) 75 MG capsule   baclofen (LIORESAL) 10 MG tablet    DDD (degenerative disc disease), cervical   Relevant Medications   oxyCODONE (ROXICODONE) 5 MG immediate release tablet (Start on 02/16/2020)   oxyCODONE (ROXICODONE) 5 MG immediate release tablet (Start on 03/17/2020)   baclofen (LIORESAL) 10 MG tablet   Long term current use of opiate analgesic   Lumbar spondylosis   Relevant Medications   oxyCODONE (ROXICODONE) 5 MG immediate release tablet (Start on 02/16/2020)   oxyCODONE (ROXICODONE) 5 MG immediate release tablet (Start on 03/17/2020)   baclofen (LIORESAL) 10 MG tablet    Other Visit Diagnoses    Weakness of both lower extremities       Right sided sciatica       Relevant Medications   pregabalin (LYRICA) 75 MG capsule   baclofen (LIORESAL) 10 MG tablet   Spinal stenosis of lumbar region with neurogenic claudication       Relevant Medications   oxyCODONE (ROXICODONE) 5 MG immediate release tablet (  Start on 02/16/2020)   oxyCODONE (ROXICODONE) 5 MG immediate release tablet (Start on 03/17/2020)   pregabalin (LYRICA) 75 MG capsule   baclofen (LIORESAL) 10 MG tablet        ----------------------------------------------------------------------------------------------------------------------  1. Lumbar spondylosis This is scheduled for the next visit in approximately 1 week. We have gone over the risks and benefits of the procedure with her in full detail. I encouraged her to continue with stretching strengthening exercises and imperative efforts at weight loss. - Lumbar Epidural Injection  2. DDD (degenerative disc disease), cervical As above - Lumbar Epidural Injection  3. Chronic bilateral low back pain with bilateral sciatica As above - Lumbar Epidural Injection  4. Cervicalgia As above continue the exercises that we have reviewed.  5. Chronic pain syndrome I have reviewed the [facility] practitioner database information and it is appropriate. Refills will be given today for December 22 and January 21  6.  Long term current use of opiate analgesic As above  7. Weakness of both lower extremities continue efforts at weight loss.  8. Right sided sciatica   9. Spinal stenosis of lumbar region with neurogenic claudication     ----------------------------------------------------------------------------------------------------------------------  I have changed Hannah Maynard's baclofen. I am also having her start on oxyCODONE. Additionally, I am having her maintain her ibuprofen, Zewa TENS/EMS Combo Unit, ProAir HFA, montelukast, sertraline, fluticasone, sertraline, Symbicort, oxyCODONE, and pregabalin. We will continue to administer ropivacaine (PF) 2 mg/mL (0.2%) and lidocaine (PF).   Meds ordered this encounter  Medications  . oxyCODONE (ROXICODONE) 5 MG immediate release tablet    Sig: Take 1 tablet (5 mg total) by mouth every 4 (four) hours as needed for severe pain.    Dispense:  150 tablet    Refill:  0  . oxyCODONE (ROXICODONE) 5 MG immediate release tablet    Sig: Take 1 tablet (5 mg total) by mouth every 4 (four) hours as needed for severe pain.    Dispense:  150 tablet    Refill:  0  . pregabalin (LYRICA) 75 MG capsule    Sig: Take 2 capsules (150 mg total) by mouth 2 (two) times daily.    Dispense:  120 capsule    Refill:  3  . baclofen (LIORESAL) 10 MG tablet    Sig: Take 1 tablet (10 mg total) by mouth 3 (three) times daily.    Dispense:  90 each    Refill:  3   Patient's Medications  New Prescriptions   OXYCODONE (ROXICODONE) 5 MG IMMEDIATE RELEASE TABLET    Take 1 tablet (5 mg total) by mouth every 4 (four) hours as needed for severe pain.  Previous Medications   FLUTICASONE (FLONASE) 50 MCG/ACT NASAL SPRAY    Place into the nose.   IBUPROFEN (ADVIL,MOTRIN) 200 MG TABLET    Take 200 mg by mouth every 6 (six) hours as needed.   MONTELUKAST (SINGULAIR) 10 MG TABLET    Take 10 mg by mouth at bedtime.   NERVE STIMULATOR (ZEWA TENS/EMS COMBO UNIT) DEVI    by Does not  apply route.   PROAIR HFA 108 (90 BASE) MCG/ACT INHALER    Inhale 2 puffs into the lungs as needed.   SERTRALINE (ZOLOFT) 100 MG TABLET    Take 200 mg by mouth at bedtime.    SERTRALINE (ZOLOFT) 50 MG TABLET    Take 200 mg by mouth daily.    SYMBICORT 80-4.5 MCG/ACT INHALER    Inhale 2 puffs into the lungs 2 (  two) times daily.  Modified Medications   Modified Medication Previous Medication   BACLOFEN (LIORESAL) 10 MG TABLET baclofen (LIORESAL) 10 MG tablet      Take 1 tablet (10 mg total) by mouth 3 (three) times daily.    Take 10 mg by mouth 3 (three) times daily.   OXYCODONE (ROXICODONE) 5 MG IMMEDIATE RELEASE TABLET oxyCODONE (ROXICODONE) 5 MG immediate release tablet      Take 1 tablet (5 mg total) by mouth every 4 (four) hours as needed for severe pain.    Take 1 tablet (5 mg total) by mouth every 4 (four) hours as needed for severe pain.   PREGABALIN (LYRICA) 75 MG CAPSULE pregabalin (LYRICA) 75 MG capsule      Take 2 capsules (150 mg total) by mouth 2 (two) times daily.    Take 2 capsules (150 mg total) by mouth 2 (two) times daily.  Discontinued Medications   No medications on file   ----------------------------------------------------------------------------------------------------------------------  Follow-up: Return in about 1 week (around 02/22/2020) for evaluation, procedure.    Molli Barrows, MD

## 2020-02-15 NOTE — Patient Instructions (Signed)
Pain Management Discharge Instructions  General Discharge Instructions :  If you need to reach your doctor call: Monday-Friday 8:00 am - 4:00 pm at [phone] or toll free [phone].  After clinic hours [phone] to have operator reach doctor.  Bring all of your medication bottles to all your appointments in the pain clinic.  To cancel or reschedule your appointment with Pain Management please remember to call 24 hours in advance to avoid a fee.  Refer to the educational materials which you have been given on: General Risks, I had my Procedure. Discharge Instructions, Post Sedation.  Post Procedure Instructions:  The drugs you were given will stay in your system until tomorrow, so for the next 24 hours you should not drive, make any legal decisions or drink any alcoholic beverages.  You may eat anything you prefer, but it is better to start with liquids then soups and crackers, and gradually work up to solid foods.  Please notify your doctor immediately if you have any unusual bleeding, trouble breathing or pain that is not related to your normal pain.  Depending on the type of procedure that was done, some parts of your body may feel week and/or numb.  This usually clears up by tonight or the next day.  Walk with the use of an assistive device or accompanied by an adult for the 24 hours.  You may use ice on the affected area for the first 24 hours.  Put ice in a Ziploc bag and cover with a towel and place against area 15 minutes on 15 minutes off.  You may switch to heat after 24 hours.Epidural Steroid Injection Patient Information  Description: The epidural space surrounds the nerves as they exit the spinal cord.  In some patients, the nerves can be compressed and inflamed by a bulging disc or a tight spinal canal (spinal stenosis).  By injecting steroids into the epidural space, we can bring irritated nerves into direct contact with a potentially helpful medication.  These  steroids act directly on the irritated nerves and can reduce swelling and inflammation which often leads to decreased pain.  Epidural steroids may be injected anywhere along the spine and from the neck to the low back depending upon the location of your pain.   After numbing the skin with local anesthetic (like Novocaine), a small needle is passed into the epidural space slowly.  You may experience a sensation of pressure while this is being done.  The entire block usually last less than 10 minutes.  Conditions which may be treated by epidural steroids:   Low back and leg pain  Neck and arm pain  Spinal stenosis  Post-laminectomy syndrome  Herpes zoster (shingles) pain  Pain from compression fractures  Preparation for the injection:  1. Do not eat any solid food or dairy products within 8 hours of your appointment.  2. You may drink clear liquids up to 3 hours before appointment.  Clear liquids include water, black coffee, juice or soda.  No milk or cream please. 3. You may take your regular medication, including pain medications, with a sip of water before your appointment  Diabetics should hold regular insulin (if taken separately) and take 1/2 normal NPH dos the morning of the procedure.  Carry some sugar containing items with you to your appointment. 4. A driver must accompany you and be prepared to drive you home after your procedure.  5. Bring all your current medications with your. 6. An IV may be inserted and   sedation may be given at the discretion of the physician.   7. A blood pressure cuff, EKG and other monitors will often be applied during the procedure.  Some patients may need to have extra oxygen administered for a short period. 8. You will be asked to provide medical information, including your allergies, prior to the procedure.  We must know immediately if you are taking blood thinners (like Coumadin/Warfarin)  Or if you are allergic to IV iodine contrast (dye). We must  know if you could possible be pregnant.  Possible side-effects:  Bleeding from needle site  Infection (rare, may require surgery)  Nerve injury (rare)  Numbness & tingling (temporary)  Difficulty urinating (rare, temporary)  Spinal headache ( a headache worse with upright posture)  Light -headedness (temporary)  Pain at injection site (several days)  Decreased blood pressure (temporary)  Weakness in arm/leg (temporary)  Pressure sensation in back/neck (temporary)  Call if you experience:  Fever/chills associated with headache or increased back/neck pain.  Headache worsened by an upright position.  New onset weakness or numbness of an extremity below the injection site  Hives or difficulty breathing (go to the emergency room)  Inflammation or drainage at the infection site  Severe back/neck pain  Any new symptoms which are concerning to you  Please note:  Although the local anesthetic injected can often make your back or neck feel good for several hours after the injection, the pain will likely return.  It takes 3-7 days for steroids to work in the epidural space.  You may not notice any pain relief for at least that one week.  If effective, we will often do a series of three injections spaced 3-6 weeks apart to maximally decrease your pain.  After the initial series, we generally will wait several months before considering a repeat injection of the same type.  If you have any questions, please call [phone] [facility] 

## 2020-02-21 ENCOUNTER — Other Ambulatory Visit: Payer: Self-pay | Admitting: Anesthesiology

## 2020-02-21 ENCOUNTER — Ambulatory Visit (HOSPITAL_BASED_OUTPATIENT_CLINIC_OR_DEPARTMENT_OTHER): Payer: Medicaid Other | Admitting: Anesthesiology

## 2020-02-21 ENCOUNTER — Ambulatory Visit
Admission: RE | Admit: 2020-02-21 | Discharge: 2020-02-21 | Disposition: A | Discharge status: Home / Self Care | Payer: Medicaid Other | Source: Ambulatory Visit | Attending: Anesthesiology | Admitting: Anesthesiology

## 2020-02-21 ENCOUNTER — Ambulatory Visit: Payer: No Typology Code available for payment source | Admitting: Anesthesiology

## 2020-02-21 ENCOUNTER — Other Ambulatory Visit: Payer: Self-pay

## 2020-02-21 ENCOUNTER — Encounter: Payer: Self-pay | Admitting: Anesthesiology

## 2020-02-21 VITALS — BP 159/110 | HR 79 | Temp 97.1°F | Resp 20 | Ht 66.0 in | Wt 220.0 lb

## 2020-02-21 DIAGNOSIS — M5442 Lumbago with sciatica, left side: Secondary | ICD-10-CM | POA: Diagnosis not present

## 2020-02-21 DIAGNOSIS — Z79891 Long term (current) use of opiate analgesic: Secondary | ICD-10-CM | POA: Insufficient documentation

## 2020-02-21 DIAGNOSIS — M48062 Spinal stenosis, lumbar region with neurogenic claudication: Secondary | ICD-10-CM | POA: Diagnosis not present

## 2020-02-21 DIAGNOSIS — M5441 Lumbago with sciatica, right side: Secondary | ICD-10-CM | POA: Insufficient documentation

## 2020-02-21 DIAGNOSIS — G8929 Other chronic pain: Secondary | ICD-10-CM | POA: Diagnosis present

## 2020-02-21 DIAGNOSIS — R52 Pain, unspecified: Secondary | ICD-10-CM

## 2020-02-21 DIAGNOSIS — M542 Cervicalgia: Secondary | ICD-10-CM | POA: Insufficient documentation

## 2020-02-21 DIAGNOSIS — Z79899 Other long term (current) drug therapy: Secondary | ICD-10-CM | POA: Insufficient documentation

## 2020-02-21 DIAGNOSIS — M5431 Sciatica, right side: Secondary | ICD-10-CM

## 2020-02-21 DIAGNOSIS — G894 Chronic pain syndrome: Secondary | ICD-10-CM

## 2020-02-21 DIAGNOSIS — M47816 Spondylosis without myelopathy or radiculopathy, lumbar region: Secondary | ICD-10-CM | POA: Insufficient documentation

## 2020-02-21 DIAGNOSIS — M5030 Other cervical disc degeneration, unspecified cervical region: Secondary | ICD-10-CM

## 2020-02-21 MED ORDER — ROPIVACAINE HCL 2 MG/ML IJ SOLN
10.0000 mL | Freq: Once | INTRAMUSCULAR | Status: AC
  Administered 2020-02-21: 10 mL via EPIDURAL

## 2020-02-21 MED ORDER — LIDOCAINE HCL (PF) 1 % IJ SOLN
INTRAMUSCULAR | Status: AC
  Filled 2020-02-21: qty 10

## 2020-02-21 MED ORDER — IOHEXOL 180 MG/ML  SOLN
INTRAMUSCULAR | Status: AC
  Filled 2020-02-21: qty 20

## 2020-02-21 MED ORDER — LIDOCAINE HCL (PF) 1 % IJ SOLN
5.0000 mL | Freq: Once | INTRAMUSCULAR | Status: AC
  Administered 2020-02-21: 5 mL via SUBCUTANEOUS

## 2020-02-21 MED ORDER — ROPIVACAINE HCL 2 MG/ML IJ SOLN
INTRAMUSCULAR | Status: AC
  Filled 2020-02-21: qty 10

## 2020-02-21 MED ORDER — SODIUM CHLORIDE 0.9% FLUSH
10.0000 mL | Freq: Once | INTRAVENOUS | Status: AC
  Administered 2020-02-21: 10 mL

## 2020-02-21 MED ORDER — IOHEXOL 180 MG/ML  SOLN
10.0000 mL | Freq: Once | INTRAMUSCULAR | Status: AC | PRN
  Administered 2020-02-21: 10 mL via EPIDURAL

## 2020-02-21 MED ORDER — TRIAMCINOLONE ACETONIDE 40 MG/ML IJ SUSP
INTRAMUSCULAR | Status: AC
  Filled 2020-02-21: qty 1

## 2020-02-21 MED ORDER — SODIUM CHLORIDE (PF) 0.9 % IJ SOLN
INTRAMUSCULAR | Status: AC
  Filled 2020-02-21: qty 10

## 2020-02-21 MED ORDER — TRIAMCINOLONE ACETONIDE 40 MG/ML IJ SUSP
40.0000 mg | Freq: Once | INTRAMUSCULAR | Status: AC
  Administered 2020-02-21: 40 mg

## 2020-02-21 NOTE — Progress Notes (Signed)
Subjective:  Patient ID: Hannah Maynard, female    DOB: [birth year]  Age: 54 y.o. MRN: [id number]  CC: Back Pain   Procedure: L4-5 epidural steroid under fluoroscopic guidance with no sedation  HPI Hannah Maynard presents for reevaluation.  She continues to have pain in the low back back with radiation to the bilateral calves with associated numbness and tingling affecting both calves and feet.  She occasionally has some give way weakness which is worse with prolonged standing or walking.  She has recently been seen by neurosurgery and found to be a nonsurgical candidate.  Based on their recommendations we are going to proceed with a repeat epidural steroid injection today.  She generally responds favorably to epidurals getting 75 to 80% relief lasting 6 to 8 weeks.  Her last epidural and early November gave her 6 weeks of relief and this was done at the L5-S1 interspace.  She has had even more sustained relief in the past.  Otherwise no change in bowel or bladder function or lower extremity strength or function is noted at this time  Outpatient Medications Prior to Visit  Medication Sig Dispense Refill  . baclofen (LIORESAL) 10 MG tablet Take 1 tablet (10 mg total) by mouth 3 (three) times daily. 90 each 3  . fluticasone (FLONASE) 50 MCG/ACT nasal spray Place into the nose.    . Nerve Stimulator (ZEWA TENS/EMS COMBO UNIT) DEVI by Does not apply route.    Marland Kitchen oxyCODONE (ROXICODONE) 5 MG immediate release tablet Take 1 tablet (5 mg total) by mouth every 4 (four) hours as needed for severe pain. 150 tablet 0  . [START ON 03/17/2020] oxyCODONE (ROXICODONE) 5 MG immediate release tablet Take 1 tablet (5 mg total) by mouth every 4 (four) hours as needed for severe pain. 150 tablet 0  . pregabalin (LYRICA) 75 MG capsule Take 2 capsules (150 mg total) by mouth 2 (two) times daily. 120 capsule 3  . PROAIR HFA 108 (90 Base) MCG/ACT inhaler Inhale 2 puffs into the lungs as needed.    . sertraline  (ZOLOFT) 100 MG tablet Take 200 mg by mouth at bedtime.     . SYMBICORT 80-4.5 MCG/ACT inhaler Inhale 2 puffs into the lungs 2 (two) times daily.    Marland Kitchen ibuprofen (ADVIL,MOTRIN) 200 MG tablet Take 200 mg by mouth every 6 (six) hours as needed. (Patient not taking: Reported on 02/21/2020)    . montelukast (SINGULAIR) 10 MG tablet Take 10 mg by mouth at bedtime.    . sertraline (ZOLOFT) 50 MG tablet Take 200 mg by mouth daily.  (Patient not taking: Reported on 08/23/2019)     Facility-Administered Medications Prior to Visit  Medication Dose Route Frequency Provider Last Rate Last Admin  . lidocaine (PF) (XYLOCAINE) 1 % injection 5 mL  5 mL Subcutaneous Once Molli Barrows, MD      . ropivacaine (PF) 2 mg/mL (0.2%) (NAROPIN) injection 10 mL  10 mL Epidural Once Molli Barrows, MD        Review of Systems CNS: No confusion or sedation Cardiac: No angina or palpitations GI: No abdominal pain or constipation Constitutional: No nausea vomiting fevers or chills  Objective:  BP (!) 159/110   Pulse 79   Temp (!) 97.1 F (36.2 C)   Resp 20   Ht 5\' 6"  (1.676 m)   Wt 220 lb (99.8 kg)   LMP  (LMP Unknown)   SpO2 100%   BMI 35.51 kg/m  BP Readings from Last 3 Encounters:  02/21/20 (!) 159/110  02/15/20 (!) 156/90  01/13/20 (!) 161/95     Wt Readings from Last 3 Encounters:  02/21/20 220 lb (99.8 kg)  02/15/20 220 lb (99.8 kg)  01/13/20 220 lb (99.8 kg)     Physical Exam Pt is alert and oriented PERRL EOMI HEART IS RRR no murmur or rub LCTA no wheezing or rales MUSCULOSKELETAL.  Reveals some paraspinous muscle tenderness.  She presents in her wheelchair today.  Muscle tone and bulk is at baseline.  No other changes noticed from previous examination.  She still has a positive straight leg raise on the left side somewhat equivocal on the right  Labs  No results found for: HGBA1C Lab Results  Component Value Date   CREATININE 0.85 10/28/2017     -------------------------------------------------------------------------------------------------------------------- Lab Results  Component Value Date   GLUCOSE 108 (H) 10/28/2017   AST 49 (H) 10/28/2017   NA 142 10/28/2017   K 4.4 10/28/2017   CL 107 (H) 10/28/2017   CREATININE 0.85 10/28/2017   BUN 9 10/28/2017    --------------------------------------------------------------------------------------------------------------------- DG PAIN CLINIC C-ARM 1-60 MIN NO REPORT  Result Date: 02/21/2020 Fluoro was used, but no Radiologist interpretation will be provided. Please refer to "NOTES" tab for provider progress note.    Assessment & Plan:   Hannah Maynard was seen today for back pain.  Diagnoses and all orders for this visit:  Lumbar spondylosis  DDD (degenerative disc disease), cervical  Chronic bilateral low back pain with bilateral sciatica  Cervicalgia  Chronic pain syndrome  Long term current use of opiate analgesic  Spinal stenosis of lumbar region with neurogenic claudication  Right sided sciatica  Other orders -     triamcinolone acetonide (KENALOG-40) injection 40 mg -     sodium chloride flush (NS) 0.9 % injection 10 mL -     ropivacaine (PF) 2 mg/mL (0.2%) (NAROPIN) injection 10 mL -     lidocaine (PF) (XYLOCAINE) 1 % injection 5 mL -     iohexol (OMNIPAQUE) 180 MG/ML injection 10 mL        ----------------------------------------------------------------------------------------------------------------------  Problem List Items Addressed This Visit      Unprioritized   Bilateral low back pain with bilateral sciatica   Cervicalgia   Chronic pain syndrome   DDD (degenerative disc disease), cervical   Long term current use of opiate analgesic   Lumbar spondylosis - Primary    Other Visit Diagnoses    Spinal stenosis of lumbar region with neurogenic claudication       Relevant Medications   triamcinolone acetonide (KENALOG-40) injection 40 mg  (Completed)   ropivacaine (PF) 2 mg/mL (0.2%) (NAROPIN) injection 10 mL (Completed)   lidocaine (PF) (XYLOCAINE) 1 % injection 5 mL (Completed)   Right sided sciatica            ----------------------------------------------------------------------------------------------------------------------  1. Lumbar spondylosis We will proceed with a repeat epidural today.  The risks and benefits are once again reviewed and all questions answered.  We will try this at the L4-L5 interspace today.  I want her to continue efforts at weight loss and stretching strengthening.  I have encouraged her to stop smoking as well.  2. DDD (degenerative disc disease), cervical Continue with neck exercises as reviewed  3. Chronic bilateral low back pain with bilateral sciatica As above  4. Cervicalgia As above  5. Chronic pain syndrome   6. Long term current use of opiate analgesic   7. Spinal stenosis  of lumbar region with neurogenic claudication   8. Right sided sciatica     ----------------------------------------------------------------------------------------------------------------------  I am having Hannah Maynard maintain her ibuprofen, [place] TENS/EMS Combo Unit, ProAir HFA, montelukast, sertraline, fluticasone, sertraline, Symbicort, oxyCODONE, oxyCODONE, pregabalin, and baclofen. We administered triamcinolone acetonide, sodium chloride flush, ropivacaine (PF) 2 mg/mL (0.2%), lidocaine (PF), and iohexol. We will continue to administer ropivacaine (PF) 2 mg/mL (0.2%) and lidocaine (PF).   Meds ordered this encounter  Medications  . triamcinolone acetonide (KENALOG-40) injection 40 mg  . sodium chloride flush (NS) 0.9 % injection 10 mL  . ropivacaine (PF) 2 mg/mL (0.2%) (NAROPIN) injection 10 mL  . lidocaine (PF) (XYLOCAINE) 1 % injection 5 mL  . iohexol (OMNIPAQUE) 180 MG/ML injection 10 mL   Patient's Medications  New Prescriptions   No medications on file  Previous Medications    BACLOFEN (LIORESAL) 10 MG TABLET    Take 1 tablet (10 mg total) by mouth 3 (three) times daily.   FLUTICASONE (FLONASE) 50 MCG/ACT NASAL SPRAY    Place into the nose.   IBUPROFEN (ADVIL,MOTRIN) 200 MG TABLET    Take 200 mg by mouth every 6 (six) hours as needed.   MONTELUKAST (SINGULAIR) 10 MG TABLET    Take 10 mg by mouth at bedtime.   NERVE STIMULATOR (ZEWA TENS/EMS COMBO UNIT) DEVI    by Does not apply route.   OXYCODONE (ROXICODONE) 5 MG IMMEDIATE RELEASE TABLET    Take 1 tablet (5 mg total) by mouth every 4 (four) hours as needed for severe pain.   OXYCODONE (ROXICODONE) 5 MG IMMEDIATE RELEASE TABLET    Take 1 tablet (5 mg total) by mouth every 4 (four) hours as needed for severe pain.   PREGABALIN (LYRICA) 75 MG CAPSULE    Take 2 capsules (150 mg total) by mouth 2 (two) times daily.   PROAIR HFA 108 (90 BASE) MCG/ACT INHALER    Inhale 2 puffs into the lungs as needed.   SERTRALINE (ZOLOFT) 100 MG TABLET    Take 200 mg by mouth at bedtime.    SERTRALINE (ZOLOFT) 50 MG TABLET    Take 200 mg by mouth daily.    SYMBICORT 80-4.5 MCG/ACT INHALER    Inhale 2 puffs into the lungs 2 (two) times daily.  Modified Medications   No medications on file  Discontinued Medications   No medications on file   ----------------------------------------------------------------------------------------------------------------------  Follow-up: Return in about 2 months (around 04/23/2020) for evaluation, med refill.   Procedure: L4-5 LESI with fluoroscopic guidance and no moderate sedation  NOTE: The risks, benefits, and expectations of the procedure have been discussed and explained to the patient who was understanding and in agreement with suggested treatment plan. No guarantees were made.  DESCRIPTION OF PROCEDURE: Lumbar epidural steroid injection with no IV Versed, EKG, blood pressure, pulse, and pulse oximetry monitoring. The procedure was performed with the patient in the prone position under  fluoroscopic guidance.  Sterile prep x3 was initiated and I then injected subcutaneous lidocaine to the overlying L4-5 site after its fluoroscopic identifictation.  Using strict aseptic technique, I then advanced an 18-gauge Tuohy epidural needle in the midline using interlaminar approach via loss-of-resistance to saline technique. There was negative aspiration for heme or  CSF.  I then confirmed position with both AP and Lateral fluoroscan.  2 cc of contrast dye were injected and a  total of 5 mL of Preservative-Free normal saline mixed with 40 mg of Kenalog and 1cc Ropicaine 0.2 percent  were injected incrementally via the  epidurally placed needle. The needle was removed. The patient tolerated the injection well and was convalesced and discharged to home in stable condition. Should the patient have any post procedure difficulty they have been instructed on how to contact us for assistance.    Molli Barrows, MD

## 2020-02-21 NOTE — Progress Notes (Signed)
Safety precautions to be maintained throughout the outpatient stay will include: orient to surroundings, keep bed in low position, maintain call bell within reach at all times, provide assistance with transfer out of bed and ambulation.  

## 2020-02-21 NOTE — Patient Instructions (Signed)

## 2020-03-15 ENCOUNTER — Emergency Department
Admission: EM | Admit: 2020-03-15 | Discharge: 2020-03-15 | Disposition: A | Discharge status: Home / Self Care | Payer: Medicaid Other | Attending: Emergency Medicine | Admitting: Emergency Medicine

## 2020-03-15 DIAGNOSIS — E860 Dehydration: Secondary | ICD-10-CM | POA: Insufficient documentation

## 2020-03-15 DIAGNOSIS — R55 Syncope and collapse: Secondary | ICD-10-CM | POA: Diagnosis not present

## 2020-03-15 DIAGNOSIS — I951 Orthostatic hypotension: Secondary | ICD-10-CM

## 2020-03-15 DIAGNOSIS — F17210 Nicotine dependence, cigarettes, uncomplicated: Secondary | ICD-10-CM | POA: Diagnosis not present

## 2020-03-15 DIAGNOSIS — U071 COVID-19: Secondary | ICD-10-CM

## 2020-03-15 DIAGNOSIS — R059 Cough, unspecified: Secondary | ICD-10-CM | POA: Diagnosis present

## 2020-03-15 LAB — BASIC METABOLIC PANEL
Anion gap: 11 (ref 5–15)
BUN: 11 mg/dL (ref 6–20)
CO2: 22 mmol/L (ref 22–32)
Calcium: 8.6 mg/dL — ABNORMAL LOW (ref 8.9–10.3)
Chloride: 103 mmol/L (ref 98–111)
Creatinine, Ser: 1.18 mg/dL — ABNORMAL HIGH (ref 0.44–1.00)
GFR, Estimated: 54 mL/min — ABNORMAL LOW (ref >60)
Glucose, Bld: 115 mg/dL — ABNORMAL HIGH (ref 70–99)
Potassium: 3.4 mmol/L — ABNORMAL LOW (ref 3.5–5.1)
Sodium: 136 mmol/L (ref 135–145)

## 2020-03-15 LAB — CBC
HCT: 45.9 % (ref 36.0–46.0)
Hemoglobin: 14.9 g/dL (ref 12.0–15.0)
MCH: 30.7 pg (ref 26.0–34.0)
MCHC: 32.5 g/dL (ref 30.0–36.0)
MCV: 94.6 fL (ref 80.0–100.0)
Platelets: 177 10*3/uL (ref 150–400)
RBC: 4.85 MIL/uL (ref 3.87–5.11)
RDW: 12.4 % (ref 11.5–15.5)
WBC: 5.3 10*3/uL (ref 4.0–10.5)
nRBC: 0 % (ref 0.0–0.2)

## 2020-03-15 LAB — POC SARS CORONAVIRUS 2 AG -  ED: SARS Coronavirus 2 Ag: POSITIVE — AB

## 2020-03-15 MED ORDER — SODIUM CHLORIDE 0.9 % IV BOLUS
1000.0000 mL | Freq: Once | INTRAVENOUS | Status: AC
  Administered 2020-03-15: 1000 mL via INTRAVENOUS

## 2020-03-15 MED ORDER — ONDANSETRON HCL 4 MG/2ML IJ SOLN
4.0000 mg | Freq: Once | INTRAMUSCULAR | Status: AC
  Administered 2020-03-15: 4 mg via INTRAVENOUS
  Filled 2020-03-15: qty 2
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
## 2020-03-15 NOTE — ED Triage Notes (Signed)
Patient reports she has not been feeling well for 1 week, cough, aches and chills. Patient reports she went to have bowel movement and had syncopal episode. Per EMS patient BP 70/40 in route to ER. Patient A&OX3 on arrival to ER. Patient reports she has had stomach pains/nausea for 2 days since drinking liquor. Patient BP 112//71 on arrival to ER post 543ml NS given by EMS.

## 2020-03-22 ENCOUNTER — Ambulatory Visit
Admission: EM | Admit: 2020-03-22 | Discharge: 2020-03-22 | Disposition: A | Discharge status: Home / Self Care | Payer: Medicaid Other | Attending: Sports Medicine | Admitting: Sports Medicine

## 2020-03-22 ENCOUNTER — Encounter: Payer: Self-pay | Admitting: Emergency Medicine

## 2020-03-22 ENCOUNTER — Other Ambulatory Visit: Payer: Self-pay

## 2020-03-22 ENCOUNTER — Ambulatory Visit: Payer: Self-pay | Admitting: *Deleted

## 2020-03-22 ENCOUNTER — Emergency Department
Admission: EM | Admit: 2020-03-22 | Discharge: 2020-03-22 | Disposition: A | Discharge status: Home / Self Care | Payer: Medicaid Other | Attending: Emergency Medicine | Admitting: Emergency Medicine

## 2020-03-22 DIAGNOSIS — H53142 Visual discomfort, left eye: Secondary | ICD-10-CM | POA: Diagnosis not present

## 2020-03-22 DIAGNOSIS — H539 Unspecified visual disturbance: Secondary | ICD-10-CM | POA: Diagnosis not present

## 2020-03-22 DIAGNOSIS — H5712 Ocular pain, left eye: Secondary | ICD-10-CM

## 2020-03-22 DIAGNOSIS — H209 Unspecified iridocyclitis: Secondary | ICD-10-CM | POA: Diagnosis not present

## 2020-03-22 DIAGNOSIS — H169 Unspecified keratitis: Secondary | ICD-10-CM

## 2020-03-22 DIAGNOSIS — U071 COVID-19: Secondary | ICD-10-CM | POA: Diagnosis not present

## 2020-03-22 DIAGNOSIS — F17210 Nicotine dependence, cigarettes, uncomplicated: Secondary | ICD-10-CM | POA: Insufficient documentation

## 2020-03-22 DIAGNOSIS — H16002 Unspecified corneal ulcer, left eye: Secondary | ICD-10-CM | POA: Insufficient documentation

## 2020-03-22 MED ORDER — FLUORESCEIN SODIUM 1 MG OP STRP
1.0000 | ORAL_STRIP | Freq: Once | OPHTHALMIC | Status: AC
  Administered 2020-03-22: 1 via OPHTHALMIC
  Filled 2020-03-22: qty 1

## 2020-03-22 MED ORDER — MOXIFLOXACIN HCL 0.5 % OP SOLN: 1.0000 [drp] | OPHTHALMIC | 0 refills | Status: AC

## 2020-03-22 MED ORDER — TETRACAINE HCL 0.5 % OP SOLN
1.0000 [drp] | Freq: Once | OPHTHALMIC | Status: AC
  Administered 2020-03-22: 1 [drp] via OPHTHALMIC
  Filled 2020-03-22: qty 4

## 2020-03-22 MED ORDER — VALACYCLOVIR HCL 500 MG PO TABS: 1000.0000 mg | ORAL_TABLET | Freq: Three times a day (TID) | ORAL | 0 refills | Status: AC

## 2020-03-22 MED ORDER — MOXIFLOXACIN HCL 0.5 % OP SOLN
1.0000 [drp] | OPHTHALMIC | Status: DC
  Administered 2020-03-22: 1 [drp] via OPHTHALMIC
  Filled 2020-03-22 (×2): qty 3

## 2020-03-22 MED ORDER — VALACYCLOVIR HCL 500 MG PO TABS
1000.0000 mg | ORAL_TABLET | Freq: Once | ORAL | Status: AC
  Administered 2020-03-22: 1000 mg via ORAL
  Filled 2020-03-22: qty 2

## 2020-03-22 NOTE — ED Triage Notes (Signed)
Pt reports for the past 4 days her left eye has been draining and it hurts to open her eyes. Pt left eye red with some drainage noted around it. Pt right eye clear. Pt states not able to open eyes when in the light.
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
## 2020-03-22 NOTE — Discharge Instructions (Signed)
Given the fact that I could not get an adequate examination, in fact a very limited exam I have recommended urgent referral to [facility].  The nursing staff called and they wanted to see her immediately.  Her husband will drive her directly to the [place] office. Transfer care to [facility] at this time and she will follow up with [place] as needed.

## 2020-03-22 NOTE — ED Triage Notes (Signed)
Patient c/o left eye pain that started 1 week ago. She states she tested positive for COVID on 1/19. She was given eye drops the same day and she states this has not helped her pain at all.

## 2020-03-22 NOTE — Discharge Instructions (Signed)
Please present to the [facility] at 8:30 AM in the morning for evaluation of your left eye. Dr. Wallace Going will see you. Please use the vigamox eye drops every 2 hours in the left eye, and take the Valtrex, 1000mg  3x daily.

## 2020-03-22 NOTE — ED Notes (Signed)
Husband signed printed paper consent for discharge. Computer in room not working. Patient unable to sign d/t being unable to see.

## 2020-03-22 NOTE — Telephone Encounter (Signed)
  Patient states she was diagnosed with COVID on 1/19- she was seen 2 days ago for left eye pain and diagnosed with viral conjunctivitis. Patient states the drops for her eye are not helping and she is in a lot of pain- advised patient UC for evaluation of symptoms. Reason for Disposition . Severe eye pain  Answer Assessment - Initial Assessment Questions 1. ONSET: "When did the pain start?" (e.g., minutes, hours, days)     1 week- patient was seen for L eye pain- diagnosed with COVID in eye 2. TIMING: "Does the pain come and go, or has it been constant since it started?" (e.g., constant, intermittent, fleeting)     constant 3. SEVERITY: "How bad is the pain?"   (Scale 1-10; mild, moderate or severe)   - MILD (1-3): doesn't interfere with normal activities    - MODERATE (4-7): interferes with normal activities or awakens from sleep    - SEVERE (8-10): excruciating pain and patient unable to do normal activities     severe 4. LOCATION: "Where does it hurt?"  (e.g., eyelid, eye, cheekbone)     In eye and behind 5. CAUSE: "What do you think is causing the pain?"     Viral infection in the eye 6. VISION: "Do you have blurred vision or changes in your vision?"      blurred 7. EYE DISCHARGE: "Is there any discharge (pus) from the eye(s)?"  If yes, ask: "What color is it?"      Draining-tearing- clear 8. FEVER: "Do you have a fever?" If Yes, ask: "What is it, how was it measured, and when did it start?"      No fever 9. OTHER SYMPTOMS: "Do you have any other symptoms?" (e.g., headache, nasal discharge, facial rash)     headache 10. PREGNANCY: "Is there any chance you are pregnant?" "When was your last menstrual period?"       n/a  Protocols used: EYE PAIN-A-AH
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
## 2020-04-17 ENCOUNTER — Telehealth: Payer: Self-pay | Admitting: Anesthesiology

## 2020-04-17 NOTE — Telephone Encounter (Signed)
Patient called stating she does not have a script to fill today. She was last seen 02-21-20 . I have added her to schedule as Virtual for tomorrow. Please see if Dr. Andree Elk can send in one script to fill today.  Please advise patient

## 2020-04-18 ENCOUNTER — Ambulatory Visit: Payer: Medicaid Other | Attending: Anesthesiology | Admitting: Anesthesiology

## 2020-04-18 ENCOUNTER — Other Ambulatory Visit: Payer: Self-pay

## 2020-04-18 ENCOUNTER — Encounter: Payer: Self-pay | Admitting: Anesthesiology

## 2020-04-18 DIAGNOSIS — M542 Cervicalgia: Secondary | ICD-10-CM

## 2020-04-18 DIAGNOSIS — M47816 Spondylosis without myelopathy or radiculopathy, lumbar region: Secondary | ICD-10-CM

## 2020-04-18 DIAGNOSIS — M5441 Lumbago with sciatica, right side: Secondary | ICD-10-CM

## 2020-04-18 DIAGNOSIS — M5442 Lumbago with sciatica, left side: Secondary | ICD-10-CM | POA: Diagnosis not present

## 2020-04-18 DIAGNOSIS — Z79891 Long term (current) use of opiate analgesic: Secondary | ICD-10-CM

## 2020-04-18 DIAGNOSIS — M5030 Other cervical disc degeneration, unspecified cervical region: Secondary | ICD-10-CM | POA: Diagnosis not present

## 2020-04-18 DIAGNOSIS — G8929 Other chronic pain: Secondary | ICD-10-CM

## 2020-04-18 DIAGNOSIS — G894 Chronic pain syndrome: Secondary | ICD-10-CM

## 2020-04-18 DIAGNOSIS — M48062 Spinal stenosis, lumbar region with neurogenic claudication: Secondary | ICD-10-CM

## 2020-04-18 MED ORDER — OXYCODONE HCL 5 MG PO TABS: 5.0000 mg | ORAL_TABLET | ORAL | 0 refills | Status: AC | PRN

## 2020-04-18 NOTE — Progress Notes (Addendum)
Virtual Visit via Telephone Note  I connected with [place] on 04/18/20 at  2:15 PM EST by telephone and verified that I am speaking with the correct person using two identifiers.  Location: Patient: Home Provider: Pain control center   I discussed the limitations, risks, security and privacy concerns of performing an evaluation and management service by telephone and the availability of in person appointments. I also discussed with the patient that there may be a patient responsible charge related to this service. The patient expressed understanding and agreed to proceed.   History of Present Illness: I spoke with [name] today regarding her low back pain and sciatica symptoms.  She was unable to do the video portion of the virtual conference but via telephone she reports that she continues to get good relief with the bilateral sciatica symptoms.  The epidurals worked well for her.  Her last epidural was back in December and she has approximately 75 to 85% relief she reports.  Over the past week she has begun to get some recurrence and she has called for appointment secondary to that.  She still takes her medications 5 times a day the 5 mg strength of oxycodone and this works well for her.  However she continues to have breakthrough pain despite this and her overall pain control is better following periodic epidurals.  Last year she required epidurals approximately every 2 months and it has been that since her last visit and she would like to return for a March epidural if possible.  Otherwise she is trying efforts at weight loss and stretching strengthening as best she can but these have limited benefit.  Her neck pain has been stable furthermore.  No other changes are reported today and no side effects are noted with the medications.   Observations/Objective:  Current Outpatient Medications:  .  oxyCODONE (ROXICODONE) 5 MG immediate release tablet, Take 1 tablet (5 mg total) by mouth  every 4 (four) hours as needed for severe pain., Disp: 150 tablet, Rfl: 0 .  [START ON 05/18/2020] oxyCODONE (ROXICODONE) 5 MG immediate release tablet, Take 1 tablet (5 mg total) by mouth every 4 (four) hours as needed for severe pain., Disp: 150 tablet, Rfl: 0 .  fluticasone (FLONASE) 50 MCG/ACT nasal spray, Place into the nose., Disp: , Rfl:  .  ibuprofen (ADVIL,MOTRIN) 200 MG tablet, Take 200 mg by mouth every 6 (six) hours as needed. (Patient not taking: Reported on 02/21/2020), Disp: , Rfl:  .  montelukast (SINGULAIR) 10 MG tablet, Take 10 mg by mouth at bedtime., Disp: , Rfl:  .  Nerve Stimulator (ZEWA TENS/EMS COMBO UNIT) DEVI, by Does not apply route., Disp: , Rfl:  .  pregabalin (LYRICA) 75 MG capsule, Take 2 capsules (150 mg total) by mouth 2 (two) times daily., Disp: 120 capsule, Rfl: 3 .  PROAIR HFA 108 (90 Base) MCG/ACT inhaler, Inhale 2 puffs into the lungs as needed., Disp: , Rfl:  .  sertraline (ZOLOFT) 100 MG tablet, Take 200 mg by mouth at bedtime. , Disp: , Rfl:  .  sertraline (ZOLOFT) 50 MG tablet, Take 200 mg by mouth daily.  (Patient not taking: Reported on 08/23/2019), Disp: , Rfl:  .  SYMBICORT 80-4.5 MCG/ACT inhaler, Inhale 2 puffs into the lungs 2 (two) times daily., Disp: , Rfl:   Current Facility-Administered Medications:  .  lidocaine (PF) (XYLOCAINE) 1 % injection 5 mL, 5 mL, Subcutaneous, Once, Molli Barrows, MD .  ropivacaine (PF) 2 mg/mL (  0.2%) (NAROPIN) injection 10 mL, 10 mL, Epidural, Once, Andree Elk Alvina Filbert, MD  Assessment and Plan: 1. Lumbar spondylosis   2. DDD (degenerative disc disease), cervical   3. Chronic bilateral low back pain with bilateral sciatica   4. Cervicalgia   5. Chronic pain syndrome   6. Long term current use of opiate analgesic   7. Spinal stenosis of lumbar region with neurogenic claudication   8. Chronic neck pain   Based on our discussion today and upon review of the [facility] practitioner database information going to refill  her medications for today for the oxycodone 5 mg tablets and for 1 month from today.  No changes will be made in dosing.  I will schedule her for return to clinic in 1 month and an epidural at that time.  We have reviewed the risks and benefits of the procedure in the meantime of encouraged to continue with efforts at weight loss stretching strengthening.  She is to continue with her primary care physicians for baseline medical care as well  Follow Up Instructions:    I discussed the assessment and treatment plan with the patient. The patient was provided an opportunity to ask questions and all were answered. The patient agreed with the plan and demonstrated an understanding of the instructions.   The patient was advised to call back or seek an in-person evaluation if the symptoms worsen or if the condition fails to improve as anticipated.  I provided 30 minutes of non-face-to-face time during this encounter. Addendum: For further clarification, [name] reports that she generally gets 50 to 75% relief lasting approximately 6 to 8 weeks before she gets gradual recurrence of her baseline pain.  Furthermore she has been doing stretching strengthening exercises actively, as tolerated without significant benefit.  Previously, she has been through physical therapy regimen with limited success.  The most effective therapy for her has been the epidural steroid injections.  As such, she receives these periodically to help with her pain management protocol.  Molli Barrows, MD

## 2020-05-10 ENCOUNTER — Ambulatory Visit: Payer: Medicaid Other | Admitting: Anesthesiology

## 2020-05-17 ENCOUNTER — Telehealth: Payer: Self-pay | Admitting: Anesthesiology

## 2020-05-17 NOTE — Telephone Encounter (Signed)
Pharmacy called and they expect the shipment of oxycodone to be there by tomorrow. The script is not to be filled until tomorrow. Called patient and explained to check tomorrow for refill.

## 2020-05-17 NOTE — Telephone Encounter (Signed)
Patient states [facility] doesn't have her pain meds, she [place] told they can fill the script.

## 2020-05-18 MED ORDER — HYDROCODONE-ACETAMINOPHEN 7.5-325 MG PO TABS: 1.0000 | ORAL_TABLET | Freq: Four times a day (QID) | ORAL | 0 refills | Status: AC | PRN

## 2020-05-18 NOTE — Addendum Note (Signed)
Addended by: Molli Barrows on: 05/18/2020 07:19 PM   Modules accepted: Orders

## 2020-05-23 ENCOUNTER — Telehealth: Payer: Self-pay

## 2020-05-23 NOTE — Telephone Encounter (Signed)
She called regarding her procedure, which I informed her was denied by her insurance. She wanted to ask Dr. Andree Elk about sending her a new script for pain medicine. The hydrocodone is not working, she wants her regular medicine. She states she is willing to bring back the hydrocodone if she can get a new script. She is really hurting, and cant get her shot because of insurance.

## 2020-05-23 NOTE — Telephone Encounter (Signed)
I discussed this with her yesterday.  I spoke to Dr Andree Elk and he states no changes in her prescription until her next appointment when her meds are due.  She stated she understood this yesterday when I talked to her.

## 2020-05-29 ENCOUNTER — Telehealth: Payer: Self-pay

## 2020-05-29 NOTE — Telephone Encounter (Signed)
I will let Dr. Andree Elk know because he does 100% of the charting on his patients.

## 2020-05-29 NOTE — Telephone Encounter (Signed)
IN order to get the epidural approved, they have to have documentation of the percentage of relief since last procedure as well as how long she got relief. It has to be at least 50% for 4 weeks. Also, it must be documented that she has physician directed home exercises for her low back. Please make a note that includes all of this. She is calling every day and I am doing all I can to get it approved but her insurance company wants you to jump through hoops to get it. Thanks

## 2020-05-31 NOTE — Telephone Encounter (Signed)
Dr. Andree Elk said he put in the addendum but its not in there. I saw yesterday where he tried to put it in but even that is gone now. I sent him a message about it but it's gone too. Can you tell him its not in there?

## 2020-06-12 ENCOUNTER — Other Ambulatory Visit: Payer: Self-pay

## 2020-06-12 ENCOUNTER — Ambulatory Visit (HOSPITAL_BASED_OUTPATIENT_CLINIC_OR_DEPARTMENT_OTHER): Payer: Medicaid Other | Admitting: Anesthesiology

## 2020-06-12 ENCOUNTER — Ambulatory Visit
Admission: RE | Admit: 2020-06-12 | Discharge: 2020-06-12 | Disposition: A | Discharge status: Home / Self Care | Payer: Medicaid Other | Source: Ambulatory Visit | Attending: Anesthesiology | Admitting: Anesthesiology

## 2020-06-12 ENCOUNTER — Encounter: Payer: Self-pay | Admitting: Anesthesiology

## 2020-06-12 ENCOUNTER — Other Ambulatory Visit: Payer: Self-pay | Admitting: Anesthesiology

## 2020-06-12 ENCOUNTER — Telehealth: Payer: Self-pay

## 2020-06-12 VITALS — BP 174/96 | HR 89 | Temp 97.0°F | Resp 20 | Ht 65.0 in | Wt 230.0 lb

## 2020-06-12 DIAGNOSIS — G8929 Other chronic pain: Secondary | ICD-10-CM

## 2020-06-12 DIAGNOSIS — M48062 Spinal stenosis, lumbar region with neurogenic claudication: Secondary | ICD-10-CM | POA: Insufficient documentation

## 2020-06-12 DIAGNOSIS — M5441 Lumbago with sciatica, right side: Secondary | ICD-10-CM | POA: Diagnosis not present

## 2020-06-12 DIAGNOSIS — M5442 Lumbago with sciatica, left side: Secondary | ICD-10-CM | POA: Diagnosis not present

## 2020-06-12 DIAGNOSIS — R52 Pain, unspecified: Secondary | ICD-10-CM

## 2020-06-12 DIAGNOSIS — Z79891 Long term (current) use of opiate analgesic: Secondary | ICD-10-CM

## 2020-06-12 DIAGNOSIS — R29898 Other symptoms and signs involving the musculoskeletal system: Secondary | ICD-10-CM | POA: Diagnosis not present

## 2020-06-12 DIAGNOSIS — M5030 Other cervical disc degeneration, unspecified cervical region: Secondary | ICD-10-CM

## 2020-06-12 DIAGNOSIS — M542 Cervicalgia: Secondary | ICD-10-CM | POA: Diagnosis not present

## 2020-06-12 DIAGNOSIS — G894 Chronic pain syndrome: Secondary | ICD-10-CM | POA: Diagnosis not present

## 2020-06-12 DIAGNOSIS — Z79899 Other long term (current) drug therapy: Secondary | ICD-10-CM | POA: Insufficient documentation

## 2020-06-12 DIAGNOSIS — M47816 Spondylosis without myelopathy or radiculopathy, lumbar region: Secondary | ICD-10-CM

## 2020-06-12 MED ORDER — ROPIVACAINE HCL 2 MG/ML IJ SOLN
10.0000 mL | Freq: Once | INTRAMUSCULAR | Status: AC
  Administered 2020-06-12: 10 mL via EPIDURAL

## 2020-06-12 MED ORDER — LIDOCAINE HCL (PF) 1 % IJ SOLN
5.0000 mL | Freq: Once | INTRAMUSCULAR | Status: AC
  Administered 2020-06-12: 5 mL via SUBCUTANEOUS

## 2020-06-12 MED ORDER — TRIAMCINOLONE ACETONIDE 40 MG/ML IJ SUSP
40.0000 mg | Freq: Once | INTRAMUSCULAR | Status: AC
  Administered 2020-06-12: 40 mg

## 2020-06-12 MED ORDER — TRIAMCINOLONE ACETONIDE 40 MG/ML IJ SUSP
INTRAMUSCULAR | Status: AC
  Filled 2020-06-12: qty 1

## 2020-06-12 MED ORDER — IOHEXOL 180 MG/ML  SOLN
10.0000 mL | Freq: Once | INTRAMUSCULAR | Status: AC | PRN
  Administered 2020-06-12: 10 mL via EPIDURAL

## 2020-06-12 MED ORDER — SODIUM CHLORIDE 0.9% FLUSH
10.0000 mL | Freq: Once | INTRAVENOUS | Status: AC
  Administered 2020-06-12: 10 mL

## 2020-06-12 MED ORDER — SODIUM CHLORIDE (PF) 0.9 % IJ SOLN
INTRAMUSCULAR | Status: AC
  Filled 2020-06-12: qty 10

## 2020-06-12 MED ORDER — IOHEXOL 180 MG/ML  SOLN
INTRAMUSCULAR | Status: AC
  Filled 2020-06-12: qty 20

## 2020-06-12 MED ORDER — ROPIVACAINE HCL 2 MG/ML IJ SOLN
INTRAMUSCULAR | Status: AC
  Filled 2020-06-12: qty 10

## 2020-06-12 MED ORDER — LIDOCAINE HCL (PF) 1 % IJ SOLN
INTRAMUSCULAR | Status: AC
  Filled 2020-06-12: qty 5

## 2020-06-12 NOTE — Progress Notes (Signed)
Nursing Pain Medication Assessment:  Safety precautions to be maintained throughout the outpatient stay will include: orient to surroundings, keep bed in low position, maintain call bell within reach at all times, provide assistance with transfer out of bed and ambulation.  Medication Inspection Compliance: Hannah Maynard did not comply with our request to bring her pills to be counted. She was reminded that bringing the medication bottles, even when empty, is a requirement.  Medication: None brought in. Pill/Patch Count: None available to be counted. Bottle Appearance: No container available. Did not bring bottle(s) to appointment. Filled Date: N/A Last Medication intake:  Today

## 2020-06-12 NOTE — Progress Notes (Signed)
Subjective:  Patient ID: Hannah Maynard, female    DOB: [birth year]  Age: 55 y.o. MRN: [id number]  CC: Back Pain (lower)   Procedure: L5-S1 epidural steroid with fluoroscopic guidance and no sedation  HPI Hannah Maynard presents for epidural steroid injection.  She continues to have bilateral lower extremity sciatica symptoms.  She has chronic aching incapacitating low back pain with radiation into the bilateral hips buttocks and down the posterior lateral legs.  This radiates into both calves and has associated intermittent numbness and tingling.  Her strength is been intermittent and occasionally she has giveaway weakness but no persistent weakness.  Her bowel and bladder function been stable.  She has been on chronic opioid therapy in the past with significant improvement.  She reports that recently her medications have run out.  She would like to restart opioid therapy if possible.  Otherwise she is in her usual state of health and despite efforts with conservative care stretching strengthening physical therapy exercises she continues to have severe pain.  She generally gets epidurals periodically to help with pain management and she gets 75 to 80% relief generally lasting several months before she has recurrence of the same quality pain.  Otherwise no changes are reported today.  Outpatient Medications Prior to Visit  Medication Sig Dispense Refill  . fluticasone (FLONASE) 50 MCG/ACT nasal spray Place into the nose.    . Nerve Stimulator (ZEWA TENS/EMS COMBO UNIT) DEVI by Does not apply route.    Marland Kitchen oxyCODONE (ROXICODONE) 5 MG immediate release tablet Take 1 tablet (5 mg total) by mouth every 4 (four) hours as needed for severe pain. 150 tablet 0  . PROAIR HFA 108 (90 Base) MCG/ACT inhaler Inhale 2 puffs into the lungs as needed.    . sertraline (ZOLOFT) 100 MG tablet Take 200 mg by mouth at bedtime.     . SYMBICORT 80-4.5 MCG/ACT inhaler Inhale 2 puffs into the lungs 2 (two) times daily.     . baclofen (LIORESAL) 10 MG tablet Take 10 mg by mouth 3 (three) times daily.    . benzonatate (TESSALON) 200 MG capsule     . buPROPion (WELLBUTRIN SR) 150 MG 12 hr tablet     . HYDROcodone-acetaminophen (NORCO) 7.5-325 MG tablet Take 1 tablet by mouth every 6 (six) hours as needed for moderate pain or severe pain. (Patient not taking: Reported on 06/12/2020) 120 tablet 0  . ibuprofen (ADVIL,MOTRIN) 200 MG tablet Take 200 mg by mouth every 6 (six) hours as needed. (Patient not taking: No sig reported)    . montelukast (SINGULAIR) 10 MG tablet Take 10 mg by mouth at bedtime.    . prednisoLONE acetate (PRED FORTE) 1 % ophthalmic suspension SHAKE LIQUID AND INSTILL 1 DROP IN LEFT EYE THREE TIMES DAILY    . pregabalin (LYRICA) 75 MG capsule Take 2 capsules (150 mg total) by mouth 2 (two) times daily. 120 capsule 3  . sertraline (ZOLOFT) 50 MG tablet Take 200 mg by mouth daily.  (Patient not taking: Reported on 08/23/2019)     Facility-Administered Medications Prior to Visit  Medication Dose Route Frequency Provider Last Rate Last Admin  . lidocaine (PF) (XYLOCAINE) 1 % injection 5 mL  5 mL Subcutaneous Once Molli Barrows, MD      . ropivacaine (PF) 2 mg/mL (0.2%) (NAROPIN) injection 10 mL  10 mL Epidural Once Molli Barrows, MD        Review of Systems CNS: No confusion or sedation Cardiac:  No angina or palpitations GI: No abdominal pain or constipation Constitutional: No nausea vomiting fevers or chills  Objective:  BP (!) 174/96   Pulse 89 Comment: nsr  Temp (!) 97 F (36.1 C) (Temporal)   Resp 20   Ht 5\' 5"  (1.651 m)   Wt 230 lb (104.3 kg)   LMP  (LMP Unknown)   SpO2 99%   BMI 38.27 kg/m    BP Readings from Last 3 Encounters:  06/12/20 (!) 174/96  03/22/20 135/81  03/22/20 (!) 152/100     Wt Readings from Last 3 Encounters:  06/12/20 230 lb (104.3 kg)  03/22/20 230 lb (104.3 kg)  03/22/20 218 lb 4.1 oz (99 kg)     Physical Exam Pt is alert and oriented PERRL  EOMI HEART IS RRR no murmur or rub LCTA no wheezing or rales MUSCULOSKELETAL reveals some paraspinous muscle tenderness but no overt trigger points.  She ambulates with a antalgic gait and does have difficulty going from seated to standing without significant pain.  She has a positive straight leg raise on the left side negative on the right which is somewhat equivocal.  Her muscle tone and bulk is at baseline.  Her strength appears to be well preserved.  Labs  No results found for: HGBA1C Lab Results  Component Value Date   CREATININE 1.18 (H) 03/15/2020    -------------------------------------------------------------------------------------------------------------------- Lab Results  Component Value Date   WBC 5.3 03/15/2020   HGB 14.9 03/15/2020   HCT 45.9 03/15/2020   PLT 177 03/15/2020   GLUCOSE 115 (H) 03/15/2020   AST 49 (H) 10/28/2017   NA 136 03/15/2020   K 3.4 (L) 03/15/2020   CL 103 03/15/2020   CREATININE 1.18 (H) 03/15/2020   BUN 11 03/15/2020   CO2 22 03/15/2020    --------------------------------------------------------------------------------------------------------------------- DG PAIN CLINIC C-ARM 1-60 MIN NO REPORT  Result Date: 06/12/2020 Fluoro was used, but no Radiologist interpretation will be provided. Please refer to "NOTES" tab for provider progress note.    Assessment & Plan:   [name] was seen today for back pain.  Diagnoses and all orders for this visit:  Cervicalgia  Lumbar spondylosis -     Lumbar Epidural Injection  DDD (degenerative disc disease), cervical -     Lumbar Epidural Injection  Chronic bilateral low back pain with bilateral sciatica -     Lumbar Epidural Injection  Spinal stenosis of lumbar region with neurogenic claudication -     Lumbar Epidural Injection  Chronic pain syndrome  Long term current use of opiate analgesic  Facet arthritis of lumbar region  Weakness of both lower extremities  Other orders -      triamcinolone acetonide (KENALOG-40) injection 40 mg -     sodium chloride flush (NS) 0.9 % injection 10 mL -     ropivacaine (PF) 2 mg/mL (0.2%) (NAROPIN) injection 10 mL -     lidocaine (PF) (XYLOCAINE) 1 % injection 5 mL -     iohexol (OMNIPAQUE) 180 MG/ML injection 10 mL        ----------------------------------------------------------------------------------------------------------------------  Problem List Items Addressed This Visit      Unprioritized   Cervicalgia - Primary   Chronic low back pain (Chronic)   Relevant Medications   baclofen (LIORESAL) 10 MG tablet   Chronic pain syndrome   Relevant Medications   baclofen (LIORESAL) 10 MG tablet   buPROPion (WELLBUTRIN SR) 150 MG 12 hr tablet   DDD (degenerative disc disease), cervical   Relevant Medications  baclofen (LIORESAL) 10 MG tablet   Long term current use of opiate analgesic   Lumbar spondylosis   Relevant Medications   baclofen (LIORESAL) 10 MG tablet    Other Visit Diagnoses    Spinal stenosis of lumbar region with neurogenic claudication       Relevant Medications   baclofen (LIORESAL) 10 MG tablet   buPROPion (WELLBUTRIN SR) 150 MG 12 hr tablet   triamcinolone acetonide (KENALOG-40) injection 40 mg (Completed)   ropivacaine (PF) 2 mg/mL (0.2%) (NAROPIN) injection 10 mL (Completed)   lidocaine (PF) (XYLOCAINE) 1 % injection 5 mL (Completed)   Facet arthritis of lumbar region       Relevant Medications   baclofen (LIORESAL) 10 MG tablet   triamcinolone acetonide (KENALOG-40) injection 40 mg (Completed)   Weakness of both lower extremities            ----------------------------------------------------------------------------------------------------------------------  1. Lumbar spondylosis Continue core stretching strengthening exercises as reviewed - Lumbar Epidural Injection  2. DDD (degenerative disc disease), cervical As above with exercises as reviewed - Lumbar Epidural Injection  3.  Chronic bilateral low back pain with bilateral sciatica We will proceed with a repeat epidural today to see if we can help with some of the persistent pain and sciatica symptoms.  We gone over the risks and benefits of the procedure with her in full detail.  She has responded favorably to these in the past.  We will have her return to clinic approxi-1 to 2 months for possible repeat injection. - Lumbar Epidural Injection  4. Spinal stenosis of lumbar region with neurogenic claudication As above - Lumbar Epidural Injection  5. Cervicalgia As above  6. Chronic pain syndrome She is scheduled for a repeat urine screen.  We will initiate opioid therapy based on that finding.  7. Long term current use of opiate analgesic As above  8. Facet arthritis of lumbar region As above and continue efforts at core strengthening and weight loss  9. Weakness of both lower extremities     ----------------------------------------------------------------------------------------------------------------------  I am having Haset R. Porcher maintain her ibuprofen, [place] TENS/EMS Combo Unit, ProAir HFA, montelukast, sertraline, fluticasone, sertraline, Symbicort, pregabalin, oxyCODONE, HYDROcodone-acetaminophen, baclofen, benzonatate, buPROPion, and prednisoLONE acetate. We administered triamcinolone acetonide, sodium chloride flush, ropivacaine (PF) 2 mg/mL (0.2%), lidocaine (PF), and iohexol. We will continue to administer ropivacaine (PF) 2 mg/mL (0.2%) and lidocaine (PF).   Meds ordered this encounter  Medications  . triamcinolone acetonide (KENALOG-40) injection 40 mg  . sodium chloride flush (NS) 0.9 % injection 10 mL  . ropivacaine (PF) 2 mg/mL (0.2%) (NAROPIN) injection 10 mL  . lidocaine (PF) (XYLOCAINE) 1 % injection 5 mL  . iohexol (OMNIPAQUE) 180 MG/ML injection 10 mL   Patient's Medications  New Prescriptions   No medications on file  Previous Medications   BACLOFEN (LIORESAL) 10 MG  TABLET    Take 10 mg by mouth 3 (three) times daily.   BENZONATATE (TESSALON) 200 MG CAPSULE       BUPROPION (WELLBUTRIN SR) 150 MG 12 HR TABLET       FLUTICASONE (FLONASE) 50 MCG/ACT NASAL SPRAY    Place into the nose.   HYDROCODONE-ACETAMINOPHEN (NORCO) 7.5-325 MG TABLET    Take 1 tablet by mouth every 6 (six) hours as needed for moderate pain or severe pain.   IBUPROFEN (ADVIL,MOTRIN) 200 MG TABLET    Take 200 mg by mouth every 6 (six) hours as needed.   MONTELUKAST (SINGULAIR) 10 MG TABLET  Take 10 mg by mouth at bedtime.   NERVE STIMULATOR (ZEWA TENS/EMS COMBO UNIT) DEVI    by Does not apply route.   OXYCODONE (ROXICODONE) 5 MG IMMEDIATE RELEASE TABLET    Take 1 tablet (5 mg total) by mouth every 4 (four) hours as needed for severe pain.   PREDNISOLONE ACETATE (PRED FORTE) 1 % OPHTHALMIC SUSPENSION    SHAKE LIQUID AND INSTILL 1 DROP IN LEFT EYE THREE TIMES DAILY   PREGABALIN (LYRICA) 75 MG CAPSULE    Take 2 capsules (150 mg total) by mouth 2 (two) times daily.   PROAIR HFA 108 (90 BASE) MCG/ACT INHALER    Inhale 2 puffs into the lungs as needed.   SERTRALINE (ZOLOFT) 100 MG TABLET    Take 200 mg by mouth at bedtime.    SERTRALINE (ZOLOFT) 50 MG TABLET    Take 200 mg by mouth daily.    SYMBICORT 80-4.5 MCG/ACT INHALER    Inhale 2 puffs into the lungs 2 (two) times daily.  Modified Medications   No medications on file  Discontinued Medications   No medications on file   ----------------------------------------------------------------------------------------------------------------------  Follow-up: Return in about 1 month (around 07/12/2020) for evaluation, procedure.   Procedure: L5-S1 LESI with fluoroscopic guidance and no moderate sedation  NOTE: The risks, benefits, and expectations of the procedure have been discussed and explained to the patient who was understanding and in agreement with suggested treatment plan. No guarantees were made.  DESCRIPTION OF PROCEDURE: Lumbar  epidural steroid injection with no IV Versed, EKG, blood pressure, pulse, and pulse oximetry monitoring. The procedure was performed with the patient in the prone position under fluoroscopic guidance.  Sterile prep x3 was initiated and I then injected subcutaneous lidocaine to the overlying L5-S1 site after its fluoroscopic identifictation.  Using strict aseptic technique, I then advanced an 18-gauge Tuohy epidural needle in the midline using interlaminar approach via loss-of-resistance to saline technique. There was negative aspiration for heme or  CSF.  I then confirmed position with both AP and Lateral fluoroscan.  2 cc of contrast dye were injected and a  total of 5 mL of Preservative-Free normal saline mixed with 40 mg of Kenalog and 1cc Ropicaine 0.2 percent were injected incrementally via the  epidurally placed needle. The needle was removed. The patient tolerated the injection well and was convalesced and discharged to home in stable condition. Should the patient have any post procedure difficulty they have been instructed on how to contact us for assistance.    Molli Barrows, MD

## 2020-06-12 NOTE — Telephone Encounter (Signed)
She had an LESI today which took over 2 months and an appeal to get authorization. She will have to come in for a post procedure eval, for you to document that she got at least 50 % relief from this one and be sure to put in her pain score and home exercise program.

## 2020-06-13 ENCOUNTER — Telehealth: Payer: Self-pay | Admitting: *Deleted

## 2020-06-13 NOTE — Telephone Encounter (Signed)
No problems post procedure. 

## 2020-06-14 ENCOUNTER — Telehealth: Payer: Medicaid Other | Admitting: Anesthesiology

## 2020-07-03 ENCOUNTER — Encounter: Payer: Self-pay | Admitting: Anesthesiology

## 2020-07-03 ENCOUNTER — Other Ambulatory Visit: Payer: Self-pay

## 2020-07-03 ENCOUNTER — Ambulatory Visit: Payer: Medicaid Other | Attending: Anesthesiology | Admitting: Anesthesiology

## 2020-07-03 VITALS — BP 149/92 | HR 74 | Temp 97.0°F | Resp 16 | Ht 65.0 in | Wt 233.0 lb

## 2020-07-03 DIAGNOSIS — R29898 Other symptoms and signs involving the musculoskeletal system: Secondary | ICD-10-CM | POA: Diagnosis present

## 2020-07-03 DIAGNOSIS — G8929 Other chronic pain: Secondary | ICD-10-CM | POA: Diagnosis present

## 2020-07-03 DIAGNOSIS — Z79899 Other long term (current) drug therapy: Secondary | ICD-10-CM | POA: Insufficient documentation

## 2020-07-03 DIAGNOSIS — G894 Chronic pain syndrome: Secondary | ICD-10-CM | POA: Diagnosis present

## 2020-07-03 DIAGNOSIS — M5441 Lumbago with sciatica, right side: Secondary | ICD-10-CM | POA: Diagnosis present

## 2020-07-03 DIAGNOSIS — R531 Weakness: Secondary | ICD-10-CM | POA: Insufficient documentation

## 2020-07-03 DIAGNOSIS — M542 Cervicalgia: Secondary | ICD-10-CM | POA: Diagnosis present

## 2020-07-03 DIAGNOSIS — M47816 Spondylosis without myelopathy or radiculopathy, lumbar region: Secondary | ICD-10-CM | POA: Insufficient documentation

## 2020-07-03 DIAGNOSIS — M48062 Spinal stenosis, lumbar region with neurogenic claudication: Secondary | ICD-10-CM | POA: Insufficient documentation

## 2020-07-03 DIAGNOSIS — M5030 Other cervical disc degeneration, unspecified cervical region: Secondary | ICD-10-CM | POA: Diagnosis present

## 2020-07-03 DIAGNOSIS — M5442 Lumbago with sciatica, left side: Secondary | ICD-10-CM | POA: Diagnosis present

## 2020-07-03 DIAGNOSIS — M545 Low back pain, unspecified: Secondary | ICD-10-CM | POA: Diagnosis present

## 2020-07-03 DIAGNOSIS — Z79891 Long term (current) use of opiate analgesic: Secondary | ICD-10-CM | POA: Diagnosis present

## 2020-07-03 MED ORDER — OXYCODONE HCL 5 MG PO TABS: 5.0000 mg | ORAL_TABLET | ORAL | 0 refills | Status: DC | PRN

## 2020-07-03 MED ORDER — BACLOFEN 10 MG PO TABS: 10.0000 mg | ORAL_TABLET | Freq: Two times a day (BID) | ORAL | 3 refills | Status: AC

## 2020-07-03 MED ORDER — PREGABALIN 75 MG PO CAPS: 150.0000 mg | ORAL_CAPSULE | Freq: Two times a day (BID) | ORAL | 3 refills | Status: DC

## 2020-07-03 MED ORDER — HYDROCODONE-ACETAMINOPHEN 7.5-325 MG PO TABS: 1.0000 | ORAL_TABLET | ORAL | 0 refills | Status: DC | PRN

## 2020-07-03 NOTE — Patient Instructions (Signed)

## 2020-07-03 NOTE — Progress Notes (Signed)
Safety precautions to be maintained throughout the outpatient stay will include: orient to surroundings, keep bed in low position, maintain call bell within reach at all times, provide assistance with transfer out of bed and ambulation.  

## 2020-07-04 NOTE — Progress Notes (Signed)
Subjective:  Patient ID: Hannah Maynard, female    DOB: Feb 08, 1966  Age: 55 y.o. MRN: [id number]  CC: Back Pain (lower)   Procedure: None  HPI Nicholl R Schaaf presents for reevaluation.  She was last seen a few months ago and had an epidural at her last visit.  This helped considerably with her lower extremity sciatica symptoms but she is having a considerable amount of persistent low back pain with radiation into both hips buttocks and upper legs.  She has had this in the past and feels that it has been worse recently.  She has been more active and has been taking care of one of her relatives.  There is a lot of lifting involved with that.  She notes the pain is worse with prolonged standing and rotational movement.  She had a previous MRI that showed evidence of facet arthropathy in addition to degenerative disc pathology.  She generally gets better relief with the epidurals then per her last injection.  She also notices that the pain medications that she is been taking are still effective but working for a shorter period of time and she is having more breakthrough pain during the day.  She has no side effects with the medications.  No complaints of lower extremity strength dysfunction or bowel or bladder dysfunction are noted.  Outpatient Medications Prior to Visit  Medication Sig Dispense Refill  . buPROPion (WELLBUTRIN SR) 150 MG 12 hr tablet     . ibuprofen (ADVIL,MOTRIN) 200 MG tablet Take 200 mg by mouth every 6 (six) hours as needed.    . Nerve Stimulator (ZEWA TENS/EMS COMBO UNIT) DEVI by Does not apply route.    . prednisoLONE acetate (PRED FORTE) 1 % ophthalmic suspension SHAKE LIQUID AND INSTILL 1 DROP IN LEFT EYE THREE TIMES DAILY    . PROAIR HFA 108 (90 Base) MCG/ACT inhaler Inhale 2 puffs into the lungs as needed.    . sertraline (ZOLOFT) 100 MG tablet Take 200 mg by mouth at bedtime.     . SYMBICORT 80-4.5 MCG/ACT inhaler Inhale 2 puffs into the lungs 2 (two) times daily.     . baclofen (LIORESAL) 10 MG tablet Take 10 mg by mouth 3 (three) times daily.    . benzonatate (TESSALON) 200 MG capsule  (Patient not taking: Reported on 07/03/2020)    . fluticasone (FLONASE) 50 MCG/ACT nasal spray Place into the nose.    . montelukast (SINGULAIR) 10 MG tablet Take 10 mg by mouth at bedtime.    . sertraline (ZOLOFT) 50 MG tablet Take 200 mg by mouth daily.  (Patient not taking: Reported on 08/23/2019)    . pregabalin (LYRICA) 75 MG capsule Take 2 capsules (150 mg total) by mouth 2 (two) times daily. 120 capsule 3   Facility-Administered Medications Prior to Visit  Medication Dose Route Frequency Provider Last Rate Last Admin  . lidocaine (PF) (XYLOCAINE) 1 % injection 5 mL  5 mL Subcutaneous Once Molli Barrows, MD      . ropivacaine (PF) 2 mg/mL (0.2%) (NAROPIN) injection 10 mL  10 mL Epidural Once Molli Barrows, MD        Review of Systems CNS: No confusion or sedation Cardiac: No angina or palpitations GI: No abdominal pain or constipation Constitutional: No nausea vomiting fevers or chills  Objective:  BP (!) 149/92   Pulse 74   Temp (!) 97 F (36.1 C) (Temporal)   Resp 16   Ht 5\' 5"  (1.651 m)  Wt 233 lb (105.7 kg)   LMP  (LMP Unknown)   SpO2 100%   BMI 38.77 kg/m    BP Readings from Last 3 Encounters:  07/03/20 (!) 149/92  06/12/20 (!) 174/96  03/22/20 135/81     Wt Readings from Last 3 Encounters:  07/03/20 233 lb (105.7 kg)  06/12/20 230 lb (104.3 kg)  03/22/20 230 lb (104.3 kg)     Physical Exam Pt is alert and oriented PERRL EOMI HEART IS RRR no murmur or rub LCTA no wheezing or rales MUSCULOSKELETAL reveals some difficulty going from seated to standing which appears to be secondary to pain.  She walks with an antalgic gait.  She does have pain on extension and this is severe with right lateral rotation and modest with left lateral rotation.  This does reproduce her primary pain complaint.  This is with extension at the low back.   Flexion does not provoke pain.  She has some paraspinous muscle tenderness in the lumbar region but no overt trigger points are noted.  Her muscle tone and bulk to the lower extremities is per baseline  Labs  No results found for: HGBA1C Lab Results  Component Value Date   CREATININE 1.18 (H) 03/15/2020    -------------------------------------------------------------------------------------------------------------------- Lab Results  Component Value Date   WBC 5.3 03/15/2020   HGB 14.9 03/15/2020   HCT 45.9 03/15/2020   PLT 177 03/15/2020   GLUCOSE 115 (H) 03/15/2020   AST 49 (H) 10/28/2017   NA 136 03/15/2020   K 3.4 (L) 03/15/2020   CL 103 03/15/2020   CREATININE 1.18 (H) 03/15/2020   BUN 11 03/15/2020   CO2 22 03/15/2020    --------------------------------------------------------------------------------------------------------------------- DG PAIN CLINIC C-ARM 1-60 MIN NO REPORT  Result Date: 06/12/2020 Fluoro was used, but no Radiologist interpretation will be provided. Please refer to "NOTES" tab for provider progress note.    Assessment & Plan:   [name] was seen today for back pain.  Diagnoses and all orders for this visit:  Lumbar spondylosis -     LUMBAR FACET(MEDIAL BRANCH NERVE BLOCK) MBNB; Future  DDD (degenerative disc disease), cervical  Chronic bilateral low back pain with bilateral sciatica  Spinal stenosis of lumbar region with neurogenic claudication  Cervicalgia  Chronic pain syndrome  Long term current use of opiate analgesic  Facet arthritis of lumbar region -     LUMBAR FACET(MEDIAL BRANCH NERVE BLOCK) MBNB; Future  Weakness of both lower extremities  Chronic bilateral low back pain without sciatica  Other orders -     Discontinue: HYDROcodone-acetaminophen (NORCO) 7.5-325 MG tablet; Take 1 tablet by mouth every 4 (four) hours as needed for moderate pain or severe pain. -     baclofen (LIORESAL) 10 MG tablet; Take 1 tablet (10 mg  total) by mouth 2 (two) times daily. -     pregabalin (LYRICA) 75 MG capsule; Take 2 capsules (150 mg total) by mouth 2 (two) times daily. -     oxyCODONE (ROXICODONE) 5 MG immediate release tablet; Take 1 tablet (5 mg total) by mouth every 4 (four) hours as needed for severe pain.        ----------------------------------------------------------------------------------------------------------------------  Problem List Items Addressed This Visit      Unprioritized   Bilateral low back pain with bilateral sciatica   Relevant Medications   baclofen (LIORESAL) 10 MG tablet   pregabalin (LYRICA) 75 MG capsule   oxyCODONE (ROXICODONE) 5 MG immediate release tablet   Cervicalgia   Chronic low back pain (  Chronic)   Relevant Medications   baclofen (LIORESAL) 10 MG tablet   oxyCODONE (ROXICODONE) 5 MG immediate release tablet   Chronic pain syndrome   Relevant Medications   baclofen (LIORESAL) 10 MG tablet   pregabalin (LYRICA) 75 MG capsule   oxyCODONE (ROXICODONE) 5 MG immediate release tablet   DDD (degenerative disc disease), cervical   Relevant Medications   baclofen (LIORESAL) 10 MG tablet   oxyCODONE (ROXICODONE) 5 MG immediate release tablet   Long term current use of opiate analgesic   Lumbar spondylosis - Primary   Relevant Medications   baclofen (LIORESAL) 10 MG tablet   oxyCODONE (ROXICODONE) 5 MG immediate release tablet   Other Relevant Orders   LUMBAR FACET(MEDIAL BRANCH NERVE BLOCK) MBNB    Other Visit Diagnoses    Spinal stenosis of lumbar region with neurogenic claudication       Relevant Medications   baclofen (LIORESAL) 10 MG tablet   pregabalin (LYRICA) 75 MG capsule   oxyCODONE (ROXICODONE) 5 MG immediate release tablet   Facet arthritis of lumbar region       Relevant Medications   baclofen (LIORESAL) 10 MG tablet   oxyCODONE (ROXICODONE) 5 MG immediate release tablet   Other Relevant Orders   LUMBAR FACET(MEDIAL BRANCH NERVE BLOCK) MBNB   Weakness of  both lower extremities            ----------------------------------------------------------------------------------------------------------------------  1. Lumbar spondylosis Continue core stretching strengthening exercises.  We will also refill her baclofen for the muscle spasms in the lower back - LUMBAR FACET(MEDIAL BRANCH NERVE BLOCK) MBNB; Future  2. DDD (degenerative disc disease), cervical As above  3. Chronic bilateral low back pain with bilateral sciatica As above  4. Spinal stenosis of lumbar region with neurogenic claudication As above and efforts at weight loss  5. Cervicalgia Exercises as reviewed  6. Chronic pain syndrome I have reviewed the [facility] practitioner database information is appropriate for refill today.  This will be for her oxycodone with a refill for today's date.  She has a urine screen that is pending.  7. Long term current use of opiate analgesic As above  8. Facet arthritis of lumbar region Based on clinical findings and review of her previous MRI I think she would not be an appropriate candidate for diagnostic lumbar bilateral facet block.  We talked about this and the patient has requested it.  I gone over the risks and benefits of the procedure with her in full detail and all questions are answered.  I think she would be an appropriate candidate.  Hopefully this will help with some of the persistent lower back pain she is experiencing.  The sciatica symptoms seem to have improved following her epidurals previously. - LUMBAR FACET(MEDIAL BRANCH NERVE BLOCK) MBNB; Future  9. Weakness of both lower extremities Chronic but stable and appears to be giveaway weakness associated  10. Chronic bilateral low back pain without sciatica As above    ----------------------------------------------------------------------------------------------------------------------  I have discontinued Prosperity R. Stcharles's HYDROcodone-acetaminophen. I have  also changed her baclofen. Additionally, I am having her start on oxyCODONE. Lastly, I am having her maintain her ibuprofen, [place] TENS/EMS Combo Unit, ProAir HFA, montelukast, sertraline, fluticasone, sertraline, Symbicort, benzonatate, buPROPion, prednisoLONE acetate, and pregabalin. We will continue to administer ropivacaine (PF) 2 mg/mL (0.2%) and lidocaine (PF).   Meds ordered this encounter  Medications  . DISCONTD: HYDROcodone-acetaminophen (NORCO) 7.5-325 MG tablet    Sig: Take 1 tablet by mouth every 4 (four) hours as  needed for moderate pain or severe pain.    Dispense:  150 tablet    Refill:  0  . baclofen (LIORESAL) 10 MG tablet    Sig: Take 1 tablet (10 mg total) by mouth 2 (two) times daily.    Dispense:  60 each    Refill:  3  . pregabalin (LYRICA) 75 MG capsule    Sig: Take 2 capsules (150 mg total) by mouth 2 (two) times daily.    Dispense:  120 capsule    Refill:  3  . oxyCODONE (ROXICODONE) 5 MG immediate release tablet    Sig: Take 1 tablet (5 mg total) by mouth every 4 (four) hours as needed for severe pain.    Dispense:  150 tablet    Refill:  0   Patient's Medications  New Prescriptions   OXYCODONE (ROXICODONE) 5 MG IMMEDIATE RELEASE TABLET    Take 1 tablet (5 mg total) by mouth every 4 (four) hours as needed for severe pain.  Previous Medications   BENZONATATE (TESSALON) 200 MG CAPSULE       BUPROPION (WELLBUTRIN SR) 150 MG 12 HR TABLET       FLUTICASONE (FLONASE) 50 MCG/ACT NASAL SPRAY    Place into the nose.   IBUPROFEN (ADVIL,MOTRIN) 200 MG TABLET    Take 200 mg by mouth every 6 (six) hours as needed.   MONTELUKAST (SINGULAIR) 10 MG TABLET    Take 10 mg by mouth at bedtime.   NERVE STIMULATOR (ZEWA TENS/EMS COMBO UNIT) DEVI    by Does not apply route.   PREDNISOLONE ACETATE (PRED FORTE) 1 % OPHTHALMIC SUSPENSION    SHAKE LIQUID AND INSTILL 1 DROP IN LEFT EYE THREE TIMES DAILY   PROAIR HFA 108 (90 BASE) MCG/ACT INHALER    Inhale 2 puffs into the lungs as  needed.   SERTRALINE (ZOLOFT) 100 MG TABLET    Take 200 mg by mouth at bedtime.    SERTRALINE (ZOLOFT) 50 MG TABLET    Take 200 mg by mouth daily.    SYMBICORT 80-4.5 MCG/ACT INHALER    Inhale 2 puffs into the lungs 2 (two) times daily.  Modified Medications   Modified Medication Previous Medication   BACLOFEN (LIORESAL) 10 MG TABLET baclofen (LIORESAL) 10 MG tablet      Take 1 tablet (10 mg total) by mouth 2 (two) times daily.    Take 10 mg by mouth 3 (three) times daily.   PREGABALIN (LYRICA) 75 MG CAPSULE pregabalin (LYRICA) 75 MG capsule      Take 2 capsules (150 mg total) by mouth 2 (two) times daily.    Take 2 capsules (150 mg total) by mouth 2 (two) times daily.  Discontinued Medications   No medications on file   ----------------------------------------------------------------------------------------------------------------------  Follow-up: Return in about 1 month (around 08/03/2020) for procedure, evaluation.    Molli Barrows, MD

## 2020-07-27 ENCOUNTER — Ambulatory Visit: Payer: Medicaid Other | Attending: Anesthesiology | Admitting: Anesthesiology

## 2020-07-27 ENCOUNTER — Other Ambulatory Visit: Payer: Self-pay

## 2020-07-27 DIAGNOSIS — M48062 Spinal stenosis, lumbar region with neurogenic claudication: Secondary | ICD-10-CM

## 2020-07-27 DIAGNOSIS — M545 Low back pain, unspecified: Secondary | ICD-10-CM

## 2020-07-27 DIAGNOSIS — M542 Cervicalgia: Secondary | ICD-10-CM

## 2020-07-27 DIAGNOSIS — M5030 Other cervical disc degeneration, unspecified cervical region: Secondary | ICD-10-CM | POA: Diagnosis not present

## 2020-07-27 DIAGNOSIS — M5442 Lumbago with sciatica, left side: Secondary | ICD-10-CM

## 2020-07-27 DIAGNOSIS — G8929 Other chronic pain: Secondary | ICD-10-CM

## 2020-07-27 DIAGNOSIS — M47816 Spondylosis without myelopathy or radiculopathy, lumbar region: Secondary | ICD-10-CM

## 2020-07-27 DIAGNOSIS — M5441 Lumbago with sciatica, right side: Secondary | ICD-10-CM

## 2020-07-27 DIAGNOSIS — G894 Chronic pain syndrome: Secondary | ICD-10-CM

## 2020-07-28 MED ORDER — PREGABALIN 75 MG PO CAPS: 150.0000 mg | ORAL_CAPSULE | Freq: Two times a day (BID) | ORAL | 3 refills | Status: DC

## 2020-07-28 MED ORDER — OXYCODONE HCL 5 MG PO TABS: 5.0000 mg | ORAL_TABLET | ORAL | 0 refills | Status: DC | PRN

## 2020-07-28 MED ORDER — OXYCODONE HCL 5 MG PO TABS: 5.0000 mg | ORAL_TABLET | ORAL | 0 refills | Status: AC | PRN

## 2020-07-28 NOTE — Progress Notes (Signed)
Virtual Visit via Video Note  I connected with Hannah Maynard on 07/28/20 at  1:45 PM EDT by a video enabled telemedicine application and verified that I am speaking with the correct person using two identifiers.  Location: Patient: Home Provider: Pain control center   I discussed the limitations of evaluation and management by telemedicine and the availability of in person appointments. The patient expressed understanding and agreed to proceed.  History of Present Illness: I was able to speak with Hannah Maynard regarding her low back pain today.  We are able to do this via telephone as we were unable to link for the video portion of the conference.  She is continue to have a fair amount of back pain of the same quality characteristic and distribution.  She has tried exercises including stretching strengthening and previous injection therapy.  Her most recent epidural at the L5-S1 level gave her significant improvement but this is been shorter lived than one of her previous epidurals as reviewed today.  Otherwise she is in her usual state of health no new changes in lower extremity strength or function but a considerable amount of back pain with radiation to the both calves and buttocks.  Despite efforts at stretching strengthening and weight loss she continues to have pain.  She takes her medications and these continue to give her good relief but she has frequent breakthrough pain.  She would like to present for repeat injection and is scheduled for a facet block here in the near future.  This was previously reviewed with her.   Observations/Objective:  Current Outpatient Medications:  .  baclofen (LIORESAL) 10 MG tablet, Take 1 tablet (10 mg total) by mouth 2 (two) times daily., Disp: 60 each, Rfl: 3 .  benzonatate (TESSALON) 200 MG capsule, , Disp: , Rfl:  .  buPROPion (WELLBUTRIN SR) 150 MG 12 hr tablet, , Disp: , Rfl:  .  fluticasone (FLONASE) 50 MCG/ACT nasal spray, Place into the nose.,  Disp: , Rfl:  .  ibuprofen (ADVIL,MOTRIN) 200 MG tablet, Take 200 mg by mouth every 6 (six) hours as needed., Disp: , Rfl:  .  montelukast (SINGULAIR) 10 MG tablet, Take 10 mg by mouth at bedtime., Disp: , Rfl:  .  Nerve Stimulator (ZEWA TENS/EMS COMBO UNIT) DEVI, by Does not apply route., Disp: , Rfl:  .  oxyCODONE (ROXICODONE) 5 MG immediate release tablet, Take 1 tablet (5 mg total) by mouth every 4 (four) hours as needed for severe pain., Disp: 150 tablet, Rfl: 0 .  prednisoLONE acetate (PRED FORTE) 1 % ophthalmic suspension, SHAKE LIQUID AND INSTILL 1 DROP IN LEFT EYE THREE TIMES DAILY, Disp: , Rfl:  .  pregabalin (LYRICA) 75 MG capsule, Take 2 capsules (150 mg total) by mouth 2 (two) times daily., Disp: 120 capsule, Rfl: 3 .  PROAIR HFA 108 (90 Base) MCG/ACT inhaler, Inhale 2 puffs into the lungs as needed., Disp: , Rfl:  .  sertraline (ZOLOFT) 100 MG tablet, Take 200 mg by mouth at bedtime. , Disp: , Rfl:  .  sertraline (ZOLOFT) 50 MG tablet, Take 200 mg by mouth daily.  (Patient not taking: Reported on 08/23/2019), Disp: , Rfl:  .  SYMBICORT 80-4.5 MCG/ACT inhaler, Inhale 2 puffs into the lungs 2 (two) times daily., Disp: , Rfl:   Current Facility-Administered Medications:  .  lidocaine (PF) (XYLOCAINE) 1 % injection 5 mL, 5 mL, Subcutaneous, Once, Molli Barrows, MD .  ropivacaine (PF) 2 mg/mL (0.2%) (NAROPIN) injection 10  mL, 10 mL, Epidural, Once, Andree Elk Alvina Filbert, MD  Assessment and Plan: 1. Lumbar spondylosis   2. DDD (degenerative disc disease), cervical   3. Chronic bilateral low back pain with bilateral sciatica   4. Spinal stenosis of lumbar region with neurogenic claudication   5. Cervicalgia   6. Chronic pain syndrome   7. Facet arthritis of lumbar region   8. Chronic bilateral low back pain without sciatica   9. Chronic neck pain   As discussed with her today I think she is good candidate for a interventional injection and facet block.  We will plan on doing this  bilaterally at her next follow-up appointment.  I want her to continue efforts at stretching strengthening and we will continue with her current medication management with no changes made.  She will stay on her Lyrica and muscle relaxants as reviewed today.  We have gone over the risks and benefits of interventional therapy.  I do not feel she warrants further investigational studies at this time.  Continue follow-up with her primary care physicians for baseline medical care.  I have reviewed the [facility] practitioner database information and it is appropriate for refill.  She has a urine screen pending   Follow Up Instructions:    I discussed the assessment and treatment plan with the patient. The patient was provided an opportunity to ask questions and all were answered. The patient agreed with the plan and demonstrated an understanding of the instructions.   The patient was advised to call back or seek an in-person evaluation if the symptoms worsen or if the condition fails to improve as anticipated.  I provided 30 minutes of non-face-to-face time during this encounter.   Molli Barrows, MD

## 2020-08-23 ENCOUNTER — Ambulatory Visit (HOSPITAL_BASED_OUTPATIENT_CLINIC_OR_DEPARTMENT_OTHER): Payer: Medicaid Other | Admitting: Student in an Organized Health Care Education/Training Program

## 2020-08-23 ENCOUNTER — Ambulatory Visit
Admission: RE | Admit: 2020-08-23 | Discharge: 2020-08-23 | Disposition: A | Discharge status: Home / Self Care | Payer: Medicaid Other | Source: Ambulatory Visit | Attending: Student in an Organized Health Care Education/Training Program | Admitting: Student in an Organized Health Care Education/Training Program

## 2020-08-23 ENCOUNTER — Other Ambulatory Visit: Payer: Self-pay

## 2020-08-23 VITALS — BP 145/87 | HR 81 | Temp 97.7°F | Resp 20 | Ht 65.5 in | Wt 233.0 lb

## 2020-08-23 DIAGNOSIS — G894 Chronic pain syndrome: Secondary | ICD-10-CM

## 2020-08-23 DIAGNOSIS — M47816 Spondylosis without myelopathy or radiculopathy, lumbar region: Secondary | ICD-10-CM

## 2020-08-23 DIAGNOSIS — Z79891 Long term (current) use of opiate analgesic: Secondary | ICD-10-CM | POA: Insufficient documentation

## 2020-08-23 MED ORDER — ROPIVACAINE HCL 2 MG/ML IJ SOLN
INTRAMUSCULAR | Status: AC
  Filled 2020-08-23: qty 20

## 2020-08-23 MED ORDER — DEXAMETHASONE SODIUM PHOSPHATE 10 MG/ML IJ SOLN
10.0000 mg | Freq: Once | INTRAMUSCULAR | Status: DC
  Filled 2020-08-23: qty 1

## 2020-08-23 MED ORDER — TRIAMCINOLONE ACETONIDE 40 MG/ML IJ SUSP
40.0000 mg | Freq: Once | INTRAMUSCULAR | Status: AC
Start: 2020-08-23 — End: 2020-08-23
  Administered 2020-08-23: 40 mg

## 2020-08-23 MED ORDER — TRIAMCINOLONE ACETONIDE 40 MG/ML IJ SUSP
40.0000 mg | Freq: Once | INTRAMUSCULAR | Status: AC
  Administered 2020-08-23: 40 mg

## 2020-08-23 MED ORDER — ROPIVACAINE HCL 2 MG/ML IJ SOLN
9.0000 mL | Freq: Once | INTRAMUSCULAR | Status: AC
  Administered 2020-08-23: 9 mL via PERINEURAL

## 2020-08-23 MED ORDER — LIDOCAINE HCL 2 % IJ SOLN
20.0000 mL | Freq: Once | INTRAMUSCULAR | Status: AC
  Administered 2020-08-23: 200 mg
  Filled 2020-08-23: qty 10

## 2020-08-23 NOTE — Progress Notes (Signed)
PROVIDER NOTE: Information contained herein reflects review and annotations entered in association with encounter. Interpretation of such information and data should be left to medically-trained personnel. Information provided to patient can be located elsewhere in the medical record under "Patient Instructions". Document created using STT-dictation technology, any transcriptional errors that may result from process are unintentional.    Patient: Hannah Maynard  Service Category: Procedure  Provider: Gillis Santa, MD  DOB: 05-Jul-1965  DOS: 08/23/2020  Location: [facility]  MRN: [id number]  Setting: Ambulatory - outpatient  Referring Provider: [place] *  Type: Established Patient  Specialty: Interventional Pain Management  PCP: [place]   Primary Reason for Visit: Interventional Pain Management Treatment. CC: Back Pain (Low and bilateral)  Procedure:          Anesthesia, Analgesia, Anxiolysis:  Type: Lumbar Facet, Medial Branch Block(s)        #1  Primary Purpose: Diagnostic Region: Posterolateral Lumbosacral Spine Level: L3, L4, L5, Medial Branch Level(s). Injecting these levels blocks the L3-4, L4-5, lumbar facet joints. Laterality: Bilateral  Type: Local Anesthesia  Local Anesthetic: Lidocaine 1-2%  Position: Prone   Indications: 1. Lumbar spondylosis   2. Facet arthritis of lumbar region   3. Long term current use of opiate analgesic   4. Chronic pain syndrome    Pain Score: Pre-procedure: 8 /10 Post-procedure: 5 /10   Pre-op H&P Assessment:  Ms. Gerstel is a 55 y.o. (year old), female patient, seen today for interventional treatment. She  has a past surgical history that includes Lipoma excision. Ms. Welte has a current medication list which includes the following prescription(s): benzonatate, bupropion, fluticasone, ibuprofen, montelukast, zewa tens/ems combo unit, oxycodone, [START ON 09/01/2020] oxycodone, prednisolone  acetate, pregabalin, proair hfa, sertraline, sertraline, and symbicort, and the following Facility-Administered Medications: lidocaine (pf), ropivacaine (pf) 2 mg/ml (0.2%), triamcinolone acetonide, and triamcinolone acetonide. Her primarily concern today is the Back Pain (Low and bilateral)  Initial Vital Signs:  Pulse/HCG Rate: 81ECG Heart Rate: 90 Temp: 97.7 F (36.5 C) Resp: 18 BP: 132/82 SpO2: 100 %  BMI: Estimated body mass index is 38.18 kg/m as calculated from the following:   Height as of this encounter: 5' 5.5" (1.664 m).   Weight as of this encounter: 233 lb (105.7 kg).  Risk Assessment: Allergies: Reviewed. She is allergic to duloxetine and penicillins.  Allergy Precautions: None required Coagulopathies: Reviewed. None identified.  Blood-thinner therapy: None at this time Active Infection(s): Reviewed. None identified. Ms. Jago is afebrile  Site Confirmation: Ms. Garlock was asked to confirm the procedure and laterality before marking the site Procedure checklist: Completed Consent: Before the procedure and under the influence of no sedative(s), amnesic(s), or anxiolytics, the patient was informed of the treatment options, risks and possible complications. To fulfill our ethical and legal obligations, as recommended by the American Medical Association's Code of Ethics, I have informed the patient of my clinical impression; the nature and purpose of the treatment or procedure; the risks, benefits, and possible complications of the intervention; the alternatives, including doing nothing; the risk(s) and benefit(s) of the alternative treatment(s) or procedure(s); and the risk(s) and benefit(s) of doing nothing. The patient was provided information about the general risks and possible complications associated with the procedure. These may include, but are not limited to: failure to achieve desired goals, infection, bleeding, organ or nerve damage, allergic reactions, paralysis, and  death. In addition, the patient was informed of those risks and complications associated to [facility]  procedures, such as failure to decrease pain; infection (i.e.: Meningitis, epidural or intraspinal abscess); bleeding (i.e.: epidural hematoma, subarachnoid hemorrhage, or any other type of intraspinal or peri-dural bleeding); organ or nerve damage (i.e.: Any type of peripheral nerve, nerve root, or spinal cord injury) with subsequent damage to sensory, motor, and/or autonomic systems, resulting in permanent pain, numbness, and/or weakness of one or several areas of the body; allergic reactions; (i.e.: anaphylactic reaction); and/or death. Furthermore, the patient was informed of those risks and complications associated with the medications. These include, but are not limited to: allergic reactions (i.e.: anaphylactic or anaphylactoid reaction(s)); adrenal axis suppression; blood sugar elevation that in diabetics may result in ketoacidosis or comma; water retention that in patients with history of congestive heart failure may result in shortness of breath, pulmonary edema, and decompensation with resultant heart failure; weight gain; swelling or edema; medication-induced neural toxicity; particulate matter embolism and blood vessel occlusion with resultant organ, and/or nervous system infarction; and/or aseptic necrosis of one or more joints. Finally, the patient was informed that Medicine is not an exact science; therefore, there is also the possibility of unforeseen or unpredictable risks and/or possible complications that may result in a catastrophic outcome. The patient indicated having understood very clearly. We have given the patient no guarantees and we have made no promises. Enough time was given to the patient to ask questions, all of which were answered to the patient's satisfaction. Ms. Bischoff has indicated that she wanted to continue with the procedure. Attestation: I, the ordering provider,  attest that I have discussed with the patient the benefits, risks, side-effects, alternatives, likelihood of achieving goals, and potential problems during recovery for the procedure that I have provided informed consent. Date  Time: 08/23/2020  2:09 PM  Pre-Procedure Preparation:  Monitoring: As per clinic protocol. Respiration, ETCO2, SpO2, BP, heart rate and rhythm monitor placed and checked for adequate function Safety Precautions: Patient was assessed for positional comfort and pressure points before starting the procedure. Time-out: I initiated and conducted the "Time-out" before starting the procedure, as per protocol. The patient was asked to participate by confirming the accuracy of the "Time Out" information. Verification of the correct person, site, and procedure were performed and confirmed by me, the nursing staff, and the patient. "Time-out" conducted as per Joint Commission's Universal Protocol (UP.01.01.01). Time: 1431  Description of Procedure:          Laterality: Bilateral. The procedure was performed in identical fashion on both sides. Levels:  L3, L4, L5, Medial Branch Level(s) Area Prepped: Posterior Lumbosacral Region DuraPrep (Iodine Povacrylex [0.7% available iodine] and Isopropyl Alcohol, 74% w/w) Safety Precautions: Aspiration looking for blood return was conducted prior to all injections. At no point did we inject any substances, as a needle was being advanced. Before injecting, the patient was told to immediately notify me if she was experiencing any new onset of "ringing in the ears, or metallic taste in the mouth". No attempts were made at seeking any paresthesias. Safe injection practices and needle disposal techniques used. Medications properly checked for expiration dates. SDV (single dose vial) medications used. After the completion of the procedure, all disposable equipment used was discarded in the proper designated medical waste containers. Local Anesthesia:  Protocol guidelines were followed. The patient was positioned over the fluoroscopy table. The area was prepped in the usual manner. The time-out was completed. The target area was identified using fluoroscopy. A 12-in long, straight, sterile hemostat was used with fluoroscopic guidance to locate the  targets for each level blocked. Once located, the skin was marked with an approved surgical skin marker. Once all sites were marked, the skin (epidermis, dermis, and hypodermis), as well as deeper tissues (fat, connective tissue and muscle) were infiltrated with a small amount of a short-acting local anesthetic, loaded on a 10cc syringe with a 25G, 1.5-in  Needle. An appropriate amount of time was allowed for local anesthetics to take effect before proceeding to the next step. Local Anesthetic: Lidocaine 2.0% The unused portion of the local anesthetic was discarded in the proper designated containers. Technical explanation of process:   L3 Medial Branch Nerve Block (MBB): The target area for the L3 medial branch is at the junction of the postero-lateral aspect of the superior articular process and the superior, posterior, and medial edge of the transverse process of L4. Under fluoroscopic guidance, a Quincke needle was inserted until contact was made with os over the superior postero-lateral aspect of the pedicular shadow (target area). After negative aspiration for blood, 38mL of the nerve block solution was injected without difficulty or complication. The needle was removed intact. L4 Medial Branch Nerve Block (MBB): The target area for the L4 medial branch is at the junction of the postero-lateral aspect of the superior articular process and the superior, posterior, and medial edge of the transverse process of L5. Under fluoroscopic guidance, a Quincke needle was inserted until contact was made with os over the superior postero-lateral aspect of the pedicular shadow (target area). After negative aspiration for  blood, 70mL of the nerve block solution was injected without difficulty or complication. The needle was removed intact. L5 Medial Branch Nerve Block (MBB): The target area for the L5 medial branch is at the junction of the postero-lateral aspect of the superior articular process and the superior, posterior, and medial edge of the sacral ala. Under fluoroscopic guidance, a Quincke needle was inserted until contact was made with os over the superior postero-lateral aspect of the pedicular shadow (target area). After negative aspiration for blood, 24mL of the nerve block solution was injected without difficulty or complication. The needle was removed intact.  Nerve block solution: 12 cc solution made of 10 cc of 0.2% ropivacaine, 2 cc of Kennalog 40 mg/cc. 2 cc injected at each level above bilaterally.  Procedural Needles: 22-gauge, 3.5-inch, Quincke needles used for all levels.  Once the entire procedure was completed, the treated area was cleaned, making sure to leave some of the prepping solution back to take advantage of its long term bactericidal properties.      Illustration of the posterior view of the lumbar spine and the posterior neural structures. Laminae of L2 through S1 are labeled. DPRL5, dorsal primary ramus of L5; DPRS1, dorsal primary ramus of S1; DPR3, dorsal primary ramus of L3; FJ, facet (zygapophyseal) joint L3-L4; I, inferior articular process of L4; LB1, lateral branch of dorsal primary ramus of L1; IAB, inferior articular branches from L3 medial branch (supplies L4-L5 facet joint); IBP, intermediate branch plexus; MB3, medial branch of dorsal primary ramus of L3; NR3, third lumbar nerve root; S, superior articular process of L5; SAB, superior articular branches from L4 (supplies L4-5 facet joint also); TP3, transverse process of L3.  Vitals:   08/23/20 1435 08/23/20 1440 08/23/20 1445 08/23/20 1450  BP: (!) 147/100 (!) 145/87 (!) 148/87 (!) 145/87  Pulse:      Resp: 18 17 18 20    Temp:      TempSrc:      SpO2: 98% 97%  98% 96%  Weight:      Height:         Start Time: 1431 hrs. End Time: 1445 hrs.  Imaging Guidance (Spinal):          Type of Imaging Technique: Fluoroscopy Guidance (Spinal) Indication(s): Assistance in needle guidance and placement for procedures requiring needle placement in or near specific anatomical locations not easily accessible without such assistance. Exposure Time: Please see nurses notes. Contrast: None used. Fluoroscopic Guidance: I was personally present during the use of fluoroscopy. "Tunnel Vision Technique" used to obtain the best possible view of the target area. Parallax error corrected before commencing the procedure. "Direction-depth-direction" technique used to introduce the needle under continuous pulsed fluoroscopy. Once target was reached, antero-posterior, oblique, and lateral fluoroscopic projection used confirm needle placement in all planes. Images permanently stored in EMR. Interpretation: No contrast injected. I personally interpreted the imaging intraoperatively. Adequate needle placement confirmed in multiple planes. Permanent images saved into the patient's record.  Post-operative Assessment:  Post-procedure Vital Signs:  Pulse/HCG Rate: 8189 Temp: 97.7 F (36.5 C) Resp: 20 BP: (!) 145/87 SpO2: 96 %  EBL: None  Complications: No immediate post-treatment complications observed by team, or reported by patient.  Note: The patient tolerated the entire procedure well. A repeat set of vitals were taken after the procedure and the patient was kept under observation following institutional policy, for this type of procedure. Post-procedural neurological assessment was performed, showing return to baseline, prior to discharge. The patient was provided with post-procedure discharge instructions, including a section on how to identify potential problems. Should any problems arise concerning this procedure, the patient was  given instructions to immediately contact us, at any time, without hesitation. In any case, we plan to contact the patient by telephone for a follow-up status report regarding this interventional procedure.  Comments:  No additional relevant information.  Plan of Care  Orders:  Orders Placed This Encounter  Procedures   DG PAIN CLIN[facility] C-ARM 1-60 MIN NO REPORT    Intraoperative interpretation by procedural physician at [facility].    Standing Status:   Standing    Number of Occurrences:   1    Order Specific Question:   Reason for exam:    Answer:   Assistance in needle guidance and placement for procedures requiring needle placement in or near specific anatomical locations not easily accessible without such assistance.   ToxASSURE Select 13 (MW), Urine    Volume: 30 ml(s). Minimum 3 ml of urine is needed. Document temperature of fresh sample. Indications: Long term (current) use of opiate analgesic [phone])    Order Specific Question:   Release to patient    Answer:   Immediate    Medications ordered for procedure: Meds ordered this encounter  Medications   lidocaine (XYLOCAINE) 2 % (with pres) injection 400 mg   DISCONTD: dexamethasone (DECADRON) injection 10 mg   DISCONTD: dexamethasone (DECADRON) injection 10 mg   ropivacaine (PF) 2 mg/mL (0.2%) (NAROPIN) injection 9 mL   ropivacaine (PF) 2 mg/mL (0.2%) (NAROPIN) injection 9 mL   triamcinolone acetonide (KENALOG-40) injection 40 mg   triamcinolone acetonide (KENALOG-40) injection 40 mg   Medications administered: We administered lidocaine, ropivacaine (PF) 2 mg/mL (0.2%), and ropivacaine (PF) 2 mg/mL (0.2%).  See the medical record for exact dosing, route, and time of administration.  Follow-up plan:   Return in about 6 weeks (around 10/04/2020) for Post Procedure Evaluation, Medication Management: Dr Andree Elk.     Recent Visits Date  Type Provider Dept  07/27/20 Telemedicine Molli Barrows, MD [facility]  07/03/20 Office Visit Molli Barrows, MD [facility]  06/12/20 Procedure visit Molli Barrows, MD [facility]  Showing recent visits within past 90 days and meeting all other requirements Today's Visits Date Type Provider Dept  08/23/20 Procedure visit Gillis Santa, MD [facility]  Showing today's visits and meeting all other requirements Future Appointments No visits were found meeting these conditions. Showing future appointments within next 90 days and meeting all other requirements Disposition: Discharge home  Discharge (Date  Time): 08/23/2020; 1500 hrs.   Primary Care Physician: [place]. Location: [facility] Note by: Gillis Santa, MD Date: 08/23/2020; Time: 3:00 PM  Disclaimer:  Medicine is not an exact science. The only guarantee in medicine is that nothing is guaranteed. It is important to note that the decision to proceed with this intervention was based on the information collected from the patient. The Data and conclusions were drawn from the patient's questionnaire, the interview, and the physical examination. Because the information was provided in large part by the patient, it cannot be guaranteed that it has not been purposely or unconsciously manipulated. Every effort has been made to obtain as much relevant data as possible for this evaluation. It is important to note that the conclusions that lead to this procedure are derived in large part from the available data. Always take into account that the treatment will also be dependent on availability of resources and existing treatment guidelines, considered by other Pain Management Practitioners as being common knowledge and practice, at the time of the intervention. For Medico-Legal purposes, it is also important to point out that variation in procedural techniques and pharmacological choices are the acceptable norm. The indications,  contraindications, technique, and results of the above procedure should only be interpreted and judged by a Board-Certified Interventional Pain Specialist with extensive familiarity and expertise in the same exact procedure and technique.

## 2020-08-23 NOTE — Progress Notes (Signed)
Nursing Pain Medication Assessment:  Safety precautions to be maintained throughout the outpatient stay will include: orient to surroundings, keep bed in low position, maintain call bell within reach at all times, provide assistance with transfer out of bed and ambulation.  Medication Inspection Compliance: Pill count conducted under aseptic conditions, in front of the patient. Neither the pills nor the bottle was removed from the patient's sight at any time. Once count was completed pills were immediately returned to the patient in their original bottle.  Medication: Oxycodone IR Pill/Patch Count:  53 of 150 pills remain Pill/Patch Appearance: Markings consistent with prescribed medication Bottle Appearance: Standard pharmacy container. Clearly labeled. Filled Date: 06 / 08 / 2022 Last Medication intake:  Today

## 2020-08-24 ENCOUNTER — Telehealth: Payer: Self-pay

## 2020-08-24 NOTE — Telephone Encounter (Signed)
Post procedure phone call.  Patient states she is fine

## 2020-08-31 LAB — TOXASSURE SELECT 13 (MW), URINE

## 2020-09-14 ENCOUNTER — Other Ambulatory Visit: Payer: Self-pay | Admitting: Family Medicine

## 2020-09-14 DIAGNOSIS — N1831 Chronic kidney disease, stage 3a: Secondary | ICD-10-CM

## 2020-09-21 ENCOUNTER — Other Ambulatory Visit: Payer: Self-pay

## 2020-09-21 ENCOUNTER — Ambulatory Visit
Admission: RE | Admit: 2020-09-21 | Discharge: 2020-09-21 | Disposition: A | Discharge status: Home / Self Care | Payer: Medicaid Other | Source: Ambulatory Visit | Attending: Family Medicine | Admitting: Family Medicine

## 2020-09-21 DIAGNOSIS — N1831 Chronic kidney disease, stage 3a: Secondary | ICD-10-CM

## 2020-09-27 ENCOUNTER — Other Ambulatory Visit: Payer: Self-pay

## 2020-09-27 ENCOUNTER — Encounter: Payer: Self-pay | Admitting: Oncology

## 2020-09-27 ENCOUNTER — Inpatient Hospital Stay: Payer: Medicaid Other

## 2020-09-27 ENCOUNTER — Inpatient Hospital Stay: Payer: Medicaid Other | Attending: Oncology | Admitting: Oncology

## 2020-09-27 VITALS — BP 154/89 | HR 74 | Temp 97.8°F | Resp 18 | Wt 233.1 lb

## 2020-09-27 DIAGNOSIS — Z800 Family history of malignant neoplasm of digestive organs: Secondary | ICD-10-CM | POA: Insufficient documentation

## 2020-09-27 DIAGNOSIS — Z803 Family history of malignant neoplasm of breast: Secondary | ICD-10-CM | POA: Diagnosis not present

## 2020-09-27 DIAGNOSIS — F17210 Nicotine dependence, cigarettes, uncomplicated: Secondary | ICD-10-CM | POA: Diagnosis not present

## 2020-09-27 DIAGNOSIS — G8929 Other chronic pain: Secondary | ICD-10-CM | POA: Diagnosis not present

## 2020-09-27 DIAGNOSIS — D72829 Elevated white blood cell count, unspecified: Secondary | ICD-10-CM

## 2020-09-27 DIAGNOSIS — M549 Dorsalgia, unspecified: Secondary | ICD-10-CM | POA: Insufficient documentation

## 2020-09-27 LAB — CBC WITH DIFFERENTIAL/PLATELET
Abs Immature Granulocytes: 0.04 10*3/uL (ref 0.00–0.07)
Basophils Absolute: 0 10*3/uL (ref 0.0–0.1)
Basophils Relative: 0 %
Eosinophils Absolute: 0 10*3/uL (ref 0.0–0.5)
Eosinophils Relative: 1 %
HCT: 45 % (ref 36.0–46.0)
Hemoglobin: 15 g/dL (ref 12.0–15.0)
Immature Granulocytes: 1 %
Lymphocytes Relative: 46 %
Lymphs Abs: 3.8 10*3/uL (ref 0.7–4.0)
MCH: 30.4 pg (ref 26.0–34.0)
MCHC: 33.3 g/dL (ref 30.0–36.0)
MCV: 91.1 fL (ref 80.0–100.0)
Monocytes Absolute: 0.7 10*3/uL (ref 0.1–1.0)
Monocytes Relative: 8 %
Neutro Abs: 3.5 10*3/uL (ref 1.7–7.7)
Neutrophils Relative %: 44 %
Platelets: 253 10*3/uL (ref 150–400)
RBC: 4.94 MIL/uL (ref 3.87–5.11)
RDW: 12.5 % (ref 11.5–15.5)
WBC: 8 10*3/uL (ref 4.0–10.5)
nRBC: 0 % (ref 0.0–0.2)

## 2020-09-27 LAB — SEDIMENTATION RATE: Sed Rate: 3 mm/hr (ref 0–30)

## 2020-09-27 LAB — TECHNOLOGIST SMEAR REVIEW: Plt Morphology: NORMAL
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
## 2020-09-28 ENCOUNTER — Encounter: Payer: Self-pay | Admitting: Anesthesiology

## 2020-09-28 ENCOUNTER — Ambulatory Visit: Payer: Medicaid Other | Attending: Anesthesiology | Admitting: Anesthesiology

## 2020-09-28 DIAGNOSIS — Z79891 Long term (current) use of opiate analgesic: Secondary | ICD-10-CM

## 2020-09-28 DIAGNOSIS — G8929 Other chronic pain: Secondary | ICD-10-CM

## 2020-09-28 DIAGNOSIS — G894 Chronic pain syndrome: Secondary | ICD-10-CM

## 2020-09-28 DIAGNOSIS — M5442 Lumbago with sciatica, left side: Secondary | ICD-10-CM

## 2020-09-28 DIAGNOSIS — M545 Low back pain, unspecified: Secondary | ICD-10-CM

## 2020-09-28 DIAGNOSIS — M48062 Spinal stenosis, lumbar region with neurogenic claudication: Secondary | ICD-10-CM

## 2020-09-28 DIAGNOSIS — M5441 Lumbago with sciatica, right side: Secondary | ICD-10-CM

## 2020-09-28 DIAGNOSIS — M5030 Other cervical disc degeneration, unspecified cervical region: Secondary | ICD-10-CM | POA: Diagnosis not present

## 2020-09-28 DIAGNOSIS — M47816 Spondylosis without myelopathy or radiculopathy, lumbar region: Secondary | ICD-10-CM

## 2020-09-28 DIAGNOSIS — M542 Cervicalgia: Secondary | ICD-10-CM

## 2020-09-28 MED ORDER — OXYCODONE HCL 5 MG PO TABS: 5.0000 mg | ORAL_TABLET | ORAL | 0 refills | Status: DC | PRN

## 2020-09-28 MED ORDER — OXYCODONE HCL 5 MG PO TABS: 5.0000 mg | ORAL_TABLET | ORAL | 0 refills | Status: AC | PRN

## 2020-09-28 NOTE — Progress Notes (Addendum)
Virtual Visit via Telephone Note  I connected with [name] on 09/28/20 at  4:20 PM EDT by telephone and verified that I am speaking with the correct person using two identifiers.  Location: Patient: Home Provider: Pain control center   I discussed the limitations, risks, security and privacy concerns of performing an evaluation and management service by telephone and the availability of in person appointments. I also discussed with the patient that there may be a patient responsible charge related to this service. The patient expressed understanding and agreed to proceed.   History of Present Illness: I spoke to [name] today via telephone as we were unable to link for the video portion of the virtual conference and she reports that she is beginning to have a considerable amount of lower extremity sciatica symptoms.  She had an epidural back in April which knocked out at 90% of her lower extremity pain and 50% of her low back pain.  She describes the pain as a VAS of 7 or 8 out of 10 which is limiting her ability to sleep her remain active with daily household activities. Over the course of the last few weeks she has had recurrence of the same pain and she is desiring to proceed with a repeat epidural injection.  She is also having some neck pain which is somewhat typical for her.  She had a recent CT cervical spine film that showed evidence of diffuse osteoarthritic changes.  The quality characteristic and distribution of her neck pain is stable but worse with activity.  She has been doing some stretching exercises and physical therapy with limited success.  She also takes her baclofen twice a day.  She has tried an orthotic pillow with limited success.  In regards to her low back pain this is been stable in nature and the quality characteristic and distribution of the symptoms have also been stable.  No change in bowel or bladder function or lower extremity strength or function is noted.   She is taking her medications as prescribed and these continue to work well for her.  No side effects are reported.Review of systems: General: No fevers or chills Pulmonary: No shortness of breath or dyspnea Cardiac: No angina or palpitations or lightheadedness GI: No abdominal pain or constipation Psych: No depression    Observations/Objective:   Current Outpatient Medications:    [START ON 10/30/2020] oxyCODONE (ROXICODONE) 5 MG immediate release tablet, Take 1 tablet (5 mg total) by mouth every 4 (four) hours as needed for severe pain., Disp: 150 tablet, Rfl: 0   atorvastatin (LIPITOR) 10 MG tablet, Take 10 mg by mouth daily., Disp: , Rfl:    baclofen (LIORESAL) 10 MG tablet, Take 10 mg by mouth 2 (two) times daily., Disp: , Rfl:    benzonatate (TESSALON) 200 MG capsule, , Disp: , Rfl:    buPROPion (WELLBUTRIN SR) 150 MG 12 hr tablet, , Disp: , Rfl:    fluticasone (FLONASE) 50 MCG/ACT nasal spray, Place into the nose. (Patient not taking: Reported on 09/27/2020), Disp: , Rfl:    fluticasone (FLONASE) 50 MCG/ACT nasal spray, Place into the nose. (Patient not taking: Reported on 09/27/2020), Disp: , Rfl:    ibuprofen (ADVIL,MOTRIN) 200 MG tablet, Take 200 mg by mouth every 6 (six) hours as needed. (Patient not taking: Reported on 09/27/2020), Disp: , Rfl:    montelukast (SINGULAIR) 10 MG tablet, Take 10 mg by mouth at bedtime. (Patient not taking: Reported on 09/27/2020), Disp: , Rfl:  Nerve Stimulator (ZEWA TENS/EMS COMBO UNIT) DEVI, by Does not apply route. (Patient not taking: Reported on 09/27/2020), Disp: , Rfl:    [START ON 09/30/2020] oxyCODONE (ROXICODONE) 5 MG immediate release tablet, Take 1 tablet (5 mg total) by mouth every 4 (four) hours as needed for severe pain., Disp: 150 tablet, Rfl: 0   pregabalin (LYRICA) 75 MG capsule, Take 2 capsules (150 mg total) by mouth 2 (two) times daily., Disp: 120 capsule, Rfl: 3   PROAIR HFA 108 (90 Base) MCG/ACT inhaler, Inhale 2 puffs into the lungs as  needed., Disp: , Rfl:    sertraline (ZOLOFT) 100 MG tablet, Take 200 mg by mouth at bedtime. , Disp: , Rfl:    sertraline (ZOLOFT) 50 MG tablet, Take 200 mg by mouth daily. (Patient not taking: Reported on 09/27/2020), Disp: , Rfl:    SYMBICORT 80-4.5 MCG/ACT inhaler, Inhale 2 puffs into the lungs 2 (two) times daily. (Patient not taking: Reported on 09/27/2020), Disp: , Rfl:    traZODone (DESYREL) 50 MG tablet, Take by mouth., Disp: , Rfl:   Current Facility-Administered Medications:    lidocaine (PF) (XYLOCAINE) 1 % injection 5 mL, 5 mL, Subcutaneous, Once, Governor Matos, Alvina Filbert, MD   ropivacaine (PF) 2 mg/mL (0.2%) (NAROPIN) injection 10 mL, 10 mL, Epidural, Once, Molli Barrows, MD  Assessment and Plan: 1. Lumbar spondylosis   2. Facet arthritis of lumbar region   3. Long term current use of opiate analgesic   4. Chronic pain syndrome   5. DDD (degenerative disc disease), cervical   6. Chronic bilateral low back pain with bilateral sciatica   7. Spinal stenosis of lumbar region with neurogenic claudication   8. Cervicalgia   9. Chronic bilateral low back pain without sciatica   Based on her discussion today and upon review of the [facility] practitioner database information I am going to refill her medication for the oxycodone at 5 mg strength to be taken every 4 hours.  This works well for her and keeps her symptoms under good control.  She reports no side effects and continues to derive good functional lifestyle improvement with them based on our conversation.  No illicit or diverting use is noted and her recent urine screen was appropriate.  I am also going to schedule her for repeat lumbar L5S1 epidural in approximately 1 month.  Her pain continues to be in the 7 to 8 out of 10 range as she continues to be limited in her activities of daily living. We have gone over the risks and benefits of the procedure with her in detail.  I want her to continue efforts at weight loss stretching strengthening  etc.  No other changes in her pain management protocol will be initiated but we did talk about application of a TENS unit for her cervicalgia symptoms.  She is to continue follow-up with her primary care physicians for baseline medical care.  Follow Up Instructions:    I discussed the assessment and treatment plan with the patient. The patient was provided an opportunity to ask questions and all were answered. The patient agreed with the plan and demonstrated an understanding of the instructions.   The patient was advised to call back or seek an in-person evaluation if the symptoms worsen or if the condition fails to improve as anticipated.  I provided 30 minutes of non-face-to-face time during this encounter.   Molli Barrows, MD

## 2020-10-02 LAB — COMP PANEL: LEUKEMIA/LYMPHOMA

## 2020-10-05 ENCOUNTER — Inpatient Hospital Stay (HOSPITAL_BASED_OUTPATIENT_CLINIC_OR_DEPARTMENT_OTHER): Payer: Medicaid Other | Admitting: Oncology

## 2020-10-05 ENCOUNTER — Encounter: Payer: Self-pay | Admitting: Oncology

## 2020-10-05 DIAGNOSIS — F17200 Nicotine dependence, unspecified, uncomplicated: Secondary | ICD-10-CM

## 2020-10-05 DIAGNOSIS — D729 Disorder of white blood cells, unspecified: Secondary | ICD-10-CM
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
## 2020-10-30 ENCOUNTER — Other Ambulatory Visit: Payer: Self-pay | Admitting: Anesthesiology

## 2020-11-02 ENCOUNTER — Ambulatory Visit: Payer: Medicaid Other | Admitting: Anesthesiology

## 2020-11-13 ENCOUNTER — Other Ambulatory Visit: Payer: Self-pay | Admitting: *Deleted

## 2020-11-13 DIAGNOSIS — F17210 Nicotine dependence, cigarettes, uncomplicated: Secondary | ICD-10-CM

## 2020-11-13 DIAGNOSIS — Z87891 Personal history of nicotine dependence: Secondary | ICD-10-CM

## 2020-11-28 ENCOUNTER — Ambulatory Visit: Payer: Medicaid Other | Attending: Anesthesiology | Admitting: Anesthesiology

## 2020-11-28 ENCOUNTER — Encounter: Payer: Self-pay | Admitting: Anesthesiology

## 2020-11-28 ENCOUNTER — Other Ambulatory Visit: Payer: Self-pay

## 2020-11-28 DIAGNOSIS — M48062 Spinal stenosis, lumbar region with neurogenic claudication: Secondary | ICD-10-CM

## 2020-11-28 DIAGNOSIS — M542 Cervicalgia: Secondary | ICD-10-CM

## 2020-11-28 DIAGNOSIS — M5441 Lumbago with sciatica, right side: Secondary | ICD-10-CM

## 2020-11-28 DIAGNOSIS — Z79891 Long term (current) use of opiate analgesic: Secondary | ICD-10-CM | POA: Diagnosis not present

## 2020-11-28 DIAGNOSIS — G894 Chronic pain syndrome: Secondary | ICD-10-CM | POA: Diagnosis not present

## 2020-11-28 DIAGNOSIS — M5442 Lumbago with sciatica, left side: Secondary | ICD-10-CM

## 2020-11-28 DIAGNOSIS — M47816 Spondylosis without myelopathy or radiculopathy, lumbar region: Secondary | ICD-10-CM

## 2020-11-28 DIAGNOSIS — M5030 Other cervical disc degeneration, unspecified cervical region: Secondary | ICD-10-CM

## 2020-11-28 DIAGNOSIS — G8929 Other chronic pain: Secondary | ICD-10-CM

## 2020-11-28 MED ORDER — BACLOFEN 10 MG PO TABS: 10.0000 mg | ORAL_TABLET | Freq: Two times a day (BID) | ORAL | 4 refills | Status: AC

## 2020-11-28 MED ORDER — PREGABALIN 75 MG PO CAPS: 150.0000 mg | ORAL_CAPSULE | Freq: Two times a day (BID) | ORAL | 3 refills | Status: DC

## 2020-11-28 MED ORDER — OXYCODONE HCL 5 MG PO TABS: 5.0000 mg | ORAL_TABLET | ORAL | 0 refills | Status: DC | PRN

## 2020-11-28 NOTE — Progress Notes (Signed)
Virtual Visit via Video Note  I connected with Hannah Maynard on 11/28/20 at  1:55 PM EDT by a video enabled telemedicine application and verified that I am speaking with the correct person using two identifiers.  Location: Patient: Home Provider: Pain control center   I discussed the limitations of evaluation and management by telemedicine and the availability of in person appointments. The patient expressed understanding and agreed to proceed.  History of Present Illness: I spoke with Hannah Maynard via telephone as we were unable link for the video portion of virtual conference.  She reports that she is still having a lot of sciatica symptoms and low back pain.  She is scheduled to present to clinic within the next several days for a repeat epidural.  She has these periodically for control of her low back pain and sciatica symptoms and they generally give her excellent relief rated at approximate 75 to 90% lasting 2 to 3 months.  Her last injection was in April and she did well with that as well.  She continues to try to do her core stretching strengthening exercises but these have been minimally successful.  She is trying to stay active but with COVID and her recent weight gain she struggles.  She is taking her medications as prescribed and these do give her good relief.  She derives good functional lifestyle improvement with them with no side effects reported.  She generally has 50 to 75% relief with the medications lasting 4 to 6 hours before she has recurrence of the same baseline pain.  She takes them on a every 4 to every 6 hours basis.  Review of systems: General: No fevers or chills Pulmonary: No shortness of breath or dyspnea Cardiac: No angina or palpitations or lightheadedness GI: No abdominal pain or constipation Psych: No depression    Observations/Objective:  Current Outpatient Medications:    [START ON 12/30/2020] oxyCODONE (ROXICODONE) 5 MG immediate release tablet, Take 1  tablet (5 mg total) by mouth every 4 (four) hours as needed for severe pain., Disp: 150 tablet, Rfl: 0   atorvastatin (LIPITOR) 10 MG tablet, Take 10 mg by mouth daily., Disp: , Rfl:    baclofen (LIORESAL) 10 MG tablet, Take 1 tablet (10 mg total) by mouth 2 (two) times daily., Disp: 30 each, Rfl: 4   buPROPion (WELLBUTRIN SR) 150 MG 12 hr tablet, Take 150 mg by mouth daily as needed., Disp: , Rfl:    fluticasone (FLONASE) 50 MCG/ACT nasal spray, Place into the nose. (Patient not taking: No sig reported), Disp: , Rfl:    fluticasone (FLONASE) 50 MCG/ACT nasal spray, Place into the nose. (Patient not taking: No sig reported), Disp: , Rfl:    ibuprofen (ADVIL,MOTRIN) 200 MG tablet, Take 200 mg by mouth every 6 (six) hours as needed. (Patient not taking: Reported on 09/27/2020), Disp: , Rfl:    montelukast (SINGULAIR) 10 MG tablet, Take 10 mg by mouth at bedtime. (Patient not taking: No sig reported), Disp: , Rfl:    Nerve Stimulator (ZEWA TENS/EMS COMBO UNIT) DEVI, by Does not apply route. (Patient not taking: Reported on 09/27/2020), Disp: , Rfl:    oxyCODONE (ROXICODONE) 5 MG immediate release tablet, Take 1 tablet (5 mg total) by mouth every 4 (four) hours as needed for severe pain., Disp: 150 tablet, Rfl: 0   pregabalin (LYRICA) 75 MG capsule, Take 2 capsules (150 mg total) by mouth 2 (two) times daily., Disp: 120 capsule, Rfl: 3   PROAIR HFA 108 (  90 Base) MCG/ACT inhaler, Inhale 2 puffs into the lungs as needed., Disp: , Rfl:    sertraline (ZOLOFT) 100 MG tablet, Take 200 mg by mouth at bedtime. , Disp: , Rfl:    SYMBICORT 80-4.5 MCG/ACT inhaler, Inhale 2 puffs into the lungs 2 (two) times daily. (Patient not taking: Reported on 09/27/2020), Disp: , Rfl:    traZODone (DESYREL) 50 MG tablet, Take 50 mg by mouth at bedtime., Disp: , Rfl:   Current Facility-Administered Medications:    lidocaine (PF) (XYLOCAINE) 1 % injection 5 mL, 5 mL, Subcutaneous, Once, Yardley Beltran, Alvina Filbert, MD   ropivacaine (PF) 2 mg/mL  (0.2%) (NAROPIN) injection 10 mL, 10 mL, Epidural, Once, Andree Elk Alvina Filbert, MD   Assessment and Plan: 1. Lumbar spondylosis   2. Facet arthritis of lumbar region   3. Long term current use of opiate analgesic   4. Chronic pain syndrome   5. DDD (degenerative disc disease), cervical   6. Chronic bilateral low back pain with bilateral sciatica   7. Spinal stenosis of lumbar region with neurogenic claudication   8. Cervicalgia   Based on discussion today and after review of the [facility] practitioner database information I think it is appropriate to refill her medications for the next 2 months.  This to be dated for October 5 in November 4.  I want her to continue efforts at weight loss stretching strengthening as previously reviewed with an epidural here in the next few days.  Continue follow-up with her primary care physicians for baseline medical care as well.  Follow Up Instructions:    I discussed the assessment and treatment plan with the patient. The patient was provided an opportunity to ask questions and all were answered. The patient agreed with the plan and demonstrated an understanding of the instructions.   The patient was advised to call back or seek an in-person evaluation if the symptoms worsen or if the condition fails to improve as anticipated.  I provided 30 minutes of non-face-to-face time during this encounter.   Molli Barrows, MD

## 2020-11-29 ENCOUNTER — Ambulatory Visit
Admission: RE | Admit: 2020-11-29 | Discharge: 2020-11-29 | Disposition: A | Discharge status: Home / Self Care | Payer: Medicaid Other | Source: Ambulatory Visit | Attending: Acute Care | Admitting: Acute Care

## 2020-11-29 ENCOUNTER — Encounter: Payer: Self-pay | Admitting: Acute Care

## 2020-11-29 ENCOUNTER — Telehealth: Payer: Self-pay

## 2020-11-29 ENCOUNTER — Other Ambulatory Visit: Payer: Self-pay

## 2020-11-29 ENCOUNTER — Ambulatory Visit (INDEPENDENT_AMBULATORY_CARE_PROVIDER_SITE_OTHER): Payer: Medicaid Other | Admitting: Acute Care

## 2020-11-29 DIAGNOSIS — R918 Other nonspecific abnormal finding of lung field: Secondary | ICD-10-CM | POA: Diagnosis not present

## 2020-11-29 DIAGNOSIS — F17210 Nicotine dependence, cigarettes, uncomplicated: Secondary | ICD-10-CM

## 2020-11-29 DIAGNOSIS — Z87891 Personal history of nicotine dependence: Secondary | ICD-10-CM

## 2020-11-29 DIAGNOSIS — J432 Centrilobular emphysema: Secondary | ICD-10-CM | POA: Diagnosis not present

## 2020-11-29 NOTE — Patient Instructions (Signed)
Thank you for participating in the [facility] Screening Program. It was our pleasure to meet you today. We will call you with the results of your scan within the next few days. Your scan will be assigned a Lung RADS category score by the physicians reading the scans.  This Lung RADS score determines follow up scanning.  See below for description of categories, and follow up screening recommendations. We will be in touch to schedule your follow up screening annually or based on recommendations of our providers. We will fax a copy of your scan results to your Primary Care Physician, or the physician who referred you to the program, to ensure they have the results. Please call the office if you have any questions or concerns regarding your scanning experience or results.  Our office number is [phone]. Please speak with Denise Phelps, RN. She is our Lung Cancer Screening RN. If she is unavailable when you call, please have the office staff send her a message. She will return your call at her earliest convenience. Remember, if your scan is normal, we will scan you annually as long as you continue to meet the criteria for the program. (Age 57-77, Current smoker or smoker who has quit within the last 15 years). If you are a smoker, remember, quitting is the single most powerful action that you can take to decrease your risk of lung cancer and other pulmonary, breathing related problems. We know quitting is hard, and we are here to help.  Please let us know if there is anything we can do to help you meet your goal of quitting. If you are a former smoker, congratulations. We are proud of you! Remain smoke free! Remember you can refer friends or family members through the number above.  We will screen them to make sure they meet criteria for the program. Thank you for helping us take better care of you by participating in Lung Screening.  Lung RADS Categories:  Lung RADS 1: no nodules  or definitely non-concerning nodules.  Recommendation is for a repeat annual scan in 12 months.  Lung RADS 2:  nodules that are non-concerning in appearance and behavior with a very low likelihood of becoming an active cancer. Recommendation is for a repeat annual scan in 12 months.  Lung RADS 3: nodules that are probably non-concerning , includes nodules with a low likelihood of becoming an active cancer.  Recommendation is for a 6-month repeat screening scan. Often noted after an upper respiratory illness. We will be in touch to make sure you have no questions, and to schedule your 6-month scan.  Lung RADS 4 A: nodules with concerning findings, recommendation is most often for a follow up scan in 3 months or additional testing based on our provider's assessment of the scan. We will be in touch to make sure you have no questions and to schedule the recommended 3 month follow up scan.  Lung RADS 4 B:  indicates findings that are concerning. We will be in touch with you to schedule additional diagnostic testing based on our provider's  assessment of the scan.   

## 2020-11-29 NOTE — Telephone Encounter (Signed)
Pt says she needs a prior auth for her meds. That she has to pay out of pocket on yesterday to pick up her Rx

## 2020-11-29 NOTE — Progress Notes (Signed)
Virtual Visit via Telephone Note  I connected with [place] on 11/29/20 at  9:30 AM EDT by telephone and verified that I am speaking with the correct person using two identifiers.  Location: Patient: At home Provider: [place], [place], [place], [address]    I discussed the limitations, risks, security and privacy concerns of performing an evaluation and management service by telephone and the availability of in person appointments. I also discussed with the patient that there may be a patient responsible charge related to this service. The patient expressed understanding and agreed to proceed.     Shared Decision Making Visit Lung Cancer Screening Program [phone])   Eligibility: Age 55 y.o. Pack Years Smoking History Calculation 22 pack year history (# packs/per year x # years smoked) Recent History of coughing up blood  no Unexplained weight loss? no ( >Than 15 pounds within the last 6 months ) Prior History Lung / other cancer no (Diagnosis within the last 5 years already requiring surveillance chest CT Scans). Smoking Status Current Smoker Former Smokers: Years since quit: NA  Quit Date: NA  Visit Components: Discussion included one or more decision making aids. yes Discussion included risk/benefits of screening. yes Discussion included potential follow up diagnostic testing for abnormal scans. yes Discussion included meaning and risk of over diagnosis. yes Discussion included meaning and risk of False Positives. yes Discussion included meaning of total radiation exposure. yes  Counseling Included: Importance of adherence to annual lung cancer LDCT screening. yes Impact of comorbidities on ability to participate in the program. yes Ability and willingness to under diagnostic treatment. yes  Smoking Cessation Counseling: Current Smokers:  Discussed importance of smoking cessation. yes Information about tobacco cessation classes and interventions  provided to patient. yes Patient provided with "ticket" for LDCT Scan. yes Symptomatic Patient. no  Counseling Diagnosis Code: Tobacco Use Z72.0 Asymptomatic Patient yes  Counseling (Intermediate counseling: > three minutes counseling) Z6606 Former Smokers:  Discussed the importance of maintaining cigarette abstinence. yes Diagnosis Code: Personal History of Nicotine Dependence. T01.601 Information about tobacco cessation classes and interventions provided to patient. Yes Patient provided with "ticket" for LDCT Scan. yes Written Order for Lung Cancer Screening with LDCT placed in Epic. Yes (CT Chest Lung Cancer Screening Low Dose W/O CM) [id number] Z12.2-Screening of respiratory organs Z87.891-Personal history of nicotine dependence  I have spent 25 minutes of face to face time with Hannah Maynard discussing the risks and benefits of lung cancer screening. We viewed a power point together that explained in detail the above noted topics. We paused at intervals to allow for questions to be asked and answered to ensure understanding.We discussed that the single most powerful action that she can take to decrease her risk of developing lung cancer is to quit smoking. We discussed whether or not she is ready to commit to setting a quit date. We discussed options for tools to aid in quitting smoking including nicotine replacement therapy, non-nicotine medications, support groups, Quit Smart classes, and behavior modification. We discussed that often times setting smaller, more achievable goals, such as eliminating 1 cigarette a day for a week and then 2 cigarettes a day for a week can be helpful in slowly decreasing the number of cigarettes smoked. This allows for a sense of accomplishment as well as providing a clinical benefit. I gave her the " Be Stronger Than Your Excuses" card with contact information for community resources, classes, free nicotine replacement therapy, and access to mobile apps,  text  messaging, and on-line smoking cessation help. I have also given her my card and contact information in the event she needs to contact me. We discussed the time and location of the scan, and that either Doroteo Glassman RN or I will call with the results within 24-48 hours of receiving them. I have offered her  a copy of the power point we viewed  as a resource in the event they need reinforcement of the concepts we discussed today in the office. The patient verbalized understanding of all of  the above and had no further questions upon leaving the office. They have my contact information in the event they have any further questions.  I spent 3 minutes counseling on smoking cessation and the health risks of continued tobacco abuse.  I explained to the patient that there has been a high incidence of coronary artery disease noted on these exams. I explained that this is a non-gated exam therefore degree or severity cannot be determined. This patient is on statin therapy. I have asked the patient to follow-up with their PCP regarding any incidental finding of coronary artery disease and management with diet or medication as their PCP  feels is clinically indicated. The patient verbalized understanding of the above and had no further questions upon completion of the visit.      Magdalen Spatz, NP 11/29/2020

## 2020-11-29 NOTE — Telephone Encounter (Signed)
PA sent via [facility] with notes.

## 2020-12-11 ENCOUNTER — Other Ambulatory Visit: Payer: Self-pay

## 2020-12-11 ENCOUNTER — Other Ambulatory Visit: Payer: Self-pay | Admitting: Anesthesiology

## 2020-12-11 ENCOUNTER — Encounter: Payer: Self-pay | Admitting: Anesthesiology

## 2020-12-11 ENCOUNTER — Ambulatory Visit
Admission: RE | Admit: 2020-12-11 | Discharge: 2020-12-11 | Disposition: A | Discharge status: Home / Self Care | Payer: Medicaid Other | Source: Ambulatory Visit | Attending: Anesthesiology | Admitting: Anesthesiology

## 2020-12-11 ENCOUNTER — Ambulatory Visit (HOSPITAL_BASED_OUTPATIENT_CLINIC_OR_DEPARTMENT_OTHER): Payer: Medicaid Other | Admitting: Anesthesiology

## 2020-12-11 VITALS — BP 142/98 | HR 95 | Temp 97.1°F | Resp 15 | Ht 65.0 in | Wt 237.0 lb

## 2020-12-11 DIAGNOSIS — M5441 Lumbago with sciatica, right side: Secondary | ICD-10-CM | POA: Diagnosis present

## 2020-12-11 DIAGNOSIS — G894 Chronic pain syndrome: Secondary | ICD-10-CM

## 2020-12-11 DIAGNOSIS — G8929 Other chronic pain: Secondary | ICD-10-CM | POA: Diagnosis present

## 2020-12-11 DIAGNOSIS — Z79891 Long term (current) use of opiate analgesic: Secondary | ICD-10-CM | POA: Diagnosis present

## 2020-12-11 DIAGNOSIS — M5442 Lumbago with sciatica, left side: Secondary | ICD-10-CM | POA: Diagnosis present

## 2020-12-11 DIAGNOSIS — M48062 Spinal stenosis, lumbar region with neurogenic claudication: Secondary | ICD-10-CM

## 2020-12-11 DIAGNOSIS — M5030 Other cervical disc degeneration, unspecified cervical region: Secondary | ICD-10-CM | POA: Diagnosis present

## 2020-12-11 DIAGNOSIS — M47816 Spondylosis without myelopathy or radiculopathy, lumbar region: Secondary | ICD-10-CM

## 2020-12-11 DIAGNOSIS — R52 Pain, unspecified: Secondary | ICD-10-CM

## 2020-12-11 MED ORDER — LIDOCAINE HCL (PF) 1 % IJ SOLN
INTRAMUSCULAR | Status: AC
  Filled 2020-12-11: qty 10

## 2020-12-11 MED ORDER — LIDOCAINE HCL (PF) 1 % IJ SOLN
5.0000 mL | Freq: Once | INTRAMUSCULAR | Status: AC
  Administered 2020-12-11: 5 mL via SUBCUTANEOUS

## 2020-12-11 MED ORDER — ROPIVACAINE HCL 2 MG/ML IJ SOLN
INTRAMUSCULAR | Status: AC
  Filled 2020-12-11: qty 20

## 2020-12-11 MED ORDER — SODIUM CHLORIDE (PF) 0.9 % IJ SOLN
INTRAMUSCULAR | Status: AC
  Filled 2020-12-11: qty 10

## 2020-12-11 MED ORDER — TRIAMCINOLONE ACETONIDE 40 MG/ML IJ SUSP
40.0000 mg | Freq: Once | INTRAMUSCULAR | Status: AC
  Administered 2020-12-11: 40 mg

## 2020-12-11 MED ORDER — OXYCODONE HCL 5 MG PO TABS: 5.0000 mg | ORAL_TABLET | ORAL | 0 refills | Status: AC | PRN

## 2020-12-11 MED ORDER — ROPIVACAINE HCL 2 MG/ML IJ SOLN
10.0000 mL | Freq: Once | INTRAMUSCULAR | Status: AC
  Administered 2020-12-11: 10 mL via EPIDURAL

## 2020-12-11 MED ORDER — IOHEXOL 180 MG/ML  SOLN
10.0000 mL | Freq: Once | INTRAMUSCULAR | Status: AC | PRN
  Administered 2020-12-11: 5 mL via EPIDURAL

## 2020-12-11 MED ORDER — SODIUM CHLORIDE 0.9% FLUSH
10.0000 mL | Freq: Once | INTRAVENOUS | Status: AC
  Administered 2020-12-11: 10 mL

## 2020-12-11 MED ORDER — TRIAMCINOLONE ACETONIDE 40 MG/ML IJ SUSP
INTRAMUSCULAR | Status: AC
  Filled 2020-12-11: qty 1

## 2020-12-11 MED ORDER — OXYCODONE HCL 5 MG PO TABS: 5.0000 mg | ORAL_TABLET | ORAL | 0 refills | Status: DC | PRN

## 2020-12-11 NOTE — Progress Notes (Signed)
Safety precautions to be maintained throughout the outpatient stay will include: orient to surroundings, keep bed in low position, maintain call bell within reach at all times, provide assistance with transfer out of bed and ambulation.  

## 2020-12-11 NOTE — Patient Instructions (Signed)

## 2020-12-12 ENCOUNTER — Telehealth: Payer: Self-pay

## 2020-12-12 NOTE — Progress Notes (Signed)
Subjective:  Patient ID: Hannah Maynard, female    DOB: Dec 16, 1965  Age: 55 y.o. MRN: [id number]  CC: Back Pain (Lumbar bilateral )   Procedure: L5-S1 epidural steroid under fluoroscopic guidance with no sedation  HPI Hannah Maynard presents for reevaluation.  [name] continues to have severe pain radiating from the low back into the bilateral hips buttocks and down the posterior lateral legs.  This is been quite disabling for her and previously she has had epidural injections helping with the sciatica type symptoms.  Unfortunately she has failed conservative therapy and continues to have severe incapacitating pain that is present despite her medication management.  Generally her medications keep her low back pain under control but this pain is more severe.  It is worse with any type of prolonged standing and she describes it as a gnawing aching pain with lancinating character worse with standing and activity.  Physical therapy exercises have been insufficient to help.  She does her stretching exercises and tries to walk but is limited by this persistent sciatica type pain per her report.  No change in lower extremity strength or function is noted but she does have occasional giveaway weakness with standing and certain positional movements.  She also reports some pain with standing and extension and lateral rotation.  This pain is worse with prolonged standing and certain activities.  She notes this especially when she is trying to vacuum or do any type of rotational activity.  In the past she reports that she has had facet injections for her low back pain and this does help significantly.  She generally reports 75% improvement with the facet injections lasting about 2 months before she gets a gradual recurrence of that same type of spasming pain in the low back with radiation into the hip and buttock region.  She is taking her medications and these do work for her.  She denies any side effects  with them.  She generally gets about 50% relief with her oxycodone and she is taking these about every 4-6 hour  No problems with the medications are noted and she does function more effectively with them where she has failed more conservative therapy.  She denies any diverting or illicit use. Outpatient Medications Prior to Visit  Medication Sig Dispense Refill   atorvastatin (LIPITOR) 10 MG tablet Take 10 mg by mouth daily.     baclofen (LIORESAL) 10 MG tablet Take 1 tablet (10 mg total) by mouth 2 (two) times daily. 30 each 4   buPROPion (WELLBUTRIN SR) 150 MG 12 hr tablet Take 150 mg by mouth daily as needed.     pregabalin (LYRICA) 75 MG capsule Take 2 capsules (150 mg total) by mouth 2 (two) times daily. 120 capsule 3   PROAIR HFA 108 (90 Base) MCG/ACT inhaler Inhale 2 puffs into the lungs as needed.     sertraline (ZOLOFT) 100 MG tablet Take 200 mg by mouth at bedtime.      traZODone (DESYREL) 50 MG tablet Take 50 mg by mouth at bedtime.     fluticasone (FLONASE) 50 MCG/ACT nasal spray Place into the nose. (Patient not taking: No sig reported)     fluticasone (FLONASE) 50 MCG/ACT nasal spray Place into the nose. (Patient not taking: No sig reported)     ibuprofen (ADVIL,MOTRIN) 200 MG tablet Take 200 mg by mouth every 6 (six) hours as needed. (Patient not taking: No sig reported)     montelukast (SINGULAIR) 10 MG tablet  Take 10 mg by mouth at bedtime. (Patient not taking: No sig reported)     Nerve Stimulator (ZEWA TENS/EMS COMBO UNIT) DEVI by Does not apply route. (Patient not taking: No sig reported)     SYMBICORT 80-4.5 MCG/ACT inhaler Inhale 2 puffs into the lungs 2 (two) times daily. (Patient not taking: No sig reported)     oxyCODONE (ROXICODONE) 5 MG immediate release tablet Take 1 tablet (5 mg total) by mouth every 4 (four) hours as needed for severe pain. 150 tablet 0   [START ON 12/30/2020] oxyCODONE (ROXICODONE) 5 MG immediate release tablet Take 1 tablet (5 mg total) by mouth every  4 (four) hours as needed for severe pain. 150 tablet 0   Facility-Administered Medications Prior to Visit  Medication Dose Route Frequency Provider Last Rate Last Admin   lidocaine (PF) (XYLOCAINE) 1 % injection 5 mL  5 mL Subcutaneous Once Molli Barrows, MD       ropivacaine (PF) 2 mg/mL (0.2%) (NAROPIN) injection 10 mL  10 mL Epidural Once Molli Barrows, MD        Review of Systems CNS: No confusion or sedation Cardiac: No angina or palpitations GI: No abdominal pain or constipation Constitutional: No nausea vomiting fevers or chills  Objective:  BP (!) 142/98   Pulse 95 Comment: NSR  Temp (!) 97.1 F (36.2 C) (Temporal)   Resp 15   Ht 5\' 5"  (1.651 m)   Wt 237 lb (107.5 kg)   LMP  (LMP Unknown)   SpO2 100%   BMI 39.44 kg/m    BP Readings from Last 3 Encounters:  12/11/20 (!) 142/98  09/27/20 (!) 154/89  08/23/20 (!) 145/87     Wt Readings from Last 3 Encounters:  12/11/20 237 lb (107.5 kg)  11/29/20 237 lb (107.5 kg)  09/27/20 233 lb 2.2 oz (105.7 kg)     Physical Exam Pt is alert and oriented PERRL EOMI HEART IS RRR no murmur or rub LCTA no wheezing or rales MUSCULOSKELETAL reveals some paraspinous muscle tenderness but no overt trigger points.  With her standing in a fully erect position she does have pain reproducible with extension at the low back and left and right lateral rotation.  This does reproduce her back pain but does not reproduce her sciatica symptoms.  When she is seated and extension at the left greater than right leg she does get a positive straight leg raise symptoms.  She does have difficulty ambulating and has an antalgic gait but her muscle tone and bulk is good strength appears to be well preserved to the lower extremities.  Labs  No results found for: HGBA1C Lab Results  Component Value Date   CREATININE 1.18 (H) 03/15/2020     -------------------------------------------------------------------------------------------------------------------- Lab Results  Component Value Date   WBC 8.0 09/27/2020   HGB 15.0 09/27/2020   HCT 45.0 09/27/2020   PLT 253 09/27/2020   GLUCOSE 115 (H) 03/15/2020   AST 49 (H) 10/28/2017   NA 136 03/15/2020   K 3.4 (L) 03/15/2020   CL 103 03/15/2020   CREATININE 1.18 (H) 03/15/2020   BUN 11 03/15/2020   CO2 22 03/15/2020    --------------------------------------------------------------------------------------------------------------------- DG PAIN CLINIC C-ARM 1-60 MIN NO REPORT  Result Date: 12/11/2020 Fluoro was used, but no Radiologist interpretation will be provided. Please refer to "NOTES" tab for provider progress note.    Assessment & Plan:   [name] was seen today for back pain.  Diagnoses and all orders for this  visit:  Facet arthritis of lumbar region -     LUMBAR FACET(MEDIAL BRANCH NERVE BLOCK) MBNB; Future  Lumbar spondylosis -     Lumbar Epidural Injection -     LUMBAR FACET(MEDIAL BRANCH NERVE BLOCK) MBNB; Future  DDD (degenerative disc disease), cervical -     Lumbar Epidural Injection  Chronic bilateral low back pain with bilateral sciatica -     Lumbar Epidural Injection -     LUMBAR FACET(MEDIAL BRANCH NERVE BLOCK) MBNB; Future  Spinal stenosis of lumbar region with neurogenic claudication -     Lumbar Epidural Injection  Chronic pain syndrome  Long term current use of opiate analgesic  Other orders -     oxyCODONE (ROXICODONE) 5 MG immediate release tablet; Take 1 tablet (5 mg total) by mouth every 4 (four) hours as needed for severe pain. -     oxyCODONE (ROXICODONE) 5 MG immediate release tablet; Take 1 tablet (5 mg total) by mouth every 4 (four) hours as needed for severe pain. -     triamcinolone acetonide (KENALOG-40) injection 40 mg -     sodium chloride flush (NS) 0.9 % injection 10 mL -     ropivacaine (PF) 2 mg/mL (0.2%)  (NAROPIN) injection 10 mL -     lidocaine (PF) (XYLOCAINE) 1 % injection 5 mL -     iohexol (OMNIPAQUE) 180 MG/ML injection 10 mL        ----------------------------------------------------------------------------------------------------------------------  Problem List Items Addressed This Visit       Unprioritized   Chronic low back pain (Chronic)   Relevant Medications   oxyCODONE (ROXICODONE) 5 MG immediate release tablet (Start on 12/29/2020)   oxyCODONE (ROXICODONE) 5 MG immediate release tablet (Start on 01/27/2021)   Other Relevant Orders   LUMBAR FACET(MEDIAL BRANCH NERVE BLOCK) MBNB   Chronic pain syndrome   Relevant Medications   oxyCODONE (ROXICODONE) 5 MG immediate release tablet (Start on 12/29/2020)   oxyCODONE (ROXICODONE) 5 MG immediate release tablet (Start on 01/27/2021)   DDD (degenerative disc disease), cervical   Relevant Medications   oxyCODONE (ROXICODONE) 5 MG immediate release tablet (Start on 12/29/2020)   oxyCODONE (ROXICODONE) 5 MG immediate release tablet (Start on 01/27/2021)   Long term current use of opiate analgesic   Lumbar spondylosis   Relevant Medications   oxyCODONE (ROXICODONE) 5 MG immediate release tablet (Start on 12/29/2020)   oxyCODONE (ROXICODONE) 5 MG immediate release tablet (Start on 01/27/2021)   Other Relevant Orders   LUMBAR FACET(MEDIAL BRANCH NERVE BLOCK) MBNB   Other Visit Diagnoses     Facet arthritis of lumbar region    -  Primary   Relevant Medications   oxyCODONE (ROXICODONE) 5 MG immediate release tablet (Start on 12/29/2020)   oxyCODONE (ROXICODONE) 5 MG immediate release tablet (Start on 01/27/2021)   triamcinolone acetonide (KENALOG-40) injection 40 mg (Completed)   Other Relevant Orders   LUMBAR FACET(MEDIAL BRANCH NERVE BLOCK) MBNB   Spinal stenosis of lumbar region with neurogenic claudication       Relevant Medications   oxyCODONE (ROXICODONE) 5 MG immediate release tablet (Start on 12/29/2020)   oxyCODONE  (ROXICODONE) 5 MG immediate release tablet (Start on 01/27/2021)   triamcinolone acetonide (KENALOG-40) injection 40 mg (Completed)   ropivacaine (PF) 2 mg/mL (0.2%) (NAROPIN) injection 10 mL (Completed)   lidocaine (PF) (XYLOCAINE) 1 % injection 5 mL (Completed)         ----------------------------------------------------------------------------------------------------------------------  1. Lumbar spondylosis We will proceed with a repeat epidural injection today.  We gone over the risks and benefits of the procedure with her in full detail and all questions were answered.  She has responded favorably to these in the past and she reports significant improvement following her last epidural back in December of last year.  She also had an epidural back in April and has made it up until July August before she got recurrence of the sciatica symptoms.  I want her to continue stretching strengthening exercises as reviewed.  Return to clinic in approximately 2 months for possible facet block if the persistent low back pain continues. - Lumbar Epidural Injection - LUMBAR FACET(MEDIAL BRANCH NERVE BLOCK) MBNB; Future  2. DDD (degenerative disc disease), cervical continue stretching strengthening exercises as reviewed today. - Lumbar Epidural Injection  3. Chronic bilateral low back pain with bilateral sciatica As above - Lumbar Epidural Injection - LUMBAR FACET(MEDIAL BRANCH NERVE BLOCK) MBNB; Future  4. Spinal stenosis of lumbar region with neurogenic claudication As above - Lumbar Epidural Injection  5. Facet arthritis of lumbar region Continue core stretching strengthening - LUMBAR FACET(MEDIAL BRANCH NERVE BLOCK) MBNB; Future  6. Chronic pain syndrome I have reviewed the [facility] practitioner database information is appropriate for refills.  This be done for the next 2 months.  This will be for November and December.  I have reviewed the UDS and it is appropriate.  7. Long term  current use of opiate analgesic As above    ----------------------------------------------------------------------------------------------------------------------  I am having Kirsta R. Nedd maintain her ibuprofen, [place] TENS/EMS Combo Unit, ProAir HFA, montelukast, fluticasone, sertraline, Symbicort, buPROPion, atorvastatin, fluticasone, traZODone, baclofen, pregabalin, oxyCODONE, and oxyCODONE. We administered triamcinolone acetonide, sodium chloride flush, ropivacaine (PF) 2 mg/mL (0.2%), lidocaine (PF), and iohexol. We will continue to administer ropivacaine (PF) 2 mg/mL (0.2%) and lidocaine (PF).   Meds ordered this encounter  Medications   oxyCODONE (ROXICODONE) 5 MG immediate release tablet    Sig: Take 1 tablet (5 mg total) by mouth every 4 (four) hours as needed for severe pain.    Dispense:  150 tablet    Refill:  0   oxyCODONE (ROXICODONE) 5 MG immediate release tablet    Sig: Take 1 tablet (5 mg total) by mouth every 4 (four) hours as needed for severe pain.    Dispense:  150 tablet    Refill:  0   triamcinolone acetonide (KENALOG-40) injection 40 mg   sodium chloride flush (NS) 0.9 % injection 10 mL   ropivacaine (PF) 2 mg/mL (0.2%) (NAROPIN) injection 10 mL   lidocaine (PF) (XYLOCAINE) 1 % injection 5 mL   iohexol (OMNIPAQUE) 180 MG/ML injection 10 mL   Patient's Medications  New Prescriptions   No medications on file  Previous Medications   ATORVASTATIN (LIPITOR) 10 MG TABLET    Take 10 mg by mouth daily.   BACLOFEN (LIORESAL) 10 MG TABLET    Take 1 tablet (10 mg total) by mouth 2 (two) times daily.   BUPROPION (WELLBUTRIN SR) 150 MG 12 HR TABLET    Take 150 mg by mouth daily as needed.   FLUTICASONE (FLONASE) 50 MCG/ACT NASAL SPRAY    Place into the nose.   FLUTICASONE (FLONASE) 50 MCG/ACT NASAL SPRAY    Place into the nose.   IBUPROFEN (ADVIL,MOTRIN) 200 MG TABLET    Take 200 mg by mouth every 6 (six) hours as needed.   MONTELUKAST (SINGULAIR) 10 MG TABLET     Take 10 mg by mouth at bedtime.   NERVE STIMULATOR (ZEWA  TENS/EMS COMBO UNIT) DEVI    by Does not apply route.   PREGABALIN (LYRICA) 75 MG CAPSULE    Take 2 capsules (150 mg total) by mouth 2 (two) times daily.   PROAIR HFA 108 (90 BASE) MCG/ACT INHALER    Inhale 2 puffs into the lungs as needed.   SERTRALINE (ZOLOFT) 100 MG TABLET    Take 200 mg by mouth at bedtime.    SYMBICORT 80-4.5 MCG/ACT INHALER    Inhale 2 puffs into the lungs 2 (two) times daily.   TRAZODONE (DESYREL) 50 MG TABLET    Take 50 mg by mouth at bedtime.  Modified Medications   Modified Medication Previous Medication   OXYCODONE (ROXICODONE) 5 MG IMMEDIATE RELEASE TABLET oxyCODONE (ROXICODONE) 5 MG immediate release tablet      Take 1 tablet (5 mg total) by mouth every 4 (four) hours as needed for severe pain.    Take 1 tablet (5 mg total) by mouth every 4 (four) hours as needed for severe pain.   OXYCODONE (ROXICODONE) 5 MG IMMEDIATE RELEASE TABLET oxyCODONE (ROXICODONE) 5 MG immediate release tablet      Take 1 tablet (5 mg total) by mouth every 4 (four) hours as needed for severe pain.    Take 1 tablet (5 mg total) by mouth every 4 (four) hours as needed for severe pain.  Discontinued Medications   No medications on file   ----------------------------------------------------------------------------------------------------------------------  Follow-up: Return in about 2 months (around 02/10/2021) for procedure, evaluation, med refill.   Procedure: L5-S1 LESI with fluoroscopic guidance and no moderate sedation  NOTE: The risks, benefits, and expectations of the procedure have been discussed and explained to the patient who was understanding and in agreement with suggested treatment plan. No guarantees were made.  DESCRIPTION OF PROCEDURE: Lumbar epidural steroid injection with 0 cc IV Versed, EKG, blood pressure, pulse, and pulse oximetry monitoring. The procedure was performed with the patient in the prone position under  fluoroscopic guidance.  Sterile prep x3 was initiated and I then injected subcutaneous lidocaine to the overlying L5-S1 site after its fluoroscopic identifictation.  Using strict aseptic technique, I then advanced an 18-gauge Tuohy epidural needle in the midline using interlaminar approach via loss-of-resistance to saline technique. There was negative aspiration for heme or  CSF.  I then confirmed position with both AP and Lateral fluoroscan.  2 cc of contrast dye were injected and a  total of 5 mL of Preservative-Free normal saline mixed with 40 mg of Kenalog and 1cc Ropicaine 0.2 percent were injected incrementally via the  epidurally placed needle. The needle was removed. The patient tolerated the injection well and was convalesced and discharged to home in stable condition. Should the patient have any post procedure difficulty they have been instructed on how to contact us for assistance.    Molli Barrows, MD

## 2020-12-12 NOTE — Telephone Encounter (Signed)
She had a LESI yesterday and Dr. Andree Elk ordered for a Facet block in December. In order to satisfy insurance to get auth for the facets, I had to schedule her a VV to discuss results from the procedure yesterday. 80 % relief needs to be documented in her visit.

## 2020-12-19 NOTE — Progress Notes (Signed)
Please call patient and let them  know their  low dose Ct was read as a Lung RADS 2: nodules that are benign in appearance and behavior with a very low likelihood of becoming a clinically active cancer due to size or lack of growth. Recommendation per radiology is for a repeat LDCT in 12 months. .Please let them  know we will order and schedule their  annual screening scan for 11/2021. Please let them  know there was notation of CAD on their  scan.  Please remind the patient  that this is a non-gated exam therefore degree or severity of disease  cannot be determined. Please have them  follow up with their PCP regarding potential risk factor modification, dietary therapy or pharmacologic therapy if clinically indicated. Pt.  is  currently on statin therapy. Please place order for annual  screening scan for  11/2021 and fax results to PCP. Thanks so much.  Please let her know there was  notation of a mild change in the common bile duct for her age. Ask her if she is having any right upper quadrant pain . If she is have her follow up with her PCP for recommendations regarding further evaluation.Thanks so much

## 2020-12-20 ENCOUNTER — Other Ambulatory Visit: Payer: Self-pay | Admitting: *Deleted

## 2020-12-20 DIAGNOSIS — F17210 Nicotine dependence, cigarettes, uncomplicated: Secondary | ICD-10-CM

## 2020-12-20 DIAGNOSIS — Z87891 Personal history of nicotine dependence: Secondary | ICD-10-CM

## 2020-12-21 ENCOUNTER — Other Ambulatory Visit (HOSPITAL_BASED_OUTPATIENT_CLINIC_OR_DEPARTMENT_OTHER): Payer: Self-pay | Admitting: Family Medicine

## 2020-12-21 ENCOUNTER — Other Ambulatory Visit: Payer: Self-pay | Admitting: Family Medicine

## 2020-12-21 DIAGNOSIS — R1012 Left upper quadrant pain: Secondary | ICD-10-CM

## 2020-12-21 DIAGNOSIS — K838 Other specified diseases of biliary tract: Secondary | ICD-10-CM

## 2020-12-21 DIAGNOSIS — R1011 Right upper quadrant pain: Secondary | ICD-10-CM

## 2020-12-28 ENCOUNTER — Encounter: Payer: Self-pay | Admitting: Anesthesiology

## 2020-12-28 ENCOUNTER — Ambulatory Visit: Payer: Medicaid Other | Attending: Anesthesiology | Admitting: Anesthesiology

## 2020-12-28 ENCOUNTER — Other Ambulatory Visit: Payer: Self-pay

## 2020-12-28 DIAGNOSIS — M542 Cervicalgia: Secondary | ICD-10-CM

## 2020-12-28 DIAGNOSIS — M48062 Spinal stenosis, lumbar region with neurogenic claudication: Secondary | ICD-10-CM | POA: Diagnosis not present

## 2020-12-28 DIAGNOSIS — M5030 Other cervical disc degeneration, unspecified cervical region: Secondary | ICD-10-CM | POA: Diagnosis not present

## 2020-12-28 DIAGNOSIS — M5442 Lumbago with sciatica, left side: Secondary | ICD-10-CM

## 2020-12-28 DIAGNOSIS — M47816 Spondylosis without myelopathy or radiculopathy, lumbar region: Secondary | ICD-10-CM | POA: Diagnosis not present

## 2020-12-28 DIAGNOSIS — G894 Chronic pain syndrome: Secondary | ICD-10-CM

## 2020-12-28 DIAGNOSIS — M5441 Lumbago with sciatica, right side: Secondary | ICD-10-CM

## 2020-12-28 DIAGNOSIS — M545 Low back pain, unspecified: Secondary | ICD-10-CM

## 2020-12-28 DIAGNOSIS — Z79891 Long term (current) use of opiate analgesic: Secondary | ICD-10-CM

## 2020-12-28 DIAGNOSIS — G8929 Other chronic pain: Secondary | ICD-10-CM

## 2020-12-28 NOTE — Progress Notes (Addendum)
Virtual Visit via Telephone Note  I connected with [name] on 12/28/20 at  1:40 PM EDT by telephone and verified that I am speaking with the correct person using two identifiers.  Location: Patient: Home Provider: Pain control center   I discussed the limitations, risks, security and privacy concerns of performing an evaluation and management service by telephone and the availability of in person appointments. I also discussed with the patient that there may be a patient responsible charge related to this service. The patient expressed understanding and agreed to proceed.   History of Present Illness: I spoke with [name] today via telephone.  We were unable to link for the video portion of the virtual conference.  She reports that she did very well following her epidural injection in October.  She gets 75 to 90% relief in her low back and her leg pain was better by about 90% as well.  She still doing well she reports.  She generally gets about 2 months and occasionally 3 months of relief before she gets recurrence of the same type low back pain and sciatica symptoms.  Unfortunately nothing else has worked well for her.  She has tried physical therapy with no significant improvement as well as exercising and efforts at weight loss.  She takes her opioid medications as prescribed and these generally give her some relief.  The last about 4 to 6 hours before she gets her same baseline breakthrough pain.  She continues to derive good functional benefit from them but continues to breakthrough.  She is requesting a repeat epidural in approximately 1 month if possible.  Otherwise she is in her usual state of health.  No changes in lower extremity strength or function of bowel or bladder function are noted at this time.  Review of systems: General: No fevers or chills Pulmonary: No shortness of breath or dyspnea Cardiac: No angina or palpitations or lightheadedness GI: No abdominal pain or  constipation Psych: No depression    Observations/Objective:  Current Outpatient Medications:    atorvastatin (LIPITOR) 10 MG tablet, Take 10 mg by mouth daily., Disp: , Rfl:    baclofen (LIORESAL) 10 MG tablet, Take 1 tablet (10 mg total) by mouth 2 (two) times daily., Disp: 30 each, Rfl: 4   buPROPion (WELLBUTRIN SR) 150 MG 12 hr tablet, Take 150 mg by mouth daily as needed., Disp: , Rfl:    fluticasone (FLONASE) 50 MCG/ACT nasal spray, Place into the nose. (Patient not taking: No sig reported), Disp: , Rfl:    fluticasone (FLONASE) 50 MCG/ACT nasal spray, Place into the nose. (Patient not taking: No sig reported), Disp: , Rfl:    ibuprofen (ADVIL,MOTRIN) 200 MG tablet, Take 200 mg by mouth every 6 (six) hours as needed. (Patient not taking: No sig reported), Disp: , Rfl:    montelukast (SINGULAIR) 10 MG tablet, Take 10 mg by mouth at bedtime. (Patient not taking: No sig reported), Disp: , Rfl:    Nerve Stimulator (ZEWA TENS/EMS COMBO UNIT) DEVI, by Does not apply route. (Patient not taking: No sig reported), Disp: , Rfl:    [START ON 12/29/2020] oxyCODONE (ROXICODONE) 5 MG immediate release tablet, Take 1 tablet (5 mg total) by mouth every 4 (four) hours as needed for severe pain., Disp: 150 tablet, Rfl: 0   [START ON 01/27/2021] oxyCODONE (ROXICODONE) 5 MG immediate release tablet, Take 1 tablet (5 mg total) by mouth every 4 (four) hours as needed for severe pain., Disp: 150 tablet, Rfl:  0   pregabalin (LYRICA) 75 MG capsule, Take 2 capsules (150 mg total) by mouth 2 (two) times daily., Disp: 120 capsule, Rfl: 3   PROAIR HFA 108 (90 Base) MCG/ACT inhaler, Inhale 2 puffs into the lungs as needed., Disp: , Rfl:    sertraline (ZOLOFT) 100 MG tablet, Take 200 mg by mouth at bedtime. , Disp: , Rfl:    SYMBICORT 80-4.5 MCG/ACT inhaler, Inhale 2 puffs into the lungs 2 (two) times daily. (Patient not taking: No sig reported), Disp: , Rfl:    traZODone (DESYREL) 50 MG tablet, Take 50 mg by mouth at  bedtime., Disp: , Rfl:   Current Facility-Administered Medications:    lidocaine (PF) (XYLOCAINE) 1 % injection 5 mL, 5 mL, Subcutaneous, Once, Keeshawn Fakhouri, Alvina Filbert, MD   ropivacaine (PF) 2 mg/mL (0.2%) (NAROPIN) injection 10 mL, 10 mL, Epidural, Once, Andree Elk Alvina Filbert, MD   Assessment and Plan: 1. Lumbar spondylosis   2. DDD (degenerative disc disease), cervical   3. Chronic bilateral low back pain with bilateral sciatica   4. Spinal stenosis of lumbar region with neurogenic claudication   5. Facet arthritis of lumbar region   6. Chronic pain syndrome   7. Long term current use of opiate analgesic   8. Cervicalgia   9. Chronic bilateral low back pain without sciatica   10. Chronic neck pain   Based on our discussion today I think she would be appropriate candidate for repeat epidural.  She had previous epidurals back in April and October this year and has responded very favorably generally getting 75% relief lasting 2 months before she has recurrence of a similar recurrent type pain.  Nothing else has worked well for her. She has tried home PT with no success and continues to have severe pain rated between a vas 7 and 10 without intermittent LESIs.   The pain has otherwise been quite recalcitrant but she does get good relief from the epidurals.  She generally needs these approximately every 2 to 3 months.  She is also responding favorably to the opioid medications and more conservative medication therapy has failed.  She denies any side effects with her meds.  I have reviewed the [facility] practitioner database information and I think it is appropriate for refills for this next 2 months.  I encouraged her to continue stretching strengthening exercises and follow-up in 1 month for a scheduled epidural steroid injection.  She will likely need these approximately 3-4 times per year going forward.  She is considered a nonsurgical candidate.  Follow Up Instructions:    I discussed the assessment and  treatment plan with the patient. The patient was provided an opportunity to ask questions and all were answered. The patient agreed with the plan and demonstrated an understanding of the instructions.   The patient was advised to call back or seek an in-person evaluation if the symptoms worsen or if the condition fails to improve as anticipated.  I provided 30 minutes of non-face-to-face time during this encounter.   Molli Barrows, MD

## 2021-01-04 ENCOUNTER — Ambulatory Visit
Admission: RE | Admit: 2021-01-04 | Discharge: 2021-01-04 | Disposition: A | Discharge status: Home / Self Care | Payer: Medicaid Other | Source: Ambulatory Visit | Attending: Family Medicine | Admitting: Family Medicine

## 2021-01-04 ENCOUNTER — Other Ambulatory Visit: Payer: Self-pay

## 2021-01-04 DIAGNOSIS — K838 Other specified diseases of biliary tract: Secondary | ICD-10-CM | POA: Diagnosis present

## 2021-01-04 DIAGNOSIS — R1011 Right upper quadrant pain: Secondary | ICD-10-CM

## 2021-01-04 DIAGNOSIS — R1012 Left upper quadrant pain: Secondary | ICD-10-CM | POA: Diagnosis present

## 2021-01-15 ENCOUNTER — Telehealth: Payer: Self-pay

## 2021-01-15 NOTE — Telephone Encounter (Signed)
Insurance denied auth for the lesi

## 2021-01-31 ENCOUNTER — Telehealth: Payer: Self-pay

## 2021-01-31 NOTE — Telephone Encounter (Signed)
I have put a note on his desk letting him know about this in detail.

## 2021-01-31 NOTE — Telephone Encounter (Signed)
Here is what I need to get the lesi authorized. I need Dr. Andree Elk to write a note stating that the patient has a pain score of 6 or above. I need him to write that the patient is trying physician directed home exercise program but is unsuccessful at this due to pain. Please pass this on, she is supposed to come in for the procedure on 12/20 but it was denied so I am trying again, but I need this documentation.

## 2021-02-06 NOTE — Telephone Encounter (Signed)
2nd note done and put on his desk.

## 2021-02-06 NOTE — Telephone Encounter (Signed)
Can you give Dr. Andree Elk a reminder to do this today? I need to submit it again, she appt is coming up really soon and she wants the procedure.

## 2021-02-13 ENCOUNTER — Ambulatory Visit: Payer: Medicaid Other | Attending: Anesthesiology | Admitting: Anesthesiology

## 2021-02-13 ENCOUNTER — Encounter: Payer: Self-pay | Admitting: Anesthesiology

## 2021-02-13 ENCOUNTER — Other Ambulatory Visit: Payer: Self-pay

## 2021-02-13 DIAGNOSIS — Z79891 Long term (current) use of opiate analgesic: Secondary | ICD-10-CM

## 2021-02-13 DIAGNOSIS — M48062 Spinal stenosis, lumbar region with neurogenic claudication: Secondary | ICD-10-CM

## 2021-02-13 DIAGNOSIS — M542 Cervicalgia: Secondary | ICD-10-CM

## 2021-02-13 DIAGNOSIS — M5441 Lumbago with sciatica, right side: Secondary | ICD-10-CM

## 2021-02-13 DIAGNOSIS — M47816 Spondylosis without myelopathy or radiculopathy, lumbar region: Secondary | ICD-10-CM

## 2021-02-13 DIAGNOSIS — M5431 Sciatica, right side: Secondary | ICD-10-CM

## 2021-02-13 DIAGNOSIS — M5030 Other cervical disc degeneration, unspecified cervical region: Secondary | ICD-10-CM

## 2021-02-13 DIAGNOSIS — G894 Chronic pain syndrome: Secondary | ICD-10-CM

## 2021-02-13 DIAGNOSIS — M5442 Lumbago with sciatica, left side: Secondary | ICD-10-CM | POA: Diagnosis not present

## 2021-02-13 DIAGNOSIS — G8929 Other chronic pain: Secondary | ICD-10-CM

## 2021-02-13 DIAGNOSIS — M545 Low back pain, unspecified: Secondary | ICD-10-CM

## 2021-02-13 MED ORDER — OXYCODONE HCL 5 MG PO TABS: 5.0000 mg | ORAL_TABLET | ORAL | 0 refills | Status: DC | PRN

## 2021-02-13 MED ORDER — OXYCODONE HCL 5 MG PO TABS
5.0000 mg | ORAL_TABLET | ORAL | 0 refills | Status: DC | PRN
Start: 2021-02-24 — End: 2021-03-26

## 2021-02-13 NOTE — Progress Notes (Signed)
Virtual Visit via Telephone Note  I connected with Hannah Maynard on 02/13/21 at  2:45 PM EST by telephone and verified that I am speaking with the correct person using two identifiers.  Location: Patient: Home  Provider: Pain control center   I discussed the limitations, risks, security and privacy concerns of performing an evaluation and management service by telephone and the availability of in person appointments. I also discussed with the patient that there may be a patient responsible charge related to this service. The patient expressed understanding and agreed to proceed.   History of Present Illness: I spoke with Hannah Maynard today via telephone as she is unable to do the video portion.  She reports that she is continuing to struggle with her low back pain and sciatica symptoms.  Unfortunately she has failed more conservative therapy and has gone through prescribed physical therapy and has done her home exercises which have been physician directed without success.  Despite these efforts her pain score remains above a 6 and she has difficulty getting relief.  She takes her oxycodone 5 mg approximately 5 times a day and gets relief from this but still has significant discomfort.  It affects how she ambulates and limits her activity and impairs her sleep at night.  Fortunately for her she has had success with epidurals.  She generally gets 75% improvement lasting 6 to 8 weeks before she has recurrence of her baseline pain.  She has been deemed a nonsurgical candidate and continues to struggle with her capacity for activity and ability to lose weight.  Otherwise she denies any change in the quality characteristic or distribution of her pain.  She is tolerating medications without difficulty no side effects are reported and she gets good relief with these.  Review of systems: General: No fevers or chills Pulmonary: No shortness of breath or dyspnea Cardiac: No angina or palpitations or  lightheadedness GI: No abdominal pain or constipation Psych: No depression    Observations/Objective:  Current Outpatient Medications:    atorvastatin (LIPITOR) 10 MG tablet, Take 10 mg by mouth daily., Disp: , Rfl:    buPROPion (WELLBUTRIN SR) 150 MG 12 hr tablet, Take 150 mg by mouth daily as needed., Disp: , Rfl:    fluticasone (FLONASE) 50 MCG/ACT nasal spray, Place into the nose. (Patient not taking: No sig reported), Disp: , Rfl:    fluticasone (FLONASE) 50 MCG/ACT nasal spray, Place into the nose. (Patient not taking: No sig reported), Disp: , Rfl:    ibuprofen (ADVIL,MOTRIN) 200 MG tablet, Take 200 mg by mouth every 6 (six) hours as needed. (Patient not taking: No sig reported), Disp: , Rfl:    montelukast (SINGULAIR) 10 MG tablet, Take 10 mg by mouth at bedtime. (Patient not taking: No sig reported), Disp: , Rfl:    Nerve Stimulator (ZEWA TENS/EMS COMBO UNIT) DEVI, by Does not apply route. (Patient not taking: No sig reported), Disp: , Rfl:    oxyCODONE (ROXICODONE) 5 MG immediate release tablet, Take 1 tablet (5 mg total) by mouth every 4 (four) hours as needed for severe pain., Disp: 150 tablet, Rfl: 0   pregabalin (LYRICA) 75 MG capsule, Take 2 capsules (150 mg total) by mouth 2 (two) times daily., Disp: 120 capsule, Rfl: 3   PROAIR HFA 108 (90 Base) MCG/ACT inhaler, Inhale 2 puffs into the lungs as needed., Disp: , Rfl:    sertraline (ZOLOFT) 100 MG tablet, Take 200 mg by mouth at bedtime. , Disp: , Rfl:  SYMBICORT 80-4.5 MCG/ACT inhaler, Inhale 2 puffs into the lungs 2 (two) times daily. (Patient not taking: No sig reported), Disp: , Rfl:    traZODone (DESYREL) 50 MG tablet, Take 50 mg by mouth at bedtime., Disp: , Rfl:   Current Facility-Administered Medications:    lidocaine (PF) (XYLOCAINE) 1 % injection 5 mL, 5 mL, Subcutaneous, Once, Gerilynn Mccullars, Alvina Filbert, MD   ropivacaine (PF) 2 mg/mL (0.2%) (NAROPIN) injection 10 mL, 10 mL, Epidural, Once, Andree Elk Alvina Filbert, MD   Assessment and  Plan: 1. Lumbar spondylosis   2. Chronic bilateral low back pain with bilateral sciatica   3. DDD (degenerative disc disease), cervical   4. Spinal stenosis of lumbar region with neurogenic claudication   5. Facet arthritis of lumbar region   6. Chronic pain syndrome   7. Long term current use of opiate analgesic   8. Cervicalgia   9. Chronic bilateral low back pain without sciatica   10. Chronic neck pain   11. Right sided sciatica   Based on our discussion today and after review of the [facility] practitioner database information I think it is appropriate to refill her medications for the next 2 months and will take these for December 31 and January 30.  No changes in her pharmacologic regimen will be initiated.  I am hopeful that we can get her insurance company to allow her an epidural as these have given her good successful relief in the past where she has failed more conservative therapy and has not gained relief from conservative care with stretching strengthening or physical therapy.  In the meantime I have encouraged her to continue with these modalities and we will plan on return to clinic in 2 months.  I encouraged her to continue follow-up with her primary care physician for baseline medical care and will continue to work with her insurance company to see if we can company to see if we can get her covered for an epidural.  Follow Up Instructions:    I discussed the assessment and treatment plan with the patient. The patient was provided an opportunity to ask questions and all were answered. The patient agreed with the plan and demonstrated an understanding of the instructions.   The patient was advised to call back or seek an in-person evaluation if the symptoms worsen or if the condition fails to improve as anticipated.  I provided 30 minutes of non-face-to-face time during this encounter.   Molli Barrows, MD

## 2021-02-13 NOTE — Patient Instructions (Signed)
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
## 2021-03-18 ENCOUNTER — Other Ambulatory Visit: Payer: Self-pay | Admitting: Anesthesiology

## 2021-03-26 ENCOUNTER — Ambulatory Visit (HOSPITAL_BASED_OUTPATIENT_CLINIC_OR_DEPARTMENT_OTHER): Payer: Medicaid Other | Admitting: Anesthesiology

## 2021-03-26 ENCOUNTER — Other Ambulatory Visit: Payer: Self-pay | Admitting: Anesthesiology

## 2021-03-26 ENCOUNTER — Ambulatory Visit
Admission: RE | Admit: 2021-03-26 | Discharge: 2021-03-26 | Disposition: A | Discharge status: Home / Self Care | Payer: Medicaid Other | Source: Ambulatory Visit | Attending: Anesthesiology | Admitting: Anesthesiology

## 2021-03-26 ENCOUNTER — Other Ambulatory Visit: Payer: Self-pay

## 2021-03-26 ENCOUNTER — Encounter: Payer: Self-pay | Admitting: Anesthesiology

## 2021-03-26 VITALS — BP 164/100 | HR 77 | Temp 97.8°F | Resp 15 | Ht 65.0 in | Wt 237.0 lb

## 2021-03-26 DIAGNOSIS — M48062 Spinal stenosis, lumbar region with neurogenic claudication: Secondary | ICD-10-CM | POA: Diagnosis present

## 2021-03-26 DIAGNOSIS — M5431 Sciatica, right side: Secondary | ICD-10-CM

## 2021-03-26 DIAGNOSIS — M5441 Lumbago with sciatica, right side: Secondary | ICD-10-CM

## 2021-03-26 DIAGNOSIS — Z79891 Long term (current) use of opiate analgesic: Secondary | ICD-10-CM

## 2021-03-26 DIAGNOSIS — M5442 Lumbago with sciatica, left side: Secondary | ICD-10-CM

## 2021-03-26 DIAGNOSIS — R52 Pain, unspecified: Secondary | ICD-10-CM | POA: Insufficient documentation

## 2021-03-26 DIAGNOSIS — M5030 Other cervical disc degeneration, unspecified cervical region: Secondary | ICD-10-CM | POA: Insufficient documentation

## 2021-03-26 DIAGNOSIS — M47816 Spondylosis without myelopathy or radiculopathy, lumbar region: Secondary | ICD-10-CM | POA: Insufficient documentation

## 2021-03-26 DIAGNOSIS — G894 Chronic pain syndrome: Secondary | ICD-10-CM | POA: Diagnosis present

## 2021-03-26 DIAGNOSIS — G8929 Other chronic pain: Secondary | ICD-10-CM | POA: Diagnosis present

## 2021-03-26 MED ORDER — SODIUM CHLORIDE 0.9% FLUSH
10.0000 mL | Freq: Once | INTRAVENOUS | Status: AC
  Administered 2021-03-26: 10 mL

## 2021-03-26 MED ORDER — ROPIVACAINE HCL 2 MG/ML IJ SOLN
10.0000 mL | Freq: Once | INTRAMUSCULAR | Status: AC
  Administered 2021-03-26: 1 mL via EPIDURAL

## 2021-03-26 MED ORDER — LIDOCAINE HCL (PF) 1 % IJ SOLN
5.0000 mL | Freq: Once | INTRAMUSCULAR | Status: AC
  Administered 2021-03-26: 5 mL via SUBCUTANEOUS

## 2021-03-26 MED ORDER — LIDOCAINE HCL (PF) 1 % IJ SOLN
INTRAMUSCULAR | Status: AC
  Filled 2021-03-26: qty 5

## 2021-03-26 MED ORDER — TRIAMCINOLONE ACETONIDE 40 MG/ML IJ SUSP
40.0000 mg | Freq: Once | INTRAMUSCULAR | Status: AC
  Administered 2021-03-26: 40 mg

## 2021-03-26 MED ORDER — OXYCODONE HCL 5 MG PO TABS: 5.0000 mg | ORAL_TABLET | ORAL | 0 refills | Status: AC | PRN

## 2021-03-26 MED ORDER — ROPIVACAINE HCL 2 MG/ML IJ SOLN
INTRAMUSCULAR | Status: AC
  Filled 2021-03-26: qty 20

## 2021-03-26 MED ORDER — IOPAMIDOL (ISOVUE-M 200) INJECTION 41%: 20.0000 mL | Freq: Once | INTRAMUSCULAR | Status: DC | PRN

## 2021-03-26 MED ORDER — SODIUM CHLORIDE (PF) 0.9 % IJ SOLN
INTRAMUSCULAR | Status: AC
  Filled 2021-03-26: qty 10

## 2021-03-26 MED ORDER — TRIAMCINOLONE ACETONIDE 40 MG/ML IJ SUSP
INTRAMUSCULAR | Status: AC
  Filled 2021-03-26: qty 1

## 2021-03-26 NOTE — Patient Instructions (Signed)

## 2021-03-26 NOTE — Progress Notes (Signed)
Safety precautions to be maintained throughout the outpatient stay will include: orient to surroundings, keep bed in low position, maintain call bell within reach at all times, provide assistance with transfer out of bed and ambulation.  

## 2021-04-01 NOTE — Progress Notes (Signed)
Subjective:  Patient ID: Hannah Maynard, female    DOB: May 05, 1965  Age: 56 y.o. MRN: [id number]  CC: Back Pain (Lumbar bilateral )   Procedure: L5-S1 epidural steroid under fluoroscopic guidance with no sedation  HPI Hannah Maynard presents for reevaluation.  Hannah Maynard continues to have intractable low back pain with bilateral lower extremity sciatica symptoms.  She responds favorably to epidural steroids done periodically to keep the symptoms under control.  She has failed conservative therapy with medication management alone.  She has not gained any significant improvement with formal physical therapy or home remedies.  When she has epidural she gets about 75% relief in her low back and 100% relief of her sciatica lasting about 2 months before she gets recurrence of a similar scenario.  Unfortunately it is recurrent but it is responsive to epidural steroid injections.  No other modality has been of significant relief and she is a nonsurgical candidate.  She continues to take her opioid medications actively and these do give her relief but additionally requires the intermittent epidural steroid injections to keep her pain manageable.  Otherwise she is denying any change in lower extremity strength function or bowel or bladder function.  Outpatient Medications Prior to Visit  Medication Sig Dispense Refill   atorvastatin (LIPITOR) 10 MG tablet Take 10 mg by mouth daily.     baclofen (LIORESAL) 10 MG tablet Take 10 mg by mouth 2 (two) times daily.     buPROPion (WELLBUTRIN SR) 150 MG 12 hr tablet Take 150 mg by mouth daily as needed.     fluticasone (FLONASE) 50 MCG/ACT nasal spray Place into the nose.     montelukast (SINGULAIR) 10 MG tablet Take 10 mg by mouth at bedtime.     Nerve Stimulator (ZEWA TENS/EMS COMBO UNIT) DEVI by Does not apply route.     pregabalin (LYRICA) 75 MG capsule Take 2 capsules (150 mg total) by mouth 2 (two) times daily. 120 capsule 3   PROAIR HFA 108 (90 Base)  MCG/ACT inhaler Inhale 2 puffs into the lungs as needed.     sertraline (ZOLOFT) 100 MG tablet Take 200 mg by mouth at bedtime.      SYMBICORT 80-4.5 MCG/ACT inhaler Inhale 2 puffs into the lungs 2 (two) times daily.     traZODone (DESYREL) 50 MG tablet Take 50 mg by mouth at bedtime.     triamcinolone cream (KENALOG) 0.1 % Apply 1 application topically 3 (three) times daily as needed.     oxyCODONE (ROXICODONE) 5 MG immediate release tablet Take 1 tablet (5 mg total) by mouth every 4 (four) hours as needed for severe pain. 150 tablet 0   oxyCODONE (ROXICODONE) 5 MG immediate release tablet Take 1 tablet (5 mg total) by mouth every 4 (four) hours as needed for severe pain. 150 tablet 0   fluticasone (FLONASE) 50 MCG/ACT nasal spray Place into the nose. (Patient not taking: No sig reported)     ibuprofen (ADVIL,MOTRIN) 200 MG tablet Take 200 mg by mouth every 6 (six) hours as needed. (Patient not taking: Reported on 09/27/2020)     methylPREDNISolone (MEDROL DOSEPAK) 4 MG TBPK tablet Take 1 tablet by mouth as directed. (Patient not taking: Reported on 03/26/2021)     Facility-Administered Medications Prior to Visit  Medication Dose Route Frequency Provider Last Rate Last Admin   lidocaine (PF) (XYLOCAINE) 1 % injection 5 mL  5 mL Subcutaneous Once Molli Barrows, MD  ropivacaine (PF) 2 mg/mL (0.2%) (NAROPIN) injection 10 mL  10 mL Epidural Once Molli Barrows, MD        Review of Systems CNS: No confusion or sedation Cardiac: No angina or palpitations GI: No abdominal pain or constipation Constitutional: No nausea vomiting fevers or chills  Objective:  BP (!) 164/100    Pulse 77    Temp 97.8 F (36.6 C) (Temporal)    Resp 15    Ht 5\' 5"  (1.651 m)    Wt 237 lb (107.5 kg)    LMP  (LMP Unknown)    SpO2 97%    BMI 39.44 kg/m    BP Readings from Last 3 Encounters:  03/26/21 (!) 164/100  12/11/20 (!) 142/98  09/27/20 (!) 154/89     Wt Readings from Last 3 Encounters:  03/26/21 237 lb  (107.5 kg)  12/11/20 237 lb (107.5 kg)  11/29/20 237 lb (107.5 kg)     Physical Exam Pt is alert and oriented PERRL EOMI HEART IS RRR no murmur or rub LCTA no wheezing or rales MUSCULOSKELETAL reveals some paraspinous muscle tenderness but no overt trigger points.  She has a positive straight leg raise on the left equivocal on the right her muscle tone and bulk is at baseline.  Labs  No results found for: HGBA1C Lab Results  Component Value Date   CREATININE 1.18 (H) 03/15/2020    -------------------------------------------------------------------------------------------------------------------- Lab Results  Component Value Date   WBC 8.0 09/27/2020   HGB 15.0 09/27/2020   HCT 45.0 09/27/2020   PLT 253 09/27/2020   GLUCOSE 115 (H) 03/15/2020   AST 49 (H) 10/28/2017   NA 136 03/15/2020   K 3.4 (L) 03/15/2020   CL 103 03/15/2020   CREATININE 1.18 (H) 03/15/2020   BUN 11 03/15/2020   CO2 22 03/15/2020    --------------------------------------------------------------------------------------------------------------------- DG PAIN CLINIC C-ARM 1-60 MIN NO REPORT  Result Date: 03/26/2021 Fluoro was used, but no Radiologist interpretation will be provided. Please refer to "NOTES" tab for provider progress note.    Assessment & Plan:   Hannah Maynard was seen today for back pain.  Diagnoses and all orders for this visit:  Lumbar spondylosis  Chronic bilateral low back pain with bilateral sciatica -     Lumbar Epidural Injection  DDD (degenerative disc disease), cervical -     Lumbar Epidural Injection  Spinal stenosis of lumbar region with neurogenic claudication -     Lumbar Epidural Injection  Right sided sciatica -     Lumbar Epidural Injection  Facet arthritis of lumbar region  Long term current use of opiate analgesic  Chronic pain syndrome  Other orders -     oxyCODONE (ROXICODONE) 5 MG immediate release tablet; Take 1 tablet (5 mg total) by mouth every 4  (four) hours as needed for severe pain. -     oxyCODONE (ROXICODONE) 5 MG immediate release tablet; Take 1 tablet (5 mg total) by mouth every 4 (four) hours as needed for severe pain. -     triamcinolone acetonide (KENALOG-40) injection 40 mg -     sodium chloride flush (NS) 0.9 % injection 10 mL -     ropivacaine (PF) 2 mg/mL (0.2%) (NAROPIN) injection 10 mL -     lidocaine (PF) (XYLOCAINE) 1 % injection 5 mL -     iopamidol (ISOVUE-M) 41 % intrathecal injection 20 mL        ----------------------------------------------------------------------------------------------------------------------  Problem List Items Addressed This Visit  Unprioritized   Chronic low back pain (Chronic)   Relevant Medications   baclofen (LIORESAL) 10 MG tablet   methylPREDNISolone (MEDROL DOSEPAK) 4 MG TBPK tablet   oxyCODONE (ROXICODONE) 5 MG immediate release tablet   oxyCODONE (ROXICODONE) 5 MG immediate release tablet (Start on 04/25/2021)   Chronic pain syndrome   Relevant Medications   baclofen (LIORESAL) 10 MG tablet   methylPREDNISolone (MEDROL DOSEPAK) 4 MG TBPK tablet   oxyCODONE (ROXICODONE) 5 MG immediate release tablet   oxyCODONE (ROXICODONE) 5 MG immediate release tablet (Start on 04/25/2021)   DDD (degenerative disc disease), cervical   Relevant Medications   baclofen (LIORESAL) 10 MG tablet   methylPREDNISolone (MEDROL DOSEPAK) 4 MG TBPK tablet   oxyCODONE (ROXICODONE) 5 MG immediate release tablet   oxyCODONE (ROXICODONE) 5 MG immediate release tablet (Start on 04/25/2021)   Long term current use of opiate analgesic   Lumbar spondylosis - Primary   Relevant Medications   baclofen (LIORESAL) 10 MG tablet   methylPREDNISolone (MEDROL DOSEPAK) 4 MG TBPK tablet   oxyCODONE (ROXICODONE) 5 MG immediate release tablet   oxyCODONE (ROXICODONE) 5 MG immediate release tablet (Start on 04/25/2021)   Other Visit Diagnoses     Spinal stenosis of lumbar region with neurogenic claudication        Relevant Medications   baclofen (LIORESAL) 10 MG tablet   methylPREDNISolone (MEDROL DOSEPAK) 4 MG TBPK tablet   oxyCODONE (ROXICODONE) 5 MG immediate release tablet   oxyCODONE (ROXICODONE) 5 MG immediate release tablet (Start on 04/25/2021)   triamcinolone acetonide (KENALOG-40) injection 40 mg (Completed)   ropivacaine (PF) 2 mg/mL (0.2%) (NAROPIN) injection 10 mL (Completed)   lidocaine (PF) (XYLOCAINE) 1 % injection 5 mL (Completed)   Right sided sciatica       Relevant Medications   baclofen (LIORESAL) 10 MG tablet   Facet arthritis of lumbar region       Relevant Medications   baclofen (LIORESAL) 10 MG tablet   methylPREDNISolone (MEDROL DOSEPAK) 4 MG TBPK tablet   oxyCODONE (ROXICODONE) 5 MG immediate release tablet   oxyCODONE (ROXICODONE) 5 MG immediate release tablet (Start on 04/25/2021)   triamcinolone acetonide (KENALOG-40) injection 40 mg (Completed)         ----------------------------------------------------------------------------------------------------------------------  1. Chronic bilateral low back pain with bilateral sciatica We will proceed with a repeat epidural today.  The risks and benefits were once again reviewed with her in full detail all questions answered.  We will proceed with this today.  I encouraged her to continue efforts at weight loss stretching strengthening and some core strengthening as well.  Continue ambulation and walking.  We will schedule her for return to clinic in approximately 2 months. - Lumbar Epidural Injection  2. DDD (degenerative disc disease), cervical As above - Lumbar Epidural Injection  3. Spinal stenosis of lumbar region with neurogenic claudication As above - Lumbar Epidural Injection  4. Right sided sciatica As above - Lumbar Epidural Injection  5. Lumbar spondylosis   6. Facet arthritis of lumbar region   7. Long term current use of opiate analgesic I have reviewed the [facility] practitioner database  information is appropriate for refill today.  These will be dated for January and March.  She is scheduled for return to clinic in 2 months.  8. Chronic pain syndrome As above    ----------------------------------------------------------------------------------------------------------------------  I am having Hannah Maynard maintain her ibuprofen, [place] TENS/EMS Combo Unit, ProAir HFA, montelukast, fluticasone, sertraline, Symbicort, buPROPion, atorvastatin, fluticasone, traZODone, pregabalin, baclofen,  methylPREDNISolone, triamcinolone cream, oxyCODONE, and oxyCODONE. We administered triamcinolone acetonide, sodium chloride flush, ropivacaine (PF) 2 mg/mL (0.2%), and lidocaine (PF). We will continue to administer ropivacaine (PF) 2 mg/mL (0.2%) and lidocaine (PF).   Meds ordered this encounter  Medications   oxyCODONE (ROXICODONE) 5 MG immediate release tablet    Sig: Take 1 tablet (5 mg total) by mouth every 4 (four) hours as needed for severe pain.    Dispense:  150 tablet    Refill:  0   oxyCODONE (ROXICODONE) 5 MG immediate release tablet    Sig: Take 1 tablet (5 mg total) by mouth every 4 (four) hours as needed for severe pain.    Dispense:  150 tablet    Refill:  0   triamcinolone acetonide (KENALOG-40) injection 40 mg   sodium chloride flush (NS) 0.9 % injection 10 mL   ropivacaine (PF) 2 mg/mL (0.2%) (NAROPIN) injection 10 mL   lidocaine (PF) (XYLOCAINE) 1 % injection 5 mL   iopamidol (ISOVUE-M) 41 % intrathecal injection 20 mL   Patient's Medications  New Prescriptions   No medications on file  Previous Medications   ATORVASTATIN (LIPITOR) 10 MG TABLET    Take 10 mg by mouth daily.   BACLOFEN (LIORESAL) 10 MG TABLET    Take 10 mg by mouth 2 (two) times daily.   BUPROPION (WELLBUTRIN SR) 150 MG 12 HR TABLET    Take 150 mg by mouth daily as needed.   FLUTICASONE (FLONASE) 50 MCG/ACT NASAL SPRAY    Place into the nose.   FLUTICASONE (FLONASE) 50 MCG/ACT NASAL SPRAY     Place into the nose.   IBUPROFEN (ADVIL,MOTRIN) 200 MG TABLET    Take 200 mg by mouth every 6 (six) hours as needed.   METHYLPREDNISOLONE (MEDROL DOSEPAK) 4 MG TBPK TABLET    Take 1 tablet by mouth as directed.   MONTELUKAST (SINGULAIR) 10 MG TABLET    Take 10 mg by mouth at bedtime.   NERVE STIMULATOR (ZEWA TENS/EMS COMBO UNIT) DEVI    by Does not apply route.   PREGABALIN (LYRICA) 75 MG CAPSULE    Take 2 capsules (150 mg total) by mouth 2 (two) times daily.   PROAIR HFA 108 (90 BASE) MCG/ACT INHALER    Inhale 2 puffs into the lungs as needed.   SERTRALINE (ZOLOFT) 100 MG TABLET    Take 200 mg by mouth at bedtime.    SYMBICORT 80-4.5 MCG/ACT INHALER    Inhale 2 puffs into the lungs 2 (two) times daily.   TRAZODONE (DESYREL) 50 MG TABLET    Take 50 mg by mouth at bedtime.   TRIAMCINOLONE CREAM (KENALOG) 0.1 %    Apply 1 application topically 3 (three) times daily as needed.  Modified Medications   Modified Medication Previous Medication   OXYCODONE (ROXICODONE) 5 MG IMMEDIATE RELEASE TABLET oxyCODONE (ROXICODONE) 5 MG immediate release tablet      Take 1 tablet (5 mg total) by mouth every 4 (four) hours as needed for severe pain.    Take 1 tablet (5 mg total) by mouth every 4 (four) hours as needed for severe pain.   OXYCODONE (ROXICODONE) 5 MG IMMEDIATE RELEASE TABLET oxyCODONE (ROXICODONE) 5 MG immediate release tablet      Take 1 tablet (5 mg total) by mouth every 4 (four) hours as needed for severe pain.    Take 1 tablet (5 mg total) by mouth every 4 (four) hours as needed for severe pain.  Discontinued Medications  No medications on file   ----------------------------------------------------------------------------------------------------------------------  Follow-up: Return in about 2 months (around 05/24/2021) for evaluation, med refill.   Procedure: L5-S1 LESI with fluoroscopic guidance and no moderate sedation  NOTE: The risks, benefits, and expectations of the procedure have been  discussed and explained to the patient who was understanding and in agreement with suggested treatment plan. No guarantees were made.  DESCRIPTION OF PROCEDURE: Lumbar epidural steroid injection with no IV Versed, EKG, blood pressure, pulse, and pulse oximetry monitoring. The procedure was performed with the patient in the prone position under fluoroscopic guidance.  Sterile prep x3 was initiated and I then injected subcutaneous lidocaine to the overlying L5-S1 site after its fluoroscopic identifictation.  Using strict aseptic technique, I then advanced an 18-gauge Tuohy epidural needle in the midline using interlaminar approach via loss-of-resistance to saline technique. There was negative aspiration for heme or  CSF.  I then confirmed position with both AP and Lateral fluoroscan.  2 cc of contrast dye were injected and a  total of 5 mL of Preservative-Free normal saline mixed with 40 mg of Kenalog and 1cc Ropicaine 0.2 percent were injected incrementally via the  epidurally placed needle. The needle was removed. The patient tolerated the injection well and was convalesced and discharged to home in stable condition. Should the patient have any post procedure difficulty they have been instructed on how to contact us for assistance.    Molli Barrows, MD

## 2021-04-13 ENCOUNTER — Other Ambulatory Visit: Payer: Self-pay | Admitting: Anesthesiology

## 2021-05-11 ENCOUNTER — Other Ambulatory Visit: Payer: Self-pay | Admitting: Anesthesiology

## 2021-05-21 ENCOUNTER — Other Ambulatory Visit: Payer: Self-pay

## 2021-05-21 ENCOUNTER — Emergency Department: Payer: Medicaid Other

## 2021-05-21 ENCOUNTER — Observation Stay
Admission: EM | Admit: 2021-05-21 | Discharge: 2021-05-22 | Disposition: A | Discharge status: Home / Self Care | Payer: Medicaid Other | Attending: Internal Medicine | Admitting: Internal Medicine

## 2021-05-21 DIAGNOSIS — Z79899 Other long term (current) drug therapy: Secondary | ICD-10-CM | POA: Diagnosis not present

## 2021-05-21 DIAGNOSIS — Z803 Family history of malignant neoplasm of breast: Secondary | ICD-10-CM

## 2021-05-21 DIAGNOSIS — G8921 Chronic pain due to trauma: Secondary | ICD-10-CM | POA: Diagnosis present

## 2021-05-21 DIAGNOSIS — Z6841 Body Mass Index (BMI) 40.0 and over, adult: Secondary | ICD-10-CM | POA: Diagnosis not present

## 2021-05-21 DIAGNOSIS — F17210 Nicotine dependence, cigarettes, uncomplicated: Secondary | ICD-10-CM | POA: Diagnosis present

## 2021-05-21 DIAGNOSIS — Z800 Family history of malignant neoplasm of digestive organs: Secondary | ICD-10-CM

## 2021-05-21 DIAGNOSIS — U071 COVID-19: Secondary | ICD-10-CM | POA: Insufficient documentation

## 2021-05-21 DIAGNOSIS — Z880 Allergy status to penicillin: Secondary | ICD-10-CM | POA: Diagnosis not present

## 2021-05-21 DIAGNOSIS — E876 Hypokalemia: Secondary | ICD-10-CM | POA: Insufficient documentation

## 2021-05-21 DIAGNOSIS — M549 Dorsalgia, unspecified: Secondary | ICD-10-CM | POA: Diagnosis present

## 2021-05-21 DIAGNOSIS — Z8616 Personal history of COVID-19: Secondary | ICD-10-CM | POA: Diagnosis not present

## 2021-05-21 DIAGNOSIS — R638 Other symptoms and signs concerning food and fluid intake: Secondary | ICD-10-CM | POA: Insufficient documentation

## 2021-05-21 DIAGNOSIS — I951 Orthostatic hypotension: Principal | ICD-10-CM | POA: Insufficient documentation

## 2021-05-21 DIAGNOSIS — R55 Syncope and collapse: Secondary | ICD-10-CM | POA: Diagnosis present

## 2021-05-21 DIAGNOSIS — Z7951 Long term (current) use of inhaled steroids: Secondary | ICD-10-CM

## 2021-05-21 DIAGNOSIS — Z888 Allergy status to other drugs, medicaments and biological substances status: Secondary | ICD-10-CM

## 2021-05-21 LAB — BASIC METABOLIC PANEL
Anion gap: 12 (ref 5–15)
BUN: 11 mg/dL (ref 6–20)
CO2: 22 mmol/L (ref 22–32)
Calcium: 8.7 mg/dL — ABNORMAL LOW (ref 8.9–10.3)
Chloride: 102 mmol/L (ref 98–111)
Creatinine, Ser: 1.12 mg/dL — ABNORMAL HIGH (ref 0.44–1.00)
GFR, Estimated: 57 mL/min — ABNORMAL LOW (ref >60)
Glucose, Bld: 114 mg/dL — ABNORMAL HIGH (ref 70–99)
Potassium: 2.9 mmol/L — ABNORMAL LOW (ref 3.5–5.1)
Sodium: 136 mmol/L (ref 135–145)

## 2021-05-21 LAB — URINALYSIS, COMPLETE (UACMP) WITH MICROSCOPIC
Bilirubin Urine: NEGATIVE
Glucose, UA: NEGATIVE mg/dL
Ketones, ur: 20 mg/dL — AB
Leukocytes,Ua: NEGATIVE
Nitrite: NEGATIVE
Protein, ur: 100 mg/dL — AB
Specific Gravity, Urine: 1.023 (ref 1.005–1.030)
pH: 5 (ref 5.0–8.0)

## 2021-05-21 LAB — HEPATIC FUNCTION PANEL
ALT: 21 U/L (ref 0–44)
AST: 27 U/L (ref 15–41)
Albumin: 3.6 g/dL (ref 3.5–5.0)
Alkaline Phosphatase: 78 U/L (ref 38–126)
Bilirubin, Direct: 0.1 mg/dL (ref 0.0–0.2)
Indirect Bilirubin: 0.7 mg/dL (ref 0.3–0.9)
Total Bilirubin: 0.8 mg/dL (ref 0.3–1.2)
Total Protein: 7.5 g/dL (ref 6.5–8.1)

## 2021-05-21 LAB — CBC
HCT: 46.2 % — ABNORMAL HIGH (ref 36.0–46.0)
Hemoglobin: 15 g/dL (ref 12.0–15.0)
MCH: 29.3 pg (ref 26.0–34.0)
MCHC: 32.5 g/dL (ref 30.0–36.0)
MCV: 90.2 fL (ref 80.0–100.0)
Platelets: 193 10*3/uL (ref 150–400)
RBC: 5.12 MIL/uL — ABNORMAL HIGH (ref 3.87–5.11)
RDW: 12.3 % (ref 11.5–15.5)
WBC: 6.3 10*3/uL (ref 4.0–10.5)
nRBC: 0 % (ref 0.0–0.2)

## 2021-05-21 LAB — TROPONIN I (HIGH SENSITIVITY)
Troponin I (High Sensitivity): 22 ng/L — ABNORMAL HIGH (ref <18)
Troponin I (High Sensitivity): 23 ng/L — ABNORMAL HIGH (ref <18)

## 2021-05-21 LAB — RESP PANEL BY RT-PCR (FLU A&B, COVID) ARPGX2
Influenza A by PCR: NEGATIVE
Influenza B by PCR: NEGATIVE
SARS Coronavirus 2 by RT PCR: POSITIVE — AB

## 2021-05-21 LAB — LIPASE, BLOOD: Lipase: 31 U/L (ref 11–51)

## 2021-05-21 LAB — CREATININE, SERUM
Creatinine, Ser: 0.87 mg/dL (ref 0.44–1.00)
GFR, Estimated: >60 mL/min (ref >60)

## 2021-05-21 MED ORDER — ACETAMINOPHEN 650 MG RE SUPP: 650.0000 mg | Freq: Four times a day (QID) | RECTAL | Status: DC | PRN

## 2021-05-21 MED ORDER — PREGABALIN 75 MG PO CAPS
150.0000 mg | ORAL_CAPSULE | Freq: Two times a day (BID) | ORAL | Status: DC
  Administered 2021-05-21: 150 mg via ORAL
  Filled 2021-05-21: qty 2
  Filled 2021-05-21: qty 6

## 2021-05-21 MED ORDER — POTASSIUM CHLORIDE 10 MEQ/100ML IV SOLN
10.0000 meq | INTRAVENOUS | Status: AC
  Administered 2021-05-21 (×2): 10 meq via INTRAVENOUS
  Filled 2021-05-21 (×3): qty 100

## 2021-05-21 MED ORDER — MAGNESIUM SULFATE 2 GM/50ML IV SOLN
2.0000 g | Freq: Once | INTRAVENOUS | Status: AC
  Administered 2021-05-21: 2 g via INTRAVENOUS
  Filled 2021-05-21: qty 50

## 2021-05-21 MED ORDER — ACETAMINOPHEN 325 MG PO TABS: 650.0000 mg | ORAL_TABLET | Freq: Four times a day (QID) | ORAL | Status: DC | PRN

## 2021-05-21 MED ORDER — OXYCODONE HCL 5 MG PO TABS
5.0000 mg | ORAL_TABLET | ORAL | Status: DC | PRN
  Administered 2021-05-21 – 2021-05-22 (×3): 5 mg via ORAL
  Filled 2021-05-21 (×3): qty 1

## 2021-05-21 MED ORDER — SODIUM CHLORIDE 0.9 % IV BOLUS
500.0000 mL | Freq: Once | INTRAVENOUS | Status: AC
  Administered 2021-05-21: 500 mL via INTRAVENOUS

## 2021-05-21 MED ORDER — TRAZODONE HCL 50 MG PO TABS
50.0000 mg | ORAL_TABLET | Freq: Every day | ORAL | Status: DC
  Administered 2021-05-21: 50 mg via ORAL
  Filled 2021-05-21 (×2): qty 1

## 2021-05-21 MED ORDER — NIRMATRELVIR/RITONAVIR (PAXLOVID)TABLET
3.0000 | ORAL_TABLET | Freq: Two times a day (BID) | ORAL | Status: DC
  Administered 2021-05-21 – 2021-05-22 (×2): 3 via ORAL
  Filled 2021-05-21: qty 30

## 2021-05-21 MED ORDER — POTASSIUM CHLORIDE CRYS ER 20 MEQ PO TBCR
40.0000 meq | EXTENDED_RELEASE_TABLET | Freq: Once | ORAL | Status: AC
  Administered 2021-05-21: 40 meq via ORAL
  Filled 2021-05-21: qty 2

## 2021-05-21 MED ORDER — ALBUTEROL SULFATE (2.5 MG/3ML) 0.083% IN NEBU: 2.5000 mg | INHALATION_SOLUTION | RESPIRATORY_TRACT | Status: DC | PRN

## 2021-05-21 MED ORDER — POLYETHYLENE GLYCOL 3350 17 G PO PACK: 17.0000 g | PACK | Freq: Every day | ORAL | Status: DC | PRN

## 2021-05-21 MED ORDER — BISACODYL 5 MG PO TBEC: 5.0000 mg | DELAYED_RELEASE_TABLET | Freq: Every day | ORAL | Status: DC | PRN

## 2021-05-21 MED ORDER — ENOXAPARIN SODIUM 60 MG/0.6ML IJ SOSY
0.5000 mg/kg | PREFILLED_SYRINGE | INTRAMUSCULAR | Status: DC
  Administered 2021-05-21: 55 mg via SUBCUTANEOUS
  Filled 2021-05-21: qty 0.6

## 2021-05-21 MED ORDER — SERTRALINE HCL 50 MG PO TABS
200.0000 mg | ORAL_TABLET | Freq: Every day | ORAL | Status: DC
  Administered 2021-05-21: 200 mg via ORAL
  Filled 2021-05-21: qty 4

## 2021-05-21 MED ORDER — ASPIRIN 81 MG PO CHEW
324.0000 mg | CHEWABLE_TABLET | Freq: Once | ORAL | Status: AC
  Administered 2021-05-21: 324 mg via ORAL
  Filled 2021-05-21: qty 4

## 2021-05-21 NOTE — ED Notes (Signed)
Pt had syncopal episode today. See triage note. Pt is alert and oriented at this time. Pt denies chest pain and SOB. ?

## 2021-05-21 NOTE — ED Notes (Signed)
Per EDP, draw second trop in 1 hour at 1830. ?

## 2021-05-21 NOTE — ED Provider Notes (Signed)
? ?[facility] ?Provider Note ? ? ? Event Date/Time  ? First MD Initiated Contact with Patient 05/21/21 1457   ?  (approximate) ? ? ?History  ? ?Loss of Consciousness ? ? ?HPI ? ?Hannah Maynard is a 56 y.o. female presents to the ER for evaluation of syncopal episode that occurred while at home today.  Has been having headache and symptoms that she feels are related to sinus infection for the past 3 to 4 days.  Has had nonproductive cough.  No measured fevers also having muscle cramps and achiness.  Was taking shower and had 2 witnessed fainting spell lasting about a minute.  Family member at bedside states that he saw this happen.  He went to get cold water and splashed on her face which woke her up.  There is no seizure-like activity no postictal period.  She does reportedly endorse decreased p.o. intake over the past few days that she has not been feeling well.  Has had 1 fainting episode previously when she was diagnosed with COVID 2 or 3 years ago. ?  ? ? ?Physical Exam  ? ?Triage Vital Signs: ?ED Triage Vitals  ?Enc Vitals Group  ?   BP 05/21/21 1324 123/69  ?   Pulse Rate 05/21/21 1324 70  ?   Resp 05/21/21 1324 17  ?   Temp 05/21/21 1324 97.8 ?F (36.6 ?C)  ?   Temp Source 05/21/21 1324 Oral  ?   SpO2 05/21/21 1324 94 %  ?   Weight --   ?   Height 05/21/21 1321 '5\' 5"'$  (1.651 m)  ?   Head Circumference --   ?   Peak Flow --   ?   Pain Score 05/21/21 1321 5  ?   Pain Loc --   ?   Pain Edu? --   ?   Excl. in [place]? --   ? ? ?Most recent vital signs: ?Vitals:  ? 05/21/21 1600 05/21/21 1630  ?BP: (!) 143/75 132/73  ?Pulse: 80 65  ?Resp: 13 15  ?Temp:    ?SpO2: 95% 93%  ? ? ? ?Constitutional: Alert  ?Eyes: Conjunctivae are normal.  ?Head: Atraumatic. ?Nose: No congestion/rhinnorhea. ?Mouth/Throat: Mucous membranes are moist.   ?Neck: Painless ROM.  ?Cardiovascular:   Good peripheral circulation. ?Respiratory: Normal respiratory effort.  No retractions.  ?Gastrointestinal: Soft and nontender.   ?Musculoskeletal:  no deformity ?Neurologic:  CN- intact.  No facial droop, Normal FNF.  Normal heel to shin.  Sensation intact bilaterally. Normal speech and language. No gross focal neurologic deficits are appreciated. No gait instability. ?Skin:  Skin is warm, dry and intact. No rash noted. ?Psychiatric: Mood and affect are normal. Speech and behavior are normal. ? ? ? ?ED Results / Procedures / Treatments  ? ?Labs ?(all labs ordered are listed, but only abnormal results are displayed) ?Labs Reviewed  ?RESP PANEL BY RT-PCR (FLU A&B, COVID) ARPGX2 - Abnormal; Notable for the following components:  ?    Result Value  ? SARS Coronavirus 2 by RT PCR POSITIVE (*)   ? All other components within normal limits  ?BASIC METABOLIC PANEL - Abnormal; Notable for the following components:  ? Potassium 2.9 (*)   ? Glucose, Bld 114 (*)   ? Creatinine, Ser 1.12 (*)   ? Calcium 8.7 (*)   ? GFR, Estimated 57 (*)   ? All other components within normal limits  ?CBC - Abnormal; Notable for the following components:  ? RBC  5.12 (*)   ? HCT 46.2 (*)   ? All other components within normal limits  ?URINALYSIS, COMPLETE (UACMP) WITH MICROSCOPIC - Abnormal; Notable for the following components:  ? Color, Urine AMBER (*)   ? APPearance CLOUDY (*)   ? Hgb urine dipstick MODERATE (*)   ? Ketones, ur 20 (*)   ? Protein, ur 100 (*)   ? Bacteria, UA FEW (*)   ? All other components within normal limits  ?TROPONIN I (HIGH SENSITIVITY) - Abnormal; Notable for the following components:  ? Troponin I (High Sensitivity) 22 (*)   ? All other components within normal limits  ?HEPATIC FUNCTION PANEL  ?LIPASE, BLOOD  ?URINALYSIS, ROUTINE W REFLEX MICROSCOPIC  ?TROPONIN I (HIGH SENSITIVITY)  ? ? ? ?EKG ? ?ED ECG REPORT ?I, Merlyn Lot, the attending physician, personally viewed and interpreted this ECG. ? ? Date: 05/21/2021 ? EKG Time: 13:25 ? Rate: 70 ? Rhythm: sinus ? Axis: normal ? Intervals:normal ? ST&T Change: no stemi, no  depressions ? ? ? ?RADIOLOGY ?Please see ED Course for my review and interpretation. ? ?I personally reviewed all radiographic images ordered to evaluate for the above acute complaints and reviewed radiology reports and findings.  These findings were personally discussed with the patient.  Please see medical record for radiology report. ? ? ? ?PROCEDURES: ? ?Critical Care performed: No ? ?Procedures ? ? ?MEDICATIONS ORDERED IN ED: ?Medications  ?magnesium sulfate IVPB 2 g 50 mL (2 g Intravenous New Bag/Given 05/21/21 1702)  ?potassium chloride SA (KLOR-CON M) CR tablet 40 mEq (40 mEq Oral Given 05/21/21 1702)  ?sodium chloride 0.9 % bolus 500 mL (500 mLs Intravenous New Bag/Given 05/21/21 1657)  ?aspirin chewable tablet 324 mg (324 mg Oral Given 05/21/21 1731)  ? ? ? ?IMPRESSION / MDM / ASSESSMENT AND PLAN / ED COURSE  ?I reviewed the triage vital signs and the nursing notes. ?             ?               ? ?Differential diagnosis includes, but is not limited to, hydration, orthostasis, electrolyte abnormality, anemia, dysrhythmia, ACS, CVA, SAH, viral illness, vasovagal ? ?Presenting with syncopal episode as described above.  Clinically well-appearing on arrival does not seem consistent with seizure per history.  CT head ordered for the above differential.  EKG was nonspecific changes.  She is currently denying any chest pain but was having some chest tightness earlier does have a smoking history.  No previous cardiac work-up but does have family history. ? ?Clinical Course as of 05/21/21 1800  ?Mon May 21, 2021  ?1630 Potassium borderline low.  Will give IV fluids as well as IV mag.  CT head my review interpretation does not show any evidence of bleed.  Per radiology report no acute intracranial abnormality. [PR]  ?1720 Troponin is mildly elevated.  No previous cardiac work-up therefore will consult with hospitalist for admission for observation due to concern for cardiac etiology of syncopal episode. [PR]  ?1759 Case  discussed in consultation with hospitalist who agrees to admit patient to their service. [PR]  ?  ?Clinical Course User Index ?[PR] Merlyn Lot, MD  ? ? ? ?FINAL CLINICAL IMPRESSION(S) / ED DIAGNOSES  ? ?Final diagnoses:  ?Syncope, unspecified syncope type  ?Orthostatic hypotension  ? ? ? ?Rx / DC Orders  ? ?ED Discharge Orders   ? ? None  ? ?  ? ? ? ?Note:  This document was  prepared using Systems analyst and may include unintentional dictation errors. ? ?  ?Merlyn Lot, MD ?05/21/21 1800 ? ?

## 2021-05-21 NOTE — ED Notes (Signed)
Pt denied dizziness with ambulation. Pt back in bed now, warm blanket provided. ?

## 2021-05-21 NOTE — ED Triage Notes (Signed)
Patient to ER via [facility] from home with c/o syncopal episode, confused on EMS arrival. Patient was in the shower, then collapsed in the bedroom.  ? ?EMS VS- ?045 systolic in stretcher. Unable to complete orhtostatic vital due to weakness. Standing BP (unable to complete BP) 95/57 ?HR 80, 98% on RA. 118 cbg. 98.2 temp ?12 lead EKG unremarkable.  ? ?Takes oxy/ baclofen/ lyrica. Has been unable to take for several days due to nausea and feeling badly. No appetite.  ? ?Sinus infection for the past few days. Has been taking sudafed as prescribed. No relief in symptoms.  ? ?No recent falls. ? ?500cc fluids on route. Alert and oriented after first 250cc.  ?

## 2021-05-21 NOTE — ED Notes (Signed)
Pt NAD in bed, a/ox4. Pt states she has been sick with n/v/not feeling well and today had a syncopal episode in her bedroom. Per EMS, was altered at that time with +orthostatic VS. Pt c/o body and leg pain, denies dizziness, visual changes, CP, SOB, ABD pain.  ?

## 2021-05-21 NOTE — Progress Notes (Signed)
PHARMACIST - PHYSICIAN COMMUNICATION ? ?CONCERNING:  Enoxaparin (Lovenox) for DVT Prophylaxis  ? ? ?RECOMMENDATION: ?Patient was prescribed enoxaparin '40mg'$  q24 hours for VTE prophylaxis.  ? ?Danley Danker Weights  ? 05/21/21 1930  ?Weight: 110.7 kg (244 lb 1.6 oz)  ? ? ?Body mass index is 40.62 kg/m?. ? ?Estimated Creatinine Clearance: 88.4 mL/min (by C-G formula based on SCr of 0.87 mg/dL). ? ? ?Based on [place] patient is candidate for enoxaparin 0.'5mg'$ /kg TBW SQ every 24 hours based on BMI being >30. ? ?DESCRIPTION: ?Pharmacy has adjusted enoxaparin dose per [facility] policy. ? ?Patient is now receiving enoxaparin 55 mg every 24 hours  ? ? ?Benita Gutter ?05/21/2021 ?7:50 PM  ?
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
## 2021-05-21 NOTE — ED Notes (Signed)
Helped pt walk to toilet for urine sample. ?

## 2021-05-22 DIAGNOSIS — R55 Syncope and collapse: Secondary | ICD-10-CM | POA: Diagnosis not present

## 2021-05-22 LAB — CBC
HCT: 43 % (ref 36.0–46.0)
Hemoglobin: 13.9 g/dL (ref 12.0–15.0)
MCH: 29.4 pg (ref 26.0–34.0)
MCHC: 32.3 g/dL (ref 30.0–36.0)
MCV: 91.1 fL (ref 80.0–100.0)
Platelets: 176 10*3/uL (ref 150–400)
RBC: 4.72 MIL/uL (ref 3.87–5.11)
RDW: 12.3 % (ref 11.5–15.5)
WBC: 5.3 10*3/uL (ref 4.0–10.5)
nRBC: 0 % (ref 0.0–0.2)

## 2021-05-22 LAB — BASIC METABOLIC PANEL
Anion gap: 7 (ref 5–15)
BUN: 16 mg/dL (ref 6–20)
CO2: 24 mmol/L (ref 22–32)
Calcium: 8.5 mg/dL — ABNORMAL LOW (ref 8.9–10.3)
Chloride: 109 mmol/L (ref 98–111)
Creatinine, Ser: 0.95 mg/dL (ref 0.44–1.00)
GFR, Estimated: >60 mL/min (ref >60)
Glucose, Bld: 132 mg/dL — ABNORMAL HIGH (ref 70–99)
Potassium: 4.1 mmol/L (ref 3.5–5.1)
Sodium: 140 mmol/L (ref 135–145)

## 2021-05-22 LAB — MAGNESIUM: Magnesium: 2.2 mg/dL (ref 1.7–2.4)

## 2021-05-22 MED ORDER — NIRMATRELVIR/RITONAVIR (PAXLOVID)TABLET: 3.0000 | ORAL_TABLET | Freq: Two times a day (BID) | ORAL | 0 refills | Status: AC

## 2021-05-22 MED ORDER — NIRMATRELVIR/RITONAVIR (PAXLOVID)TABLET: 3.0000 | ORAL_TABLET | Freq: Two times a day (BID) | ORAL | 0 refills | Status: DC

## 2021-05-22 NOTE — Plan of Care (Addendum)
Lyrica accidentally tossed in sharps box at [place] with Hep syringe for 32H.  So - wasted.  Barbaraann Rondo , pharm notified and will also assist in retrieving another dose for patient.  Charge also informed but states no way to retrieve. ?
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
## 2021-06-11 ENCOUNTER — Ambulatory Visit: Payer: Medicaid Other | Attending: Anesthesiology | Admitting: Anesthesiology

## 2021-06-11 ENCOUNTER — Encounter: Payer: Self-pay | Admitting: Anesthesiology

## 2021-06-11 DIAGNOSIS — M48062 Spinal stenosis, lumbar region with neurogenic claudication: Secondary | ICD-10-CM

## 2021-06-11 DIAGNOSIS — M5442 Lumbago with sciatica, left side: Secondary | ICD-10-CM

## 2021-06-11 DIAGNOSIS — M47816 Spondylosis without myelopathy or radiculopathy, lumbar region: Secondary | ICD-10-CM

## 2021-06-11 DIAGNOSIS — M5441 Lumbago with sciatica, right side: Secondary | ICD-10-CM | POA: Diagnosis not present

## 2021-06-11 DIAGNOSIS — M5030 Other cervical disc degeneration, unspecified cervical region: Secondary | ICD-10-CM

## 2021-06-11 DIAGNOSIS — G8929 Other chronic pain: Secondary | ICD-10-CM

## 2021-06-11 DIAGNOSIS — M5431 Sciatica, right side: Secondary | ICD-10-CM

## 2021-06-11 MED ORDER — PREGABALIN 75 MG PO CAPS: 150.0000 mg | ORAL_CAPSULE | Freq: Two times a day (BID) | ORAL | 0 refills | Status: DC

## 2021-06-11 MED ORDER — OXYCODONE HCL 5 MG PO TABS: 5.0000 mg | ORAL_TABLET | ORAL | 0 refills | Status: DC | PRN

## 2021-06-11 MED ORDER — OXYCODONE HCL 5 MG PO TABS
5.0000 mg | ORAL_TABLET | ORAL | 0 refills | Status: AC | PRN
Start: 2021-07-23 — End: 2021-08-22
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
## 2021-06-25 ENCOUNTER — Telehealth: Payer: Self-pay

## 2021-06-25 NOTE — Telephone Encounter (Signed)
PA for oxycodone sent via [facility] # [id number].  Patient notified. kt ?

## 2021-06-25 NOTE — Telephone Encounter (Signed)
She needs prior auth for her medicine. She is out today. I told her you probably are already working on it but I would send a note back letting you know she is out today.  ?

## 2021-07-03 ENCOUNTER — Encounter: Payer: Self-pay | Admitting: Anesthesiology

## 2021-07-03 ENCOUNTER — Other Ambulatory Visit: Payer: Self-pay

## 2021-07-03 ENCOUNTER — Ambulatory Visit (HOSPITAL_BASED_OUTPATIENT_CLINIC_OR_DEPARTMENT_OTHER): Payer: Medicaid Other | Admitting: Anesthesiology

## 2021-07-03 ENCOUNTER — Ambulatory Visit
Admission: RE | Admit: 2021-07-03 | Discharge: 2021-07-03 | Disposition: A | Discharge status: Home / Self Care | Payer: Medicaid Other | Source: Ambulatory Visit | Attending: Anesthesiology | Admitting: Anesthesiology

## 2021-07-03 ENCOUNTER — Other Ambulatory Visit: Payer: Self-pay | Admitting: Anesthesiology

## 2021-07-03 VITALS — BP 147/80 | HR 74 | Temp 97.8°F | Resp 16 | Ht 65.5 in | Wt 236.0 lb

## 2021-07-03 DIAGNOSIS — R52 Pain, unspecified: Secondary | ICD-10-CM | POA: Diagnosis present

## 2021-07-03 DIAGNOSIS — Z79891 Long term (current) use of opiate analgesic: Secondary | ICD-10-CM

## 2021-07-03 DIAGNOSIS — G8929 Other chronic pain: Secondary | ICD-10-CM

## 2021-07-03 DIAGNOSIS — M5431 Sciatica, right side: Secondary | ICD-10-CM

## 2021-07-03 DIAGNOSIS — M5442 Lumbago with sciatica, left side: Secondary | ICD-10-CM | POA: Insufficient documentation

## 2021-07-03 DIAGNOSIS — M5030 Other cervical disc degeneration, unspecified cervical region: Secondary | ICD-10-CM | POA: Insufficient documentation

## 2021-07-03 DIAGNOSIS — M48062 Spinal stenosis, lumbar region with neurogenic claudication: Secondary | ICD-10-CM | POA: Insufficient documentation

## 2021-07-03 DIAGNOSIS — M545 Low back pain, unspecified: Secondary | ICD-10-CM

## 2021-07-03 DIAGNOSIS — G894 Chronic pain syndrome: Secondary | ICD-10-CM

## 2021-07-03 DIAGNOSIS — M5441 Lumbago with sciatica, right side: Secondary | ICD-10-CM | POA: Diagnosis present

## 2021-07-03 DIAGNOSIS — M47816 Spondylosis without myelopathy or radiculopathy, lumbar region: Secondary | ICD-10-CM | POA: Diagnosis present

## 2021-07-03 MED ORDER — SODIUM CHLORIDE (PF) 0.9 % IJ SOLN
INTRAMUSCULAR | Status: AC
  Filled 2021-07-03: qty 10

## 2021-07-03 MED ORDER — TRIAMCINOLONE ACETONIDE 40 MG/ML IJ SUSP
INTRAMUSCULAR | Status: AC
  Filled 2021-07-03: qty 1

## 2021-07-03 MED ORDER — ROPIVACAINE HCL 2 MG/ML IJ SOLN
10.0000 mL | Freq: Once | INTRAMUSCULAR | Status: AC
  Administered 2021-07-03: 10 mL via EPIDURAL

## 2021-07-03 MED ORDER — ROPIVACAINE HCL 2 MG/ML IJ SOLN
INTRAMUSCULAR | Status: AC
  Filled 2021-07-03: qty 20

## 2021-07-03 MED ORDER — OXYCODONE HCL 5 MG PO TABS: 5.0000 mg | ORAL_TABLET | ORAL | 0 refills | Status: DC | PRN

## 2021-07-03 MED ORDER — IOPAMIDOL (ISOVUE-M 200) INJECTION 41%
20.0000 mL | Freq: Once | INTRAMUSCULAR | Status: DC | PRN
  Administered 2021-07-03: 20 mL

## 2021-07-03 MED ORDER — SODIUM CHLORIDE 0.9% FLUSH
10.0000 mL | Freq: Once | INTRAVENOUS | Status: AC
  Administered 2021-07-03: 10 mL

## 2021-07-03 MED ORDER — LIDOCAINE HCL (PF) 1 % IJ SOLN
5.0000 mL | Freq: Once | INTRAMUSCULAR | Status: AC
  Administered 2021-07-03: 5 mL via SUBCUTANEOUS

## 2021-07-03 MED ORDER — LIDOCAINE HCL (PF) 1 % IJ SOLN
INTRAMUSCULAR | Status: AC
Start: 2021-07-03 — End: ?
  Filled 2021-07-03: qty 10

## 2021-07-03 MED ORDER — TRIAMCINOLONE ACETONIDE 40 MG/ML IJ SUSP
40.0000 mg | Freq: Once | INTRAMUSCULAR | Status: AC
  Administered 2021-07-03: 40 mg

## 2021-07-03 NOTE — Patient Instructions (Signed)
Pain Management Discharge Instructions  General Discharge Instructions :  If you need to reach your doctor call: Monday-Friday 8:00 am - 4:00 pm at [phone] or toll free [phone].  After clinic hours [phone] to have operator reach doctor.  Bring all of your medication bottles to all your appointments in the pain clinic.  To cancel or reschedule your appointment with Pain Management please remember to call 24 hours in advance to avoid a fee.  Refer to the educational materials which you have been given on: General Risks, I had my Procedure. Discharge Instructions, Post Sedation.  Post Procedure Instructions:  The drugs you were given will stay in your system until tomorrow, so for the next 24 hours you should not drive, make any legal decisions or drink any alcoholic beverages.  You may eat anything you prefer, but it is better to start with liquids then soups and crackers, and gradually work up to solid foods.  Please notify your doctor immediately if you have any unusual bleeding, trouble breathing or pain that is not related to your normal pain.  Depending on the type of procedure that was done, some parts of your body may feel week and/or numb.  This usually clears up by tonight or the next day.  Walk with the use of an assistive device or accompanied by an adult for the 24 hours.  You may use ice on the affected area for the first 24 hours.  Put ice in a Ziploc bag and cover with a towel and place against area 15 minutes on 15 minutes off.  You may switch to heat after 24 hours.Epidural Steroid Injection Patient Information  Description: The epidural space surrounds the nerves as they exit the spinal cord.  In some patients, the nerves can be compressed and inflamed by a bulging disc or a tight spinal canal (spinal stenosis).  By injecting steroids into the epidural space, we can bring irritated nerves into direct contact with a potentially helpful medication.  These  steroids act directly on the irritated nerves and can reduce swelling and inflammation which often leads to decreased pain.  Epidural steroids may be injected anywhere along the spine and from the neck to the low back depending upon the location of your pain.   After numbing the skin with local anesthetic (like Novocaine), a small needle is passed into the epidural space slowly.  You may experience a sensation of pressure while this is being done.  The entire block usually last less than 10 minutes.  Conditions which may be treated by epidural steroids:  Low back and leg pain Neck and arm pain Spinal stenosis Post-laminectomy syndrome Herpes zoster (shingles) pain Pain from compression fractures  Preparation for the injection:  Do not eat any solid food or dairy products within 8 hours of your appointment.  You may drink clear liquids up to 3 hours before appointment.  Clear liquids include water, black coffee, juice or soda.  No milk or cream please. You may take your regular medication, including pain medications, with a sip of water before your appointment  Diabetics should hold regular insulin (if taken separately) and take 1/2 normal NPH dos the morning of the procedure.  Carry some sugar containing items with you to your appointment. A driver must accompany you and be prepared to drive you home after your procedure.  Bring all your current medications with your. An IV may be inserted and sedation may be given at the discretion of the physician.     A blood pressure cuff, EKG and other monitors will often be applied during the procedure.  Some patients may need to have extra oxygen administered for a short period. You will be asked to provide medical information, including your allergies, prior to the procedure.  We must know immediately if you are taking blood thinners (like Coumadin/Warfarin)  Or if you are allergic to IV iodine contrast (dye). We must know if you could possible be  pregnant.  Possible side-effects: Bleeding from needle site Infection (rare, may require surgery) Nerve injury (rare) Numbness & tingling (temporary) Difficulty urinating (rare, temporary) Spinal headache ( a headache worse with upright posture) Light -headedness (temporary) Pain at injection site (several days) Decreased blood pressure (temporary) Weakness in arm/leg (temporary) Pressure sensation in back/neck (temporary)  Call if you experience: Fever/chills associated with headache or increased back/neck pain. Headache worsened by an upright position. New onset weakness or numbness of an extremity below the injection site Hives or difficulty breathing (go to the emergency room) Inflammation or drainage at the infection site Severe back/neck pain Any new symptoms which are concerning to you  Please note:  Although the local anesthetic injected can often make your back or neck feel good for several hours after the injection, the pain will likely return.  It takes 3-7 days for steroids to work in the epidural space.  You may not notice any pain relief for at least that one week.  If effective, we will often do a series of three injections spaced 3-6 weeks apart to maximally decrease your pain.  After the initial series, we generally will wait several months before considering a repeat injection of the same type.  If you have any questions, please call [phone] [facility] 

## 2021-07-03 NOTE — Progress Notes (Signed)
Safety precautions to be maintained throughout the outpatient stay will include: orient to surroundings, keep bed in low position, maintain call bell within reach at all times, provide assistance with transfer out of bed and ambulation.  

## 2021-07-04 ENCOUNTER — Other Ambulatory Visit: Payer: Self-pay | Admitting: Gastroenterology

## 2021-07-04 ENCOUNTER — Telehealth: Payer: Self-pay | Admitting: *Deleted

## 2021-07-04 DIAGNOSIS — K838 Other specified diseases of biliary tract: Secondary | ICD-10-CM

## 2021-07-04 NOTE — Progress Notes (Signed)
? ?Subjective:  ?Patient ID: Hannah Maynard, female    DOB: 07/29/1965  Age: 56 y.o. MRN: [id number] ? ?CC: Back Pain (low) ? ? ?Procedure: L5-S1 epidural steroid under fluoroscopic guidance with no sedation ? ?HPI ?Hannah Maynard presents for reevaluation.  Hannah Maynard has been doing well following Hannah Maynard most recent epidural back in January.  Hannah Maynard has had recurrence of Hannah Maynard lower extremity right leg sciatica comparable to what Hannah Maynard has had in the past.  Hannah Maynard reports that Hannah Maynard last epidural was able to eliminate the majority of Hannah Maynard low back pain and right leg pain for about 6 to 8 weeks before Hannah Maynard had recurrence of this sciatica symptoms.  Hannah Maynard does still have chronic low back pain despite the epidurals but this is manageable with Hannah Maynard current opioid regimen.  The combination of epidural injections approximately every 3 months with daily baclofen and Roxicodone is keeping Hannah Maynard chronic low back and leg pain under reasonable control.  Hannah Maynard denies any diverting or or illicit use with the medication and Hannah Maynard is without side effect.  The quality characteristic and distribution of Hannah Maynard low back pain has been stable in nature.  Otherwise Hannah Maynard is in Hannah Maynard usual state of health at this time.  No change in lower extremity strength function or bowel or bladder function is noted. ? ?Outpatient Medications Prior to Visit  ?Medication Sig Dispense Refill  ? atorvastatin (LIPITOR) 10 MG tablet Take 10 mg by mouth daily.    ? baclofen (LIORESAL) 10 MG tablet Take 10 mg by mouth 2 (two) times daily.    ? fluticasone (FLONASE) 50 MCG/ACT nasal spray Place 1 spray into both nostrils daily.    ? ibuprofen (ADVIL,MOTRIN) 200 MG tablet Take 200 mg by mouth every 6 (six) hours as needed.    ? montelukast (SINGULAIR) 10 MG tablet Take 10 mg by mouth at bedtime.    ? Nerve Stimulator (ZEWA TENS/EMS COMBO UNIT) DEVI by Does not apply route.    ? [START ON 07/23/2021] oxyCODONE (ROXICODONE) 5 MG immediate release tablet Take 1 tablet (5 mg total) by mouth every 4  (four) hours as needed for severe pain. 150 tablet 0  ? pregabalin (LYRICA) 75 MG capsule Take 2 capsules (150 mg total) by mouth 2 (two) times daily. 120 capsule 0  ? PROAIR HFA 108 (90 Base) MCG/ACT inhaler Inhale 2 puffs into the lungs as needed.    ? sertraline (ZOLOFT) 100 MG tablet Take 200 mg by mouth at bedtime.     ? SYMBICORT 80-4.5 MCG/ACT inhaler Inhale 2 puffs into the lungs 2 (two) times daily.    ? traZODone (DESYREL) 50 MG tablet Take 50 mg by mouth at bedtime.    ? triamcinolone cream (KENALOG) 0.1 % Apply 1 application topically 3 (three) times daily as needed.    ? oxyCODONE (ROXICODONE) 5 MG immediate release tablet Take 1 tablet (5 mg total) by mouth every 4 (four) hours as needed for severe pain. 150 tablet 0  ? ?Facility-Administered Medications Prior to Visit  ?Medication Dose Route Frequency Provider Last Rate Last Admin  ? lidocaine (PF) (XYLOCAINE) 1 % injection 5 mL  5 mL Subcutaneous Once Molli Barrows, MD      ? ropivacaine (PF) 2 mg/mL (0.2%) (NAROPIN) injection 10 mL  10 mL Epidural Once Molli Barrows, MD      ? ? ?Review of Systems ?CNS: No confusion or sedation ?Cardiac: No angina or palpitations ?GI: No abdominal pain or constipation ?Constitutional: No  nausea vomiting fevers or chills ? ?Objective:  ?BP (!) 147/80   Pulse 74   Temp 97.8 ?F (36.6 ?C) (Temporal)   Resp 16   Ht 5' 5.5" (1.664 m)   Wt 236 lb (107 kg)   LMP  (LMP Unknown)   SpO2 98%   BMI 38.68 kg/m?  ? ? ?BP Readings from Last 3 Encounters:  ?07/03/21 (!) 147/80  ?05/22/21 122/74  ?03/26/21 (!) 164/100  ? ? ? ?Wt Readings from Last 3 Encounters:  ?07/03/21 236 lb (107 kg)  ?05/21/21 244 lb 1.6 oz (110.7 kg)  ?03/26/21 237 lb (107.5 kg)  ? ? ? ?Physical Exam ?Pt is alert and oriented ?PERRL EOMI ?HEART IS RRR no murmur or rub ?LCTA no wheezing or rales ?MUSCULOSKELETAL reveals some paraspinous muscle tenderness in the low back.  Hannah Maynard is walking with a cane.  Hannah Maynard does have a antalgic gait with a positive  straight leg raise on the right side negative on the left.  Muscle tone and bulk is at baseline. ? ?Labs ? ?No results found for: HGBA1C ?Lab Results  ?Component Value Date  ? CREATININE 0.95 05/22/2021  ? ? ?-------------------------------------------------------------------------------------------------------------------- ?Lab Results  ?Component Value Date  ? WBC 5.3 05/22/2021  ? HGB 13.9 05/22/2021  ? HCT 43.0 05/22/2021  ? PLT 176 05/22/2021  ? GLUCOSE 132 (H) 05/22/2021  ? ALT 21 05/21/2021  ? AST 27 05/21/2021  ? NA 140 05/22/2021  ? K 4.1 05/22/2021  ? CL 109 05/22/2021  ? CREATININE 0.95 05/22/2021  ? BUN 16 05/22/2021  ? CO2 24 05/22/2021  ? ? ?--------------------------------------------------------------------------------------------------------------------- ?DG PAIN CLINIC C-ARM 1-60 MIN NO REPORT ? ?Result Date: 07/03/2021 ?Fluoro was used, but no Radiologist interpretation will be provided. Please refer to "NOTES" tab for provider progress note. ? ? ? ?Assessment & Plan:  ? ?Hannah Maynard was seen today for back pain. ? ?Diagnoses and all orders for this visit: ? ?Chronic bilateral low back pain with bilateral sciatica ?-     triamcinolone acetonide (KENALOG-40) injection 40 mg ?-     sodium chloride flush (NS) 0.9 % injection 10 mL ?-     ropivacaine (PF) 2 mg/mL (0.2%) (NAROPIN) injection 10 mL ?-     lidocaine (PF) (XYLOCAINE) 1 % injection 5 mL ?-     iopamidol (ISOVUE-M) 41 % intrathecal injection 20 mL ? ?Spinal stenosis of lumbar region with neurogenic claudication ?-     triamcinolone acetonide (KENALOG-40) injection 40 mg ?-     sodium chloride flush (NS) 0.9 % injection 10 mL ?-     ropivacaine (PF) 2 mg/mL (0.2%) (NAROPIN) injection 10 mL ?-     lidocaine (PF) (XYLOCAINE) 1 % injection 5 mL ?-     iopamidol (ISOVUE-M) 41 % intrathecal injection 20 mL ? ?Right sided sciatica ?-     triamcinolone acetonide (KENALOG-40) injection 40 mg ?-     sodium chloride flush (NS) 0.9 % injection 10 mL ?-      ropivacaine (PF) 2 mg/mL (0.2%) (NAROPIN) injection 10 mL ?-     lidocaine (PF) (XYLOCAINE) 1 % injection 5 mL ?-     iopamidol (ISOVUE-M) 41 % intrathecal injection 20 mL ? ?Lumbar spondylosis ? ?DDD (degenerative disc disease), cervical ? ?Facet arthritis of lumbar region ? ?Chronic pain syndrome ?-     ToxASSURE Select 13 (MW), Urine ?-     ToxASSURE Select 13 (MW), Urine ? ?Chronic bilateral low back pain without sciatica ? ?Long term current  use of opiate analgesic ?-     ToxASSURE Select 13 (MW), Urine ? ?Other orders ?-     oxyCODONE (ROXICODONE) 5 MG immediate release tablet; Take 1 tablet (5 mg total) by mouth every 4 (four) hours as needed for severe pain. ? ? ?   ? ? ?---------------------------------------------------------------------------------------------------------------------- ? ?Problem List Items Addressed This Visit   ? ?  ? Unprioritized  ? Chronic low back pain - Primary (Chronic)  ? Relevant Medications  ? oxyCODONE (ROXICODONE) 5 MG immediate release tablet (Start on 08/21/2021)  ? iopamidol (ISOVUE-M) 41 % intrathecal injection 20 mL  ? Chronic pain syndrome  ? Relevant Medications  ? oxyCODONE (ROXICODONE) 5 MG immediate release tablet (Start on 08/21/2021)  ? Other Relevant Orders  ? ToxASSURE Select 13 (MW), Urine  ? ToxASSURE Select 13 (MW), Urine  ? DDD (degenerative disc disease), cervical  ? Relevant Medications  ? oxyCODONE (ROXICODONE) 5 MG immediate release tablet (Start on 08/21/2021)  ? Long term current use of opiate analgesic  ? Relevant Orders  ? ToxASSURE Select 13 (MW), Urine  ? Lumbar spondylosis  ? Relevant Medications  ? oxyCODONE (ROXICODONE) 5 MG immediate release tablet (Start on 08/21/2021)  ? ?Other Visit Diagnoses   ? ? Spinal stenosis of lumbar region with neurogenic claudication      ? Relevant Medications  ? oxyCODONE (ROXICODONE) 5 MG immediate release tablet (Start on 08/21/2021)  ? triamcinolone acetonide (KENALOG-40) injection 40 mg (Completed)  ? sodium  chloride flush (NS) 0.9 % injection 10 mL (Completed)  ? ropivacaine (PF) 2 mg/mL (0.2%) (NAROPIN) injection 10 mL (Completed)  ? lidocaine (PF) (XYLOCAINE) 1 % injection 5 mL (Completed)  ? iopamidol (ISOVUE-M) 41 % intrat

## 2021-07-04 NOTE — Telephone Encounter (Signed)
Post procedure call;  patient reports that she is doing well.  

## 2021-07-09 LAB — TOXASSURE SELECT 13 (MW), URINE

## 2021-09-06 ENCOUNTER — Encounter: Payer: Self-pay | Admitting: Anesthesiology

## 2021-09-06 MED ORDER — PREGABALIN 75 MG PO CAPS: 150.0000 mg | ORAL_CAPSULE | Freq: Two times a day (BID) | ORAL | 3 refills | Status: AC

## 2021-09-06 MED ORDER — OXYCODONE HCL 5 MG PO TABS: 5.0000 mg | ORAL_TABLET | ORAL | 0 refills | Status: AC | PRN

## 2021-09-06 MED ORDER — BACLOFEN 10 MG PO TABS: 10.0000 mg | ORAL_TABLET | Freq: Two times a day (BID) | ORAL | 2 refills | Status: AC
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
## 2021-09-07 ENCOUNTER — Ambulatory Visit: Payer: Medicaid Other | Attending: Anesthesiology | Admitting: Anesthesiology

## 2021-09-07 DIAGNOSIS — G894 Chronic pain syndrome: Secondary | ICD-10-CM

## 2021-09-07 DIAGNOSIS — M5431 Sciatica, right side: Secondary | ICD-10-CM

## 2021-09-07 DIAGNOSIS — M5441 Lumbago with sciatica, right side: Secondary | ICD-10-CM

## 2021-09-07 DIAGNOSIS — M5430 Sciatica, unspecified side: Secondary | ICD-10-CM | POA: Diagnosis not present

## 2021-09-07 DIAGNOSIS — M47816 Spondylosis without myelopathy or radiculopathy, lumbar region: Secondary | ICD-10-CM

## 2021-09-07 DIAGNOSIS — M545 Low back pain, unspecified: Secondary | ICD-10-CM

## 2021-09-07 DIAGNOSIS — G8929 Other chronic pain: Secondary | ICD-10-CM

## 2021-09-07 DIAGNOSIS — M4696 Unspecified inflammatory spondylopathy, lumbar region: Secondary | ICD-10-CM

## 2021-09-07 DIAGNOSIS — M5442 Lumbago with sciatica, left side: Secondary | ICD-10-CM

## 2021-09-07 DIAGNOSIS — M48062 Spinal stenosis, lumbar region with neurogenic claudication: Secondary | ICD-10-CM

## 2021-09-07 DIAGNOSIS — M51369 Other intervertebral disc degeneration, lumbar region without mention of lumbar back pain or lower extremity pain: Secondary | ICD-10-CM

## 2021-09-07 DIAGNOSIS — M5030 Other cervical disc degeneration, unspecified cervical region: Secondary | ICD-10-CM | POA: Diagnosis not present

## 2021-09-07 DIAGNOSIS — M542 Cervicalgia: Secondary | ICD-10-CM

## 2021-09-07 DIAGNOSIS — R29898 Other symptoms and signs involving the musculoskeletal system: Secondary | ICD-10-CM

## 2021-09-07 DIAGNOSIS — Z79891 Long term (current) use of opiate analgesic: Secondary | ICD-10-CM

## 2021-09-07 DIAGNOSIS — M5136 Other intervertebral disc degeneration, lumbar region: Secondary | ICD-10-CM

## 2021-09-19 ENCOUNTER — Other Ambulatory Visit: Payer: Self-pay | Admitting: Anesthesiology

## 2021-09-19 ENCOUNTER — Telehealth: Payer: Self-pay | Admitting: Anesthesiology

## 2021-09-19 NOTE — Telephone Encounter (Signed)
Pt states that when she call to refilled her meds.Pharmacy stated that no refills had been called in. Pt last appt was [facility] appt on 09-07-21. Please give patient a call. Thanks

## 2021-09-19 NOTE — Telephone Encounter (Signed)
Called patient to confirm that she was requesting oxy.  Prescription is due to be filled tomorrow. Patient with understanding.

## 2021-10-08 ENCOUNTER — Encounter: Payer: Self-pay | Admitting: *Deleted

## 2021-10-09 ENCOUNTER — Encounter: Payer: Self-pay | Admitting: *Deleted

## 2021-10-09 ENCOUNTER — Other Ambulatory Visit: Payer: Self-pay

## 2021-10-09 ENCOUNTER — Ambulatory Visit: Payer: Medicaid Other | Admitting: Certified Registered Nurse Anesthetist

## 2021-10-09 ENCOUNTER — Encounter
Admission: RE | Disposition: A | Discharge status: Home / Self Care | Payer: Self-pay | Source: Ambulatory Visit | Attending: Gastroenterology

## 2021-10-09 ENCOUNTER — Ambulatory Visit
Admission: RE | Admit: 2021-10-09 | Discharge: 2021-10-09 | Disposition: A | Discharge status: Home / Self Care | Payer: Medicaid Other | Source: Ambulatory Visit | Attending: Gastroenterology | Admitting: Gastroenterology

## 2021-10-09 DIAGNOSIS — F17200 Nicotine dependence, unspecified, uncomplicated: Secondary | ICD-10-CM | POA: Diagnosis not present

## 2021-10-09 DIAGNOSIS — K640 First degree hemorrhoids: Secondary | ICD-10-CM | POA: Insufficient documentation

## 2021-10-09 DIAGNOSIS — D120 Benign neoplasm of cecum: Secondary | ICD-10-CM | POA: Insufficient documentation

## 2021-10-09 DIAGNOSIS — F419 Anxiety disorder, unspecified: Secondary | ICD-10-CM | POA: Diagnosis not present

## 2021-10-09 DIAGNOSIS — F32A Depression, unspecified: Secondary | ICD-10-CM | POA: Diagnosis not present

## 2021-10-09 DIAGNOSIS — Z79891 Long term (current) use of opiate analgesic: Secondary | ICD-10-CM | POA: Diagnosis not present

## 2021-10-09 DIAGNOSIS — G8929 Other chronic pain: Secondary | ICD-10-CM | POA: Insufficient documentation

## 2021-10-09 DIAGNOSIS — Z1211 Encounter for screening for malignant neoplasm of colon: Secondary | ICD-10-CM | POA: Diagnosis present

## 2021-10-09 DIAGNOSIS — K5730 Diverticulosis of large intestine without perforation or abscess without bleeding: Secondary | ICD-10-CM | POA: Diagnosis not present

## 2021-10-09 DIAGNOSIS — M549 Dorsalgia, unspecified: Secondary | ICD-10-CM | POA: Insufficient documentation

## 2021-10-09 DIAGNOSIS — Z800 Family history of malignant neoplasm of digestive organs: Secondary | ICD-10-CM | POA: Diagnosis not present

## 2021-10-09 HISTORY — PX: COLONOSCOPY: SHX5424

## 2021-10-09 SURGERY — COLONOSCOPY
Anesthesia: General

## 2021-10-09 MED ORDER — PROPOFOL 10 MG/ML IV BOLUS
INTRAVENOUS | Status: DC | PRN
  Administered 2021-10-09: 70 mg via INTRAVENOUS

## 2021-10-09 MED ORDER — LIDOCAINE HCL (CARDIAC) PF 100 MG/5ML IV SOSY
PREFILLED_SYRINGE | INTRAVENOUS | Status: DC | PRN
  Administered 2021-10-09: 50 mg via INTRAVENOUS

## 2021-10-09 MED ORDER — PROPOFOL 500 MG/50ML IV EMUL
INTRAVENOUS | Status: DC | PRN
  Administered 2021-10-09: 150 ug/kg/min via INTRAVENOUS

## 2021-10-09 MED ORDER — SODIUM CHLORIDE 0.9 % IV SOLN: INTRAVENOUS | Status: DC

## 2021-10-09 NOTE — Anesthesia Postprocedure Evaluation (Signed)
Anesthesia Post Note  Patient: Hannah Maynard  Procedure(s) Performed: COLONOSCOPY  Patient location during evaluation: Endoscopy Anesthesia Type: General Level of consciousness: awake and alert Pain management: pain level controlled Vital Signs Assessment: post-procedure vital signs reviewed and stable Respiratory status: spontaneous breathing, nonlabored ventilation, respiratory function stable and patient connected to nasal cannula oxygen Cardiovascular status: blood pressure returned to baseline and stable Postop Assessment: no apparent nausea or vomiting Anesthetic complications: no   No notable events documented.   Last Vitals:  Vitals:   10/09/21 0806 10/09/21 0905  BP: (!) 140/88 123/69  Pulse: 70 75  Resp: 18 13  Temp: (!) 36.1 C (!) 36.4 C  SpO2: 98% 97%    Last Pain:  Vitals:   10/09/21 0905  TempSrc: Temporal  PainSc: [place]

## 2021-10-09 NOTE — Interval H&P Note (Signed)
History and Physical Interval Note:  10/09/2021 8:31 AM  Hannah Maynard  has presented today for surgery, with the diagnosis of FAMILY HISTORY OF CCA.  The various methods of treatment have been discussed with the patient and family. After consideration of risks, benefits and other options for treatment, the patient has consented to  Procedure(s): COLONOSCOPY (N/A) as a surgical intervention.  The patient's history has been reviewed, patient examined, no change in status, stable for surgery.  I have reviewed the patient's chart and labs.  Questions were answered to the patient's satisfaction.     Lesly Rubenstein  Ok to proceed with colonoscopy

## 2021-10-09 NOTE — Anesthesia Procedure Notes (Signed)
Procedure Name: MAC Date/Time: 10/09/2021 8:31 AM  Performed by: Tollie Eth, CRNAPre-anesthesia Checklist: Patient identified, Emergency Drugs available, Suction available and Patient being monitored Patient Re-evaluated:Patient Re-evaluated prior to induction Oxygen Delivery Method: Nasal cannula Induction Type: IV induction Placement Confirmation: positive ETCO2
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
## 2021-10-09 NOTE — Transfer of Care (Signed)
Immediate Anesthesia Transfer of Care Note  Patient: Hannah Maynard  Procedure(s) Performed: COLONOSCOPY  Patient Location: Endoscopy Unit  Anesthesia Type:General  Level of Consciousness: drowsy  Airway & Oxygen Therapy: Patient Spontanous Breathing  Post-op Assessment: Report given to RN and Post -op Vital signs reviewed and stable  Post vital signs: Reviewed and stable  Last Vitals:  Vitals Value Taken Time  BP 123/69 10/09/21 0905  Temp    Pulse 75 10/09/21 0905  Resp 13 10/09/21 0905  SpO2 97 % 10/09/21 0905  Vitals shown include unvalidated device data.  Last Pain:  Vitals:   10/09/21 0806  TempSrc: Temporal  PainSc: 6          Complications: No notable events documented.
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
## 2021-10-09 NOTE — Op Note (Signed)
[facility] Patient Name: Hannah Maynard Procedure Date: 10/09/2021 8:34 AM MRN: [id number] Account #: [id number] Date of Birth: 1966-01-09 Admit Type: Outpatient Age: 56 Room: [facility] ENDO ROOM 1 Gender: Female Note Status: Finalized Instrument Name: Park Meo [id number] Procedure:             Colonoscopy Indications:           Screening patient at increased risk: Family history of                         1st-degree relative with colorectal cancer at age 52                         years (or older) Providers:             Andrey Farmer MD, MD Referring MD:          Elza Rafter (Referring MD) Medicines:             Monitored Anesthesia Care Complications:         No immediate complications. Estimated blood loss:                         Minimal. Procedure:             Pre-Anesthesia Assessment:                        - Prior to the procedure, a History and Physical was                         performed, and patient medications and allergies were                         reviewed. The patient is competent. The risks and                         benefits of the procedure and the sedation options and                         risks were discussed with the patient. All questions                         were answered and informed consent was obtained.                         Patient identification and proposed procedure were                         verified by the physician, the nurse, the                         anesthesiologist, the anesthetist and the technician                         in the endoscopy suite. Mental Status Examination:                         alert and oriented. Airway Examination: normal  oropharyngeal airway and neck mobility. Respiratory                         Examination: clear to auscultation. CV Examination:                         normal. Prophylactic Antibiotics: The patient does not                          require prophylactic antibiotics. Prior                         Anticoagulants: The patient has taken no previous                         anticoagulant or antiplatelet agents. ASA Grade                         Assessment: II - A patient with mild systemic disease.                         After reviewing the risks and benefits, the patient                         was deemed in satisfactory condition to undergo the                         procedure. The anesthesia plan was to use monitored                         anesthesia care (MAC). Immediately prior to                         administration of medications, the patient was                         re-assessed for adequacy to receive sedatives. The                         heart rate, respiratory rate, oxygen saturations,                         blood pressure, adequacy of pulmonary ventilation, and                         response to care were monitored throughout the                         procedure. The physical status of the patient was                         re-assessed after the procedure.                        After obtaining informed consent, the colonoscope was                         passed under direct vision. Throughout the procedure,  the patient's blood pressure, pulse, and oxygen                         saturations were monitored continuously. The                         Colonoscope was introduced through the anus and                         advanced to the the cecum, identified by appendiceal                         orifice and ileocecal valve. The colonoscopy was                         performed without difficulty. The patient tolerated                         the procedure well. The quality of the bowel                         preparation was good. Findings:      The perianal and digital rectal examinations were normal.      A 3 mm polyp was found in the ileocecal valve. The polyp was  sessile.       The polyp was removed with a cold snare. Resection and retrieval were       complete. Estimated blood loss was minimal.      A single small-mouthed diverticulum was found in the sigmoid colon.      Internal hemorrhoids were found during retroflexion. The hemorrhoids       were Grade I (internal hemorrhoids that do not prolapse).      The exam was otherwise without abnormality on direct and retroflexion       views. Impression:            - One 3 mm polyp at the ileocecal valve, removed with                         a cold snare. Resected and retrieved.                        - Diverticulosis in the sigmoid colon.                        - Internal hemorrhoids.                        - The examination was otherwise normal on direct and                         retroflexion views. Recommendation:        - Discharge patient to home.                        - Resume previous diet.                        - Continue present medications.                        -  Await pathology results.                        - Repeat colonoscopy for surveillance based on                         pathology results.                        - Return to referring physician as previously                         scheduled. Procedure Code(s):     --- Professional ---                        [phone], Colonoscopy, flexible; with removal of                         tumor(s), polyp(s), or other lesion(s) by snare                         technique Diagnosis Code(s):     --- Professional ---                        Z80.0, Family history of malignant neoplasm of                         digestive organs                        K63.5, Polyp of colon                        K64.0, First degree hemorrhoids                        K57.30, Diverticulosis of large intestine without                         perforation or abscess without bleeding CPT copyright 2019 [employer]. All rights reserved. The codes  documented in this report are preliminary and upon coder review may  be revised to meet current compliance requirements. Andrey Farmer MD, MD 10/09/2021 9:03:34 AM Number of Addenda: 0 Note Initiated On: 10/09/2021 8:34 AM Scope Withdrawal Time: 0 hours 11 minutes 35 seconds  Total Procedure Duration: 0 hours 18 minutes 34 seconds  Estimated Blood Loss:  Estimated blood loss was minimal.      [facility]

## 2021-10-10 ENCOUNTER — Encounter: Payer: Self-pay | Admitting: Gastroenterology

## 2021-10-10 LAB — SURGICAL PATHOLOGY

## 2021-11-06 ENCOUNTER — Telehealth: Payer: Self-pay

## 2021-11-06 NOTE — Telephone Encounter (Signed)
I have sent an appeal on the denied LESI.

## 2021-11-06 NOTE — Telephone Encounter (Addendum)
Patients pain score is a 8 out of 10. She wants to have the procedure done as soon as possible because they help her with the pain flare ups.

## 2021-11-26 ENCOUNTER — Encounter: Payer: Self-pay | Admitting: Anesthesiology

## 2021-11-26 ENCOUNTER — Other Ambulatory Visit: Payer: Self-pay | Admitting: Family Medicine

## 2021-11-26 ENCOUNTER — Ambulatory Visit (HOSPITAL_BASED_OUTPATIENT_CLINIC_OR_DEPARTMENT_OTHER): Payer: Medicaid Other | Admitting: Anesthesiology

## 2021-11-26 ENCOUNTER — Ambulatory Visit
Admission: RE | Admit: 2021-11-26 | Discharge: 2021-11-26 | Disposition: A | Discharge status: Home / Self Care | Payer: Medicaid Other | Source: Ambulatory Visit | Attending: Anesthesiology | Admitting: Anesthesiology

## 2021-11-26 ENCOUNTER — Other Ambulatory Visit: Payer: Self-pay | Admitting: Anesthesiology

## 2021-11-26 VITALS — BP 151/100 | HR 98 | Temp 96.9°F | Resp 16 | Ht 66.0 in | Wt 224.0 lb

## 2021-11-26 DIAGNOSIS — M47816 Spondylosis without myelopathy or radiculopathy, lumbar region: Secondary | ICD-10-CM | POA: Diagnosis not present

## 2021-11-26 DIAGNOSIS — G894 Chronic pain syndrome: Secondary | ICD-10-CM

## 2021-11-26 DIAGNOSIS — M5442 Lumbago with sciatica, left side: Secondary | ICD-10-CM | POA: Insufficient documentation

## 2021-11-26 DIAGNOSIS — G8929 Other chronic pain: Secondary | ICD-10-CM | POA: Diagnosis present

## 2021-11-26 DIAGNOSIS — M5030 Other cervical disc degeneration, unspecified cervical region: Secondary | ICD-10-CM

## 2021-11-26 DIAGNOSIS — M5431 Sciatica, right side: Secondary | ICD-10-CM | POA: Insufficient documentation

## 2021-11-26 DIAGNOSIS — Z79891 Long term (current) use of opiate analgesic: Secondary | ICD-10-CM

## 2021-11-26 DIAGNOSIS — R52 Pain, unspecified: Secondary | ICD-10-CM | POA: Diagnosis present

## 2021-11-26 DIAGNOSIS — M5136 Other intervertebral disc degeneration, lumbar region: Secondary | ICD-10-CM | POA: Diagnosis not present

## 2021-11-26 DIAGNOSIS — M51369 Other intervertebral disc degeneration, lumbar region without mention of lumbar back pain or lower extremity pain: Secondary | ICD-10-CM

## 2021-11-26 DIAGNOSIS — M542 Cervicalgia: Secondary | ICD-10-CM

## 2021-11-26 DIAGNOSIS — M5441 Lumbago with sciatica, right side: Secondary | ICD-10-CM | POA: Insufficient documentation

## 2021-11-26 DIAGNOSIS — M48062 Spinal stenosis, lumbar region with neurogenic claudication: Secondary | ICD-10-CM

## 2021-11-26 DIAGNOSIS — Z1231 Encounter for screening mammogram for malignant neoplasm of breast: Secondary | ICD-10-CM

## 2021-11-26 MED ORDER — LIDOCAINE HCL (PF) 1 % IJ SOLN
5.0000 mL | Freq: Once | INTRAMUSCULAR | Status: AC
  Administered 2021-11-26: 5 mL via SUBCUTANEOUS

## 2021-11-26 MED ORDER — TRIAMCINOLONE ACETONIDE 40 MG/ML IJ SUSP
40.0000 mg | Freq: Once | INTRAMUSCULAR | Status: AC
  Administered 2021-11-26: 40 mg

## 2021-11-26 MED ORDER — SODIUM CHLORIDE (PF) 0.9 % IJ SOLN
INTRAMUSCULAR | Status: AC
  Filled 2021-11-26: qty 10

## 2021-11-26 MED ORDER — IOHEXOL 180 MG/ML  SOLN
INTRAMUSCULAR | Status: AC
  Filled 2021-11-26: qty 20

## 2021-11-26 MED ORDER — TRIAMCINOLONE ACETONIDE 40 MG/ML IJ SUSP
INTRAMUSCULAR | Status: AC
  Filled 2021-11-26: qty 1

## 2021-11-26 MED ORDER — ROPIVACAINE HCL 2 MG/ML IJ SOLN
INTRAMUSCULAR | Status: AC
  Filled 2021-11-26: qty 20

## 2021-11-26 MED ORDER — OXYCODONE HCL 5 MG PO TABS: 5.0000 mg | ORAL_TABLET | ORAL | 0 refills | Status: DC | PRN

## 2021-11-26 MED ORDER — LIDOCAINE HCL (PF) 1 % IJ SOLN
INTRAMUSCULAR | Status: AC
  Filled 2021-11-26: qty 10

## 2021-11-26 MED ORDER — ROPIVACAINE HCL 2 MG/ML IJ SOLN
10.0000 mL | Freq: Once | INTRAMUSCULAR | Status: AC
  Administered 2021-11-26: 10 mL via EPIDURAL

## 2021-11-26 MED ORDER — OXYCODONE HCL 5 MG PO TABS: 5.0000 mg | ORAL_TABLET | ORAL | 0 refills | Status: AC | PRN

## 2021-11-26 MED ORDER — SODIUM CHLORIDE 0.9% FLUSH
10.0000 mL | Freq: Once | INTRAVENOUS | Status: AC
  Administered 2021-11-26: 10 mL

## 2021-11-26 MED ORDER — IOHEXOL 180 MG/ML  SOLN
10.0000 mL | Freq: Once | INTRAMUSCULAR | Status: AC | PRN
  Administered 2021-11-26: 10 mL via EPIDURAL

## 2021-11-26 NOTE — Progress Notes (Signed)
Nursing Pain Medication Assessment:  Safety precautions to be maintained throughout the outpatient stay will include: orient to surroundings, keep bed in low position, maintain call bell within reach at all times, provide assistance with transfer out of bed and ambulation.  Medication Inspection Compliance: Hannah Maynard did not comply with our request to bring her pills to be counted. She was reminded that bringing the medication bottles, even when empty, is a requirement.  Medication: None brought in. Pill/Patch Count: None available to be counted. Bottle Appearance: No container available. Did not bring bottle(s) to appointment. Filled Date: N/A Last Medication intake:  TodaySafety precautions to be maintained throughout the outpatient stay will include: orient to surroundings, keep bed in low position, maintain call bell within reach at all times, provide assistance with transfer out of bed and ambulation.

## 2021-11-26 NOTE — Patient Instructions (Signed)

## 2021-11-27 ENCOUNTER — Encounter: Payer: Self-pay | Admitting: Anesthesiology

## 2021-11-27 NOTE — Progress Notes (Signed)
Subjective:  Patient ID: Hannah Maynard, female    DOB: 12/03/1965  Age: 56 y.o. MRN: [id number]  CC: Back Pain (lower)   Procedure: L5-S1 epidural steroid under fluoroscopic guidance with no sedation  HPI [name] presents for reevaluation.  Hannah Maynard continues to have sciatica symptoms worse on the left side than right though at this point she feels that she is having pain on both sides.  The pain radiates down from the low back into the buttocks and down the posterior lateral legs generally greater on the right side than the left though affecting both sides at this time.  She generally gets significant improvement following epidural steroids for about 2-1/2 months before she has gradual recurrence of the same pain.  Nothing else has been as effective.  She takes her medications to help with the low back pain which generally gives her about 50% improvement with the epidural.  She reports about 50 to 70% improvement with her medications lasting about 4 to 6 hours.  Otherwise she has been in her usual state of health.  No change in upper or lower extremity strength is noted.  I have reviewed her most recent urine screen.  She has had a history of distant marijuana use but her recent urine screen came back positive for cocaine metabolites.  She is uncertain the etiology of this as she denies any usage of cocaine or other illicit drugs.  She denies any diverting use.  Outpatient Medications Prior to Visit  Medication Sig Dispense Refill   atorvastatin (LIPITOR) 10 MG tablet Take 10 mg by mouth daily.     fluticasone (FLONASE) 50 MCG/ACT nasal spray Place 1 spray into both nostrils daily.     Nerve Stimulator (ZEWA TENS/EMS COMBO UNIT) DEVI by Does not apply route.     PROAIR HFA 108 (90 Base) MCG/ACT inhaler Inhale 2 puffs into the lungs as needed.     sertraline (ZOLOFT) 100 MG tablet Take 200 mg by mouth at bedtime.      triamcinolone cream (KENALOG) 0.1 % Apply 1 application  topically 3 (three) times daily as needed.     oxyCODONE (OXY IR/ROXICODONE) 5 MG immediate release tablet TAKE 1 TABLET BY MOUTH EVERY 4 HOURS AS NEEDED FOR SEVERE PAIN 150 tablet 0   baclofen (LIORESAL) 10 MG tablet Take 10 mg by mouth 2 (two) times daily.     FLUoxetine (PROZAC) 40 MG capsule Take 40 mg by mouth daily.     ibuprofen (ADVIL,MOTRIN) 200 MG tablet Take 200 mg by mouth every 6 (six) hours as needed. (Patient not taking: Reported on 11/26/2021)     montelukast (SINGULAIR) 10 MG tablet Take 10 mg by mouth at bedtime.     pregabalin (LYRICA) 75 MG capsule Take 2 capsules (150 mg total) by mouth 2 (two) times daily. 120 capsule 3   SYMBICORT 80-4.5 MCG/ACT inhaler Inhale 2 puffs into the lungs 2 (two) times daily. (Patient not taking: Reported on 11/26/2021)     traZODone (DESYREL) 50 MG tablet Take 50 mg by mouth at bedtime.     Facility-Administered Medications Prior to Visit  Medication Dose Route Frequency Provider Last Rate Last Admin   lidocaine (PF) (XYLOCAINE) 1 % injection 5 mL  5 mL Subcutaneous Once Molli Barrows, MD       ropivacaine (PF) 2 mg/mL (0.2%) (NAROPIN) injection 10 mL  10 mL Epidural Once Molli Barrows, MD        Review of  Systems CNS: No confusion or sedation Cardiac: No angina or palpitations GI: No abdominal pain or constipation Constitutional: No nausea vomiting fevers or chills  Objective:  BP (!) 151/100   Pulse 98   Temp (!) 96.9 F (36.1 C) (Temporal)   Resp 16   Ht '5\' 6"'$  (1.676 m)   Wt 224 lb (101.6 kg)   LMP  (LMP Unknown)   SpO2 98%   BMI 36.15 kg/m    BP Readings from Last 3 Encounters:  11/26/21 (!) 151/100  10/09/21 (!) 127/91  07/03/21 (!) 147/80     Wt Readings from Last 3 Encounters:  11/26/21 224 lb (101.6 kg)  10/09/21 232 lb (105.2 kg)  07/03/21 236 lb (107 kg)     Physical Exam Pt is alert and oriented PERRL EOMI HEART IS RRR no murmur or rub LCTA no wheezing or rales MUSCULOSKELETAL reveals some  paraspinous muscle tenderness but no overt trigger points.  She does have an antalgic gait and difficulty going from seated to standing.  She has a positive straight leg raise on the right side negative on the left  Labs  No results found for: "HGBA1C" Lab Results  Component Value Date   CREATININE 0.95 05/22/2021    -------------------------------------------------------------------------------------------------------------------- Lab Results  Component Value Date   WBC 5.3 05/22/2021   HGB 13.9 05/22/2021   HCT 43.0 05/22/2021   PLT 176 05/22/2021   GLUCOSE 132 (H) 05/22/2021   ALT 21 05/21/2021   AST 27 05/21/2021   NA 140 05/22/2021   K 4.1 05/22/2021   CL 109 05/22/2021   CREATININE 0.95 05/22/2021   BUN 16 05/22/2021   CO2 24 05/22/2021    --------------------------------------------------------------------------------------------------------------------- DG PAIN CLINIC C-ARM 1-60 MIN NO REPORT  Result Date: 11/26/2021 Fluoro was used, but no Radiologist interpretation will be provided. Please refer to "NOTES" tab for provider progress note.    Assessment & Plan:   [name] was seen today for back pain.  Diagnoses and all orders for this visit:  DDD (degenerative disc disease), cervical  Chronic bilateral low back pain with bilateral sciatica -     Lumbar Epidural Injection -     triamcinolone acetonide (KENALOG-40) injection 40 mg -     sodium chloride flush (NS) 0.9 % injection 10 mL -     ropivacaine (PF) 2 mg/mL (0.2%) (NAROPIN) injection 10 mL -     lidocaine (PF) (XYLOCAINE) 1 % injection 5 mL -     iohexol (OMNIPAQUE) 180 MG/ML injection 10 mL  Right sided sciatica -     Lumbar Epidural Injection  Lumbar spondylosis -     Lumbar Epidural Injection -     triamcinolone acetonide (KENALOG-40) injection 40 mg -     sodium chloride flush (NS) 0.9 % injection 10 mL -     ropivacaine (PF) 2 mg/mL (0.2%) (NAROPIN) injection 10 mL -     lidocaine (PF)  (XYLOCAINE) 1 % injection 5 mL -     iohexol (OMNIPAQUE) 180 MG/ML injection 10 mL  DDD (degenerative disc disease), lumbar -     Lumbar Epidural Injection -     triamcinolone acetonide (KENALOG-40) injection 40 mg -     sodium chloride flush (NS) 0.9 % injection 10 mL -     ropivacaine (PF) 2 mg/mL (0.2%) (NAROPIN) injection 10 mL -     lidocaine (PF) (XYLOCAINE) 1 % injection 5 mL -     iohexol (OMNIPAQUE) 180 MG/ML injection 10 mL  Spinal  stenosis of lumbar region with neurogenic claudication  Cervicalgia  Chronic pain syndrome -     ToxASSURE Select 13 (MW), Urine  Long term current use of opiate analgesic -     ToxASSURE Select 13 (MW), Urine  Other orders -     oxyCODONE (OXY IR/ROXICODONE) 5 MG immediate release tablet; Take 1 tablet (5 mg total) by mouth every 4 (four) hours as needed for severe pain. -     oxyCODONE (ROXICODONE) 5 MG immediate release tablet; Take 1 tablet (5 mg total) by mouth every 4 (four) hours as needed for severe pain.        ----------------------------------------------------------------------------------------------------------------------  Problem List Items Addressed This Visit       Unprioritized   Chronic low back pain (Chronic)   Relevant Medications   baclofen (LIORESAL) 10 MG tablet   FLUoxetine (PROZAC) 40 MG capsule   oxyCODONE (OXY IR/ROXICODONE) 5 MG immediate release tablet (Start on 12/19/2021)   oxyCODONE (ROXICODONE) 5 MG immediate release tablet (Start on 01/18/2022)   Cervicalgia   Chronic pain syndrome   Relevant Medications   baclofen (LIORESAL) 10 MG tablet   FLUoxetine (PROZAC) 40 MG capsule   oxyCODONE (OXY IR/ROXICODONE) 5 MG immediate release tablet (Start on 12/19/2021)   oxyCODONE (ROXICODONE) 5 MG immediate release tablet (Start on 01/18/2022)   Other Relevant Orders   ToxASSURE Select 13 (MW), Urine   DDD (degenerative disc disease), cervical - Primary   Relevant Medications   baclofen (LIORESAL) 10 MG  tablet   oxyCODONE (OXY IR/ROXICODONE) 5 MG immediate release tablet (Start on 12/19/2021)   oxyCODONE (ROXICODONE) 5 MG immediate release tablet (Start on 01/18/2022)   Long term current use of opiate analgesic   Relevant Orders   ToxASSURE Select 13 (MW), Urine   Lumbar spondylosis   Relevant Medications   baclofen (LIORESAL) 10 MG tablet   oxyCODONE (OXY IR/ROXICODONE) 5 MG immediate release tablet (Start on 12/19/2021)   oxyCODONE (ROXICODONE) 5 MG immediate release tablet (Start on 01/18/2022)   Other Visit Diagnoses     Right sided sciatica       Relevant Medications   baclofen (LIORESAL) 10 MG tablet   FLUoxetine (PROZAC) 40 MG capsule   DDD (degenerative disc disease), lumbar       Relevant Medications   baclofen (LIORESAL) 10 MG tablet   triamcinolone acetonide (KENALOG-40) injection 40 mg (Completed)   sodium chloride flush (NS) 0.9 % injection 10 mL (Completed)   ropivacaine (PF) 2 mg/mL (0.2%) (NAROPIN) injection 10 mL (Completed)   lidocaine (PF) (XYLOCAINE) 1 % injection 5 mL (Completed)   iohexol (OMNIPAQUE) 180 MG/ML injection 10 mL (Completed)   oxyCODONE (OXY IR/ROXICODONE) 5 MG immediate release tablet (Start on 12/19/2021)   oxyCODONE (ROXICODONE) 5 MG immediate release tablet (Start on 01/18/2022)   Spinal stenosis of lumbar region with neurogenic claudication       Relevant Medications   baclofen (LIORESAL) 10 MG tablet   FLUoxetine (PROZAC) 40 MG capsule   triamcinolone acetonide (KENALOG-40) injection 40 mg (Completed)   ropivacaine (PF) 2 mg/mL (0.2%) (NAROPIN) injection 10 mL (Completed)   lidocaine (PF) (XYLOCAINE) 1 % injection 5 mL (Completed)   oxyCODONE (OXY IR/ROXICODONE) 5 MG immediate release tablet (Start on 12/19/2021)   oxyCODONE (ROXICODONE) 5 MG immediate release tablet (Start on 01/18/2022)         ----------------------------------------------------------------------------------------------------------------------  1. Chronic  bilateral low back pain with bilateral sciatica We will proceed with a repeat epidural today.  She has no  questions about the procedure and voices understanding.  Continue core stretching strengthening exercises. - Lumbar Epidural Injection - triamcinolone acetonide (KENALOG-40) injection 40 mg - sodium chloride flush (NS) 0.9 % injection 10 mL - ropivacaine (PF) 2 mg/mL (0.2%) (NAROPIN) injection 10 mL - lidocaine (PF) (XYLOCAINE) 1 % injection 5 mL - iohexol (OMNIPAQUE) 180 MG/ML injection 10 mL  2. Right sided sciatica As above - Lumbar Epidural Injection  3. Lumbar spondylosis As above - Lumbar Epidural Injection - triamcinolone acetonide (KENALOG-40) injection 40 mg - sodium chloride flush (NS) 0.9 % injection 10 mL - ropivacaine (PF) 2 mg/mL (0.2%) (NAROPIN) injection 10 mL - lidocaine (PF) (XYLOCAINE) 1 % injection 5 mL - iohexol (OMNIPAQUE) 180 MG/ML injection 10 mL  4. DDD (degenerative disc disease), lumbar  - Lumbar Epidural Injection - triamcinolone acetonide (KENALOG-40) injection 40 mg - sodium chloride flush (NS) 0.9 % injection 10 mL - ropivacaine (PF) 2 mg/mL (0.2%) (NAROPIN) injection 10 mL - lidocaine (PF) (XYLOCAINE) 1 % injection 5 mL - iohexol (OMNIPAQUE) 180 MG/ML injection 10 mL  5. DDD (degenerative disc disease), cervical   6. Spinal stenosis of lumbar region with neurogenic claudication As above and continue efforts at weight loss  7. Cervicalgia   8. Chronic pain syndrome I had a long discussion with her regarding her most recent urine screen.  She has been a good patient and compliant on her regimen with no evidence of problems in the past.  She has had a positive THC which she attributes to CBD oil.  I have voiced concern regarding this new information with her urine screen and will request repeat urine tox screen going forward to we have evidence that this is not problematic.  She is aware of the risks associated with chronic opioid therapy and  usage of illicit drugs and the potential for adverse drug interaction and respiratory depression and death.  She will present for repeat urine screen in 1 month. - ToxASSURE Select 13 (MW), Urine  9. Long term current use of opiate analgesic As above - ToxASSURE Select 13 (MW), Urine    ----------------------------------------------------------------------------------------------------------------------  I have changed Raquell R. Ledonne's oxyCODONE. I am also having her start on oxyCODONE. Additionally, I am having her maintain her ibuprofen, [place] TENS/EMS Combo Unit, ProAir HFA, montelukast, sertraline, Symbicort, atorvastatin, fluticasone, traZODone, triamcinolone cream, pregabalin, baclofen, and FLUoxetine. We administered triamcinolone acetonide, sodium chloride flush, ropivacaine (PF) 2 mg/mL (0.2%), lidocaine (PF), and iohexol. We will continue to administer ropivacaine (PF) 2 mg/mL (0.2%) and lidocaine (PF).   Meds ordered this encounter  Medications   triamcinolone acetonide (KENALOG-40) injection 40 mg   sodium chloride flush (NS) 0.9 % injection 10 mL   ropivacaine (PF) 2 mg/mL (0.2%) (NAROPIN) injection 10 mL   lidocaine (PF) (XYLOCAINE) 1 % injection 5 mL   iohexol (OMNIPAQUE) 180 MG/ML injection 10 mL   oxyCODONE (OXY IR/ROXICODONE) 5 MG immediate release tablet    Sig: Take 1 tablet (5 mg total) by mouth every 4 (four) hours as needed for severe pain.    Dispense:  150 tablet    Refill:  0   oxyCODONE (ROXICODONE) 5 MG immediate release tablet    Sig: Take 1 tablet (5 mg total) by mouth every 4 (four) hours as needed for severe pain.    Dispense:  150 tablet    Refill:  0   Patient's Medications  New Prescriptions   OXYCODONE (ROXICODONE) 5 MG IMMEDIATE RELEASE TABLET    Take 1 tablet (5  mg total) by mouth every 4 (four) hours as needed for severe pain.  Previous Medications   ATORVASTATIN (LIPITOR) 10 MG TABLET    Take 10 mg by mouth daily.   BACLOFEN (LIORESAL) 10  MG TABLET    Take 10 mg by mouth 2 (two) times daily.   FLUOXETINE (PROZAC) 40 MG CAPSULE    Take 40 mg by mouth daily.   FLUTICASONE (FLONASE) 50 MCG/ACT NASAL SPRAY    Place 1 spray into both nostrils daily.   IBUPROFEN (ADVIL,MOTRIN) 200 MG TABLET    Take 200 mg by mouth every 6 (six) hours as needed.   MONTELUKAST (SINGULAIR) 10 MG TABLET    Take 10 mg by mouth at bedtime.   NERVE STIMULATOR (ZEWA TENS/EMS COMBO UNIT) DEVI    by Does not apply route.   PREGABALIN (LYRICA) 75 MG CAPSULE    Take 2 capsules (150 mg total) by mouth 2 (two) times daily.   PROAIR HFA 108 (90 BASE) MCG/ACT INHALER    Inhale 2 puffs into the lungs as needed.   SERTRALINE (ZOLOFT) 100 MG TABLET    Take 200 mg by mouth at bedtime.    SYMBICORT 80-4.5 MCG/ACT INHALER    Inhale 2 puffs into the lungs 2 (two) times daily.   TRAZODONE (DESYREL) 50 MG TABLET    Take 50 mg by mouth at bedtime.   TRIAMCINOLONE CREAM (KENALOG) 0.1 %    Apply 1 application topically 3 (three) times daily as needed.  Modified Medications   Modified Medication Previous Medication   OXYCODONE (OXY IR/ROXICODONE) 5 MG IMMEDIATE RELEASE TABLET oxyCODONE (OXY IR/ROXICODONE) 5 MG immediate release tablet      Take 1 tablet (5 mg total) by mouth every 4 (four) hours as needed for severe pain.    TAKE 1 TABLET BY MOUTH EVERY 4 HOURS AS NEEDED FOR SEVERE PAIN  Discontinued Medications   No medications on file   ----------------------------------------------------------------------------------------------------------------------  Follow-up: Return in about 2 months (around 01/26/2022) for evaluation, med refill.   Procedure: L5-S1 LESI with fluoroscopic guidance and no moderate sedation  NOTE: The risks, benefits, and expectations of the procedure have been discussed and explained to the patient who was understanding and in agreement with suggested treatment plan. No guarantees were made.  DESCRIPTION OF PROCEDURE: Lumbar epidural steroid injection  with no IV Versed, EKG, blood pressure, pulse, and pulse oximetry monitoring. The procedure was performed with the patient in the prone position under fluoroscopic guidance.  Sterile prep x3 was initiated and I then injected subcutaneous lidocaine to the overlying 5 S1 site after its fluoroscopic identifictation.  Using strict aseptic technique, I then advanced an 18-gauge Tuohy epidural needle in the midline using interlaminar approach via loss-of-resistance to saline technique. There was negative aspiration for heme or  CSF.  I then confirmed position with both AP and Lateral fluoroscan.  2 cc of contrast dye were injected and a  total of 5 mL of Preservative-Free normal saline mixed with 40 mg of Kenalog and 1cc Ropicaine 0.2 percent were injected incrementally via the  epidurally placed needle. The needle was removed. The patient tolerated the injection well and was convalesced and discharged to home in stable condition. Should the patient have any post procedure difficulty they have been instructed on how to contact us for assistance.    Molli Barrows, MD

## 2021-11-29 ENCOUNTER — Ambulatory Visit: Payer: Medicaid Other

## 2021-11-30 ENCOUNTER — Ambulatory Visit
Admission: RE | Admit: 2021-11-30 | Discharge: 2021-11-30 | Disposition: A | Discharge status: Home / Self Care | Payer: Medicaid Other | Source: Ambulatory Visit | Attending: Family Medicine | Admitting: Family Medicine

## 2021-11-30 DIAGNOSIS — Z1231 Encounter for screening mammogram for malignant neoplasm of breast: Secondary | ICD-10-CM | POA: Diagnosis present

## 2021-12-03 ENCOUNTER — Other Ambulatory Visit: Payer: Self-pay | Admitting: Family Medicine

## 2021-12-04 ENCOUNTER — Other Ambulatory Visit: Payer: Self-pay | Admitting: Family Medicine

## 2021-12-04 DIAGNOSIS — R928 Other abnormal and inconclusive findings on diagnostic imaging of breast: Secondary | ICD-10-CM

## 2021-12-04 DIAGNOSIS — R921 Mammographic calcification found on diagnostic imaging of breast: Secondary | ICD-10-CM

## 2021-12-06 ENCOUNTER — Other Ambulatory Visit: Payer: Self-pay | Admitting: Anesthesiology

## 2021-12-07 ENCOUNTER — Ambulatory Visit: Admission: RE | Admit: 2021-12-07 | Payer: Medicaid Other | Source: Ambulatory Visit

## 2021-12-18 ENCOUNTER — Ambulatory Visit
Admission: RE | Admit: 2021-12-18 | Discharge: 2021-12-18 | Disposition: A | Discharge status: Home / Self Care | Payer: Medicaid Other | Source: Ambulatory Visit | Attending: Family Medicine | Admitting: Family Medicine

## 2021-12-18 ENCOUNTER — Other Ambulatory Visit: Payer: Self-pay | Admitting: Anesthesiology

## 2021-12-18 DIAGNOSIS — R921 Mammographic calcification found on diagnostic imaging of breast: Secondary | ICD-10-CM | POA: Insufficient documentation

## 2021-12-18 DIAGNOSIS — R928 Other abnormal and inconclusive findings on diagnostic imaging of breast: Secondary | ICD-10-CM | POA: Diagnosis present

## 2022-01-01 ENCOUNTER — Other Ambulatory Visit: Payer: Self-pay | Admitting: Anesthesiology

## 2022-01-01 LAB — TOXASSURE SELECT 13 (MW), URINE

## 2022-01-21 ENCOUNTER — Telehealth: Payer: Self-pay

## 2022-01-21 ENCOUNTER — Telehealth: Payer: Self-pay | Admitting: Anesthesiology

## 2022-01-21 NOTE — Telephone Encounter (Signed)
The patient wants to know if her prior Hannah Maynard has been done for her medicine.

## 2022-01-21 NOTE — Telephone Encounter (Signed)
Pateint states meds need PA

## 2022-01-21 NOTE — Telephone Encounter (Signed)
Yes, it has been done this AM. Patient called and informed.

## 2022-01-21 NOTE — Telephone Encounter (Signed)
PA done

## 2022-01-22 ENCOUNTER — Ambulatory Visit: Payer: Medicaid Other | Attending: Anesthesiology | Admitting: Anesthesiology

## 2022-01-22 ENCOUNTER — Encounter: Payer: Self-pay | Admitting: Anesthesiology

## 2022-01-22 DIAGNOSIS — M47816 Spondylosis without myelopathy or radiculopathy, lumbar region: Secondary | ICD-10-CM

## 2022-01-22 DIAGNOSIS — M5441 Lumbago with sciatica, right side: Secondary | ICD-10-CM

## 2022-01-22 DIAGNOSIS — M5136 Other intervertebral disc degeneration, lumbar region: Secondary | ICD-10-CM

## 2022-01-22 DIAGNOSIS — G894 Chronic pain syndrome: Secondary | ICD-10-CM

## 2022-01-22 DIAGNOSIS — M48062 Spinal stenosis, lumbar region with neurogenic claudication: Secondary | ICD-10-CM

## 2022-01-22 DIAGNOSIS — M5442 Lumbago with sciatica, left side: Secondary | ICD-10-CM | POA: Diagnosis not present

## 2022-01-22 DIAGNOSIS — M545 Low back pain, unspecified: Secondary | ICD-10-CM

## 2022-01-22 DIAGNOSIS — M542 Cervicalgia: Secondary | ICD-10-CM

## 2022-01-22 DIAGNOSIS — M5030 Other cervical disc degeneration, unspecified cervical region: Secondary | ICD-10-CM

## 2022-01-22 DIAGNOSIS — M5431 Sciatica, right side: Secondary | ICD-10-CM | POA: Diagnosis not present

## 2022-01-22 DIAGNOSIS — G8929 Other chronic pain: Secondary | ICD-10-CM

## 2022-01-22 MED ORDER — OXYCODONE HCL 5 MG PO TABS: 5.0000 mg | ORAL_TABLET | ORAL | 0 refills | Status: DC | PRN

## 2022-01-22 MED ORDER — PREGABALIN 75 MG PO CAPS: 150.0000 mg | ORAL_CAPSULE | Freq: Two times a day (BID) | ORAL | 3 refills | Status: DC
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
## 2022-02-08 ENCOUNTER — Telehealth: Payer: Self-pay

## 2022-02-08 NOTE — Telephone Encounter (Signed)
Her insurance needs a letter stating that she is a chronic long term patient needing procedures for the chronic pain. She wants to get in for a procedure before Christmas, so please do this asap so I can work on the authorization.

## 2022-02-12 ENCOUNTER — Ambulatory Visit: Payer: Medicaid Other | Attending: Anesthesiology | Admitting: Anesthesiology

## 2022-02-12 ENCOUNTER — Encounter: Payer: Self-pay | Admitting: Anesthesiology

## 2022-02-12 DIAGNOSIS — M51369 Other intervertebral disc degeneration, lumbar region without mention of lumbar back pain or lower extremity pain: Secondary | ICD-10-CM

## 2022-02-12 DIAGNOSIS — G8929 Other chronic pain: Secondary | ICD-10-CM

## 2022-02-12 DIAGNOSIS — M5431 Sciatica, right side: Secondary | ICD-10-CM | POA: Diagnosis not present

## 2022-02-12 DIAGNOSIS — M5030 Other cervical disc degeneration, unspecified cervical region: Secondary | ICD-10-CM

## 2022-02-12 DIAGNOSIS — M542 Cervicalgia: Secondary | ICD-10-CM

## 2022-02-12 DIAGNOSIS — M5136 Other intervertebral disc degeneration, lumbar region: Secondary | ICD-10-CM | POA: Diagnosis not present

## 2022-02-12 DIAGNOSIS — M5432 Sciatica, left side: Secondary | ICD-10-CM | POA: Diagnosis not present

## 2022-02-12 DIAGNOSIS — M545 Low back pain, unspecified: Secondary | ICD-10-CM

## 2022-02-12 DIAGNOSIS — M5442 Lumbago with sciatica, left side: Secondary | ICD-10-CM | POA: Diagnosis not present

## 2022-02-12 DIAGNOSIS — M48062 Spinal stenosis, lumbar region with neurogenic claudication: Secondary | ICD-10-CM

## 2022-02-12 DIAGNOSIS — G894 Chronic pain syndrome: Secondary | ICD-10-CM

## 2022-02-12 DIAGNOSIS — M47816 Spondylosis without myelopathy or radiculopathy, lumbar region: Secondary | ICD-10-CM | POA: Diagnosis not present

## 2022-02-12 DIAGNOSIS — M5441 Lumbago with sciatica, right side: Secondary | ICD-10-CM

## 2022-02-12 MED ORDER — OXYCODONE HCL 5 MG PO TABS: 5.0000 mg | ORAL_TABLET | ORAL | 0 refills | Status: AC | PRN

## 2022-02-12 MED ORDER — OXYCODONE HCL 5 MG PO TABS: 5.0000 mg | ORAL_TABLET | ORAL | 0 refills | Status: DC | PRN
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
## 2022-02-13 NOTE — Patient Instructions (Signed)
______________________________________________________________________  Preparing for your procedure  During your procedure appointment there will be: No Prescription Refills. No disability issues to discussed. No medication changes or discussions.  Instructions: Food intake: Avoid eating anything solid for at least 8 hours prior to your procedure. Clear liquid intake: You may take clear liquids such as water up to 2 hours prior to your procedure. (No carbonated drinks. No soda.) Transportation: Unless otherwise stated by your physician, bring a driver. Morning Medicines: Except for blood thinners, take all of your other morning medications with a sip of water. Make sure to take your heart and blood pressure medicines. If your blood pressure's lower number is above 100, the case will be rescheduled. Blood thinners: If you take a blood thinner, but were not instructed to stop it, call our office [phone] and ask to talk to a nurse. Not stopping a blood thinner prior to certain procedures could lead to serious complications. Diabetics on insulin: Notify the staff so that you can be scheduled 1st case in the morning. If your diabetes requires high dose insulin, take only  of your normal insulin dose the morning of the procedure and notify the staff that you have done so. Preventing infections: Shower with an antibacterial soap the morning of your procedure.  Build-up your immune system: Take 1000 mg of Vitamin C with every meal (3 times a day) the day prior to your procedure. Antibiotics: Inform the nursing staff if you are taking any antibiotics or if you have any conditions that may require antibiotics prior to procedures. (Example: recent joint implants)   Pregnancy: If you are pregnant make sure to notify the nursing staff. Not doing so may result in injury to the fetus, including death.  Sickness: If you have a cold, fever, or any active infections, call and cancel or reschedule your  procedure. Receiving steroids while having an infection may result in complications. Arrival: You must be in the facility at least 30 minutes prior to your scheduled procedure. Tardiness: Your scheduled time is also the cutoff time. If you do not arrive at least 15 minutes prior to your procedure, you will be rescheduled.  Children: Do not bring any children with you. Make arrangements to keep them home. Dress appropriately: There is always a possibility that your clothing may get soiled. Avoid long dresses. Valuables: Do not bring any jewelry or valuables.  Reasons to call and reschedule or cancel your procedure: (Following these recommendations will minimize the risk of a serious complication.) Surgeries: Avoid having procedures within 2 weeks of any surgery. (Avoid for 2 weeks before or after any surgery). Flu Shots: Avoid having procedures within 2 weeks of a flu shots or . (Avoid for 2 weeks before or after immunizations). Barium: Avoid having a procedure within 7-10 days after having had a radiological study involving the use of radiological contrast. (Myelograms, Barium swallow or enema study). Heart attacks: Avoid any elective procedures or surgeries for the initial 6 months after a "Myocardial Infarction" (Heart Attack). Blood thinners: It is imperative that you stop these medications before procedures. Let us know if you if you take any blood thinner.  Infection: Avoid procedures during or within two weeks of an infection (including chest colds or gastrointestinal problems). Symptoms associated with infections include: Localized redness, fever, chills, night sweats or profuse sweating, burning sensation when voiding, cough, congestion, stuffiness, runny nose, sore throat, diarrhea, nausea, vomiting, cold or Flu symptoms, recent or current infections. It is specially important if the infection is   over the area that we intend to treat. Heart and lung problems: Symptoms that may suggest an  active cardiopulmonary problem include: cough, chest pain, breathing difficulties or shortness of breath, dizziness, ankle swelling, uncontrolled high or unusually low blood pressure, and/or palpitations. If you are experiencing any of these symptoms, cancel your procedure and contact your primary care physician for an evaluation.  Remember:  Regular Business hours are:  Monday to Thursday 8:00 AM to 4:00 PM  Provider's Schedule: Milinda Pointer, MD:  Procedure days: Tuesday and Thursday 7:30 AM to 4:00 PM  Gillis Santa, MD:  Procedure days: Monday and Wednesday 7:30 AM to 4:00 PM  ______________________________________________________________________

## 2022-04-04 ENCOUNTER — Ambulatory Visit
Admission: RE | Admit: 2022-04-04 | Discharge: 2022-04-04 | Disposition: A | Discharge status: Home / Self Care | Payer: Medicaid Other | Source: Ambulatory Visit | Attending: Anesthesiology | Admitting: Anesthesiology

## 2022-04-04 ENCOUNTER — Other Ambulatory Visit: Payer: Self-pay | Admitting: Anesthesiology

## 2022-04-04 ENCOUNTER — Encounter: Payer: Self-pay | Admitting: Anesthesiology

## 2022-04-04 ENCOUNTER — Ambulatory Visit (HOSPITAL_BASED_OUTPATIENT_CLINIC_OR_DEPARTMENT_OTHER): Payer: Medicaid Other | Admitting: Anesthesiology

## 2022-04-04 VITALS — BP 135/102 | HR 79 | Temp 97.8°F | Resp 18 | Ht 66.0 in | Wt 234.0 lb

## 2022-04-04 DIAGNOSIS — M542 Cervicalgia: Secondary | ICD-10-CM

## 2022-04-04 DIAGNOSIS — M48062 Spinal stenosis, lumbar region with neurogenic claudication: Secondary | ICD-10-CM | POA: Diagnosis not present

## 2022-04-04 DIAGNOSIS — R52 Pain, unspecified: Secondary | ICD-10-CM | POA: Diagnosis present

## 2022-04-04 DIAGNOSIS — M47816 Spondylosis without myelopathy or radiculopathy, lumbar region: Secondary | ICD-10-CM | POA: Insufficient documentation

## 2022-04-04 DIAGNOSIS — M5441 Lumbago with sciatica, right side: Secondary | ICD-10-CM | POA: Insufficient documentation

## 2022-04-04 DIAGNOSIS — G894 Chronic pain syndrome: Secondary | ICD-10-CM

## 2022-04-04 DIAGNOSIS — G8929 Other chronic pain: Secondary | ICD-10-CM | POA: Insufficient documentation

## 2022-04-04 DIAGNOSIS — M5136 Other intervertebral disc degeneration, lumbar region: Secondary | ICD-10-CM

## 2022-04-04 DIAGNOSIS — M5030 Other cervical disc degeneration, unspecified cervical region: Secondary | ICD-10-CM

## 2022-04-04 DIAGNOSIS — M5442 Lumbago with sciatica, left side: Secondary | ICD-10-CM | POA: Insufficient documentation

## 2022-04-04 MED ORDER — SODIUM CHLORIDE 0.9% FLUSH
10.0000 mL | Freq: Once | INTRAVENOUS | Status: AC
  Administered 2022-04-04: 10 mL

## 2022-04-04 MED ORDER — LIDOCAINE HCL (PF) 1 % IJ SOLN
INTRAMUSCULAR | Status: AC
  Filled 2022-04-04: qty 10

## 2022-04-04 MED ORDER — TRIAMCINOLONE ACETONIDE 40 MG/ML IJ SUSP
40.0000 mg | Freq: Once | INTRAMUSCULAR | Status: AC
  Administered 2022-04-04: 40 mg

## 2022-04-04 MED ORDER — SODIUM CHLORIDE (PF) 0.9 % IJ SOLN
INTRAMUSCULAR | Status: AC
  Filled 2022-04-04: qty 10

## 2022-04-04 MED ORDER — OXYCODONE HCL 5 MG PO TABS: 5.0000 mg | ORAL_TABLET | ORAL | 0 refills | Status: AC | PRN

## 2022-04-04 MED ORDER — OXYCODONE HCL 5 MG PO TABS: 5.0000 mg | ORAL_TABLET | ORAL | 0 refills | Status: DC | PRN

## 2022-04-04 MED ORDER — ROPIVACAINE HCL 2 MG/ML IJ SOLN
10.0000 mL | Freq: Once | INTRAMUSCULAR | Status: AC
  Administered 2022-04-04: 1 mL via EPIDURAL

## 2022-04-04 MED ORDER — TRIAMCINOLONE ACETONIDE 40 MG/ML IJ SUSP
INTRAMUSCULAR | Status: AC
  Filled 2022-04-04: qty 1

## 2022-04-04 MED ORDER — IOHEXOL 180 MG/ML  SOLN
INTRAMUSCULAR | Status: AC
  Filled 2022-04-04: qty 20

## 2022-04-04 MED ORDER — LIDOCAINE HCL (PF) 1 % IJ SOLN
5.0000 mL | Freq: Once | INTRAMUSCULAR | Status: AC
  Administered 2022-04-04: 5 mL via SUBCUTANEOUS

## 2022-04-04 MED ORDER — IOHEXOL 180 MG/ML  SOLN
10.0000 mL | Freq: Once | INTRAMUSCULAR | Status: AC | PRN
  Administered 2022-04-04: 10 mL via EPIDURAL

## 2022-04-04 MED ORDER — ROPIVACAINE HCL 2 MG/ML IJ SOLN
INTRAMUSCULAR | Status: AC
  Filled 2022-04-04: qty 20

## 2022-04-04 NOTE — Patient Instructions (Signed)
Pain Management Discharge Instructions  General Discharge Instructions :  If you need to reach your doctor call: Monday-Friday 8:00 am - 4:00 pm at [phone] or toll free [phone].  After clinic hours [phone] to have operator reach doctor.  Bring all of your medication bottles to all your appointments in the pain clinic.  To cancel or reschedule your appointment with Pain Management please remember to call 24 hours in advance to avoid a fee.  Refer to the educational materials which you have been given on: General Risks, I had my Procedure. Discharge Instructions, Post Sedation.  Post Procedure Instructions:  The drugs you were given will stay in your system until tomorrow, so for the next 24 hours you should not drive, make any legal decisions or drink any alcoholic beverages.  You may eat anything you prefer, but it is better to start with liquids then soups and crackers, and gradually work up to solid foods.  Please notify your doctor immediately if you have any unusual bleeding, trouble breathing or pain that is not related to your normal pain.  Depending on the type of procedure that was done, some parts of your body may feel week and/or numb.  This usually clears up by tonight or the next day.  Walk with the use of an assistive device or accompanied by an adult for the 24 hours.  You may use ice on the affected area for the first 24 hours.  Put ice in a Ziploc bag and cover with a towel and place against area 15 minutes on 15 minutes off.  You may switch to heat after 24 hours.Epidural Steroid Injection Patient Information  Description: The epidural space surrounds the nerves as they exit the spinal cord.  In some patients, the nerves can be compressed and inflamed by a bulging disc or a tight spinal canal (spinal stenosis).  By injecting steroids into the epidural space, we can bring irritated nerves into direct contact with a potentially helpful medication.  These  steroids act directly on the irritated nerves and can reduce swelling and inflammation which often leads to decreased pain.  Epidural steroids may be injected anywhere along the spine and from the neck to the low back depending upon the location of your pain.   After numbing the skin with local anesthetic (like Novocaine), a small needle is passed into the epidural space slowly.  You may experience a sensation of pressure while this is being done.  The entire block usually last less than 10 minutes.  Conditions which may be treated by epidural steroids:  Low back and leg pain Neck and arm pain Spinal stenosis Post-laminectomy syndrome Herpes zoster (shingles) pain Pain from compression fractures  Preparation for the injection:  Do not eat any solid food or dairy products within 8 hours of your appointment.  You may drink clear liquids up to 3 hours before appointment.  Clear liquids include water, black coffee, juice or soda.  No milk or cream please. You may take your regular medication, including pain medications, with a sip of water before your appointment  Diabetics should hold regular insulin (if taken separately) and take 1/2 normal NPH dos the morning of the procedure.  Carry some sugar containing items with you to your appointment. A driver must accompany you and be prepared to drive you home after your procedure.  Bring all your current medications with your. An IV may be inserted and sedation may be given at the discretion of the physician.  A blood pressure cuff, EKG and other monitors will often be applied during the procedure.  Some patients may need to have extra oxygen administered for a short period. You will be asked to provide medical information, including your allergies, prior to the procedure.  We must know immediately if you are taking blood thinners (like Coumadin/Warfarin)  Or if you are allergic to IV iodine contrast (dye). We must know if you could possible be  pregnant.  Possible side-effects: Bleeding from needle site Infection (rare, may require surgery) Nerve injury (rare) Numbness & tingling (temporary) Difficulty urinating (rare, temporary) Spinal headache ( a headache worse with upright posture) Light -headedness (temporary) Pain at injection site (several days) Decreased blood pressure (temporary) Weakness in arm/leg (temporary) Pressure sensation in back/neck (temporary)  Call if you experience: Fever/chills associated with headache or increased back/neck pain. Headache worsened by an upright position. New onset weakness or numbness of an extremity below the injection site Hives or difficulty breathing (go to the emergency room) Inflammation or drainage at the infection site Severe back/neck pain Any new symptoms which are concerning to you  Please note:  Although the local anesthetic injected can often make your back or neck feel good for several hours after the injection, the pain will likely return.  It takes 3-7 days for steroids to work in the epidural space.  You may not notice any pain relief for at least that one week.  If effective, we will often do a series of three injections spaced 3-6 weeks apart to maximally decrease your pain.  After the initial series, we generally will wait several months before considering a repeat injection of the same type.  If you have any questions, please call [phone] [facility]

## 2022-04-08 NOTE — Progress Notes (Signed)
Subjective:  Patient ID: Hannah Maynard, female    DOB: 21-Jan-1966  Age: 57 y.o. MRN: [id number]  CC: Back Pain and Leg Pain   Procedure: L5-S1 epidural steroid under fluoroscopic guidance with no sedation  HPI Hannah Maynard presents for reevaluation.  She is continue to have a lot of sciatica symptoms and severe low back pain which has been a recent exacerbation over the past month or so.  Her last epidurals in 2023 were January May and October and she presents today requesting a repeat epidural.  She generally reports about 75% reduction of her low back pain and nearly 100% reduction in her leg pain for about 2 months following the injection knocking her pain score down to a 0 or 1 for this period of time.  She does still have some hip buttock pain for which she takes chronic opioids especially when she is in between epidural injections.  At this stage she is having a significant increase in the pain and desires to proceed with an epidural injection today.  She has been off of her anticoagulants for 10 days as reported.  Otherwise she is in her usual state of health and no new changes lower extremity strength function bowel or bladder function and no side effects with the medication management which she is taking.  She continues to get good relief with her opioid dosing taking her oxycodone about every 4 hours or so.  Outpatient Medications Prior to Visit  Medication Sig Dispense Refill   atorvastatin (LIPITOR) 10 MG tablet Take 10 mg by mouth daily.     baclofen (LIORESAL) 10 MG tablet Take 10 mg by mouth 2 (two) times daily.     FLUoxetine (PROZAC) 40 MG capsule Take 40 mg by mouth daily.     fluticasone (FLONASE) 50 MCG/ACT nasal spray Place 1 spray into both nostrils daily.     ibuprofen (ADVIL,MOTRIN) 200 MG tablet Take 200 mg by mouth every 6 (six) hours as needed.     Nerve Stimulator (ZEWA TENS/EMS COMBO UNIT) DEVI by Does not apply route.     pregabalin (LYRICA) 75 MG capsule  Take 2 capsules (150 mg total) by mouth 2 (two) times daily. 120 capsule 3   PROAIR HFA 108 (90 Base) MCG/ACT inhaler Inhale 2 puffs into the lungs as needed.     sertraline (ZOLOFT) 100 MG tablet Take 200 mg by mouth at bedtime.      SYMBICORT 80-4.5 MCG/ACT inhaler Inhale 2 puffs into the lungs 2 (two) times daily.     triamcinolone cream (KENALOG) 0.1 % Apply 1 application topically 3 (three) times daily as needed.     oxyCODONE (ROXICODONE) 5 MG immediate release tablet Take 1 tablet (5 mg total) by mouth every 4 (four) hours as needed for severe pain. 150 tablet 0   montelukast (SINGULAIR) 10 MG tablet Take 10 mg by mouth at bedtime.     pregabalin (LYRICA) 75 MG capsule Take 2 capsules (150 mg total) by mouth 2 (two) times daily. 120 capsule 3   traZODone (DESYREL) 50 MG tablet Take 50 mg by mouth at bedtime.     Facility-Administered Medications Prior to Visit  Medication Dose Route Frequency Provider Last Rate Last Admin   lidocaine (PF) (XYLOCAINE) 1 % injection 5 mL  5 mL Subcutaneous Once Molli Barrows, MD       ropivacaine (PF) 2 mg/mL (0.2%) (NAROPIN) injection 10 mL  10 mL Epidural Once Molli Barrows, MD  Review of Systems CNS: No confusion or sedation Cardiac: No angina or palpitations GI: No abdominal pain or constipation Constitutional: No nausea vomiting fevers or chills  Objective:  BP (!) 135/102   Pulse 79   Temp 97.8 F (36.6 C) (Temporal)   Resp 18   Ht 5' 6"$  (1.676 m)   Wt 234 lb (106.1 kg)   LMP  (LMP Unknown)   SpO2 97%   BMI 37.77 kg/m    BP Readings from Last 3 Encounters:  04/04/22 (!) 135/102  11/26/21 (!) 151/100  10/09/21 (!) 127/91     Wt Readings from Last 3 Encounters:  04/04/22 234 lb (106.1 kg)  11/26/21 224 lb (101.6 kg)  10/09/21 232 lb (105.2 kg)     Physical Exam Pt is alert and oriented PERRL EOMI HEART IS RRR no murmur or rub LCTA no wheezing or rales MUSCULOSKELETAL reveals some mild paraspinous muscle  tenderness but no overt trigger points.  She does have a positive straight leg raise on the right side somewhat equivocal on the left.  Muscle tone and bulk is at baseline.  She walks with a very antalgic gait.  Labs  No results found for: "HGBA1C" Lab Results  Component Value Date   CREATININE 0.95 05/22/2021    -------------------------------------------------------------------------------------------------------------------- Lab Results  Component Value Date   WBC 5.3 05/22/2021   HGB 13.9 05/22/2021   HCT 43.0 05/22/2021   PLT 176 05/22/2021   GLUCOSE 132 (H) 05/22/2021   ALT 21 05/21/2021   AST 27 05/21/2021   NA 140 05/22/2021   K 4.1 05/22/2021   CL 109 05/22/2021   CREATININE 0.95 05/22/2021   BUN 16 05/22/2021   CO2 24 05/22/2021    --------------------------------------------------------------------------------------------------------------------- DG PAIN CLINIC C-ARM 1-60 MIN NO REPORT  Result Date: 04/04/2022 Fluoro was used, but no Radiologist interpretation will be provided. Please refer to "NOTES" tab for provider progress note.    Assessment & Plan:   Hannah Maynard was seen today for back pain and leg pain.  Diagnoses and all orders for this visit:  DDD (degenerative disc disease), cervical  Chronic bilateral low back pain with bilateral sciatica -     Lumbar Epidural Injection  Lumbar spondylosis -     Lumbar Epidural Injection  DDD (degenerative disc disease), lumbar -     Lumbar Epidural Injection  Chronic pain syndrome  Spinal stenosis of lumbar region with neurogenic claudication  Facet arthritis of lumbar region  Cervicalgia  Other orders -     oxyCODONE (ROXICODONE) 5 MG immediate release tablet; Take 1 tablet (5 mg total) by mouth every 4 (four) hours as needed for severe pain. -     oxyCODONE (ROXICODONE) 5 MG immediate release tablet; Take 1 tablet (5 mg total) by mouth every 4 (four) hours as needed for severe pain. -      triamcinolone acetonide (KENALOG-40) injection 40 mg -     sodium chloride flush (NS) 0.9 % injection 10 mL -     ropivacaine (PF) 2 mg/mL (0.2%) (NAROPIN) injection 10 mL -     lidocaine (PF) (XYLOCAINE) 1 % injection 5 mL -     iohexol (OMNIPAQUE) 180 MG/ML injection 10 mL        ----------------------------------------------------------------------------------------------------------------------  Problem List Items Addressed This Visit       Unprioritized   Chronic low back pain (Chronic)   Relevant Medications   oxyCODONE (ROXICODONE) 5 MG immediate release tablet (Start on 04/19/2022)   oxyCODONE (ROXICODONE) 5 MG  immediate release tablet (Start on 05/18/2022)   Cervicalgia   Chronic pain syndrome   Relevant Medications   oxyCODONE (ROXICODONE) 5 MG immediate release tablet (Start on 04/19/2022)   oxyCODONE (ROXICODONE) 5 MG immediate release tablet (Start on 05/18/2022)   DDD (degenerative disc disease), cervical - Primary   Relevant Medications   oxyCODONE (ROXICODONE) 5 MG immediate release tablet (Start on 04/19/2022)   oxyCODONE (ROXICODONE) 5 MG immediate release tablet (Start on 05/18/2022)   Lumbar spondylosis   Relevant Medications   oxyCODONE (ROXICODONE) 5 MG immediate release tablet (Start on 04/19/2022)   oxyCODONE (ROXICODONE) 5 MG immediate release tablet (Start on 05/18/2022)   Other Visit Diagnoses     DDD (degenerative disc disease), lumbar       Relevant Medications   oxyCODONE (ROXICODONE) 5 MG immediate release tablet (Start on 04/19/2022)   oxyCODONE (ROXICODONE) 5 MG immediate release tablet (Start on 05/18/2022)   triamcinolone acetonide (KENALOG-40) injection 40 mg (Completed)   Spinal stenosis of lumbar region with neurogenic claudication       Relevant Medications   oxyCODONE (ROXICODONE) 5 MG immediate release tablet (Start on 04/19/2022)   oxyCODONE (ROXICODONE) 5 MG immediate release tablet (Start on 05/18/2022)   triamcinolone acetonide  (KENALOG-40) injection 40 mg (Completed)   ropivacaine (PF) 2 mg/mL (0.2%) (NAROPIN) injection 10 mL (Completed)   lidocaine (PF) (XYLOCAINE) 1 % injection 5 mL (Completed)   Facet arthritis of lumbar region       Relevant Medications   oxyCODONE (ROXICODONE) 5 MG immediate release tablet (Start on 04/19/2022)   oxyCODONE (ROXICODONE) 5 MG immediate release tablet (Start on 05/18/2022)   triamcinolone acetonide (KENALOG-40) injection 40 mg (Completed)         ----------------------------------------------------------------------------------------------------------------------  1. Chronic bilateral low back pain with bilateral sciatica We will proceed with a repeat epidural.  I gone over the risks and benefits of the procedure with him in full detail all questions were answered.  Will schedule her for return to clinic in 2 months.  No other changes are initiated I have encouraged her to continue efforts at weight loss with continued stretching strengthening exercises. - Lumbar Epidural Injection  2. Lumbar spondylosis As above.  She has been off of her anticoagulant for 10 days as reported today.  She is to restart this tomorrow morning. - Lumbar Epidural Injection  3. DDD (degenerative disc disease), lumbar As above - Lumbar Epidural Injection  4. DDD (degenerative disc disease), cervical   5. Chronic pain syndrome I have reviewed the [facility] practitioner database information is appropriate for refill of her medications today.  This to be dated for February 23 and March 24.  I have reviewed her most recent November 2023 urine drug screen which is appropriate as well.  Had a long discussion about chronic opioid management and the need to avoid any illicit drugs which she vehemently denies.  6. Spinal stenosis of lumbar region with neurogenic claudication As above with epidural today.  7. Facet arthritis of lumbar region   8.  Cervicalgia     ----------------------------------------------------------------------------------------------------------------------  I am having Hannah Maynard start on oxyCODONE. I am also having her maintain her ibuprofen, [place] TENS/EMS Combo Unit, ProAir HFA, montelukast, sertraline, Symbicort, atorvastatin, fluticasone, traZODone, triamcinolone cream, pregabalin, baclofen, FLUoxetine, pregabalin, and oxyCODONE. We administered triamcinolone acetonide, sodium chloride flush, ropivacaine (PF) 2 mg/mL (0.2%), lidocaine (PF), and iohexol. We will continue to administer ropivacaine (PF) 2 mg/mL (0.2%) and lidocaine (PF).   Meds ordered this encounter  Medications   oxyCODONE (ROXICODONE) 5 MG immediate release tablet    Sig: Take 1 tablet (5 mg total) by mouth every 4 (four) hours as needed for severe pain.    Dispense:  150 tablet    Refill:  0   oxyCODONE (ROXICODONE) 5 MG immediate release tablet    Sig: Take 1 tablet (5 mg total) by mouth every 4 (four) hours as needed for severe pain.    Dispense:  150 tablet    Refill:  0   triamcinolone acetonide (KENALOG-40) injection 40 mg   sodium chloride flush (NS) 0.9 % injection 10 mL   ropivacaine (PF) 2 mg/mL (0.2%) (NAROPIN) injection 10 mL   lidocaine (PF) (XYLOCAINE) 1 % injection 5 mL   iohexol (OMNIPAQUE) 180 MG/ML injection 10 mL   Patient's Medications  New Prescriptions   OXYCODONE (ROXICODONE) 5 MG IMMEDIATE RELEASE TABLET    Take 1 tablet (5 mg total) by mouth every 4 (four) hours as needed for severe pain.  Previous Medications   ATORVASTATIN (LIPITOR) 10 MG TABLET    Take 10 mg by mouth daily.   BACLOFEN (LIORESAL) 10 MG TABLET    Take 10 mg by mouth 2 (two) times daily.   FLUOXETINE (PROZAC) 40 MG CAPSULE    Take 40 mg by mouth daily.   FLUTICASONE (FLONASE) 50 MCG/ACT NASAL SPRAY    Place 1 spray into both nostrils daily.   IBUPROFEN (ADVIL,MOTRIN) 200 MG TABLET    Take 200 mg by mouth every 6 (six) hours as  needed.   MONTELUKAST (SINGULAIR) 10 MG TABLET    Take 10 mg by mouth at bedtime.   NERVE STIMULATOR (ZEWA TENS/EMS COMBO UNIT) DEVI    by Does not apply route.   PREGABALIN (LYRICA) 75 MG CAPSULE    Take 2 capsules (150 mg total) by mouth 2 (two) times daily.   PREGABALIN (LYRICA) 75 MG CAPSULE    Take 2 capsules (150 mg total) by mouth 2 (two) times daily.   PROAIR HFA 108 (90 BASE) MCG/ACT INHALER    Inhale 2 puffs into the lungs as needed.   SERTRALINE (ZOLOFT) 100 MG TABLET    Take 200 mg by mouth at bedtime.    SYMBICORT 80-4.5 MCG/ACT INHALER    Inhale 2 puffs into the lungs 2 (two) times daily.   TRAZODONE (DESYREL) 50 MG TABLET    Take 50 mg by mouth at bedtime.   TRIAMCINOLONE CREAM (KENALOG) 0.1 %    Apply 1 application topically 3 (three) times daily as needed.  Modified Medications   Modified Medication Previous Medication   OXYCODONE (ROXICODONE) 5 MG IMMEDIATE RELEASE TABLET oxyCODONE (ROXICODONE) 5 MG immediate release tablet      Take 1 tablet (5 mg total) by mouth every 4 (four) hours as needed for severe pain.    Take 1 tablet (5 mg total) by mouth every 4 (four) hours as needed for severe pain.  Discontinued Medications   No medications on file   ----------------------------------------------------------------------------------------------------------------------  Follow-up: Return in about 2 months (around 06/03/2022) for evaluation, med refill.   Procedure: L5-S1 LESI with fluoroscopic guidance and no moderate sedation  NOTE: The risks, benefits, and expectations of the procedure have been discussed and explained to the patient who was understanding and in agreement with suggested treatment plan. No guarantees were made.  DESCRIPTION OF PROCEDURE: Lumbar epidural steroid injection with no IV Versed, EKG, blood pressure, pulse, and pulse oximetry monitoring. The procedure was performed with the  patient in the prone position under fluoroscopic guidance.  Sterile prep x3 was  initiated and I then injected subcutaneous lidocaine to the overlying L5-S1 site after its fluoroscopic identifictation.  Using strict aseptic technique, I then advanced an 18-gauge Tuohy epidural needle in the midline using interlaminar approach via loss-of-resistance to saline technique. There was negative aspiration for heme or  CSF.  I then confirmed position with both AP and Lateral fluoroscan.  2 cc of contrast dye were injected and a  total of 5 mL of Preservative-Free normal saline mixed with 40 mg of Kenalog and 1cc Ropicaine 0.2 percent were injected incrementally via the  epidurally placed needle. The needle was removed. The patient tolerated the injection well and was convalesced and discharged to home in stable condition. Should the patient have any post procedure difficulty they have been instructed on how to contact us for assistance.   Molli Barrows, MD

## 2022-05-17 ENCOUNTER — Other Ambulatory Visit: Payer: Self-pay | Admitting: Anesthesiology

## 2022-05-18 ENCOUNTER — Emergency Department: Payer: Medicaid Other

## 2022-05-18 ENCOUNTER — Other Ambulatory Visit: Payer: Self-pay

## 2022-05-18 ENCOUNTER — Emergency Department
Admission: EM | Admit: 2022-05-18 | Discharge: 2022-05-18 | Disposition: A | Discharge status: Home / Self Care | Payer: Medicaid Other | Attending: Emergency Medicine | Admitting: Emergency Medicine

## 2022-05-18 DIAGNOSIS — F17210 Nicotine dependence, cigarettes, uncomplicated: Secondary | ICD-10-CM | POA: Insufficient documentation

## 2022-05-18 DIAGNOSIS — W19XXXA Unspecified fall, initial encounter: Secondary | ICD-10-CM

## 2022-05-18 DIAGNOSIS — G8929 Other chronic pain: Secondary | ICD-10-CM | POA: Diagnosis not present

## 2022-05-18 DIAGNOSIS — M5030 Other cervical disc degeneration, unspecified cervical region: Secondary | ICD-10-CM | POA: Diagnosis not present

## 2022-05-18 DIAGNOSIS — R209 Unspecified disturbances of skin sensation: Secondary | ICD-10-CM | POA: Insufficient documentation

## 2022-05-18 MED ORDER — KETOROLAC TROMETHAMINE 15 MG/ML IJ SOLN
15.0000 mg | Freq: Once | INTRAMUSCULAR | Status: AC
  Administered 2022-05-18: 15 mg via INTRAMUSCULAR
  Filled 2022-05-18: qty 1

## 2022-05-18 MED ORDER — HYDROMORPHONE HCL 1 MG/ML IJ SOLN
1.0000 mg | Freq: Once | INTRAMUSCULAR | Status: AC
  Administered 2022-05-18: 1 mg via SUBCUTANEOUS
  Filled 2022-05-18: qty 1

## 2022-05-18 MED ORDER — OXYCODONE HCL 5 MG PO TABS
5.0000 mg | ORAL_TABLET | Freq: Once | ORAL | Status: AC
  Administered 2022-05-18: 5 mg via ORAL
  Filled 2022-05-18: qty 1

## 2022-05-18 NOTE — ED Notes (Signed)
C-collar applied in triage.

## 2022-05-18 NOTE — Discharge Instructions (Signed)
You have been seen today in the emergency room after a fall.  Your CT scans were found to be without acute abnormality.  You were treated with several different pain medications with minimal relief.  I recommend that you follow-up with your pain management clinic for better control of your pain.  If your pain persist or worsens you should also follow-up with your primary care provider.

## 2022-05-18 NOTE — ED Triage Notes (Signed)
Pt was getting something out of the fridge and fell backwards hitting her head. Pt c/o back of neck pain and bilateral arm pain. Pt unable to tolerate BP cuff on either arm in triage.
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
## 2022-05-20 ENCOUNTER — Encounter: Payer: Self-pay | Admitting: Anesthesiology

## 2022-05-20 ENCOUNTER — Ambulatory Visit: Payer: Medicaid Other | Attending: Anesthesiology | Admitting: Anesthesiology

## 2022-05-20 ENCOUNTER — Telehealth: Payer: Self-pay | Admitting: Anesthesiology

## 2022-05-20 VITALS — BP 146/88 | HR 66 | Temp 97.4°F | Resp 20 | Ht 65.5 in | Wt 223.0 lb

## 2022-05-20 DIAGNOSIS — M79601 Pain in right arm: Secondary | ICD-10-CM | POA: Diagnosis not present

## 2022-05-20 DIAGNOSIS — M542 Cervicalgia: Secondary | ICD-10-CM | POA: Diagnosis present

## 2022-05-20 DIAGNOSIS — M5136 Other intervertebral disc degeneration, lumbar region: Secondary | ICD-10-CM | POA: Insufficient documentation

## 2022-05-20 DIAGNOSIS — M47816 Spondylosis without myelopathy or radiculopathy, lumbar region: Secondary | ICD-10-CM | POA: Diagnosis not present

## 2022-05-20 DIAGNOSIS — G894 Chronic pain syndrome: Secondary | ICD-10-CM | POA: Insufficient documentation

## 2022-05-20 DIAGNOSIS — M79602 Pain in left arm: Secondary | ICD-10-CM

## 2022-05-20 DIAGNOSIS — M5441 Lumbago with sciatica, right side: Secondary | ICD-10-CM | POA: Diagnosis not present

## 2022-05-20 DIAGNOSIS — G8929 Other chronic pain: Secondary | ICD-10-CM | POA: Diagnosis present

## 2022-05-20 DIAGNOSIS — W1830XA Fall on same level, unspecified, initial encounter: Secondary | ICD-10-CM

## 2022-05-20 DIAGNOSIS — M5030 Other cervical disc degeneration, unspecified cervical region: Secondary | ICD-10-CM | POA: Insufficient documentation

## 2022-05-20 DIAGNOSIS — M48062 Spinal stenosis, lumbar region with neurogenic claudication: Secondary | ICD-10-CM | POA: Diagnosis not present

## 2022-05-20 DIAGNOSIS — M5431 Sciatica, right side: Secondary | ICD-10-CM

## 2022-05-20 DIAGNOSIS — M5442 Lumbago with sciatica, left side: Secondary | ICD-10-CM | POA: Diagnosis not present

## 2022-05-20 DIAGNOSIS — Z79891 Long term (current) use of opiate analgesic: Secondary | ICD-10-CM | POA: Diagnosis not present

## 2022-05-20 MED ORDER — OXYCODONE HCL 5 MG PO TABS: 5.0000 mg | ORAL_TABLET | ORAL | 0 refills | Status: AC | PRN

## 2022-05-20 MED ORDER — ROPIVACAINE HCL 2 MG/ML IJ SOLN
INTRAMUSCULAR | Status: AC
  Filled 2022-05-20: qty 20

## 2022-05-20 MED ORDER — OXYCODONE HCL 5 MG PO TABS: 5.0000 mg | ORAL_TABLET | ORAL | 0 refills | Status: DC | PRN

## 2022-05-20 MED ORDER — DEXAMETHASONE SODIUM PHOSPHATE 10 MG/ML IJ SOLN
10.0000 mg | Freq: Once | INTRAMUSCULAR | Status: AC
  Administered 2022-05-20: 10 mg

## 2022-05-20 MED ORDER — ROPIVACAINE HCL 2 MG/ML IJ SOLN
10.0000 mL | Freq: Once | INTRAMUSCULAR | Status: AC
  Administered 2022-05-20: 9 mL via EPIDURAL

## 2022-05-20 MED ORDER — DEXAMETHASONE SODIUM PHOSPHATE 10 MG/ML IJ SOLN
INTRAMUSCULAR | Status: AC
  Filled 2022-05-20: qty 1

## 2022-05-20 NOTE — Progress Notes (Signed)
Safety precautions to be maintained throughout the outpatient stay will include: orient to surroundings, keep bed in low position, maintain call bell within reach at all times, provide assistance with transfer out of bed and ambulation.  

## 2022-05-20 NOTE — Progress Notes (Signed)
Subjective:  Patient ID: Hannah Maynard, female    DOB: 06/14/1965  Age: 57 y.o. MRN: [id number]  CC: Arm Pain (Bilateral ), Hip Pain (Right ), Leg Pain (Right ), and Other (Patient is experiencing severe nerve pain in the arms after a fall on this past Saturday )   Procedure: Bilateral trapezius trigger point injection  HPI Hannah Maynard presents for reevaluation.  She was last seen a month ago and had an epidural at that time.  She is seen today in clinic reporting that she experienced significant relief from her low back and sciatic symptoms following the epidural.  Unfortunately she fell backwards that yesterday while getting something out of the refrigerator and hit her head.  She was seen in the emergency room where CT was performed.  She presents today complaining of severe pain in the bilateral shoulder region and significant tenderness to touch.  She maintains that she has difficulty moving her upper arm secondary to the discomfort.  No change in mental status or upper extremity strength is reported.  In regards to her lower extremity status the back pain is still doing well and no change in strength is noted in the lower extremities.  Her sciatica symptoms have improved.  She still taking her medications as prescribed including the Lyrica.  Baclofen twice a day and her opioids as scheduled.  This combination therapy is helping her but she is having a considerable amount of pain in the posterior shoulder region following this recent fall.  Outpatient Medications Prior to Visit  Medication Sig Dispense Refill   atorvastatin (LIPITOR) 10 MG tablet Take 10 mg by mouth daily.     baclofen (LIORESAL) 10 MG tablet Take 10 mg by mouth 2 (two) times daily.     FLUoxetine (PROZAC) 40 MG capsule Take 40 mg by mouth daily.     Nerve Stimulator (ZEWA TENS/EMS COMBO UNIT) DEVI by Does not apply route.     oxyCODONE (ROXICODONE) 5 MG immediate release tablet Take 1 tablet (5 mg total) by mouth  every 4 (four) hours as needed for severe pain. 150 tablet 0   pregabalin (LYRICA) 75 MG capsule Take 2 capsules (150 mg total) by mouth 2 (two) times daily. 120 capsule 3   sertraline (ZOLOFT) 100 MG tablet Take 200 mg by mouth at bedtime.      traZODone (DESYREL) 50 MG tablet Take 50 mg by mouth at bedtime.     triamcinolone cream (KENALOG) 0.1 % Apply 1 application topically 3 (three) times daily as needed.     fluticasone (FLONASE) 50 MCG/ACT nasal spray Place 1 spray into both nostrils daily. (Patient not taking: Reported on 05/20/2022)     ibuprofen (ADVIL,MOTRIN) 200 MG tablet Take 200 mg by mouth every 6 (six) hours as needed. (Patient not taking: Reported on 05/20/2022)     montelukast (SINGULAIR) 10 MG tablet Take 10 mg by mouth at bedtime.     pregabalin (LYRICA) 75 MG capsule Take 2 capsules (150 mg total) by mouth 2 (two) times daily. 120 capsule 3   PROAIR HFA 108 (90 Base) MCG/ACT inhaler Inhale 2 puffs into the lungs as needed. (Patient not taking: Reported on 05/20/2022)     SYMBICORT 80-4.5 MCG/ACT inhaler Inhale 2 puffs into the lungs 2 (two) times daily. (Patient not taking: Reported on 05/20/2022)     Facility-Administered Medications Prior to Visit  Medication Dose Route Frequency Provider Last Rate Last Admin   lidocaine (PF) (XYLOCAINE) 1 % injection  5 mL  5 mL Subcutaneous Once Molli Barrows, MD       ropivacaine (PF) 2 mg/mL (0.2%) (NAROPIN) injection 10 mL  10 mL Epidural Once Molli Barrows, MD        Review of Systems CNS: No confusion or sedation Cardiac: No angina or palpitations GI: No abdominal pain or constipation Constitutional: No nausea vomiting fevers or chills  Objective:  BP (!) 146/88   Pulse 66   Temp (!) 97.4 F (36.3 C)   Resp 20   Ht 5' 5.5" (1.664 m)   Wt 223 lb (101.2 kg)   LMP  (LMP Unknown)   SpO2 98%   BMI 36.54 kg/m    BP Readings from Last 3 Encounters:  05/20/22 (!) 146/88  05/18/22 (!) 177/133  04/04/22 (!) 135/102     Wt  Readings from Last 3 Encounters:  05/20/22 223 lb (101.2 kg)  04/04/22 234 lb (106.1 kg)  11/26/21 224 lb (101.6 kg)     Physical Exam Pt is alert and oriented PERRL EOMI HEART IS RRR no murmur or rub LCTA no wheezing or rales MUSCULOSKELETAL reveals generalized tenderness to touch in the bilateral trapezius muscles.  She does have 2 trigger points on evaluation.  She has good range of motion at the atlantooccipital joint.  No restriction or pain with rotation flexion or extension.  Strength in the upper extremities is rated at 5/5 at the bicep tricep wrist with good hand grasp as well.  She has no evidence of any diminished sensation in the wrist or hands.  She does have hyperalgesia with light touch over the bilateral lateral upper arms.  Range of motion at the glenohumeral joint is intact.  Muscle tone and bulk to the lower extremities is intact as well  Labs  No results found for: "HGBA1C" Lab Results  Component Value Date   CREATININE 0.95 05/22/2021    -------------------------------------------------------------------------------------------------------------------- Lab Results  Component Value Date   WBC 5.3 05/22/2021   HGB 13.9 05/22/2021   HCT 43.0 05/22/2021   PLT 176 05/22/2021   GLUCOSE 132 (H) 05/22/2021   ALT 21 05/21/2021   AST 27 05/21/2021   NA 140 05/22/2021   K 4.1 05/22/2021   CL 109 05/22/2021   CREATININE 0.95 05/22/2021   BUN 16 05/22/2021   CO2 24 05/22/2021    --------------------------------------------------------------------------------------------------------------------- CT Cervical Spine Wo Contrast  Result Date: 05/18/2022 CLINICAL DATA:  Neck trauma, midline tenderness (Age 10-64y) EXAM: CT CERVICAL SPINE WITHOUT CONTRAST TECHNIQUE: Multidetector CT imaging of the cervical spine was performed without intravenous contrast. Multiplanar CT image reconstructions were also generated. RADIATION DOSE REDUCTION: This exam was performed according  to the departmental dose-optimization program which includes automated exposure control, adjustment of the mA and/or kV according to patient size and/or use of iterative reconstruction technique. COMPARISON:  06/30/2019 FINDINGS: Alignment: Normal. Skull base and vertebrae: No acute fracture. No primary bone lesion or focal pathologic process. Soft tissues and spinal canal: No prevertebral fluid or swelling. No visible canal hematoma. Disc levels: Disc space narrowing identified with marginal osteophyte formation consistent with degenerative disc disease C4-5 through C6-7. Upper chest: Negative. IMPRESSION: Degenerative changes.  No acute traumatic abnormalities. Electronically Signed   By: Sammie Bench M.D.   On: 05/18/2022 19:58   CT Head Wo Contrast  Result Date: 05/18/2022 CLINICAL DATA:  Head trauma EXAM: CT HEAD WITHOUT CONTRAST TECHNIQUE: Contiguous axial images were obtained from the base of the skull through the vertex without intravenous  contrast. RADIATION DOSE REDUCTION: This exam was performed according to the departmental dose-optimization program which includes automated exposure control, adjustment of the mA and/or kV according to patient size and/or use of iterative reconstruction technique. COMPARISON:  05/21/2021. FINDINGS: Brain: No evidence of acute infarction, hemorrhage, hydrocephalus, extra-axial collection or mass lesion/mass effect. Vascular: No hyperdense vessel or unexpected calcification. Skull: Normal. Negative for fracture or focal lesion. Sinuses/Orbits: No acute finding. IMPRESSION: No acute intracranial process. Electronically Signed   By: Sammie Bench M.D.   On: 05/18/2022 19:51     Assessment & Plan:   There are no diagnoses linked to this encounter.      ----------------------------------------------------------------------------------------------------------------------  Problem List Items Addressed This Visit    None     ----------------------------------------------------------------------------------------------------------------------  There are no diagnoses linked to this encounter.   ----------------------------------------------------------------------------------------------------------------------  I am having [place] maintain her ibuprofen, [place] TENS/EMS Combo Unit, ProAir HFA, montelukast, sertraline, Symbicort, atorvastatin, fluticasone, traZODone, triamcinolone cream, pregabalin, baclofen, FLUoxetine, pregabalin, and oxyCODONE. We administered dexamethasone and ropivacaine (PF) 2 mg/mL (0.2%). We will continue to administer ropivacaine (PF) 2 mg/mL (0.2%) and lidocaine (PF).   Meds ordered this encounter  Medications   dexamethasone (DECADRON) injection 10 mg   ropivacaine (PF) 2 mg/mL (0.2%) (NAROPIN) injection 10 mL   Patient's Medications  New Prescriptions   No medications on file  Previous Medications   ATORVASTATIN (LIPITOR) 10 MG TABLET    Take 10 mg by mouth daily.   BACLOFEN (LIORESAL) 10 MG TABLET    Take 10 mg by mouth 2 (two) times daily.   FLUOXETINE (PROZAC) 40 MG CAPSULE    Take 40 mg by mouth daily.   FLUTICASONE (FLONASE) 50 MCG/ACT NASAL SPRAY    Place 1 spray into both nostrils daily.   IBUPROFEN (ADVIL,MOTRIN) 200 MG TABLET    Take 200 mg by mouth every 6 (six) hours as needed.   MONTELUKAST (SINGULAIR) 10 MG TABLET    Take 10 mg by mouth at bedtime.   NERVE STIMULATOR (ZEWA TENS/EMS COMBO UNIT) DEVI    by Does not apply route.   OXYCODONE (ROXICODONE) 5 MG IMMEDIATE RELEASE TABLET    Take 1 tablet (5 mg total) by mouth every 4 (four) hours as needed for severe pain.   PREGABALIN (LYRICA) 75 MG CAPSULE    Take 2 capsules (150 mg total) by mouth 2 (two) times daily.   PREGABALIN (LYRICA) 75 MG CAPSULE    Take 2 capsules (150 mg total) by mouth 2 (two) times daily.   PROAIR HFA 108 (90 BASE) MCG/ACT INHALER    Inhale 2 puffs into the lungs as  needed.   SERTRALINE (ZOLOFT) 100 MG TABLET    Take 200 mg by mouth at bedtime.    SYMBICORT 80-4.5 MCG/ACT INHALER    Inhale 2 puffs into the lungs 2 (two) times daily.   TRAZODONE (DESYREL) 50 MG TABLET    Take 50 mg by mouth at bedtime.   TRIAMCINOLONE CREAM (KENALOG) 0.1 %    Apply 1 application topically 3 (three) times daily as needed.  Modified Medications   No medications on file  Discontinued Medications   No medications on file   ----------------------------------------------------------------------------------------------------------------------  Follow-up: Return in about 2 months (around 07/20/2022) for evaluation, med refill.  1. Chronic bilateral low back pain with bilateral sciatica   2. Lumbar spondylosis   3. DDD (degenerative disc disease), lumbar   4. DDD (degenerative disc disease), cervical   5. Chronic pain syndrome  6. Spinal stenosis of lumbar region with neurogenic claudication   7. Facet arthritis of lumbar region   8. Cervicalgia   9. Right sided sciatica   10. Chronic neck pain   11. Long term current use of opiate analgesic   In regards to her low back pain I am pleased with her response to therapy.  She had about a 75% reduction in low back pain and significant reduction at 80 to 90% improvement in her sciatica symptoms.  She does have spinal stenosis and still does have difficulty with extensive walking likely secondary to deconditioning.  I am uncertain as to why she fell the other day but she did have an emergency room evaluation and I have discussed the need to follow-up with primary care for any further acute management and possible neurologic evaluation should she experience any change in upper extremity strength or function.  At present she does have 2 trigger points in the bilateral trapezius with a history of chronic cervicalgia I think it would be beneficial to inject these to see if she can get some pain relief and the IM steroid certainly could be of  benefit for her discomfort.  We gone over the risks and benefits of that in detail and she is in agreement with that plan.  Will keep her on her current medications and continue taking baclofen twice a day in addition to the Lyrica and her opioid management.  Will schedule her for a 44-month return to clinic.  Do want her to follow-up with her primary care physician and as discussed, neurologic evaluation should she have any change in upper extremities function.  Trigger point injection: The area overlying the aforementioned trigger points were prepped with alcohol. They were then injected with a 25-gauge needle with 4 cc of ropivacaine 0.2% and Decadron 4 mg at each site after negative aspiration for heme. This was performed after informed consent was obtained and risks and benefits reviewed. She tolerated this procedure without difficulty and was convalesced and discharged to home in stable condition for follow-up as mentioned.  @Idora Brosious  [place], MD@  Molli Barrows, MD

## 2022-05-20 NOTE — Telephone Encounter (Signed)
Patient says she fell this weekend and is unable to use her arms, in severe pain. Went to ED but was told there was nothing they could do for her and she needs to see Dr Andree Elk. Wants someone to call her.

## 2022-05-20 NOTE — Telephone Encounter (Signed)
Does Dr. Andree Elk' schedule allow her to come in today?

## 2022-05-22 ENCOUNTER — Other Ambulatory Visit: Payer: Self-pay | Admitting: Anesthesiology

## 2022-05-23 ENCOUNTER — Telehealth: Payer: Self-pay | Admitting: Anesthesiology

## 2022-05-23 NOTE — Telephone Encounter (Signed)
PT stated that she can't be seen by neurology until May , PT wants to see if she can get appt for some injections. Please give patient a call. TY

## 2022-06-04 ENCOUNTER — Encounter: Payer: Medicaid Other | Admitting: Anesthesiology

## 2022-06-04 ENCOUNTER — Ambulatory Visit: Payer: Medicaid Other | Attending: Anesthesiology | Admitting: Anesthesiology

## 2022-06-04 DIAGNOSIS — M542 Cervicalgia: Secondary | ICD-10-CM

## 2022-06-04 DIAGNOSIS — M47816 Spondylosis without myelopathy or radiculopathy, lumbar region: Secondary | ICD-10-CM

## 2022-06-04 DIAGNOSIS — Z79891 Long term (current) use of opiate analgesic: Secondary | ICD-10-CM

## 2022-06-04 DIAGNOSIS — G894 Chronic pain syndrome: Secondary | ICD-10-CM

## 2022-06-04 DIAGNOSIS — M48062 Spinal stenosis, lumbar region with neurogenic claudication: Secondary | ICD-10-CM

## 2022-06-04 DIAGNOSIS — G8929 Other chronic pain: Secondary | ICD-10-CM

## 2022-06-04 DIAGNOSIS — R29898 Other symptoms and signs involving the musculoskeletal system: Secondary | ICD-10-CM

## 2022-06-04 DIAGNOSIS — S4491XD Injury of unspecified nerve at shoulder and upper arm level, right arm, subsequent encounter: Secondary | ICD-10-CM

## 2022-06-04 DIAGNOSIS — M5030 Other cervical disc degeneration, unspecified cervical region: Secondary | ICD-10-CM

## 2022-06-04 DIAGNOSIS — M5136 Other intervertebral disc degeneration, lumbar region: Secondary | ICD-10-CM

## 2022-06-04 DIAGNOSIS — S4492XD Injury of unspecified nerve at shoulder and upper arm level, left arm, subsequent encounter: Secondary | ICD-10-CM | POA: Insufficient documentation
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
## 2022-06-05 ENCOUNTER — Telehealth: Payer: Medicaid Other | Admitting: Anesthesiology

## 2022-07-08 ENCOUNTER — Ambulatory Visit (HOSPITAL_BASED_OUTPATIENT_CLINIC_OR_DEPARTMENT_OTHER): Payer: Medicaid Other | Admitting: Anesthesiology

## 2022-07-08 ENCOUNTER — Ambulatory Visit
Admission: RE | Admit: 2022-07-08 | Discharge: 2022-07-08 | Disposition: A | Discharge status: Home / Self Care | Payer: Medicaid Other | Source: Ambulatory Visit | Attending: Anesthesiology | Admitting: Anesthesiology

## 2022-07-08 ENCOUNTER — Other Ambulatory Visit: Payer: Self-pay | Admitting: Anesthesiology

## 2022-07-08 ENCOUNTER — Encounter: Payer: Self-pay | Admitting: Anesthesiology

## 2022-07-08 VITALS — BP 130/78 | HR 70 | Temp 97.0°F | Resp 13 | Ht 65.0 in | Wt 238.0 lb

## 2022-07-08 DIAGNOSIS — S4491XD Injury of unspecified nerve at shoulder and upper arm level, right arm, subsequent encounter: Secondary | ICD-10-CM | POA: Insufficient documentation

## 2022-07-08 DIAGNOSIS — W19XXXA Unspecified fall, initial encounter: Secondary | ICD-10-CM | POA: Insufficient documentation

## 2022-07-08 DIAGNOSIS — R29898 Other symptoms and signs involving the musculoskeletal system: Secondary | ICD-10-CM

## 2022-07-08 DIAGNOSIS — G894 Chronic pain syndrome: Secondary | ICD-10-CM

## 2022-07-08 DIAGNOSIS — G8929 Other chronic pain: Secondary | ICD-10-CM | POA: Diagnosis present

## 2022-07-08 DIAGNOSIS — M5136 Other intervertebral disc degeneration, lumbar region: Secondary | ICD-10-CM

## 2022-07-08 DIAGNOSIS — S448X1A Injury of other nerves at shoulder and upper arm level, right arm, initial encounter: Secondary | ICD-10-CM | POA: Insufficient documentation

## 2022-07-08 DIAGNOSIS — M542 Cervicalgia: Secondary | ICD-10-CM | POA: Insufficient documentation

## 2022-07-08 DIAGNOSIS — M5441 Lumbago with sciatica, right side: Secondary | ICD-10-CM | POA: Diagnosis not present

## 2022-07-08 DIAGNOSIS — M47816 Spondylosis without myelopathy or radiculopathy, lumbar region: Secondary | ICD-10-CM | POA: Insufficient documentation

## 2022-07-08 DIAGNOSIS — M7918 Myalgia, other site: Secondary | ICD-10-CM | POA: Insufficient documentation

## 2022-07-08 DIAGNOSIS — M5030 Other cervical disc degeneration, unspecified cervical region: Secondary | ICD-10-CM

## 2022-07-08 DIAGNOSIS — M5431 Sciatica, right side: Secondary | ICD-10-CM | POA: Insufficient documentation

## 2022-07-08 DIAGNOSIS — M48062 Spinal stenosis, lumbar region with neurogenic claudication: Secondary | ICD-10-CM | POA: Diagnosis not present

## 2022-07-08 DIAGNOSIS — M25511 Pain in right shoulder: Secondary | ICD-10-CM | POA: Diagnosis not present

## 2022-07-08 DIAGNOSIS — M5442 Lumbago with sciatica, left side: Secondary | ICD-10-CM | POA: Insufficient documentation

## 2022-07-08 MED ORDER — ROPIVACAINE HCL 2 MG/ML IJ SOLN
10.0000 mL | Freq: Once | INTRAMUSCULAR | Status: AC
  Administered 2022-07-08: 1 mL via EPIDURAL

## 2022-07-08 MED ORDER — IOHEXOL 180 MG/ML  SOLN
10.0000 mL | Freq: Once | INTRAMUSCULAR | Status: AC | PRN
  Administered 2022-07-08: 10 mL via EPIDURAL

## 2022-07-08 MED ORDER — LIDOCAINE HCL (PF) 1 % IJ SOLN
5.0000 mL | Freq: Once | INTRAMUSCULAR | Status: AC
  Administered 2022-07-08: 5 mL via SUBCUTANEOUS

## 2022-07-08 MED ORDER — IOHEXOL 180 MG/ML  SOLN
INTRAMUSCULAR | Status: AC
  Filled 2022-07-08: qty 20

## 2022-07-08 MED ORDER — SODIUM CHLORIDE (PF) 0.9 % IJ SOLN
INTRAMUSCULAR | Status: AC
  Filled 2022-07-08: qty 10

## 2022-07-08 MED ORDER — OXYCODONE HCL 5 MG PO TABS: 5.0000 mg | ORAL_TABLET | ORAL | 0 refills | Status: AC | PRN

## 2022-07-08 MED ORDER — ROPIVACAINE HCL 2 MG/ML IJ SOLN
INTRAMUSCULAR | Status: AC
  Filled 2022-07-08: qty 20

## 2022-07-08 MED ORDER — LIDOCAINE HCL (PF) 1 % IJ SOLN
INTRAMUSCULAR | Status: AC
  Filled 2022-07-08: qty 5

## 2022-07-08 MED ORDER — SODIUM CHLORIDE 0.9% FLUSH
10.0000 mL | Freq: Once | INTRAVENOUS | Status: AC
  Administered 2022-07-08: 10 mL

## 2022-07-08 MED ORDER — TRIAMCINOLONE ACETONIDE 40 MG/ML IJ SUSP
40.0000 mg | Freq: Once | INTRAMUSCULAR | Status: AC
  Administered 2022-07-08: 40 mg

## 2022-07-08 MED ORDER — DEXAMETHASONE SODIUM PHOSPHATE 10 MG/ML IJ SOLN
10.0000 mg | Freq: Once | INTRAMUSCULAR | Status: AC
  Administered 2022-07-08: 10 mg
  Filled 2022-07-08: qty 1

## 2022-07-08 MED ORDER — TRIAMCINOLONE ACETONIDE 40 MG/ML IJ SUSP
INTRAMUSCULAR | Status: AC
  Filled 2022-07-08: qty 1

## 2022-07-08 NOTE — Progress Notes (Signed)
Subjective:  Patient ID: Hannah Maynard, female    DOB: [birth year]  Age: 57 y.o. MRN: [id number]  CC: Back Pain (lower)   Procedure:  L5-S1 lumbar epidural steroid under fluoroscopic guidance and no sedation 2.  Bilateral mid body trapezius trigger point injection  HPI [name] presents for reevaluation.  [name] continues to have persistent low back pain with intermittent sciatica flareups.  She receives occasional epidural steroid injections to help with the sciatica as it remains quite recalcitrant to conservative therapy.  She has tried physical therapy modalities at home stretching strengthening core strengthening and conservative medication management with inability to gain significant relief.  She gets epidurals and has nearly complete relief of her sciatica for about 2 months following epidural.  The low back pain responds to a lesser extent rated at 50 to 70% over the course of 2 months with recurrence thereafter.  She continues to take her chronic opioid therapy to help with pain control.  She also is still recovering from some cervical irritation involving the trapezius musculature with radiation into the upper arms.  She is no longer experiencing any significant upper extremity numbness or tingling and her motor strength to the upper extremities is better she reports.  She continues to do her core stretching strengthening exercises for both her neck and back. Outpatient Medications Prior to Visit  Medication Sig Dispense Refill   atorvastatin (LIPITOR) 10 MG tablet Take 10 mg by mouth daily.     baclofen (LIORESAL) 10 MG tablet Take 10 mg by mouth 2 (two) times daily.     FLUoxetine (PROZAC) 40 MG capsule Take 40 mg by mouth daily.     Nerve Stimulator (ZEWA TENS/EMS COMBO UNIT) DEVI by Does not apply route.     oxyCODONE (ROXICODONE) 5 MG immediate release tablet Take 1 tablet (5 mg total) by mouth every 4 (four) hours as needed for severe pain. 150 tablet 0   [START ON  07/17/2022] oxyCODONE (ROXICODONE) 5 MG immediate release tablet Take 1 tablet (5 mg total) by mouth every 4 (four) hours as needed for severe pain. 150 tablet 0   sertraline (ZOLOFT) 100 MG tablet Take 200 mg by mouth at bedtime.      triamcinolone cream (KENALOG) 0.1 % Apply 1 application topically 3 (three) times daily as needed.     fluticasone (FLONASE) 50 MCG/ACT nasal spray Place 1 spray into both nostrils daily. (Patient not taking: Reported on 05/20/2022)     ibuprofen (ADVIL,MOTRIN) 200 MG tablet Take 200 mg by mouth every 6 (six) hours as needed. (Patient not taking: Reported on 05/20/2022)     montelukast (SINGULAIR) 10 MG tablet Take 10 mg by mouth at bedtime.     pregabalin (LYRICA) 75 MG capsule Take 2 capsules (150 mg total) by mouth 2 (two) times daily. 120 capsule 3   pregabalin (LYRICA) 75 MG capsule Take 2 capsules (150 mg total) by mouth 2 (two) times daily. 120 capsule 3   PROAIR HFA 108 (90 Base) MCG/ACT inhaler Inhale 2 puffs into the lungs as needed. (Patient not taking: Reported on 07/08/2022)     SYMBICORT 80-4.5 MCG/ACT inhaler Inhale 2 puffs into the lungs 2 (two) times daily. (Patient not taking: Reported on 07/08/2022)     traZODone (DESYREL) 50 MG tablet Take 50 mg by mouth at bedtime.     Facility-Administered Medications Prior to Visit  Medication Dose Route Frequency Provider Last Rate Last Admin   lidocaine (PF) (XYLOCAINE) 1 %  injection 5 mL  5 mL Subcutaneous Once Yevette Edwards, MD       ropivacaine (PF) 2 mg/mL (0.2%) (NAROPIN) injection 10 mL  10 mL Epidural Once Yevette Edwards, MD        Review of Systems CNS: No confusion or sedation Cardiac: No angina or palpitations GI: No abdominal pain or constipation Constitutional: No nausea vomiting fevers or chills  Objective:  BP 130/78   Pulse 70   Temp (!) 97 F (36.1 C) (Temporal)   Resp 13   Ht 5\' 5"  (1.651 m)   Wt 238 lb (108 kg)   LMP  (LMP Unknown)   SpO2 99%   BMI 39.61 kg/m    BP Readings  from Last 3 Encounters:  07/08/22 130/78  05/20/22 (!) 146/88  05/18/22 (!) 177/133     Wt Readings from Last 3 Encounters:  07/08/22 238 lb (108 kg)  05/20/22 223 lb (101.2 kg)  04/04/22 234 lb (106.1 kg)     Physical Exam Pt is alert and oriented PERRL EOMI HEART IS RRR no murmur or rub LCTA no wheezing or rales MUSCULOSKELETAL reveals 2 trigger points in the right posterior mid body trapezius.  These are reproducible with palpation and radiation in the posterior shoulder region.  Good range of motion at the atlantooccipital joint.  Good hand grasp bilaterally good muscle tone and bulk.  Additionally positive straight leg raise on the right side negative on the left with good lower extremity range of motion and muscle tone and bulk.  Labs  No results found for: "HGBA1C" Lab Results  Component Value Date   CREATININE 0.95 05/22/2021    -------------------------------------------------------------------------------------------------------------------- Lab Results  Component Value Date   WBC 5.3 05/22/2021   HGB 13.9 05/22/2021   HCT 43.0 05/22/2021   PLT 176 05/22/2021   GLUCOSE 132 (H) 05/22/2021   ALT 21 05/21/2021   AST 27 05/21/2021   NA 140 05/22/2021   K 4.1 05/22/2021   CL 109 05/22/2021   CREATININE 0.95 05/22/2021   BUN 16 05/22/2021   CO2 24 05/22/2021    --------------------------------------------------------------------------------------------------------------------- DG PAIN CLINIC C-ARM 1-60 MIN NO REPORT  Result Date: 07/08/2022 Fluoro was used, but no Radiologist interpretation will be provided. Please refer to "NOTES" tab for provider progress note.    Assessment & Plan:   [name] was seen today for back pain.  Diagnoses and all orders for this visit:  DDD (degenerative disc disease), cervical -     dexamethasone (DECADRON) injection 10 mg  Chronic bilateral low back pain with bilateral sciatica -     Lumbar Epidural Injection  Right  sided sciatica -     Lumbar Epidural Injection -     triamcinolone acetonide (KENALOG-40) injection 40 mg -     sodium chloride flush (NS) 0.9 % injection 10 mL -     ropivacaine (PF) 2 mg/mL (0.2%) (NAROPIN) injection 10 mL -     lidocaine (PF) (XYLOCAINE) 1 % injection 5 mL -     iohexol (OMNIPAQUE) 180 MG/ML injection 10 mL  Lumbar spondylosis -     Lumbar Epidural Injection  DDD (degenerative disc disease), lumbar -     Lumbar Epidural Injection  Spinal stenosis of lumbar region with neurogenic claudication  Chronic neck pain -     dexamethasone (DECADRON) injection 10 mg  Cervicalgia -     dexamethasone (DECADRON) injection 10 mg  Facet arthritis of lumbar region  Chronic pain syndrome  Weakness of  both lower extremities  Neuropraxia of upper extremity, right, subsequent encounter        ----------------------------------------------------------------------------------------------------------------------  Problem List Items Addressed This Visit       Unprioritized   Chronic low back pain (Chronic)   Cervicalgia   Chronic neck pain   Chronic pain syndrome   DDD (degenerative disc disease), cervical - Primary   Lumbar spondylosis   Other Visit Diagnoses     Right sided sciatica       Relevant Medications   triamcinolone acetonide (KENALOG-40) injection 40 mg (Completed)   sodium chloride flush (NS) 0.9 % injection 10 mL (Completed)   ropivacaine (PF) 2 mg/mL (0.2%) (NAROPIN) injection 10 mL (Completed)   lidocaine (PF) (XYLOCAINE) 1 % injection 5 mL (Completed)   iohexol (OMNIPAQUE) 180 MG/ML injection 10 mL (Completed)   DDD (degenerative disc disease), lumbar       Relevant Medications   triamcinolone acetonide (KENALOG-40) injection 40 mg (Completed)   dexamethasone (DECADRON) injection 10 mg (Completed)   Spinal stenosis of lumbar region with neurogenic claudication       Relevant Medications   triamcinolone acetonide (KENALOG-40) injection 40 mg  (Completed)   ropivacaine (PF) 2 mg/mL (0.2%) (NAROPIN) injection 10 mL (Completed)   lidocaine (PF) (XYLOCAINE) 1 % injection 5 mL (Completed)   dexamethasone (DECADRON) injection 10 mg (Completed)   Facet arthritis of lumbar region       Relevant Medications   triamcinolone acetonide (KENALOG-40) injection 40 mg (Completed)   dexamethasone (DECADRON) injection 10 mg (Completed)   Weakness of both lower extremities       Neuropraxia of upper extremity, right, subsequent encounter             ----------------------------------------------------------------------------------------------------------------------  1. Chronic bilateral low back pain with bilateral sciatica We will proceed with a repeat lumbar epidural today.  Gone over the risks benefits of the procedure with her in full detail all questions answered.  I have encouraged her to continue with core stretching strengthening and efforts at weight loss.  Continue taking current medication management with return to clinic scheduled in 1 month. - Lumbar Epidural Injection  2. Right sided sciatica As above - Lumbar Epidural Injection - triamcinolone acetonide (KENALOG-40) injection 40 mg - sodium chloride flush (NS) 0.9 % injection 10 mL - ropivacaine (PF) 2 mg/mL (0.2%) (NAROPIN) injection 10 mL - lidocaine (PF) (XYLOCAINE) 1 % injection 5 mL - iohexol (OMNIPAQUE) 180 MG/ML injection 10 mL  3. Lumbar spondylosis As above - Lumbar Epidural Injection  4. DDD (degenerative disc disease), lumbar  - Lumbar Epidural Injection  5. DDD (degenerative disc disease), cervical Continue stretching exercises as reviewed today.  She appears to be making good progress with her neuropraxia symptoms.  She is doing her exercises which seem to be helping.  We will proceed with trigger point injections to the after mentioned trapezius musculature as these did help significantly as reported following previous injection. - dexamethasone  (DECADRON) injection 10 mg  6. Spinal stenosis of lumbar region with neurogenic claudication   7. Chronic neck pain As above - dexamethasone (DECADRON) injection 10 mg  8. Cervicalgia As above - dexamethasone (DECADRON) injection 10 mg  9. Facet arthritis of lumbar region Continue core strengthening  10. Chronic pain syndrome I have reviewed the [facility] practitioner database information and it is appropriate for refills.  She continues to get good relief with her chronic opioid management and derives good functional benefit.  She maintains that she is unable to  function without her current medication management regimen.  11. Weakness of both lower extremities This remains intermittent and reportedly improves following epidural steroid administration  12. Neuropraxia of upper extremity, right, subsequent encounter     ----------------------------------------------------------------------------------------------------------------------  I am having Kiely R. Borland maintain her ibuprofen, [place] TENS/EMS Combo Unit, ProAir HFA, montelukast, sertraline, Symbicort, atorvastatin, fluticasone, traZODone, triamcinolone cream, pregabalin, baclofen, FLUoxetine, pregabalin, oxyCODONE, and oxyCODONE. We administered triamcinolone acetonide, sodium chloride flush, ropivacaine (PF) 2 mg/mL (0.2%), lidocaine (PF), dexamethasone, and iohexol. We will continue to administer ropivacaine (PF) 2 mg/mL (0.2%) and lidocaine (PF).   Meds ordered this encounter  Medications   triamcinolone acetonide (KENALOG-40) injection 40 mg   sodium chloride flush (NS) 0.9 % injection 10 mL   ropivacaine (PF) 2 mg/mL (0.2%) (NAROPIN) injection 10 mL   lidocaine (PF) (XYLOCAINE) 1 % injection 5 mL   dexamethasone (DECADRON) injection 10 mg   iohexol (OMNIPAQUE) 180 MG/ML injection 10 mL   Patient's Medications  New Prescriptions   No medications on file  Previous Medications   ATORVASTATIN (LIPITOR) 10  MG TABLET    Take 10 mg by mouth daily.   BACLOFEN (LIORESAL) 10 MG TABLET    Take 10 mg by mouth 2 (two) times daily.   FLUOXETINE (PROZAC) 40 MG CAPSULE    Take 40 mg by mouth daily.   FLUTICASONE (FLONASE) 50 MCG/ACT NASAL SPRAY    Place 1 spray into both nostrils daily.   IBUPROFEN (ADVIL,MOTRIN) 200 MG TABLET    Take 200 mg by mouth every 6 (six) hours as needed.   MONTELUKAST (SINGULAIR) 10 MG TABLET    Take 10 mg by mouth at bedtime.   NERVE STIMULATOR (ZEWA TENS/EMS COMBO UNIT) DEVI    by Does not apply route.   OXYCODONE (ROXICODONE) 5 MG IMMEDIATE RELEASE TABLET    Take 1 tablet (5 mg total) by mouth every 4 (four) hours as needed for severe pain.   OXYCODONE (ROXICODONE) 5 MG IMMEDIATE RELEASE TABLET    Take 1 tablet (5 mg total) by mouth every 4 (four) hours as needed for severe pain.   PREGABALIN (LYRICA) 75 MG CAPSULE    Take 2 capsules (150 mg total) by mouth 2 (two) times daily.   PREGABALIN (LYRICA) 75 MG CAPSULE    Take 2 capsules (150 mg total) by mouth 2 (two) times daily.   PROAIR HFA 108 (90 BASE) MCG/ACT INHALER    Inhale 2 puffs into the lungs as needed.   SERTRALINE (ZOLOFT) 100 MG TABLET    Take 200 mg by mouth at bedtime.    SYMBICORT 80-4.5 MCG/ACT INHALER    Inhale 2 puffs into the lungs 2 (two) times daily.   TRAZODONE (DESYREL) 50 MG TABLET    Take 50 mg by mouth at bedtime.   TRIAMCINOLONE CREAM (KENALOG) 0.1 %    Apply 1 application topically 3 (three) times daily as needed.  Modified Medications   No medications on file  Discontinued Medications   No medications on file   ----------------------------------------------------------------------------------------------------------------------  Follow-up: Return in about 4 weeks (around 08/05/2022), or PPE, VVE.   Procedure: L5-S1 LESI with fluoroscopic guidance and without moderate sedation  NOTE: The risks, benefits, and expectations of the procedure have been discussed and explained to the patient who was  understanding and in agreement with suggested treatment plan. No guarantees were made.  DESCRIPTION OF PROCEDURE: Lumbar epidural steroid injection with no IV Versed, EKG, blood pressure, pulse, and pulse oximetry monitoring. The procedure  was performed with the patient in the prone position under fluoroscopic guidance.  Sterile prep x3 was initiated and I then injected subcutaneous lidocaine to the overlying L5-S1 site after its fluoroscopic identifictation.  Using strict aseptic technique, I then advanced an 18-gauge Tuohy epidural needle in the midline using interlaminar approach via loss-of-resistance to saline technique. There was negative aspiration for heme or  CSF.  I then confirmed position with both AP and Lateral fluoroscan.  2 cc of contrast dye were injected and a  total of 5 mL of Preservative-Free normal saline mixed with 40 mg of Kenalog and 1cc Ropicaine 0.2 percent were injected incrementally via the  epidurally placed needle. The needle was removed. The patient tolerated the injection well and was convalesced and discharged to home in stable condition. Should the patient have any post procedure difficulty they have been instructed on how to contact us for assistance.   Trigger point injection: The area overlying the aforementioned trigger points were prepped with alcohol. They were then injected with a 25-gauge needle with 3 cc of ropivacaine 0.2% and Decadron 3mg  at each site after negative aspiration for heme. This was performed after informed consent was obtained and risks and benefits reviewed. She tolerated this procedure without difficulty and was convalesced and discharged to home in stable condition for follow-up as mentioned.  @Daran Favaro, MD@ Yevette Edwards, MD

## 2022-07-08 NOTE — Patient Instructions (Signed)

## 2022-07-09 ENCOUNTER — Telehealth: Payer: Self-pay

## 2022-07-09 ENCOUNTER — Telehealth: Payer: Self-pay | Admitting: Anesthesiology

## 2022-07-09 NOTE — Telephone Encounter (Signed)
Post procedure follow up.  Patient states she is doing well.   ?

## 2022-07-09 NOTE — Telephone Encounter (Signed)
Left message for CJ tocall office for information needed.

## 2022-07-09 NOTE — Telephone Encounter (Signed)
Patient called stating, [employer] called her and they need Dr [name] Name Printed along with his NPI # so they can complete the order for her tens unit. She did not give me any numbers.

## 2022-07-17 ENCOUNTER — Other Ambulatory Visit: Payer: Self-pay | Admitting: Anesthesiology

## 2022-07-18 ENCOUNTER — Telehealth: Payer: Self-pay

## 2022-07-18 ENCOUNTER — Other Ambulatory Visit: Payer: Self-pay

## 2022-07-18 NOTE — Telephone Encounter (Signed)
Sent this information to the company. Returned Ms Manson Passey call and informed her of this

## 2022-07-18 NOTE — Telephone Encounter (Signed)
When I talked with the patient she stated that she had already received the TENS unit and that she loved it. Informed her that I sent information needed this am

## 2022-07-18 NOTE — Telephone Encounter (Signed)
I had a phone call from [facility], the patients insurance. They said we ordered a Tens unit for the patient, and they need a body map in order to approve this. It is the map of the body to show the pain areas. I looked in epic and cannot find where Dr. [name] ordered a tens unit. Can someone ask him about this? I dont want to get involved if he didn't even order it. If he did, I will need him to fill out the body map.

## 2022-09-16 ENCOUNTER — Ambulatory Visit: Payer: Medicaid Other | Attending: Anesthesiology | Admitting: Anesthesiology

## 2022-09-16 ENCOUNTER — Encounter: Payer: Self-pay | Admitting: Anesthesiology

## 2022-09-16 DIAGNOSIS — M5136 Other intervertebral disc degeneration, lumbar region: Secondary | ICD-10-CM | POA: Diagnosis not present

## 2022-09-16 DIAGNOSIS — M5030 Other cervical disc degeneration, unspecified cervical region: Secondary | ICD-10-CM

## 2022-09-16 DIAGNOSIS — M47816 Spondylosis without myelopathy or radiculopathy, lumbar region: Secondary | ICD-10-CM

## 2022-09-16 DIAGNOSIS — G894 Chronic pain syndrome: Secondary | ICD-10-CM

## 2022-09-16 DIAGNOSIS — G8929 Other chronic pain: Secondary | ICD-10-CM

## 2022-09-16 DIAGNOSIS — M51369 Other intervertebral disc degeneration, lumbar region without mention of lumbar back pain or lower extremity pain: Secondary | ICD-10-CM

## 2022-09-16 DIAGNOSIS — M5442 Lumbago with sciatica, left side: Secondary | ICD-10-CM | POA: Diagnosis not present

## 2022-09-16 DIAGNOSIS — M5441 Lumbago with sciatica, right side: Secondary | ICD-10-CM

## 2022-09-16 DIAGNOSIS — M48062 Spinal stenosis, lumbar region with neurogenic claudication: Secondary | ICD-10-CM

## 2022-09-16 DIAGNOSIS — M542 Cervicalgia: Secondary | ICD-10-CM

## 2022-09-16 MED ORDER — OXYCODONE HCL 10 MG PO TABS: 10.0000 mg | ORAL_TABLET | Freq: Four times a day (QID) | ORAL | 0 refills | Status: DC

## 2022-09-16 MED ORDER — OXYCODONE HCL 10 MG PO TABS: 10.0000 mg | ORAL_TABLET | Freq: Four times a day (QID) | ORAL | 0 refills | Status: AC
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
## 2022-09-16 NOTE — Patient Instructions (Signed)

## 2022-09-17 ENCOUNTER — Telehealth: Payer: Self-pay

## 2022-09-17 NOTE — Telephone Encounter (Signed)
Called patient to obtain pain score, I could not find it documented. She reports her pain score at this time is an 8 out of 10.Marland Kitchen

## 2022-10-14 ENCOUNTER — Telehealth: Payer: Self-pay

## 2022-10-14 NOTE — Telephone Encounter (Signed)
Dr. [name] her insurance needs a letter from you stating that she has a tens unit and that she does home exercise. They are denying the procedure because she hasn't had PT, and she called them, and they told her they need it in writing that she does home exercises and uses the Tens unit.

## 2022-10-24 ENCOUNTER — Telehealth: Payer: Self-pay

## 2022-10-24 NOTE — Telephone Encounter (Signed)
Her insurance denies the procedure requests because she has not had PT. She is willing to try PT if you will put in the order.

## 2022-10-29 NOTE — Addendum Note (Signed)
Addended by: Yevette Edwards on: 10/29/2022 06:10 PM   Modules accepted: Orders

## 2022-10-30 ENCOUNTER — Telehealth: Payer: Self-pay | Admitting: Anesthesiology

## 2022-10-30 NOTE — Telephone Encounter (Signed)
Referral and order has been faxed to [facility] attn [employer].

## 2022-10-30 NOTE — Telephone Encounter (Signed)
States pharmacy has no refill scripts for her. I gave her appt for phone visit med mgmt

## 2022-10-31 ENCOUNTER — Ambulatory Visit: Payer: Medicaid Other | Admitting: Anesthesiology

## 2022-11-01 ENCOUNTER — Ambulatory Visit: Payer: Medicaid Other | Attending: Anesthesiology | Admitting: Anesthesiology

## 2022-11-01 DIAGNOSIS — M5442 Lumbago with sciatica, left side: Secondary | ICD-10-CM | POA: Diagnosis not present

## 2022-11-01 DIAGNOSIS — M47816 Spondylosis without myelopathy or radiculopathy, lumbar region: Secondary | ICD-10-CM

## 2022-11-01 DIAGNOSIS — M5441 Lumbago with sciatica, right side: Secondary | ICD-10-CM

## 2022-11-01 DIAGNOSIS — M5136 Other intervertebral disc degeneration, lumbar region: Secondary | ICD-10-CM

## 2022-11-01 DIAGNOSIS — G8929 Other chronic pain: Secondary | ICD-10-CM

## 2022-11-01 DIAGNOSIS — M542 Cervicalgia: Secondary | ICD-10-CM

## 2022-11-01 DIAGNOSIS — M5431 Sciatica, right side: Secondary | ICD-10-CM

## 2022-11-01 DIAGNOSIS — G894 Chronic pain syndrome: Secondary | ICD-10-CM

## 2022-11-01 DIAGNOSIS — M48062 Spinal stenosis, lumbar region with neurogenic claudication: Secondary | ICD-10-CM

## 2022-11-01 DIAGNOSIS — M5030 Other cervical disc degeneration, unspecified cervical region: Secondary | ICD-10-CM

## 2022-11-01 MED ORDER — OXYCODONE HCL 10 MG PO TABS: 10.0000 mg | ORAL_TABLET | ORAL | 0 refills | Status: AC

## 2022-11-01 MED ORDER — OXYCODONE HCL 10 MG PO TABS: 10.0000 mg | ORAL_TABLET | ORAL | 0 refills | Status: DC

## 2022-11-04 ENCOUNTER — Encounter: Payer: Self-pay | Admitting: Anesthesiology

## 2022-11-04 ENCOUNTER — Telehealth: Payer: Self-pay

## 2022-11-04 ENCOUNTER — Telehealth: Payer: Self-pay | Admitting: Anesthesiology

## 2022-11-04 NOTE — Telephone Encounter (Signed)
[facility] does not accept patients with medicaid. Can you put in the order for PT at [facility]

## 2022-11-04 NOTE — Addendum Note (Signed)
Addended by: Yevette Edwards on: 11/04/2022 12:17 PM   Modules accepted: Orders

## 2022-11-04 NOTE — Telephone Encounter (Signed)
ERROR
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
## 2022-11-15 ENCOUNTER — Other Ambulatory Visit: Payer: Self-pay | Admitting: Anesthesiology

## 2022-12-17 ENCOUNTER — Encounter: Payer: Self-pay | Admitting: Physical Therapy

## 2022-12-17 ENCOUNTER — Ambulatory Visit: Payer: Medicaid Other | Attending: Anesthesiology | Admitting: Physical Therapy

## 2022-12-17 DIAGNOSIS — M542 Cervicalgia: Secondary | ICD-10-CM | POA: Diagnosis not present

## 2022-12-17 DIAGNOSIS — M5459 Other low back pain: Secondary | ICD-10-CM | POA: Insufficient documentation

## 2022-12-17 DIAGNOSIS — R6889 Other general symptoms and signs: Secondary | ICD-10-CM | POA: Insufficient documentation

## 2022-12-17 DIAGNOSIS — G8929 Other chronic pain: Secondary | ICD-10-CM | POA: Diagnosis not present

## 2022-12-17 DIAGNOSIS — R262 Difficulty in walking, not elsewhere classified: Secondary | ICD-10-CM | POA: Insufficient documentation

## 2022-12-17 DIAGNOSIS — M6281 Muscle weakness (generalized): Secondary | ICD-10-CM | POA: Diagnosis present
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
## 2022-12-19 ENCOUNTER — Ambulatory Visit: Payer: Medicaid Other | Admitting: Physical Therapy

## 2022-12-26 ENCOUNTER — Encounter: Payer: Self-pay | Admitting: Physical Therapy

## 2022-12-26 ENCOUNTER — Ambulatory Visit: Payer: Medicaid Other

## 2022-12-26 DIAGNOSIS — R262 Difficulty in walking, not elsewhere classified: Secondary | ICD-10-CM

## 2022-12-26 DIAGNOSIS — M5459 Other low back pain: Secondary | ICD-10-CM | POA: Diagnosis not present

## 2022-12-26 DIAGNOSIS — R6889 Other general symptoms and signs: Secondary | ICD-10-CM

## 2022-12-26 DIAGNOSIS — M6281 Muscle weakness (generalized): Secondary | ICD-10-CM
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
## 2022-12-31 ENCOUNTER — Ambulatory Visit: Payer: Medicaid Other | Admitting: Physical Therapy

## 2023-01-02 ENCOUNTER — Ambulatory Visit: Payer: Medicaid Other | Attending: Anesthesiology | Admitting: Physical Therapy

## 2023-01-02 ENCOUNTER — Encounter: Payer: Self-pay | Admitting: Physical Therapy

## 2023-01-02 DIAGNOSIS — M6281 Muscle weakness (generalized): Secondary | ICD-10-CM | POA: Insufficient documentation

## 2023-01-02 DIAGNOSIS — M5459 Other low back pain: Secondary | ICD-10-CM | POA: Insufficient documentation

## 2023-01-02 DIAGNOSIS — R6889 Other general symptoms and signs: Secondary | ICD-10-CM | POA: Insufficient documentation

## 2023-01-02 DIAGNOSIS — R262 Difficulty in walking, not elsewhere classified: Secondary | ICD-10-CM | POA: Diagnosis present
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
## 2023-01-07 ENCOUNTER — Encounter: Payer: Self-pay | Admitting: Physical Therapy

## 2023-01-07 ENCOUNTER — Encounter: Payer: Self-pay | Admitting: Anesthesiology

## 2023-01-07 ENCOUNTER — Ambulatory Visit: Payer: Medicaid Other | Admitting: Physical Therapy

## 2023-01-07 ENCOUNTER — Ambulatory Visit: Payer: Medicaid Other | Attending: Anesthesiology | Admitting: Anesthesiology

## 2023-01-07 DIAGNOSIS — G8929 Other chronic pain: Secondary | ICD-10-CM

## 2023-01-07 DIAGNOSIS — M47816 Spondylosis without myelopathy or radiculopathy, lumbar region: Secondary | ICD-10-CM | POA: Diagnosis not present

## 2023-01-07 DIAGNOSIS — R6889 Other general symptoms and signs: Secondary | ICD-10-CM

## 2023-01-07 DIAGNOSIS — M5442 Lumbago with sciatica, left side: Secondary | ICD-10-CM | POA: Diagnosis not present

## 2023-01-07 DIAGNOSIS — G894 Chronic pain syndrome: Secondary | ICD-10-CM

## 2023-01-07 DIAGNOSIS — M48062 Spinal stenosis, lumbar region with neurogenic claudication: Secondary | ICD-10-CM | POA: Diagnosis not present

## 2023-01-07 DIAGNOSIS — M5459 Other low back pain: Secondary | ICD-10-CM | POA: Diagnosis not present

## 2023-01-07 DIAGNOSIS — M5441 Lumbago with sciatica, right side: Secondary | ICD-10-CM | POA: Diagnosis not present

## 2023-01-07 DIAGNOSIS — M5030 Other cervical disc degeneration, unspecified cervical region: Secondary | ICD-10-CM

## 2023-01-07 DIAGNOSIS — M6281 Muscle weakness (generalized): Secondary | ICD-10-CM

## 2023-01-07 DIAGNOSIS — R262 Difficulty in walking, not elsewhere classified: Secondary | ICD-10-CM

## 2023-01-07 DIAGNOSIS — M542 Cervicalgia: Secondary | ICD-10-CM

## 2023-01-07 DIAGNOSIS — M5431 Sciatica, right side: Secondary | ICD-10-CM

## 2023-01-07 MED ORDER — OXYCODONE HCL 10 MG PO TABS: 10.0000 mg | ORAL_TABLET | ORAL | 0 refills | Status: DC

## 2023-01-07 MED ORDER — OXYCODONE HCL 10 MG PO TABS: 10.0000 mg | ORAL_TABLET | ORAL | 0 refills | Status: DC | PRN
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
## 2023-01-09 ENCOUNTER — Ambulatory Visit: Payer: Medicaid Other | Admitting: Physical Therapy

## 2023-01-14 ENCOUNTER — Ambulatory Visit: Payer: Medicaid Other | Admitting: Physical Therapy

## 2023-01-16 ENCOUNTER — Ambulatory Visit: Payer: Medicaid Other | Admitting: Physical Therapy

## 2023-01-21 ENCOUNTER — Ambulatory Visit: Payer: Medicaid Other | Admitting: Physical Therapy

## 2023-02-05 ENCOUNTER — Ambulatory Visit
Admission: RE | Admit: 2023-02-05 | Discharge: 2023-02-05 | Disposition: A | Discharge status: Home / Self Care | Payer: Medicaid Other | Source: Ambulatory Visit | Attending: Anesthesiology | Admitting: Anesthesiology

## 2023-02-05 ENCOUNTER — Encounter: Payer: Self-pay | Admitting: Anesthesiology

## 2023-02-05 ENCOUNTER — Ambulatory Visit: Payer: Medicaid Other | Admitting: Anesthesiology

## 2023-02-05 ENCOUNTER — Other Ambulatory Visit: Payer: Self-pay | Admitting: Anesthesiology

## 2023-02-05 VITALS — BP 163/88 | HR 80 | Temp 97.8°F | Resp 16 | Ht 65.5 in | Wt 240.0 lb

## 2023-02-05 DIAGNOSIS — M47816 Spondylosis without myelopathy or radiculopathy, lumbar region: Secondary | ICD-10-CM | POA: Insufficient documentation

## 2023-02-05 DIAGNOSIS — M542 Cervicalgia: Secondary | ICD-10-CM | POA: Diagnosis present

## 2023-02-05 DIAGNOSIS — G894 Chronic pain syndrome: Secondary | ICD-10-CM

## 2023-02-05 DIAGNOSIS — M48062 Spinal stenosis, lumbar region with neurogenic claudication: Secondary | ICD-10-CM | POA: Diagnosis not present

## 2023-02-05 DIAGNOSIS — M5442 Lumbago with sciatica, left side: Secondary | ICD-10-CM | POA: Diagnosis present

## 2023-02-05 DIAGNOSIS — M5441 Lumbago with sciatica, right side: Secondary | ICD-10-CM | POA: Diagnosis not present

## 2023-02-05 DIAGNOSIS — M5431 Sciatica, right side: Secondary | ICD-10-CM | POA: Insufficient documentation

## 2023-02-05 DIAGNOSIS — S4491XD Injury of unspecified nerve at shoulder and upper arm level, right arm, subsequent encounter: Secondary | ICD-10-CM | POA: Diagnosis present

## 2023-02-05 DIAGNOSIS — G8929 Other chronic pain: Secondary | ICD-10-CM

## 2023-02-05 MED ORDER — LIDOCAINE HCL (PF) 1 % IJ SOLN
5.0000 mL | Freq: Once | INTRAMUSCULAR | Status: AC
  Administered 2023-02-05: 5 mL via SUBCUTANEOUS

## 2023-02-05 MED ORDER — IOHEXOL 180 MG/ML  SOLN
10.0000 mL | Freq: Once | INTRAMUSCULAR | Status: AC | PRN
Start: 2023-02-05 — End: 2023-02-05
  Administered 2023-02-05: 10 mL via EPIDURAL

## 2023-02-05 MED ORDER — SODIUM CHLORIDE 0.9% FLUSH
10.0000 mL | Freq: Once | INTRAVENOUS | Status: AC
  Administered 2023-02-05: 10 mL

## 2023-02-05 MED ORDER — TRIAMCINOLONE ACETONIDE 40 MG/ML IJ SUSP
40.0000 mg | Freq: Once | INTRAMUSCULAR | Status: AC
  Administered 2023-02-05: 40 mg

## 2023-02-05 MED ORDER — OXYCODONE HCL 10 MG PO TABS: 10.0000 mg | ORAL_TABLET | ORAL | 0 refills | Status: DC

## 2023-02-05 MED ORDER — SODIUM CHLORIDE (PF) 0.9 % IJ SOLN
INTRAMUSCULAR | Status: AC
  Filled 2023-02-05: qty 10

## 2023-02-05 MED ORDER — TRIAMCINOLONE ACETONIDE 40 MG/ML IJ SUSP
INTRAMUSCULAR | Status: AC
Start: 2023-02-05 — End: ?
  Filled 2023-02-05: qty 1

## 2023-02-05 MED ORDER — ROPIVACAINE HCL 2 MG/ML IJ SOLN
10.0000 mL | Freq: Once | INTRAMUSCULAR | Status: AC
  Administered 2023-02-05: 10 mL via EPIDURAL

## 2023-02-05 MED ORDER — LIDOCAINE HCL (PF) 1 % IJ SOLN
INTRAMUSCULAR | Status: AC
  Filled 2023-02-05: qty 10

## 2023-02-05 MED ORDER — IOHEXOL 180 MG/ML  SOLN
INTRAMUSCULAR | Status: AC
  Filled 2023-02-05: qty 20

## 2023-02-05 MED ORDER — ROPIVACAINE HCL 2 MG/ML IJ SOLN
INTRAMUSCULAR | Status: AC
  Filled 2023-02-05: qty 20

## 2023-02-05 NOTE — Addendum Note (Signed)
Addended by: Concepcion Elk on: 02/05/2023 02:17 PM   Modules accepted: Orders

## 2023-02-05 NOTE — Progress Notes (Signed)
Subjective:  Patient ID: Hannah Maynard, female    DOB: [birth year]  Age: 57 y.o. MRN: [id number]  CC: Back Pain (lower)   Procedure: L5-S1 epidural steroid under fluoroscopic guidance with no sedation  HPI Hannah Maynard presents for reevaluation.  Hannah Maynard continues to have bilateral lower extremity sciatica symptoms with the radiating pain starting in the low back described as gnawing aching and occasionally lancinating going into the bilateral calves right side generally greater than left side.  No change in lower extremity strength or function or bowel or bladder function is noted.  She did have recent physical therapy that she felt worsened her pain symptoms.  She reports that epidural steroid injections generally gave her 80% relief lasting about 2 to 3 months before she has recurrence of his same baseline sciatica pain.  She also gets about 75% relief of her low back pain with the injection but is able to get good relief with her chronic opioid therapy which helps with her low back knee hip pain.  Otherwise she is in her usual state of health today with no change in lower extremity strength function bowel or bladder function.  Outpatient Medications Prior to Visit  Medication Sig Dispense Refill   atorvastatin (LIPITOR) 10 MG tablet Take 10 mg by mouth daily.     baclofen (LIORESAL) 10 MG tablet Take 10 mg by mouth 2 (two) times daily.     FLUoxetine (PROZAC) 40 MG capsule Take 40 mg by mouth daily.     Nerve Stimulator (ZEWA TENS/EMS COMBO UNIT) DEVI by Does not apply route.     [START ON 02/11/2023] Oxycodone HCl 10 MG TABS Take 1 tablet (10 mg total) by mouth every 4 (four) hours as needed. 135 tablet 0   pregabalin (LYRICA) 75 MG capsule TAKE 2 CAPSULES BY MOUTH TWICE DAILY 120 capsule 3   pregabalin (LYRICA) 75 MG capsule TAKE 2 CAPSULES BY MOUTH TWICE DAILY 120 capsule 3   sertraline (ZOLOFT) 100 MG tablet Take 200 mg by mouth at bedtime.      triamcinolone cream (KENALOG) 0.1  % Apply 1 application topically 3 (three) times daily as needed.     Oxycodone HCl 10 MG TABS Take 1 tablet (10 mg total) by mouth every 4 (four) hours. 135 tablet 0   fluticasone (FLONASE) 50 MCG/ACT nasal spray Place 1 spray into both nostrils daily. (Patient not taking: Reported on 05/20/2022)     ibuprofen (ADVIL,MOTRIN) 200 MG tablet Take 200 mg by mouth every 6 (six) hours as needed. (Patient not taking: Reported on 05/20/2022)     montelukast (SINGULAIR) 10 MG tablet Take 10 mg by mouth at bedtime.     pregabalin (LYRICA) 75 MG capsule Take 2 capsules (150 mg total) by mouth 2 (two) times daily. 120 capsule 3   PROAIR HFA 108 (90 Base) MCG/ACT inhaler Inhale 2 puffs into the lungs as needed. (Patient not taking: Reported on 07/08/2022)     SYMBICORT 80-4.5 MCG/ACT inhaler Inhale 2 puffs into the lungs 2 (two) times daily. (Patient not taking: Reported on 07/08/2022)     traZODone (DESYREL) 50 MG tablet Take 50 mg by mouth at bedtime.     Facility-Administered Medications Prior to Visit  Medication Dose Route Frequency Provider Last Rate Last Admin   lidocaine (PF) (XYLOCAINE) 1 % injection 5 mL  5 mL Subcutaneous Once Yevette Edwards, MD       ropivacaine (PF) 2 mg/mL (0.2%) (NAROPIN) injection 10 mL  10  mL Epidural Once Yevette Edwards, MD        Review of Systems CNS: No confusion or sedation Cardiac: No angina or palpitations GI: No abdominal pain or constipation Constitutional: No nausea vomiting fevers or chills  Objective:  BP (!) 163/88   Pulse 80   Temp 97.8 F (36.6 C)   Resp 16   Ht 5' 5.5" (1.664 m)   Wt 240 lb (108.9 kg)   LMP  (LMP Unknown)   SpO2 100%   BMI 39.33 kg/m    BP Readings from Last 3 Encounters:  02/05/23 (!) 163/88  07/08/22 130/78  05/20/22 (!) 146/88     Wt Readings from Last 3 Encounters:  02/05/23 240 lb (108.9 kg)  07/08/22 238 lb (108 kg)  05/20/22 223 lb (101.2 kg)     Physical Exam Pt is alert and oriented PERRL EOMI HEART IS RRR  no murmur or rub LCTA no wheezing or rales MUSCULOSKELETAL reveals some paraspinous muscle tenderness but no overt trigger points.  She has a positive straight leg raise on the right side negative on the left.  She does have good muscle tone and bulk.  She walks with antalgic gait.  Labs  No results found for: "HGBA1C" Lab Results  Component Value Date   CREATININE 0.95 05/22/2021    -------------------------------------------------------------------------------------------------------------------- Lab Results  Component Value Date   WBC 5.3 05/22/2021   HGB 13.9 05/22/2021   HCT 43.0 05/22/2021   PLT 176 05/22/2021   GLUCOSE 132 (H) 05/22/2021   ALT 21 05/21/2021   AST 27 05/21/2021   NA 140 05/22/2021   K 4.1 05/22/2021   CL 109 05/22/2021   CREATININE 0.95 05/22/2021   BUN 16 05/22/2021   CO2 24 05/22/2021    --------------------------------------------------------------------------------------------------------------------- DG PAIN CLINIC C-ARM 1-60 MIN NO REPORT  Result Date: 02/05/2023 Fluoro was used, but no Radiologist interpretation will be provided. Please refer to "NOTES" tab for provider progress note.    Assessment & Plan:   Hannah Maynard was seen today for back pain.  Diagnoses and all orders for this visit:  Chronic bilateral low back pain with bilateral sciatica -     Lumbar Epidural Injection -     triamcinolone acetonide (KENALOG-40) injection 40 mg -     sodium chloride flush (NS) 0.9 % injection 10 mL -     ropivacaine (PF) 2 mg/mL (0.2%) (NAROPIN) injection 10 mL -     lidocaine (PF) (XYLOCAINE) 1 % injection 5 mL -     iohexol (OMNIPAQUE) 180 MG/ML injection 10 mL  Lumbar spondylosis -     Lumbar Epidural Injection  Spinal stenosis of lumbar region with neurogenic claudication -     Lumbar Epidural Injection -     triamcinolone acetonide (KENALOG-40) injection 40 mg -     sodium chloride flush (NS) 0.9 % injection 10 mL -     ropivacaine (PF)  2 mg/mL (0.2%) (NAROPIN) injection 10 mL -     lidocaine (PF) (XYLOCAINE) 1 % injection 5 mL -     iohexol (OMNIPAQUE) 180 MG/ML injection 10 mL  Other orders -     Oxycodone HCl 10 MG TABS; Take 1 tablet (10 mg total) by mouth every 4 (four) hours.        ----------------------------------------------------------------------------------------------------------------------  Problem List Items Addressed This Visit       Unprioritized   Chronic low back pain (Chronic)   Relevant Medications   triamcinolone acetonide (KENALOG-40) injection 40 mg (Start  on 02/05/2023  2:15 PM)   sodium chloride flush (NS) 0.9 % injection 10 mL (Start on 02/05/2023  2:15 PM)   ropivacaine (PF) 2 mg/mL (0.2%) (NAROPIN) injection 10 mL (Start on 02/05/2023  2:15 PM)   lidocaine (PF) (XYLOCAINE) 1 % injection 5 mL (Start on 02/05/2023  2:15 PM)   iohexol (OMNIPAQUE) 180 MG/ML injection 10 mL   Oxycodone HCl 10 MG TABS (Start on 03/13/2023)   Lumbar spondylosis   Relevant Medications   triamcinolone acetonide (KENALOG-40) injection 40 mg (Start on 02/05/2023  2:15 PM)   Oxycodone HCl 10 MG TABS (Start on 03/13/2023)   Other Visit Diagnoses     Spinal stenosis of lumbar region with neurogenic claudication       Relevant Medications   triamcinolone acetonide (KENALOG-40) injection 40 mg (Start on 02/05/2023  2:15 PM)   sodium chloride flush (NS) 0.9 % injection 10 mL (Start on 02/05/2023  2:15 PM)   ropivacaine (PF) 2 mg/mL (0.2%) (NAROPIN) injection 10 mL (Start on 02/05/2023  2:15 PM)   lidocaine (PF) (XYLOCAINE) 1 % injection 5 mL (Start on 02/05/2023  2:15 PM)   iohexol (OMNIPAQUE) 180 MG/ML injection 10 mL   Oxycodone HCl 10 MG TABS (Start on 03/13/2023)         ----------------------------------------------------------------------------------------------------------------------  1. Chronic bilateral low back pain with bilateral sciatica Will proceed with a repeat epidural today.  We have gone  over the risks and benefits of the procedure all questions were answered and she desires to proceed with this.  I encouraged her to continue efforts at weight loss and continue core stretching strengthening exercises for core management.  Will schedule her for return to clinic in 1 month. - Lumbar Epidural Injection - triamcinolone acetonide (KENALOG-40) injection 40 mg - sodium chloride flush (NS) 0.9 % injection 10 mL - ropivacaine (PF) 2 mg/mL (0.2%) (NAROPIN) injection 10 mL - lidocaine (PF) (XYLOCAINE) 1 % injection 5 mL - iohexol (OMNIPAQUE) 180 MG/ML injection 10 mL  2. Lumbar spondylosis As above - Lumbar Epidural Injection  3. Spinal stenosis of lumbar region with neurogenic claudication As above - Lumbar Epidural Injection - triamcinolone acetonide (KENALOG-40) injection 40 mg - sodium chloride flush (NS) 0.9 % injection 10 mL - ropivacaine (PF) 2 mg/mL (0.2%) (NAROPIN) injection 10 mL - lidocaine (PF) (XYLOCAINE) 1 % injection 5 mL - iohexol (OMNIPAQUE) 180 MG/ML injection 10 mL  1. Right sided sciatica   2. Chronic bilateral low back pain with bilateral sciatica   3. Lumbar spondylosis   4. Spinal stenosis of lumbar region with neurogenic claudication   5. Cervicalgia   6. Chronic pain syndrome   7. Chronic neck pain   8. Facet arthritis of lumbar region   9. Neuropraxia of upper extremity, right, subsequent encounter   I have reviewed the [facility] practitioner database information and it is appropriate.  Will schedule her for return to clinic in 1 month.  Refills for her medications were generated today for the next 2 months.  This to be for December 17 and January 16.  Additionally, I do feel that she has gotten a significant improvement in her neck pain and is having less cervical radiculitis symptoms.  Procedure: L5-S1 LESI with fluoroscopic guidance and without moderate sedation  NOTE: The risks, benefits, and expectations of the procedure have been discussed  and explained to the patient who was understanding and in agreement with suggested treatment plan. No guarantees were made.  DESCRIPTION OF PROCEDURE: Lumbar epidural  steroid injection with no IV Versed, EKG, blood pressure, pulse, and pulse oximetry monitoring. The procedure was performed with the patient in the prone position under fluoroscopic guidance.  Sterile prep x3 was initiated and I then injected subcutaneous lidocaine to the overlying L5-S1 site after its fluoroscopic identifictation.  Using strict aseptic technique, I then advanced an 18-gauge Tuohy epidural needle in the midline using interlaminar approach via loss-of-resistance to saline technique. There was negative aspiration for heme or  CSF.  I then confirmed position with both AP and Lateral fluoroscan.  2 cc of contrast dye were injected and a  total of 5 mL of Preservative-Free normal saline mixed with 40 mg of Kenalog and 1cc Ropicaine 0.2 percent were injected incrementally via the  epidurally placed needle. The needle was removed. The patient tolerated the injection well and was convalesced and discharged to home in stable condition. Should the patient have any post procedure difficulty they have been instructed on how to contact us for assistance.  ----------------------------------------------------------------------------------------------------------------------  I am having Hannah Doom July maintain her ibuprofen, [place] TENS/EMS Combo Unit, ProAir HFA, montelukast, sertraline, Symbicort, atorvastatin, fluticasone, traZODone, triamcinolone cream, pregabalin, baclofen, FLUoxetine, pregabalin, pregabalin, Oxycodone HCl, and Oxycodone HCl. We will continue to administer ropivacaine (PF) 2 mg/mL (0.2%) and lidocaine (PF).   Meds ordered this encounter  Medications   triamcinolone acetonide (KENALOG-40) injection 40 mg   sodium chloride flush (NS) 0.9 % injection 10 mL   ropivacaine (PF) 2 mg/mL (0.2%) (NAROPIN) injection 10 mL    lidocaine (PF) (XYLOCAINE) 1 % injection 5 mL   iohexol (OMNIPAQUE) 180 MG/ML injection 10 mL   Oxycodone HCl 10 MG TABS    Sig: Take 1 tablet (10 mg total) by mouth every 4 (four) hours.    Dispense:  135 tablet    Refill:  0   Patient's Medications  New Prescriptions   No medications on file  Previous Medications   ATORVASTATIN (LIPITOR) 10 MG TABLET    Take 10 mg by mouth daily.   BACLOFEN (LIORESAL) 10 MG TABLET    Take 10 mg by mouth 2 (two) times daily.   FLUOXETINE (PROZAC) 40 MG CAPSULE    Take 40 mg by mouth daily.   FLUTICASONE (FLONASE) 50 MCG/ACT NASAL SPRAY    Place 1 spray into both nostrils daily.   IBUPROFEN (ADVIL,MOTRIN) 200 MG TABLET    Take 200 mg by mouth every 6 (six) hours as needed.   MONTELUKAST (SINGULAIR) 10 MG TABLET    Take 10 mg by mouth at bedtime.   NERVE STIMULATOR (ZEWA TENS/EMS COMBO UNIT) DEVI    by Does not apply route.   OXYCODONE HCL 10 MG TABS    Take 1 tablet (10 mg total) by mouth every 4 (four) hours as needed.   PREGABALIN (LYRICA) 75 MG CAPSULE    Take 2 capsules (150 mg total) by mouth 2 (two) times daily.   PREGABALIN (LYRICA) 75 MG CAPSULE    TAKE 2 CAPSULES BY MOUTH TWICE DAILY   PREGABALIN (LYRICA) 75 MG CAPSULE    TAKE 2 CAPSULES BY MOUTH TWICE DAILY   PROAIR HFA 108 (90 BASE) MCG/ACT INHALER    Inhale 2 puffs into the lungs as needed.   SERTRALINE (ZOLOFT) 100 MG TABLET    Take 200 mg by mouth at bedtime.    SYMBICORT 80-4.5 MCG/ACT INHALER    Inhale 2 puffs into the lungs 2 (two) times daily.   TRAZODONE (DESYREL) 50 MG TABLET  Take 50 mg by mouth at bedtime.   TRIAMCINOLONE CREAM (KENALOG) 0.1 %    Apply 1 application topically 3 (three) times daily as needed.  Modified Medications   Modified Medication Previous Medication   OXYCODONE HCL 10 MG TABS Oxycodone HCl 10 MG TABS      Take 1 tablet (10 mg total) by mouth every 4 (four) hours.    Take 1 tablet (10 mg total) by mouth every 4 (four) hours.  Discontinued Medications   No  medications on file   ----------------------------------------------------------------------------------------------------------------------  Follow-up: Return in about 1 month (around 03/08/2023) for evaluation, med refill.    Yevette Edwards, MD

## 2023-02-05 NOTE — Patient Instructions (Signed)

## 2023-02-05 NOTE — Progress Notes (Signed)
Safety precautions to be maintained throughout the outpatient stay will include: orient to surroundings, keep bed in low position, maintain call bell within reach at all times, provide assistance with transfer out of bed and ambulation.  

## 2023-02-06 ENCOUNTER — Telehealth: Payer: Self-pay

## 2023-02-06 NOTE — Telephone Encounter (Signed)
Post procedure follow up.  Patient states she is doing good.  

## 2023-02-10 LAB — TOXASSURE SELECT 13 (MW), URINE

## 2023-03-10 ENCOUNTER — Encounter: Payer: Self-pay | Admitting: Anesthesiology

## 2023-03-10 ENCOUNTER — Ambulatory Visit: Payer: Medicaid Other | Attending: Anesthesiology | Admitting: Anesthesiology

## 2023-03-10 DIAGNOSIS — G894 Chronic pain syndrome: Secondary | ICD-10-CM

## 2023-03-10 DIAGNOSIS — M47816 Spondylosis without myelopathy or radiculopathy, lumbar region: Secondary | ICD-10-CM | POA: Diagnosis not present

## 2023-03-10 DIAGNOSIS — G8929 Other chronic pain: Secondary | ICD-10-CM

## 2023-03-10 DIAGNOSIS — M5442 Lumbago with sciatica, left side: Secondary | ICD-10-CM | POA: Diagnosis not present

## 2023-03-10 DIAGNOSIS — M5441 Lumbago with sciatica, right side: Secondary | ICD-10-CM | POA: Diagnosis not present

## 2023-03-10 DIAGNOSIS — M48062 Spinal stenosis, lumbar region with neurogenic claudication: Secondary | ICD-10-CM

## 2023-03-10 DIAGNOSIS — M542 Cervicalgia: Secondary | ICD-10-CM

## 2023-03-10 DIAGNOSIS — M5431 Sciatica, right side: Secondary | ICD-10-CM

## 2023-03-10 MED ORDER — OXYCODONE HCL 10 MG PO TABS: 10.0000 mg | ORAL_TABLET | ORAL | 0 refills | Status: DC

## 2023-03-10 MED ORDER — OXYCODONE HCL 10 MG PO TABS: 10.0000 mg | ORAL_TABLET | ORAL | 0 refills | Status: AC | PRN
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
## 2023-03-11 ENCOUNTER — Telehealth: Payer: Medicaid Other | Admitting: Anesthesiology

## 2023-03-31 ENCOUNTER — Encounter: Payer: Self-pay | Admitting: Acute Care

## 2023-04-01 ENCOUNTER — Ambulatory Visit: Payer: Medicaid Other | Admitting: Anesthesiology

## 2023-04-01 ENCOUNTER — Ambulatory Visit
Admission: RE | Admit: 2023-04-01 | Discharge: 2023-04-01 | Disposition: A | Discharge status: Home / Self Care | Payer: Medicaid Other | Source: Ambulatory Visit | Attending: Anesthesiology | Admitting: Anesthesiology

## 2023-04-01 ENCOUNTER — Encounter: Payer: Self-pay | Admitting: Anesthesiology

## 2023-04-01 ENCOUNTER — Other Ambulatory Visit: Payer: Self-pay | Admitting: Anesthesiology

## 2023-04-01 VITALS — BP 151/86 | HR 76 | Temp 97.9°F | Resp 16 | Ht 66.0 in | Wt 246.0 lb

## 2023-04-01 DIAGNOSIS — G8929 Other chronic pain: Secondary | ICD-10-CM | POA: Diagnosis present

## 2023-04-01 DIAGNOSIS — O9279 Other disorders of lactation: Secondary | ICD-10-CM | POA: Diagnosis present

## 2023-04-01 DIAGNOSIS — Z79891 Long term (current) use of opiate analgesic: Secondary | ICD-10-CM | POA: Diagnosis present

## 2023-04-01 DIAGNOSIS — M5442 Lumbago with sciatica, left side: Secondary | ICD-10-CM

## 2023-04-01 DIAGNOSIS — M542 Cervicalgia: Secondary | ICD-10-CM | POA: Insufficient documentation

## 2023-04-01 DIAGNOSIS — G894 Chronic pain syndrome: Secondary | ICD-10-CM | POA: Diagnosis not present

## 2023-04-01 DIAGNOSIS — M48062 Spinal stenosis, lumbar region with neurogenic claudication: Secondary | ICD-10-CM | POA: Diagnosis present

## 2023-04-01 DIAGNOSIS — M5431 Sciatica, right side: Secondary | ICD-10-CM | POA: Insufficient documentation

## 2023-04-01 DIAGNOSIS — M5441 Lumbago with sciatica, right side: Secondary | ICD-10-CM | POA: Diagnosis present

## 2023-04-01 DIAGNOSIS — R52 Pain, unspecified: Secondary | ICD-10-CM | POA: Diagnosis present

## 2023-04-01 DIAGNOSIS — M47816 Spondylosis without myelopathy or radiculopathy, lumbar region: Secondary | ICD-10-CM

## 2023-04-01 MED ORDER — SODIUM CHLORIDE (PF) 0.9 % IJ SOLN
INTRAMUSCULAR | Status: AC
  Filled 2023-04-01: qty 10

## 2023-04-01 MED ORDER — ROPIVACAINE HCL 2 MG/ML IJ SOLN
10.0000 mL | Freq: Once | INTRAMUSCULAR | Status: AC
  Administered 2023-04-01: 10 mL via EPIDURAL

## 2023-04-01 MED ORDER — LIDOCAINE HCL (PF) 1 % IJ SOLN
INTRAMUSCULAR | Status: AC
  Filled 2023-04-01: qty 5

## 2023-04-01 MED ORDER — TRIAMCINOLONE ACETONIDE 40 MG/ML IJ SUSP
40.0000 mg | Freq: Once | INTRAMUSCULAR | Status: AC
  Administered 2023-04-01: 40 mg

## 2023-04-01 MED ORDER — ROPIVACAINE HCL 2 MG/ML IJ SOLN
INTRAMUSCULAR | Status: AC
  Filled 2023-04-01: qty 20

## 2023-04-01 MED ORDER — IOHEXOL 180 MG/ML  SOLN
INTRAMUSCULAR | Status: AC
  Filled 2023-04-01: qty 20

## 2023-04-01 MED ORDER — TRIAMCINOLONE ACETONIDE 40 MG/ML IJ SUSP
INTRAMUSCULAR | Status: AC
  Filled 2023-04-01: qty 1

## 2023-04-01 MED ORDER — SODIUM CHLORIDE 0.9% FLUSH
10.0000 mL | Freq: Once | INTRAVENOUS | Status: AC
  Administered 2023-04-01: 10 mL

## 2023-04-01 MED ORDER — IOHEXOL 180 MG/ML  SOLN
10.0000 mL | Freq: Once | INTRAMUSCULAR | Status: AC | PRN
  Administered 2023-04-01: 10 mL via EPIDURAL

## 2023-04-01 MED ORDER — LIDOCAINE HCL (PF) 1 % IJ SOLN
5.0000 mL | Freq: Once | INTRAMUSCULAR | Status: AC
  Administered 2023-04-01: 5 mL via SUBCUTANEOUS

## 2023-04-01 NOTE — Patient Instructions (Signed)

## 2023-04-02 NOTE — Progress Notes (Signed)
 Subjective:  Patient ID: Hannah Maynard, female    DOB: 22-Apr-1965  Age: 58 y.o. MRN: [id number]  CC: Back Pain (lower)   Procedure: L5-S1 epidural steroid under fluoroscopic guidance with no sedation  HPI Hannah Maynard presents for reevaluation.  Hannah Maynard has reported increasing right lower extremity sciatica symptoms.  Her last epidural was very effective at eliminating the mainstay of her left lower extremity sciatica and her back pain has been stable.  Neck pain has been stable.  She is continuing to take her medications including the oxycodone  10 mg strength 4-5 times a day.  This keeps the neck low back and sciatica pain under generally good control if she is cautious about her activity.  No side effects with the medication are reported and she generally gets about 75% relief lasting about 4 to 6 hours before she has recurrence of her baseline pain.  The 135 tablet/month dosing seems to work well for her with no diverting or illicit use and good relief with the medicine.  Unfortunately she has failed more conservative therapy.  She does have intermittent epidural steroid injections to help with sciatica control and these are also working very well.  She reports about 75 to 90% relief of the sciatica symptoms generally lasting 2 months or more.  She averages about 2-3 epidurals per year on average.  She is continue to try and do her stretching strengthening exercises and ambulation with challenges at weight loss and core strengthening.  Otherwise she is in her usual state of health at this time.  Outpatient Medications Prior to Visit  Medication Sig Dispense Refill   atorvastatin (LIPITOR) 10 MG tablet Take 10 mg by mouth daily.     baclofen  (LIORESAL ) 10 MG tablet TAKE 1 TABLET BY MOUTH TWICE DAILY 60 each 4   doxycycline (VIBRAMYCIN) 100 MG capsule Take 100 mg by mouth 2 (two) times daily.     FLUoxetine (PROZAC) 40 MG capsule Take 40 mg by mouth daily.     fluticasone (FLONASE) 50  MCG/ACT nasal spray Place 1 spray into both nostrils daily.     ibuprofen (ADVIL,MOTRIN) 200 MG tablet Take 200 mg by mouth every 6 (six) hours as needed.     Nerve Stimulator (ZEWA TENS/EMS COMBO UNIT) DEVI by Does not apply route.     Oxycodone  HCl 10 MG TABS Take 1 tablet (10 mg total) by mouth every 4 (four) hours as needed. 135 tablet 0   [START ON 04/15/2023] Oxycodone  HCl 10 MG TABS Take 1 tablet (10 mg total) by mouth every 4 (four) hours. 135 tablet 0   pregabalin  (LYRICA ) 75 MG capsule TAKE 2 CAPSULES BY MOUTH TWICE DAILY 120 capsule 3   pregabalin  (LYRICA ) 75 MG capsule TAKE 2 CAPSULES BY MOUTH TWICE DAILY 120 capsule 3   PROAIR  HFA 108 (90 Base) MCG/ACT inhaler Inhale 2 puffs into the lungs as needed.     sertraline  (ZOLOFT ) 100 MG tablet Take 200 mg by mouth at bedtime.      SYMBICORT 80-4.5 MCG/ACT inhaler Inhale 2 puffs into the lungs 2 (two) times daily.     triamcinolone  cream (KENALOG ) 0.1 % Apply 1 application topically 3 (three) times daily as needed.     montelukast (SINGULAIR) 10 MG tablet Take 10 mg by mouth at bedtime.     pregabalin  (LYRICA ) 75 MG capsule Take 2 capsules (150 mg total) by mouth 2 (two) times daily. 120 capsule 3   traZODone  (DESYREL ) 50 MG tablet Take  50 mg by mouth at bedtime.     Facility-Administered Medications Prior to Visit  Medication Dose Route Frequency Provider Last Rate Last Admin   lidocaine  (PF) (XYLOCAINE ) 1 % injection 5 mL  5 mL Subcutaneous Once [name], MD       ropivacaine  (PF) 2 mg/mL (0.2%) (NAROPIN ) injection 10 mL  10 mL Epidural Once [name], MD        Review of Systems CNS: No confusion or sedation Cardiac: No angina or palpitations GI: No abdominal pain or constipation Constitutional: No nausea vomiting fevers or chills  Objective:  BP (!) 151/86   Pulse 76   Temp 97.9 F (36.6 C) (Temporal)   Resp 16   Ht 5' 6 (1.676 m)   Wt 246 lb (111.6 kg)   LMP  (LMP Unknown)   SpO2 98%   BMI 39.71 kg/m     BP Readings from Last 3 Encounters:  04/01/23 (!) 151/86  02/05/23 (!) 163/88  07/08/22 130/78     Wt Readings from Last 3 Encounters:  04/01/23 246 lb (111.6 kg)  02/05/23 240 lb (108.9 kg)  07/08/22 238 lb (108 kg)     Physical Exam Pt is alert and oriented PERRL EOMI HEART IS RRR no murmur or rub LCTA no wheezing or rales MUSCULOSKELETAL reveals some paraspinous muscle tenderness in the lumbar region but no overt trigger points.  She does walk with an antalgic gait.  Her muscle tone and bulk is at baseline.  She has a positive straight leg raise on the right side.  Negative on the left.  Labs  No results found for: HGBA1C Lab Results  Component Value Date   CREATININE 0.95 05/22/2021    -------------------------------------------------------------------------------------------------------------------- Lab Results  Component Value Date   WBC 5.3 05/22/2021   HGB 13.9 05/22/2021   HCT 43.0 05/22/2021   PLT 176 05/22/2021   GLUCOSE 132 (H) 05/22/2021   ALT 21 05/21/2021   AST 27 05/21/2021   NA 140 05/22/2021   K 4.1 05/22/2021   CL 109 05/22/2021   CREATININE 0.95 05/22/2021   BUN 16 05/22/2021   CO2 24 05/22/2021    --------------------------------------------------------------------------------------------------------------------- DG PAIN CLINIC C-ARM 1-60 MIN NO REPORT Result Date: 04/01/2023 Fluoro was used, but no Radiologist interpretation will be provided. Please refer to NOTES tab for provider progress note.    Assessment & Plan:   Laytoya was seen today for back pain.  Diagnoses and all orders for this visit:  Chronic bilateral low back pain with bilateral sciatica -     Lumbar Epidural Injection  Lumbar spondylosis -     Lumbar Epidural Injection  Spinal stenosis of lumbar region with neurogenic claudication -     Lumbar Epidural Injection  Chronic pain syndrome  Cervicalgia  Right sided sciatica -     Lumbar Epidural  Injection  Chronic neck pain  Facet arthritis of lumbar region  Long term current use of opiate analgesic  Other orders -     triamcinolone  acetonide (KENALOG -40) injection 40 mg -     sodium chloride  flush (NS) 0.9 % injection 10 mL -     ropivacaine  (PF) 2 mg/mL (0.2%) (NAROPIN ) injection 10 mL -     lidocaine  (PF) (XYLOCAINE ) 1 % injection 5 mL -     iohexol  (OMNIPAQUE ) 180 MG/ML injection 10 mL        ----------------------------------------------------------------------------------------------------------------------  Problem List Items Addressed This Visit       Unprioritized  Chronic low back pain - Primary (Chronic)   Cervicalgia   Chronic neck pain   Chronic pain syndrome   Long term current use of opiate analgesic   Lumbar spondylosis   Other Visit Diagnoses       Spinal stenosis of lumbar region with neurogenic claudication       Relevant Medications   triamcinolone  acetonide (KENALOG -40) injection 40 mg (Completed)   ropivacaine  (PF) 2 mg/mL (0.2%) (NAROPIN ) injection 10 mL (Completed)   lidocaine  (PF) (XYLOCAINE ) 1 % injection 5 mL (Completed)     Right sided sciatica         Facet arthritis of lumbar region       Relevant Medications   triamcinolone  acetonide (KENALOG -40) injection 40 mg (Completed)         ----------------------------------------------------------------------------------------------------------------------  1. Chronic bilateral low back pain with bilateral sciatica (Primary) Will proceed with a repeat epidural today will favor the right interlaminar approach at L5-S1.  We gone over the risks and benefits of the procedure with her in full detail all questions were answered.  I want her to continue core stretching strengthening and efforts at weight loss.  No other changes in her pharmacologic regimen will be initiated at this time. - Lumbar Epidural Injection  2. Lumbar spondylosis As above - Lumbar Epidural Injection  3.  Spinal stenosis of lumbar region with neurogenic claudication As above - Lumbar Epidural Injection  4. Chronic pain syndrome I have reviewed the [place]  practitioner database information is appropriate.  Refills are present for February 18.  Will schedule her for return to clinic eval in 1 month.  5. Cervicalgia As above  6. Right sided sciatica As above - Lumbar Epidural Injection  7. Chronic neck pain   8. Facet arthritis of lumbar region   9. Long term current use of opiate analgesic Continue follow-up with her primary care physicians for baseline medical care.    ----------------------------------------------------------------------------------------------------------------------  I am having Hannah HAMS Saini maintain her ibuprofen, [place] TENS/EMS Combo Unit, ProAir  HFA, montelukast, sertraline , Symbicort, atorvastatin, fluticasone, traZODone , triamcinolone  cream, pregabalin , FLUoxetine, baclofen , pregabalin , pregabalin , Oxycodone  HCl, Oxycodone  HCl, and doxycycline. We administered triamcinolone  acetonide, sodium chloride  flush, ropivacaine  (PF) 2 mg/mL (0.2%), lidocaine  (PF), and iohexol . We will continue to administer ropivacaine  (PF) 2 mg/mL (0.2%) and lidocaine  (PF).   Meds ordered this encounter  Medications   triamcinolone  acetonide (KENALOG -40) injection 40 mg   sodium chloride  flush (NS) 0.9 % injection 10 mL   ropivacaine  (PF) 2 mg/mL (0.2%) (NAROPIN ) injection 10 mL   lidocaine  (PF) (XYLOCAINE ) 1 % injection 5 mL   iohexol  (OMNIPAQUE ) 180 MG/ML injection 10 mL   Patient's Medications  New Prescriptions   No medications on file  Previous Medications   ATORVASTATIN (LIPITOR) 10 MG TABLET    Take 10 mg by mouth daily.   BACLOFEN  (LIORESAL ) 10 MG TABLET    TAKE 1 TABLET BY MOUTH TWICE DAILY   DOXYCYCLINE (VIBRAMYCIN) 100 MG CAPSULE    Take 100 mg by mouth 2 (two) times daily.   FLUOXETINE (PROZAC) 40 MG CAPSULE    Take 40 mg by mouth daily.    FLUTICASONE (FLONASE) 50 MCG/ACT NASAL SPRAY    Place 1 spray into both nostrils daily.   IBUPROFEN (ADVIL,MOTRIN) 200 MG TABLET    Take 200 mg by mouth every 6 (six) hours as needed.   MONTELUKAST (SINGULAIR) 10 MG TABLET    Take 10 mg by mouth at bedtime.   NERVE STIMULATOR (ZEWA TENS/EMS  COMBO UNIT) DEVI    by Does not apply route.   OXYCODONE  HCL 10 MG TABS    Take 1 tablet (10 mg total) by mouth every 4 (four) hours as needed.   OXYCODONE  HCL 10 MG TABS    Take 1 tablet (10 mg total) by mouth every 4 (four) hours.   PREGABALIN  (LYRICA ) 75 MG CAPSULE    Take 2 capsules (150 mg total) by mouth 2 (two) times daily.   PREGABALIN  (LYRICA ) 75 MG CAPSULE    TAKE 2 CAPSULES BY MOUTH TWICE DAILY   PREGABALIN  (LYRICA ) 75 MG CAPSULE    TAKE 2 CAPSULES BY MOUTH TWICE DAILY   PROAIR  HFA 108 (90 BASE) MCG/ACT INHALER    Inhale 2 puffs into the lungs as needed.   SERTRALINE  (ZOLOFT ) 100 MG TABLET    Take 200 mg by mouth at bedtime.    SYMBICORT 80-4.5 MCG/ACT INHALER    Inhale 2 puffs into the lungs 2 (two) times daily.   TRAZODONE  (DESYREL ) 50 MG TABLET    Take 50 mg by mouth at bedtime.   TRIAMCINOLONE  CREAM (KENALOG ) 0.1 %    Apply 1 application topically 3 (three) times daily as needed.  Modified Medications   No medications on file  Discontinued Medications   No medications on file   ----------------------------------------------------------------------------------------------------------------------  Follow-up: Return in about 1 month (around 04/29/2023) for evaluation, med refill.   Procedure: L5-S1 LESI with fluoroscopic guidance and without moderate sedation  NOTE: The risks, benefits, and expectations of the procedure have been discussed and explained to the patient who was understanding and in agreement with suggested treatment plan. No guarantees were made.  DESCRIPTION OF PROCEDURE: Lumbar epidural steroid injection with no IV Versed , EKG, blood pressure, pulse, and pulse oximetry  monitoring. The procedure was performed with the patient in the prone position under fluoroscopic guidance.  Sterile prep x3 was initiated and I then injected subcutaneous lidocaine  to the overlying L5-S1 site after its fluoroscopic identifictation.  Using strict aseptic technique, I then advanced an 18-gauge Tuohy epidural needle in the midline using interlaminar approach via loss-of-resistance to saline technique. There was negative aspiration for heme or  CSF.  I then confirmed position with both AP and Lateral fluoroscan.  2 cc of contrast dye were injected and a  total of 5 mL of Preservative-Free normal saline mixed with 40 mg of Kenalog  and 1cc Ropicaine 0.2 percent were injected incrementally via the  epidurally placed needle. The needle was removed. The patient tolerated the injection well and was convalesced and discharged to home in stable condition. Should the patient have any post procedure difficulty they have been instructed on how to contact us  for assistance.   Lynwood KANDICE Clause, MD

## 2023-04-17 ENCOUNTER — Other Ambulatory Visit: Payer: Self-pay | Admitting: Anesthesiology

## 2023-04-29 ENCOUNTER — Ambulatory Visit: Payer: Medicaid Other | Attending: Anesthesiology | Admitting: Anesthesiology

## 2023-04-29 ENCOUNTER — Encounter: Payer: Self-pay | Admitting: Anesthesiology

## 2023-04-29 DIAGNOSIS — M5442 Lumbago with sciatica, left side: Secondary | ICD-10-CM

## 2023-04-29 DIAGNOSIS — M47816 Spondylosis without myelopathy or radiculopathy, lumbar region: Secondary | ICD-10-CM | POA: Diagnosis not present

## 2023-04-29 DIAGNOSIS — M48062 Spinal stenosis, lumbar region with neurogenic claudication: Secondary | ICD-10-CM

## 2023-04-29 DIAGNOSIS — M5441 Lumbago with sciatica, right side: Secondary | ICD-10-CM | POA: Diagnosis not present

## 2023-04-29 DIAGNOSIS — M5136 Other intervertebral disc degeneration, lumbar region with discogenic back pain only: Secondary | ICD-10-CM

## 2023-04-29 DIAGNOSIS — M5030 Other cervical disc degeneration, unspecified cervical region: Secondary | ICD-10-CM

## 2023-04-29 DIAGNOSIS — M5431 Sciatica, right side: Secondary | ICD-10-CM

## 2023-04-29 DIAGNOSIS — G8929 Other chronic pain: Secondary | ICD-10-CM

## 2023-04-29 DIAGNOSIS — G894 Chronic pain syndrome: Secondary | ICD-10-CM

## 2023-04-29 DIAGNOSIS — M542 Cervicalgia: Secondary | ICD-10-CM

## 2023-04-29 MED ORDER — OXYCODONE HCL 10 MG PO TABS: 10.0000 mg | ORAL_TABLET | ORAL | 0 refills | Status: DC

## 2023-04-29 MED ORDER — OXYCODONE HCL 10 MG PO TABS: 10.0000 mg | ORAL_TABLET | ORAL | 0 refills | Status: AC

## 2023-04-29 NOTE — Patient Instructions (Signed)
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
## 2023-06-04 ENCOUNTER — Telehealth: Payer: Self-pay

## 2023-06-04 NOTE — Telephone Encounter (Signed)
 NIA has asked for specific clinicals addressed below. The clinical notes reflect this information on page one,dos 04/29/23 Response to previous injection performed in this same region - She got 100 % relief of sciatica and 80% relief of low back pain. DOS of last injection was  04/01/23 and the low back pain is starting to flare up again now. Planning injection in May so the duration of relief would be 3 months.

## 2023-06-30 ENCOUNTER — Other Ambulatory Visit: Payer: Self-pay | Admitting: Anesthesiology

## 2023-06-30 ENCOUNTER — Ambulatory Visit (HOSPITAL_BASED_OUTPATIENT_CLINIC_OR_DEPARTMENT_OTHER): Admitting: Anesthesiology

## 2023-06-30 ENCOUNTER — Encounter: Payer: Self-pay | Admitting: Anesthesiology

## 2023-06-30 ENCOUNTER — Ambulatory Visit
Admission: RE | Admit: 2023-06-30 | Discharge: 2023-06-30 | Disposition: A | Discharge status: Home / Self Care | Source: Ambulatory Visit | Attending: Anesthesiology | Admitting: Anesthesiology

## 2023-06-30 VITALS — BP 143/93 | HR 82 | Temp 98.1°F | Resp 22 | Ht 66.0 in | Wt 249.3 lb

## 2023-06-30 DIAGNOSIS — M5441 Lumbago with sciatica, right side: Secondary | ICD-10-CM | POA: Diagnosis not present

## 2023-06-30 DIAGNOSIS — M5136 Other intervertebral disc degeneration, lumbar region with discogenic back pain only: Secondary | ICD-10-CM

## 2023-06-30 DIAGNOSIS — G894 Chronic pain syndrome: Secondary | ICD-10-CM | POA: Diagnosis present

## 2023-06-30 DIAGNOSIS — M5442 Lumbago with sciatica, left side: Secondary | ICD-10-CM | POA: Insufficient documentation

## 2023-06-30 DIAGNOSIS — M47816 Spondylosis without myelopathy or radiculopathy, lumbar region: Secondary | ICD-10-CM

## 2023-06-30 DIAGNOSIS — G8929 Other chronic pain: Secondary | ICD-10-CM | POA: Diagnosis present

## 2023-06-30 DIAGNOSIS — R52 Pain, unspecified: Secondary | ICD-10-CM

## 2023-06-30 DIAGNOSIS — M48062 Spinal stenosis, lumbar region with neurogenic claudication: Secondary | ICD-10-CM | POA: Diagnosis not present

## 2023-06-30 DIAGNOSIS — M5431 Sciatica, right side: Secondary | ICD-10-CM

## 2023-06-30 DIAGNOSIS — M542 Cervicalgia: Secondary | ICD-10-CM | POA: Diagnosis present

## 2023-06-30 DIAGNOSIS — Z79891 Long term (current) use of opiate analgesic: Secondary | ICD-10-CM | POA: Diagnosis present

## 2023-06-30 MED ORDER — LIDOCAINE HCL (PF) 1 % IJ SOLN
5.0000 mL | Freq: Once | INTRAMUSCULAR | Status: AC
  Administered 2023-06-30: 5 mL via SUBCUTANEOUS
  Filled 2023-06-30: qty 5

## 2023-06-30 MED ORDER — ROPIVACAINE HCL 2 MG/ML IJ SOLN
10.0000 mL | Freq: Once | INTRAMUSCULAR | Status: AC
  Administered 2023-06-30: 1 mL via EPIDURAL
  Filled 2023-06-30: qty 20

## 2023-06-30 MED ORDER — OXYCODONE HCL 10 MG PO TABS: 10.0000 mg | ORAL_TABLET | ORAL | 0 refills | Status: DC

## 2023-06-30 MED ORDER — TRIAMCINOLONE ACETONIDE 40 MG/ML IJ SUSP
40.0000 mg | Freq: Once | INTRAMUSCULAR | Status: AC
  Administered 2023-06-30: 40 mg
  Filled 2023-06-30: qty 1

## 2023-06-30 MED ORDER — IOHEXOL 180 MG/ML  SOLN
10.0000 mL | Freq: Once | INTRAMUSCULAR | Status: AC | PRN
  Administered 2023-06-30: 10 mL via EPIDURAL
  Filled 2023-06-30: qty 20

## 2023-06-30 MED ORDER — SODIUM CHLORIDE 0.9% FLUSH
10.0000 mL | Freq: Once | INTRAVENOUS | Status: AC
  Administered 2023-06-30: 5 mL

## 2023-06-30 MED ORDER — SODIUM CHLORIDE (PF) 0.9 % IJ SOLN
INTRAMUSCULAR | Status: AC
  Filled 2023-06-30: qty 10

## 2023-06-30 NOTE — Progress Notes (Unsigned)
 Subjective:  Patient ID: Hannah Maynard, female    DOB: Apr 20, 1965  Age: 58 y.o. MRN: [id number]  CC: Back Pain (lower)   Procedure: L5-S1 epidural steroid under fluoroscopic guidance with no sedation  HPI Hannah Maynard presents for reevaluation.  Hannah Maynard continues to have right posterior lateral calf cramping and sciatica symptoms with generalized low back pain.  She periodically has epidural steroid injections to keep the pain under control.  She generally gets 80 to 90% relief affecting the right lower extremity sciatica symptom and significant improvement rated at 75% improvement with the low back.  She also has hip buttock and thigh pain and has been on oxycodone  averaging 5 tablets a day secondary to severe persistent pain that has been poorly managed with more conservative therapy.  She is doing well with chronic opioid therapy.  No change in the quality characteristic or distribution of her low back pain or leg pain is noted.  Leg strength is been at baseline.  She has been compliant with her opioid medications.  No side effects are reported with the medicines.  Otherwise she is in her usual state of health.  Her neck pain is reportedly better with less cervical and trapezius pain.  She has good strength to the upper extremities as well.  Outpatient Medications Prior to Visit  Medication Sig Dispense Refill   atorvastatin (LIPITOR) 10 MG tablet Take 10 mg by mouth daily.     baclofen  (LIORESAL ) 10 MG tablet TAKE 1 TABLET BY MOUTH TWICE DAILY 60 each 4   doxycycline (VIBRAMYCIN) 100 MG capsule Take 100 mg by mouth 2 (two) times daily.     FLUoxetine (PROZAC) 40 MG capsule Take 40 mg by mouth daily.     fluticasone (FLONASE) 50 MCG/ACT nasal spray Place 1 spray into both nostrils daily.     ibuprofen (ADVIL,MOTRIN) 200 MG tablet Take 200 mg by mouth every 6 (six) hours as needed.     Nerve Stimulator (ZEWA TENS/EMS COMBO UNIT) DEVI by Does not apply route.     pregabalin  (LYRICA ) 75  MG capsule Take 2 capsules (150 mg total) by mouth 2 (two) times daily. 120 capsule 3   pregabalin  (LYRICA ) 75 MG capsule TAKE 2 CAPSULES BY MOUTH TWICE DAILY 120 capsule 3   pregabalin  (LYRICA ) 75 MG capsule TAKE 2 CAPSULES BY MOUTH TWICE DAILY 120 capsule 3   PROAIR  HFA 108 (90 Base) MCG/ACT inhaler Inhale 2 puffs into the lungs as needed.     sertraline  (ZOLOFT ) 100 MG tablet Take 200 mg by mouth at bedtime.      SYMBICORT 80-4.5 MCG/ACT inhaler Inhale 2 puffs into the lungs 2 (two) times daily.     traZODone  (DESYREL ) 50 MG tablet Take 50 mg by mouth at bedtime.     triamcinolone  cream (KENALOG ) 0.1 % Apply 1 application topically 3 (three) times daily as needed.     Oxycodone  HCl 10 MG TABS Take 1 tablet (10 mg total) by mouth every 4 (four) hours. 135 tablet 0   montelukast (SINGULAIR) 10 MG tablet Take 10 mg by mouth at bedtime. (Patient not taking: Reported on 06/30/2023)     Facility-Administered Medications Prior to Visit  Medication Dose Route Frequency Provider Last Rate Last Admin   lidocaine  (PF) (XYLOCAINE ) 1 % injection 5 mL  5 mL Subcutaneous Once Lenard Kampf G, MD       ropivacaine  (PF) 2 mg/mL (0.2%) (NAROPIN ) injection 10 mL  10 mL Epidural Once Charina Fons G,  MD        Review of Systems CNS: No confusion or sedation Cardiac: No angina or palpitations GI: No abdominal pain or constipation Constitutional: No nausea vomiting fevers or chills  Objective:  BP (!) 143/93   Pulse 82   Temp 98.1 F (36.7 C)   Resp (!) 22   Ht 5\' 6"  (1.676 m)   Wt 249 lb 4.8 oz (113.1 kg)   LMP  (LMP Unknown)   SpO2 100%   BMI 40.24 kg/m    BP Readings from Last 3 Encounters:  06/30/23 (!) 143/93  04/01/23 (!) 151/86  02/05/23 (!) 163/88     Wt Readings from Last 3 Encounters:  06/30/23 249 lb 4.8 oz (113.1 kg)  04/01/23 246 lb (111.6 kg)  02/05/23 240 lb (108.9 kg)     Physical Exam Pt is alert and oriented PERRL EOMI HEART IS RRR no murmur or rub LCTA no wheezing  or rales MUSCULOSKELETAL reveals less tenderness in the bilateral trapezius muscle and good strength to the upper extremities.  Good hand grasp strength is noted.  She ambulates with an antalgic gait.  Muscle tone and bulk is at baseline.  She has some generalized tenderness throughout the lumbar paraspinous muscles but no overt trigger points are noted.  He does have a positive straight leg raise on the right side negative on the left.  Labs  No results found for: "HGBA1C" Lab Results  Component Value Date   CREATININE 0.95 05/22/2021    -------------------------------------------------------------------------------------------------------------------- Lab Results  Component Value Date   WBC 5.3 05/22/2021   HGB 13.9 05/22/2021   HCT 43.0 05/22/2021   PLT 176 05/22/2021   GLUCOSE 132 (H) 05/22/2021   ALT 21 05/21/2021   AST 27 05/21/2021   NA 140 05/22/2021   K 4.1 05/22/2021   CL 109 05/22/2021   CREATININE 0.95 05/22/2021   BUN 16 05/22/2021   CO2 24 05/22/2021    --------------------------------------------------------------------------------------------------------------------- DG PAIN CLINIC C-ARM 1-60 MIN NO REPORT Result Date: 06/30/2023 Fluoro was used, but no Radiologist interpretation will be provided. Please refer to "NOTES" tab for provider progress note.    Assessment & Plan:   Aizza was seen today for back pain.  Diagnoses and all orders for this visit:  Chronic pain syndrome  Chronic bilateral low back pain with bilateral sciatica -     Lumbar Epidural Injection  Lumbar spondylosis -     Lumbar Epidural Injection  Spinal stenosis of lumbar region with neurogenic claudication -     Lumbar Epidural Injection  Cervicalgia  Right sided sciatica  Degeneration of intervertebral disc of lumbar region with discogenic back pain  Chronic neck pain  Facet arthritis of lumbar region  Long term current use of opiate analgesic  Other orders -      triamcinolone  acetonide (KENALOG -40) injection 40 mg -     sodium chloride  flush (NS) 0.9 % injection 10 mL -     ropivacaine  (PF) 2 mg/mL (0.2%) (NAROPIN ) injection 10 mL -     lidocaine  (PF) (XYLOCAINE ) 1 % injection 5 mL -     iohexol  (OMNIPAQUE ) 180 MG/ML injection 10 mL -     Oxycodone  HCl 10 MG TABS; Take 1 tablet (10 mg total) by mouth every 4 (four) hours.        ----------------------------------------------------------------------------------------------------------------------  Problem List Items Addressed This Visit       Unprioritized   Chronic low back pain (Chronic)   Relevant Medications   Oxycodone   HCl 10 MG TABS (Start on 07/14/2023)   Cervicalgia   Chronic neck pain   Relevant Medications   Oxycodone  HCl 10 MG TABS (Start on 07/14/2023)   Chronic pain syndrome - Primary   Relevant Medications   Oxycodone  HCl 10 MG TABS (Start on 07/14/2023)   Long term current use of opiate analgesic   Lumbar spondylosis   Other Visit Diagnoses       Spinal stenosis of lumbar region with neurogenic claudication       Relevant Medications   triamcinolone  acetonide (KENALOG -40) injection 40 mg (Completed)   Oxycodone  HCl 10 MG TABS (Start on 07/14/2023)     Right sided sciatica         Degeneration of intervertebral disc of lumbar region with discogenic back pain       Relevant Medications   triamcinolone  acetonide (KENALOG -40) injection 40 mg (Completed)   Oxycodone  HCl 10 MG TABS (Start on 07/14/2023)     Facet arthritis of lumbar region       Relevant Medications   triamcinolone  acetonide (KENALOG -40) injection 40 mg (Completed)   Oxycodone  HCl 10 MG TABS (Start on 07/14/2023)         ----------------------------------------------------------------------------------------------------------------------  1. Chronic bilateral low back pain with bilateral sciatica Will proceed with a repeat epidural injection.  She gets these periodic averaging about 2 to 3  injections/year that enable her to keep her sciatica symptom and low back pain under control and more readily manageable with her chronic opioid therapy.  I have gone over the risks and benefits of the procedure with her in full detail all questions were answered.  Continue efforts at weight loss and stretching strengthening with return to clinic scheduled in 1 month. - Lumbar Epidural Injection  2. Lumbar spondylosis As above - Lumbar Epidural Injection  3. Spinal stenosis of lumbar region with neurogenic claudication As above - Lumbar Epidural Injection  4. Chronic pain syndrome (Primary) I have reviewed the [place]  practitioner database information is appropriate for refill.  5. Cervicalgia As above and continue stretching strengthening exercises as previously reviewed  6. Right sided sciatica   7. Degeneration of intervertebral disc of lumbar region with discogenic back pain   8. Chronic neck pain   9. Facet arthritis of lumbar region   10. Long term current use of opiate analgesic As above and continue follow-up with her primary care physicians for baseline medical care with return to clinic in 1 month.    ----------------------------------------------------------------------------------------------------------------------  I am having Hannah Maynard maintain her ibuprofen, [place] TENS/EMS Combo Unit, ProAir  HFA, montelukast, sertraline , Symbicort, atorvastatin, fluticasone, traZODone , triamcinolone  cream, pregabalin , FLUoxetine, baclofen , pregabalin , pregabalin , doxycycline, and Oxycodone  HCl. We administered triamcinolone  acetonide, sodium chloride  flush, ropivacaine  (PF) 2 mg/mL (0.2%), lidocaine  (PF), and iohexol . We will continue to administer ropivacaine  (PF) 2 mg/mL (0.2%) and lidocaine  (PF).   Meds ordered this encounter  Medications   triamcinolone  acetonide (KENALOG -40) injection 40 mg   sodium chloride  flush (NS) 0.9 % injection 10 mL   ropivacaine   (PF) 2 mg/mL (0.2%) (NAROPIN ) injection 10 mL   lidocaine  (PF) (XYLOCAINE ) 1 % injection 5 mL   iohexol  (OMNIPAQUE ) 180 MG/ML injection 10 mL   Oxycodone  HCl 10 MG TABS    Sig: Take 1 tablet (10 mg total) by mouth every 4 (four) hours.    Dispense:  135 tablet    Refill:  0   Patient's Medications  New Prescriptions   No medications on file  Previous Medications  ATORVASTATIN (LIPITOR) 10 MG TABLET    Take 10 mg by mouth daily.   BACLOFEN  (LIORESAL ) 10 MG TABLET    TAKE 1 TABLET BY MOUTH TWICE DAILY   DOXYCYCLINE (VIBRAMYCIN) 100 MG CAPSULE    Take 100 mg by mouth 2 (two) times daily.   FLUOXETINE (PROZAC) 40 MG CAPSULE    Take 40 mg by mouth daily.   FLUTICASONE (FLONASE) 50 MCG/ACT NASAL SPRAY    Place 1 spray into both nostrils daily.   IBUPROFEN (ADVIL,MOTRIN) 200 MG TABLET    Take 200 mg by mouth every 6 (six) hours as needed.   MONTELUKAST (SINGULAIR) 10 MG TABLET    Take 10 mg by mouth at bedtime.   NERVE STIMULATOR (ZEWA TENS/EMS COMBO UNIT) DEVI    by Does not apply route.   PREGABALIN  (LYRICA ) 75 MG CAPSULE    Take 2 capsules (150 mg total) by mouth 2 (two) times daily.   PREGABALIN  (LYRICA ) 75 MG CAPSULE    TAKE 2 CAPSULES BY MOUTH TWICE DAILY   PREGABALIN  (LYRICA ) 75 MG CAPSULE    TAKE 2 CAPSULES BY MOUTH TWICE DAILY   PROAIR  HFA 108 (90 BASE) MCG/ACT INHALER    Inhale 2 puffs into the lungs as needed.   SERTRALINE  (ZOLOFT ) 100 MG TABLET    Take 200 mg by mouth at bedtime.    SYMBICORT 80-4.5 MCG/ACT INHALER    Inhale 2 puffs into the lungs 2 (two) times daily.   TRAZODONE  (DESYREL ) 50 MG TABLET    Take 50 mg by mouth at bedtime.   TRIAMCINOLONE  CREAM (KENALOG ) 0.1 %    Apply 1 application topically 3 (three) times daily as needed.  Modified Medications   Modified Medication Previous Medication   OXYCODONE  HCL 10 MG TABS Oxycodone  HCl 10 MG TABS      Take 1 tablet (10 mg total) by mouth every 4 (four) hours.    Take 1 tablet (10 mg total) by mouth every 4 (four) hours.   Discontinued Medications   No medications on file   ----------------------------------------------------------------------------------------------------------------------  Follow-up: Return in about 1 month (around 07/31/2023) for evaluation, med refill.   Procedure: L5-S1 LESI with fluoroscopic guidance and without moderate sedation  NOTE: The risks, benefits, and expectations of the procedure have been discussed and explained to the patient who was understanding and in agreement with suggested treatment plan. No guarantees were made.  DESCRIPTION OF PROCEDURE: Lumbar epidural steroid injection with no IV Versed , EKG, blood pressure, pulse, and pulse oximetry monitoring. The procedure was performed with the patient in the prone position under fluoroscopic guidance.  Sterile prep x3 was initiated and I then injected subcutaneous lidocaine  to the overlying 5 S1 site after its fluoroscopic identifictation.  Using strict aseptic technique, I then advanced an 18-gauge Tuohy epidural needle in the midline using interlaminar approach via loss-of-resistance to saline technique. There was negative aspiration for heme or  CSF.  I then confirmed position with both AP and Lateral fluoroscan.  2 cc of contrast dye were injected and a  total of 5 mL of Preservative-Free normal saline mixed with 40 mg of Kenalog  and 1cc Ropicaine 0.2 percent were injected incrementally via the  epidurally placed needle. The needle was removed. The patient tolerated the injection well and was convalesced and discharged to home in stable condition. Should the patient have any post procedure difficulty they have been instructed on how to contact us  for assistance.   Zula Hitch, MD

## 2023-06-30 NOTE — Patient Instructions (Signed)

## 2023-06-30 NOTE — Progress Notes (Unsigned)
 Safety precautions to be maintained throughout the outpatient stay will include: orient to surroundings, keep bed in low position, maintain call bell within reach at all times, provide assistance with transfer out of bed and ambulation.

## 2023-07-01 ENCOUNTER — Telehealth: Payer: Self-pay

## 2023-07-01 NOTE — Telephone Encounter (Signed)
 Post procedure follow up,. Patient states she is doing fine.

## 2023-08-11 ENCOUNTER — Encounter: Payer: Self-pay | Admitting: Anesthesiology

## 2023-08-11 ENCOUNTER — Telehealth: Payer: Self-pay | Admitting: Anesthesiology

## 2023-08-11 ENCOUNTER — Ambulatory Visit: Attending: Anesthesiology | Admitting: Anesthesiology

## 2023-08-11 DIAGNOSIS — M47816 Spondylosis without myelopathy or radiculopathy, lumbar region: Secondary | ICD-10-CM

## 2023-08-11 DIAGNOSIS — M5441 Lumbago with sciatica, right side: Secondary | ICD-10-CM

## 2023-08-11 DIAGNOSIS — G8929 Other chronic pain: Secondary | ICD-10-CM

## 2023-08-11 DIAGNOSIS — M542 Cervicalgia: Secondary | ICD-10-CM

## 2023-08-11 DIAGNOSIS — M5136 Other intervertebral disc degeneration, lumbar region with discogenic back pain only: Secondary | ICD-10-CM

## 2023-08-11 DIAGNOSIS — M48062 Spinal stenosis, lumbar region with neurogenic claudication: Secondary | ICD-10-CM | POA: Diagnosis not present

## 2023-08-11 DIAGNOSIS — G894 Chronic pain syndrome: Secondary | ICD-10-CM

## 2023-08-11 DIAGNOSIS — M5442 Lumbago with sciatica, left side: Secondary | ICD-10-CM

## 2023-08-11 MED ORDER — OXYCODONE HCL 10 MG PO TABS: 10.0000 mg | ORAL_TABLET | ORAL | 0 refills | Status: AC

## 2023-08-11 MED ORDER — OXYCODONE HCL 10 MG PO TABS: 10.0000 mg | ORAL_TABLET | ORAL | 0 refills | Status: DC

## 2023-08-11 NOTE — Telephone Encounter (Signed)
 PT called stated that she need a refill on oxycodone . PT will like to be added to [facility] schedule for today. PT stated that she out. Please give patient a call, and if I can add patient let me know. TY
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
## 2023-08-11 NOTE — Telephone Encounter (Signed)
 Patient may be added to today's schedule.

## 2023-08-18 ENCOUNTER — Other Ambulatory Visit: Payer: Self-pay | Admitting: Family Medicine

## 2023-08-18 DIAGNOSIS — Z1231 Encounter for screening mammogram for malignant neoplasm of breast: Secondary | ICD-10-CM

## 2023-08-20 ENCOUNTER — Telehealth: Payer: Self-pay

## 2023-08-20 NOTE — Telephone Encounter (Addendum)
 They are denying her esi due to missing documentation. Please see below what we need in the notes. Notes that say back pain level is at least a 6. Notes to say what things she cannot do due to her pain.  Notes that say she did PT and back exercises since last shot. Notes that say she had 50% pain relief from previous injection for 2 months. Notes to say how the shot helped her do things that were too painful before. Notes to say she has not had four of the same type of epidural injections within 12 months.  This is what needs to be documented and then I will try to resubmit if they will let me.  OR you can call them to do a P2P.. You can call [phone] and use tracking number [id number].

## 2023-08-26 NOTE — Telephone Encounter (Signed)
 Still waiting on your response on this. Please advise. I need to appeal but I can't without added information.

## 2023-09-04 NOTE — Addendum Note (Signed)
 Addended by: [name] on: 09/04/2023 06:47 AM   Modules accepted: Orders

## 2023-10-07 ENCOUNTER — Ambulatory Visit: Admitting: Anesthesiology

## 2023-10-08 ENCOUNTER — Ambulatory Visit: Admitting: Anesthesiology

## 2023-10-08 ENCOUNTER — Encounter: Payer: Self-pay | Admitting: Anesthesiology

## 2023-10-08 ENCOUNTER — Other Ambulatory Visit: Payer: Self-pay | Admitting: Anesthesiology

## 2023-10-08 ENCOUNTER — Ambulatory Visit
Admission: RE | Admit: 2023-10-08 | Discharge: 2023-10-08 | Disposition: A | Discharge status: Home / Self Care | Source: Ambulatory Visit | Attending: Anesthesiology | Admitting: Anesthesiology

## 2023-10-08 VITALS — BP 161/110 | HR 81 | Temp 97.2°F | Resp 15 | Ht 65.0 in | Wt 245.0 lb

## 2023-10-08 DIAGNOSIS — M48062 Spinal stenosis, lumbar region with neurogenic claudication: Secondary | ICD-10-CM

## 2023-10-08 DIAGNOSIS — G8929 Other chronic pain: Secondary | ICD-10-CM | POA: Diagnosis present

## 2023-10-08 DIAGNOSIS — M5441 Lumbago with sciatica, right side: Secondary | ICD-10-CM | POA: Insufficient documentation

## 2023-10-08 DIAGNOSIS — M5431 Sciatica, right side: Secondary | ICD-10-CM | POA: Insufficient documentation

## 2023-10-08 DIAGNOSIS — G894 Chronic pain syndrome: Secondary | ICD-10-CM | POA: Insufficient documentation

## 2023-10-08 DIAGNOSIS — M5442 Lumbago with sciatica, left side: Secondary | ICD-10-CM

## 2023-10-08 DIAGNOSIS — Z79891 Long term (current) use of opiate analgesic: Secondary | ICD-10-CM | POA: Diagnosis present

## 2023-10-08 DIAGNOSIS — M5136 Other intervertebral disc degeneration, lumbar region with discogenic back pain only: Secondary | ICD-10-CM

## 2023-10-08 DIAGNOSIS — M542 Cervicalgia: Secondary | ICD-10-CM | POA: Diagnosis present

## 2023-10-08 DIAGNOSIS — M47816 Spondylosis without myelopathy or radiculopathy, lumbar region: Secondary | ICD-10-CM | POA: Insufficient documentation

## 2023-10-08 MED ORDER — ROPIVACAINE HCL 2 MG/ML IJ SOLN
INTRAMUSCULAR | Status: AC
  Filled 2023-10-08: qty 20

## 2023-10-08 MED ORDER — ROPIVACAINE HCL 2 MG/ML IJ SOLN
10.0000 mL | Freq: Once | INTRAMUSCULAR | Status: AC
  Administered 2023-10-08 (×2): 2 mL via EPIDURAL

## 2023-10-08 MED ORDER — TRIAMCINOLONE ACETONIDE 40 MG/ML IJ SUSP
40.0000 mg | Freq: Once | INTRAMUSCULAR | Status: AC
  Administered 2023-10-08 (×2): 40 mg

## 2023-10-08 MED ORDER — IOHEXOL 180 MG/ML  SOLN
INTRAMUSCULAR | Status: AC
  Filled 2023-10-08: qty 20

## 2023-10-08 MED ORDER — SODIUM CHLORIDE 0.9% FLUSH
10.0000 mL | Freq: Once | INTRAVENOUS | Status: AC
  Administered 2023-10-08 (×2): 10 mL

## 2023-10-08 MED ORDER — IOHEXOL 180 MG/ML  SOLN
10.0000 mL | Freq: Once | INTRAMUSCULAR | Status: AC | PRN
  Administered 2023-10-08 (×2): 10 mL via EPIDURAL

## 2023-10-08 MED ORDER — LIDOCAINE HCL (PF) 1 % IJ SOLN
5.0000 mL | Freq: Once | INTRAMUSCULAR | Status: AC
  Administered 2023-10-08 (×2): 5 mL via SUBCUTANEOUS

## 2023-10-08 MED ORDER — LIDOCAINE HCL (PF) 1 % IJ SOLN
INTRAMUSCULAR | Status: AC
  Filled 2023-10-08: qty 5

## 2023-10-08 MED ORDER — OXYCODONE HCL 10 MG PO TABS: 10.0000 mg | ORAL_TABLET | ORAL | 0 refills | Status: DC

## 2023-10-08 MED ORDER — SODIUM CHLORIDE (PF) 0.9 % IJ SOLN
INTRAMUSCULAR | Status: AC
Start: 2023-10-08 — End: 2023-10-08
  Filled 2023-10-08: qty 10

## 2023-10-08 MED ORDER — TRIAMCINOLONE ACETONIDE 40 MG/ML IJ SUSP
INTRAMUSCULAR | Status: AC
  Filled 2023-10-08: qty 1

## 2023-10-08 NOTE — Progress Notes (Signed)
Nursing Pain Medication Assessment:  Safety precautions to be maintained throughout the outpatient stay will include: orient to surroundings, keep bed in low position, maintain call bell within reach at all times, provide assistance with transfer out of bed and ambulation.  Medication Inspection Compliance: Hannah Maynard did not comply with our request to bring her pills to be counted. She was reminded that bringing the medication bottles, even when empty, is a requirement.  Medication: None brought in. Pill/Patch Count: None available to be counted. Bottle Appearance: No container available. Did not bring bottle(s) to appointment. Filled Date: N/A Last Medication intake:  TodaySafety precautions to be maintained throughout the outpatient stay will include: orient to surroundings, keep bed in low position, maintain call bell within reach at all times, provide assistance with transfer out of bed and ambulation.

## 2023-10-08 NOTE — Patient Instructions (Addendum)
 Pain Management Discharge Instructions  General Discharge Instructions :  If you need to reach your doctor call: Monday-Friday 8:00 am - 4:00 pm at [phone] or toll free [phone].  After clinic hours [phone] to have operator reach doctor.  Bring all of your medication bottles to all your appointments in the pain clinic.  To cancel or reschedule your appointment with Pain Management please remember to call 24 hours in advance to avoid a fee.  Refer to the educational materials which you have been given on: General Risks, I had my Procedure. Discharge Instructions, Post Sedation.  Post Procedure Instructions:  The drugs you were given will stay in your system until tomorrow, so for the next 24 hours you should not drive, make any legal decisions or drink any alcoholic beverages.  You may eat anything you prefer, but it is better to start with liquids then soups and crackers, and gradually work up to solid foods.  Please notify your doctor immediately if you have any unusual bleeding, trouble breathing or pain that is not related to your normal pain.  Depending on the type of procedure that was done, some parts of your body may feel week and/or numb.  This usually clears up by tonight or the next day.  Walk with the use of an assistive device or accompanied by an adult for the 24 hours.  You may use ice on the affected area for the first 24 hours.  Put ice in a Ziploc bag and cover with a towel and place against area 15 minutes on 15 minutes off.  You may switch to heat after 24 hours.Epidural Steroid Injection Patient Information  Description: The epidural space surrounds the nerves as they exit the spinal cord.  In some patients, the nerves can be compressed and inflamed by a bulging disc or a tight spinal canal (spinal stenosis).  By injecting steroids into the epidural space, we can bring irritated nerves into direct contact with a potentially helpful medication.  These  steroids act directly on the irritated nerves and can reduce swelling and inflammation which often leads to decreased pain.  Epidural steroids may be injected anywhere along the spine and from the neck to the low back depending upon the location of your pain.   After numbing the skin with local anesthetic (like Novocaine), a small needle is passed into the epidural space slowly.  You may experience a sensation of pressure while this is being done.  The entire block usually last less than 10 minutes.  Conditions which may be treated by epidural steroids:  Low back and leg pain Neck and arm pain Spinal stenosis Post-laminectomy syndrome Herpes zoster (shingles) pain Pain from compression fractures  Preparation for the injection:  Do not eat any solid food or dairy products within 8 hours of your appointment.  You may drink clear liquids up to 3 hours before appointment.  Clear liquids include water, black coffee, juice or soda.  No milk or cream please. You may take your regular medication, including pain medications, with a sip of water before your appointment  Diabetics should hold regular insulin (if taken separately) and take 1/2 normal NPH dos the morning of the procedure.  Carry some sugar containing items with you to your appointment. A driver must accompany you and be prepared to drive you home after your procedure.  Bring all your current medications with your. An IV may be inserted and sedation may be given at the discretion of the physician.  A blood pressure cuff, EKG and other monitors will often be applied during the procedure.  Some patients may need to have extra oxygen administered for a short period. You will be asked to provide medical information, including your allergies, prior to the procedure.  We must know immediately if you are taking blood thinners (like Coumadin/Warfarin)  Or if you are allergic to IV iodine contrast (dye). We must know if you could possible be  pregnant.  Possible side-effects: Bleeding from needle site Infection (rare, may require surgery) Nerve injury (rare) Numbness & tingling (temporary) Difficulty urinating (rare, temporary) Spinal headache ( a headache worse with upright posture) Light -headedness (temporary) Pain at injection site (several days) Decreased blood pressure (temporary) Weakness in arm/leg (temporary) Pressure sensation in back/neck (temporary)  Call if you experience: Fever/chills associated with headache or increased back/neck pain. Headache worsened by an upright position. New onset weakness or numbness of an extremity below the injection site Hives or difficulty breathing (go to the emergency room) Inflammation or drainage at the infection site Severe back/neck pain Any new symptoms which are concerning to you  Please note:  Although the local anesthetic injected can often make your back or neck feel good for several hours after the injection, the pain will likely return.  It takes 3-7 days for steroids to work in the epidural space.  You may not notice any pain relief for at least that one week.  If effective, we will often do a series of three injections spaced 3-6 weeks apart to maximally decrease your pain.  After the initial series, we generally will wait several months before considering a repeat injection of the same type.  If you have any questions, please call [phone] [facility] ______________________________________________________________________    Medication Recommendations and Reminders  Applies to: All patients receiving prescriptions (written and/or electronic).  Medication Rules & Regulations: You are responsible for reading, knowing, and following our Medication Rules document. These exist for your safety and that of others. They are not flexible and neither are we. Dismissing or ignoring them is an act of non-compliance that may  result in complete and irreversible termination of such medication therapy. For safety reasons, non-compliance will not be tolerated. As with the [place] fundamental legal principle of ignorance of the law is no defense, we will accept no excuses for not having read and knowing the content of documents provided to you by our practice.  Pharmacy of record:  Definition: This is the pharmacy where your electronic prescriptions will be sent.  We do not endorse any particular pharmacy. It is up to you and your insurance to decide what pharmacy to use.  We do not restrict you in your choice of pharmacy. However, once we write for your prescriptions, we will NOT be re-sending more prescriptions to fix restricted supply problems created by your pharmacy, or your insurance.  The pharmacy listed in the electronic medical record should be the one where you want electronic prescriptions to be sent. If you choose to change pharmacy, simply notify our nursing staff. Changes will be made only during your regular appointments and not over the phone.  Recommendations: Keep all of your pain medications in a safe place, under lock and key, even if you live alone. We will NOT replace lost, stolen, or damaged medication. We do not accept Police Reports as proof of medications having been stolen. After you fill your prescription, take 1 week's worth of pills and put them  away in a safe place. You should keep a separate, properly labeled bottle for this purpose. The remainder should be kept in the original bottle. Use this as your primary supply, until it runs out. Once it's gone, then you know that you have 1 week's worth of medicine, and it is time to come in for a prescription refill. If you do this correctly, it is unlikely that you will ever run out of medicine. To make sure that the above recommendation works, it is very important that you make sure your medication refill appointments are scheduled at least 1 week  before you run out of medicine. To do this in an effective manner, make sure that you do not leave the office without scheduling your next medication management appointment. Always ask the nursing staff to show you in your prescription , when your medication will be running out. Then arrange for the receptionist to get you a return appointment, at least 7 days before you run out of medicine. Do not wait until you have 1 or 2 pills left, to come in. This is very poor planning and does not take into consideration that we may need to cancel appointments due to bad weather, sickness, or emergencies affecting our staff. DO NOT ACCEPT A Partial Fill: If for any reason your pharmacy does not have enough pills/tablets to completely fill or refill your prescription, do not allow for a partial fill. The law allows the pharmacy to complete that prescription within 72 hours, without requiring a new prescription. If they do not fill the rest of your prescription within those 72 hours, you will need a separate prescription to fill the remaining amount, which we will NOT provide. If the reason for the partial fill is your insurance, you will need to talk to the pharmacist about payment alternatives for the remaining tablets, but again, DO NOT ACCEPT A PARTIAL FILL, unless you can trust your pharmacist to obtain the remainder of the pills within 72 hours.  Prescription refills and/or changes in medication(s):  Prescription refills, and/or changes in dose or medication, will be conducted only during scheduled medication management appointments. (Applies to both, written and electronic prescriptions.) No refills on procedure days. No medication will be changed or started on procedure days. No changes, adjustments, and/or refills will be conducted on a procedure day. Doing so will interfere with the diagnostic portion of the procedure. No phone refills. No medications will be called into the pharmacy. No Fax refills. No  weekend refills. No Holliday refills. No after hours refills.  Remember:  Business hours are:  Monday to Thursday 8:00 AM to 4:00 PM Provider's Schedule: Eric Como, MD - Appointments are:  Medication management: Monday and Wednesday 8:00 AM to 4:00 PM Procedure day: Tuesday and Thursday 7:30 AM to 4:00 PM Wallie Sherry, MD - Appointments are:  Medication management: Tuesday and Thursday 8:00 AM to 4:00 PM Procedure day: Monday and Wednesday 7:30 AM to 4:00 PM (Last update: 12/18/2021) ______________________________________________________________________     ______________________________________________________________________    Medication Rules  Purpose: To inform patients, and their family members, of our medication rules and regulations.  Applies to: All patients receiving prescriptions from our practice (written or electronic).  Pharmacy of record: This is the pharmacy where your electronic prescriptions will be sent. Make sure we have the correct one.  Electronic prescriptions: In compliance with the [place]  Strengthen Opioid Misuse Prevention (STOP) Act of 2017 (Session Law 2017-74/H243), effective February 25, 2018, all controlled substances must be  electronically prescribed. Written prescriptions, faxing, or calling prescriptions to a pharmacy will no longer be done.  Prescription refills: These will be provided only during in-person appointments. No medications will be renewed without a face-to-face evaluation with your provider. Applies to all prescriptions.  NOTE: The following applies primarily to controlled substances (Opioid* Pain Medications).   Type of encounter (visit): For patients receiving controlled substances, face-to-face visits are required. (Not an option and not up to the patient.)  Patient's Responsibilities: Pain Pills: Bring all pain pills to every appointment (except for procedure appointments). Pill counts are required.  Pill  Bottles: Bring pills in original pharmacy bottle. Bring bottle, even if empty. Always bring the bottle of the most recent fill.  Medication refills: You are responsible for knowing and keeping track of what medications you are taking and when is it that you will need a refill. The day before your appointment: write a list of all prescriptions that need to be refilled. The day of the appointment: give the list to the admitting nurse. Prescriptions will be written only during appointments. No prescriptions will be written on procedure days. If you forget a medication: it will not be Called in, Faxed, or electronically sent. You will need to get another appointment to get these prescribed. No early refills. Do not call asking to have your prescription filled early. Partial  or short prescriptions: Occasionally your pharmacy may not have enough pills to fill your prescription.  NEVER ACCEPT a partial fill or a prescription that is short of the total amount of pills that you were prescribed.  With controlled substances the law allows 72 hours for the pharmacy to complete the prescription.  If the prescription is not completed within 72 hours, the pharmacist will require a new prescription to be written. This means that you will be short on your medicine and we WILL NOT send another prescription to complete your original prescription.  Instead, request the pharmacy to send a carrier to a nearby branch to get enough medication to provide you with your full prescription. Prescription Accuracy: You are responsible for carefully inspecting your prescriptions before leaving our office. Have the discharge nurse carefully go over each prescription with you, before taking them home. Make sure that your name is accurately spelled, that your address is correct. Check the name and dose of your medication to make sure it is accurate. Check the number of pills, and the written instructions to make sure they are clear and  accurate. Make sure that you are given enough medication to last until your next medication refill appointment. Taking Medication: Take medication as prescribed. When it comes to controlled substances, taking less pills or less frequently than prescribed is permitted and encouraged. Never take more pills than instructed. Never take the medication more frequently than prescribed.  Inform other Doctors: Always inform, all of your healthcare providers, of all the medications you take. Pain Medication from other Providers: You are not allowed to accept any additional pain medication from any other Doctor or Healthcare provider. There are two exceptions to this rule. (see below) In the event that you require additional pain medication, you are responsible for notifying us , as stated below. Cough Medicine: Often these contain an opioid, such as codeine or hydrocodone . Never accept or take cough medicine containing these opioids if you are already taking an opioid* medication. The combination may cause respiratory failure and death. Medication Agreement: You are responsible for carefully reading and following our Medication Agreement. This must be  signed before receiving any prescriptions from our practice. Safely store a copy of your signed Agreement. Violations to the Agreement will result in no further prescriptions. (Additional copies of our Medication Agreement are available upon request.) Laws, Rules, & Regulations: All patients are expected to follow all [address] and [employer], Rules, [employer] [place] [employer]. Ignorance of the Laws does not constitute a valid excuse.  Illegal drugs and Controlled Substances: The use of illegal substances (including, but not limited to marijuana and its derivatives) and/or the illegal use of any controlled substances is strictly prohibited. Violation of this rule may result in the immediate and permanent discontinuation of any and all prescriptions being written by our  practice. The use of any illegal substances is prohibited. Adopted CDC guidelines & recommendations: Target dosing levels will be at or below 60 MME/day. Use of benzodiazepines** is not recommended. Urine Drug testing: Patients taking controlled substances will be required to provide a urine sample upon request. Do not void before coming to your medication management appointments. Hold emptying your bladder until you are admitted. The admitting nurse will inform you if a sample is required. Our practice reserves the right to call you at any time to provide a sample. Once receiving the call, you have 24 hours to comply with request. Not providing a sample upon request may result in termination of medication therapy.  Exceptions: There are only two exceptions to the rule of not receiving pain medications from other Healthcare Providers. Exception #1 (Emergencies): In the event of an emergency (i.e.: accident requiring emergency care), you are allowed to receive additional pain medication. However, you are responsible for: As soon as you are able, call our office [phone], at any time of the day or night, and leave a message stating your name, the date and nature of the emergency, and the name and dose of the medication prescribed. In the event that your call is answered by a member of our staff, make sure to document and save the date, time, and the name of the person that took your information.  Exception #2 (Planned Surgery): In the event that you are scheduled by another doctor or dentist to have any type of surgery or procedure, you are allowed (for a period no longer than 30 days), to receive additional pain medication, for the acute post-op pain. However, in this case, you are responsible for picking up a copy of our Post-op Pain Management for Surgeons handout, and giving it to your surgeon or dentist. This document is available at our office, and does not require an appointment to obtain it.  Simply go to our office during business hours (Monday-Thursday from 8:00 AM to 4:00 PM) (Friday 8:00 AM to 12:00 Noon) or if you have a scheduled appointment with us , prior to your surgery, and ask for it by name. In addition, you are responsible for: calling our office [phone], at any time of the day or night, and leaving a message stating your name, name of your surgeon, type of surgery, and date of procedure or surgery. Failure to comply with your responsibilities may result in termination of therapy involving the controlled substances.  Consequences:  Non-compliance with the above rules may result in permanent discontinuation of medication prescription therapy. All patients receiving any type of controlled substance is expected to comply with the above patient responsibilities. Not doing so may result in permanent discontinuation of medication prescription therapy. Medication Agreement Violation. Following the above rules, including your responsibilities will help you  in avoiding a Medication Agreement Violation ("Breaking your Pain Medication Contract").  *Opioid medications include: morphine , codeine, oxycodone , oxymorphone, hydrocodone , hydromorphone , meperidine, tramadol , tapentadol, buprenorphine, fentanyl , methadone. **Benzodiazepine medications include: diazepam  (Valium ), alprazolam (Xanax), clonazepam (Klonopine), lorazepam  (Ativan ), clorazepate (Tranxene), chlordiazepoxide (Librium), estazolam (Prosom), oxazepam (Serax), temazepam (Restoril), triazolam (Halcion) (Last updated: 12/18/2022) ______________________________________________________________________

## 2023-10-09 ENCOUNTER — Telehealth: Payer: Self-pay | Admitting: *Deleted

## 2023-10-09 NOTE — Telephone Encounter (Signed)
 Post procedure call; no questions or concerns and reports she is doing well.

## 2023-10-22 NOTE — Progress Notes (Signed)
 Subjective:  Patient ID: Hannah Maynard, female    DOB: 06-19-1965  Age: 58 y.o. MRN: [id number]  CC: Back Pain (lower)   Procedure: L5-S1 lumbar epidural steroid under fluoroscopic guidance with no sedation  HPI Hannah Maynard presents for reevaluation and continues to have complaints of bilateral lower extremity severe sciatica.  She last had an L3-4 epidural back in July and has had significant improvement but some recurrence of anterior leg pain right greater than left.  She feels like she is making progress with the series of epidurals and desires to proceed with a repeat injection today.  Otherwise she is in her usual state of health.  She takes her chronic opioid medications to help with diffuse osteoarthritic body pain secondary to failure with conservative therapy.  These continue to help.  No side effects with her medications are reported.  No change in lower extremity strength or function or bowel or bladder function is noted.  Outpatient Medications Prior to Visit  Medication Sig Dispense Refill   atorvastatin (LIPITOR) 10 MG tablet Take 10 mg by mouth daily.     baclofen  (LIORESAL ) 10 MG tablet TAKE 1 TABLET BY MOUTH TWICE DAILY 60 each 4   doxycycline (VIBRAMYCIN) 100 MG capsule Take 100 mg by mouth 2 (two) times daily.     FLUoxetine (PROZAC) 40 MG capsule Take 40 mg by mouth daily.     fluticasone (FLONASE) 50 MCG/ACT nasal spray Place 1 spray into both nostrils daily.     ibuprofen (ADVIL,MOTRIN) 200 MG tablet Take 200 mg by mouth every 6 (six) hours as needed.     montelukast (SINGULAIR) 10 MG tablet Take 10 mg by mouth at bedtime.     Nerve Stimulator (ZEWA TENS/EMS COMBO UNIT) DEVI by Does not apply route.     pregabalin  (LYRICA ) 75 MG capsule Take 2 capsules (150 mg total) by mouth 2 (two) times daily. 120 capsule 3   pregabalin  (LYRICA ) 75 MG capsule TAKE 2 CAPSULES BY MOUTH TWICE DAILY 120 capsule 3   pregabalin  (LYRICA ) 75 MG capsule TAKE 2 CAPSULES BY MOUTH TWICE  DAILY 120 capsule 3   PROAIR  HFA 108 (90 Base) MCG/ACT inhaler Inhale 2 puffs into the lungs as needed.     sertraline  (ZOLOFT ) 100 MG tablet Take 200 mg by mouth at bedtime.      SYMBICORT 80-4.5 MCG/ACT inhaler Inhale 2 puffs into the lungs 2 (two) times daily.     traZODone  (DESYREL ) 50 MG tablet Take 50 mg by mouth at bedtime.     triamcinolone  cream (KENALOG ) 0.1 % Apply 1 application topically 3 (three) times daily as needed.     Oxycodone  HCl 10 MG TABS Take 1 tablet (10 mg total) by mouth every 4 (four) hours. 135 tablet 0   Facility-Administered Medications Prior to Visit  Medication Dose Route Frequency Provider Last Rate Last Admin   lidocaine  (PF) (XYLOCAINE ) 1 % injection 5 mL  5 mL Subcutaneous Once [name], MD       ropivacaine  (PF) 2 mg/mL (0.2%) (NAROPIN ) injection 10 mL  10 mL Epidural Once [name], MD        Review of Systems CNS: No confusion or sedation Cardiac: No angina or palpitations GI: No abdominal pain or constipation Constitutional: No nausea vomiting fevers or chills  Objective:  BP (!) 161/110   Pulse 81   Temp (!) 97.2 F (36.2 C) (Temporal)   Resp 15   Ht 5' 5 (1.651  m)   Wt 245 lb (111.1 kg)   LMP  (LMP Unknown)   SpO2 98%   BMI 40.77 kg/m    BP Readings from Last 3 Encounters:  10/08/23 (!) 161/110  06/30/23 (!) 143/93  04/01/23 (!) 151/86     Wt Readings from Last 3 Encounters:  10/08/23 245 lb (111.1 kg)  06/30/23 249 lb 4.8 oz (113.1 kg)  04/01/23 246 lb (111.6 kg)     Physical Exam Pt is alert and oriented PERRL EOMI HEART IS RRR no murmur or rub LCTA no wheezing or rales MUSCULOSKELETAL reveals some paraspinous muscle tenderness but no overt trigger points.  She walks with an antalgic gait and favors the right leg with a positive straight leg raise on the right side.  She feels that she is having less pain radiating into the right calf and anterior leg than prior to her last injection at the L3-4 level in  July.  Labs  No results found for: HGBA1C Lab Results  Component Value Date   CREATININE 0.95 05/22/2021    -------------------------------------------------------------------------------------------------------------------- Lab Results  Component Value Date   WBC 5.3 05/22/2021   HGB 13.9 05/22/2021   HCT 43.0 05/22/2021   PLT 176 05/22/2021   GLUCOSE 132 (H) 05/22/2021   ALT 21 05/21/2021   AST 27 05/21/2021   NA 140 05/22/2021   K 4.1 05/22/2021   CL 109 05/22/2021   CREATININE 0.95 05/22/2021   BUN 16 05/22/2021   CO2 24 05/22/2021    --------------------------------------------------------------------------------------------------------------------- DG PAIN CLINIC C-ARM 1-60 MIN NO REPORT Result Date: 10/08/2023 Fluoro was used, but no Radiologist interpretation will be provided. Please refer to NOTES tab for provider progress note.    Assessment & Plan:   Evita was seen today for back pain.  Diagnoses and all orders for this visit:  Chronic pain syndrome  Chronic bilateral low back pain with bilateral sciatica -     Lumbar Epidural Injection  Spinal stenosis of lumbar region with neurogenic claudication -     Lumbar Epidural Injection -     triamcinolone  acetonide (KENALOG -40) injection 40 mg -     sodium chloride  flush (NS) 0.9 % injection 10 mL -     ropivacaine  (PF) 2 mg/mL (0.2%) (NAROPIN ) injection 10 mL -     lidocaine  (PF) (XYLOCAINE ) 1 % injection 5 mL -     iohexol  (OMNIPAQUE ) 180 MG/ML injection 10 mL  Lumbar spondylosis -     Lumbar Epidural Injection -     triamcinolone  acetonide (KENALOG -40) injection 40 mg -     sodium chloride  flush (NS) 0.9 % injection 10 mL -     ropivacaine  (PF) 2 mg/mL (0.2%) (NAROPIN ) injection 10 mL -     lidocaine  (PF) (XYLOCAINE ) 1 % injection 5 mL -     iohexol  (OMNIPAQUE ) 180 MG/ML injection 10 mL  Cervicalgia  Degeneration of intervertebral disc of lumbar region with discogenic back pain  Facet  arthritis of lumbar region  Right sided sciatica  Chronic neck pain  Long term current use of opiate analgesic  Other orders -     Oxycodone  HCl 10 MG TABS; Take 1 tablet (10 mg total) by mouth every 4 (four) hours. -     Oxycodone  HCl 10 MG TABS; Take 1 tablet (10 mg total) by mouth every 4 (four) hours.        ----------------------------------------------------------------------------------------------------------------------  Problem List Items Addressed This Visit       Unprioritized   Chronic  low back pain (Chronic)   Relevant Medications   Oxycodone  HCl 10 MG TABS   Oxycodone  HCl 10 MG TABS (Start on 11/11/2023)   Cervicalgia   Chronic neck pain   Relevant Medications   Oxycodone  HCl 10 MG TABS   Oxycodone  HCl 10 MG TABS (Start on 11/11/2023)   Chronic pain syndrome - Primary   Relevant Medications   Oxycodone  HCl 10 MG TABS   Oxycodone  HCl 10 MG TABS (Start on 11/11/2023)   Long term current use of opiate analgesic   Lumbar spondylosis   Other Visit Diagnoses       Spinal stenosis of lumbar region with neurogenic claudication       Relevant Medications   Oxycodone  HCl 10 MG TABS   Oxycodone  HCl 10 MG TABS (Start on 11/11/2023)   triamcinolone  acetonide (KENALOG -40) injection 40 mg (Completed)   sodium chloride  flush (NS) 0.9 % injection 10 mL (Completed)   ropivacaine  (PF) 2 mg/mL (0.2%) (NAROPIN ) injection 10 mL (Completed)   lidocaine  (PF) (XYLOCAINE ) 1 % injection 5 mL (Completed)   iohexol  (OMNIPAQUE ) 180 MG/ML injection 10 mL (Completed)     Degeneration of intervertebral disc of lumbar region with discogenic back pain       Relevant Medications   Oxycodone  HCl 10 MG TABS   Oxycodone  HCl 10 MG TABS (Start on 11/11/2023)   triamcinolone  acetonide (KENALOG -40) injection 40 mg (Completed)     Facet arthritis of lumbar region       Relevant Medications   Oxycodone  HCl 10 MG TABS   Oxycodone  HCl 10 MG TABS (Start on 11/11/2023)   triamcinolone  acetonide  (KENALOG -40) injection 40 mg (Completed)     Right sided sciatica             ----------------------------------------------------------------------------------------------------------------------  1. Chronic bilateral low back pain with bilateral sciatica Will proceed with a repeat injection today at the L5-S1 level secondary to the posterior calf pain.  I feel like she is making good progress and I have encouraged her to continue efforts at weight loss stretching strengthening and core strengthening.  Will schedule her for 1 month return and we have gone over the risks and benefits of the procedure with her in full detail. - Lumbar Epidural Injection  2. Spinal stenosis of lumbar region with neurogenic claudication As above - Lumbar Epidural Injection - triamcinolone  acetonide (KENALOG -40) injection 40 mg - sodium chloride  flush (NS) 0.9 % injection 10 mL - ropivacaine  (PF) 2 mg/mL (0.2%) (NAROPIN ) injection 10 mL - lidocaine  (PF) (XYLOCAINE ) 1 % injection 5 mL - iohexol  (OMNIPAQUE ) 180 MG/ML injection 10 mL  3. Lumbar spondylosis As above - Lumbar Epidural Injection - triamcinolone  acetonide (KENALOG -40) injection 40 mg - sodium chloride  flush (NS) 0.9 % injection 10 mL - ropivacaine  (PF) 2 mg/mL (0.2%) (NAROPIN ) injection 10 mL - lidocaine  (PF) (XYLOCAINE ) 1 % injection 5 mL - iohexol  (OMNIPAQUE ) 180 MG/ML injection 10 mL  4. Chronic pain syndrome (Primary) I have reviewed the [place]  practitioner database information as appropriate to keep her on her current medication regimen.  Refills were generated for August 17 and September 16.  No other changes are noted.  She is aware of the risks and benefits of chronic opioid management.  5. Cervicalgia As above  6. Degeneration of intervertebral disc of lumbar region with discogenic back pain   7. Facet arthritis of lumbar region   8. Right sided sciatica As above  9. Chronic neck pain   10. Long term current  use of opiate analgesic     ----------------------------------------------------------------------------------------------------------------------  I am having Leontyne R. Woodrow start on Oxycodone  HCl. I am also having her maintain her ibuprofen, [place] TENS/EMS Combo Unit, ProAir  HFA, montelukast, sertraline , Symbicort, atorvastatin, fluticasone, traZODone , triamcinolone  cream, pregabalin , FLUoxetine, baclofen , pregabalin , pregabalin , doxycycline, and Oxycodone  HCl. We administered triamcinolone  acetonide, sodium chloride  flush, ropivacaine  (PF) 2 mg/mL (0.2%), lidocaine  (PF), and iohexol . We will continue to administer ropivacaine  (PF) 2 mg/mL (0.2%) and lidocaine  (PF).   Meds ordered this encounter  Medications   Oxycodone  HCl 10 MG TABS    Sig: Take 1 tablet (10 mg total) by mouth every 4 (four) hours.    Dispense:  135 tablet    Refill:  0   Oxycodone  HCl 10 MG TABS    Sig: Take 1 tablet (10 mg total) by mouth every 4 (four) hours.    Dispense:  135 tablet    Refill:  0   triamcinolone  acetonide (KENALOG -40) injection 40 mg   sodium chloride  flush (NS) 0.9 % injection 10 mL   ropivacaine  (PF) 2 mg/mL (0.2%) (NAROPIN ) injection 10 mL   lidocaine  (PF) (XYLOCAINE ) 1 % injection 5 mL   iohexol  (OMNIPAQUE ) 180 MG/ML injection 10 mL   Patient's Medications  New Prescriptions   OXYCODONE  HCL 10 MG TABS    Take 1 tablet (10 mg total) by mouth every 4 (four) hours.  Previous Medications   ATORVASTATIN (LIPITOR) 10 MG TABLET    Take 10 mg by mouth daily.   BACLOFEN  (LIORESAL ) 10 MG TABLET    TAKE 1 TABLET BY MOUTH TWICE DAILY   DOXYCYCLINE (VIBRAMYCIN) 100 MG CAPSULE    Take 100 mg by mouth 2 (two) times daily.   FLUOXETINE (PROZAC) 40 MG CAPSULE    Take 40 mg by mouth daily.   FLUTICASONE (FLONASE) 50 MCG/ACT NASAL SPRAY    Place 1 spray into both nostrils daily.   IBUPROFEN (ADVIL,MOTRIN) 200 MG TABLET    Take 200 mg by mouth every 6 (six) hours as needed.   MONTELUKAST (SINGULAIR) 10  MG TABLET    Take 10 mg by mouth at bedtime.   NERVE STIMULATOR (ZEWA TENS/EMS COMBO UNIT) DEVI    by Does not apply route.   PREGABALIN  (LYRICA ) 75 MG CAPSULE    Take 2 capsules (150 mg total) by mouth 2 (two) times daily.   PREGABALIN  (LYRICA ) 75 MG CAPSULE    TAKE 2 CAPSULES BY MOUTH TWICE DAILY   PREGABALIN  (LYRICA ) 75 MG CAPSULE    TAKE 2 CAPSULES BY MOUTH TWICE DAILY   PROAIR  HFA 108 (90 BASE) MCG/ACT INHALER    Inhale 2 puffs into the lungs as needed.   SERTRALINE  (ZOLOFT ) 100 MG TABLET    Take 200 mg by mouth at bedtime.    SYMBICORT 80-4.5 MCG/ACT INHALER    Inhale 2 puffs into the lungs 2 (two) times daily.   TRAZODONE  (DESYREL ) 50 MG TABLET    Take 50 mg by mouth at bedtime.   TRIAMCINOLONE  CREAM (KENALOG ) 0.1 %    Apply 1 application topically 3 (three) times daily as needed.  Modified Medications   Modified Medication Previous Medication   OXYCODONE  HCL 10 MG TABS Oxycodone  HCl 10 MG TABS      Take 1 tablet (10 mg total) by mouth every 4 (four) hours.    Take 1 tablet (10 mg total) by mouth every 4 (four) hours.  Discontinued Medications   No medications on file   ----------------------------------------------------------------------------------------------------------------------  Follow-up:  Return in about 1 month (around 11/08/2023) for evaluation, med refill.   Procedure: L5-S1 LESI with fluoroscopic guidance and without moderate sedation  NOTE: The risks, benefits, and expectations of the procedure have been discussed and explained to the patient who was understanding and in agreement with suggested treatment plan. No guarantees were made.  DESCRIPTION OF PROCEDURE: Lumbar epidural steroid injection with no IV Versed , EKG, blood pressure, pulse, and pulse oximetry monitoring. The procedure was performed with the patient in the prone position under fluoroscopic guidance.  Sterile prep x3 was initiated and I then injected subcutaneous lidocaine  to the overlying L5-S1 site  after its fluoroscopic identifictation.  Using strict aseptic technique, I then advanced an 18-gauge Tuohy epidural needle in the midline using interlaminar approach via loss-of-resistance to saline technique. There was negative aspiration for heme or  CSF.  I then confirmed position with both AP and Lateral fluoroscan.  2 cc of contrast dye were injected and a  total of 5 mL of Preservative-Free normal saline mixed with 40 mg of Kenalog  and 1cc Ropicaine 0.2 percent were injected incrementally via the  epidurally placed needle. The needle was removed. The patient tolerated the injection well and was convalesced and discharged to home in stable condition. Should the patient have any post procedure difficulty they have been instructed on how to contact us  for assistance.   Lynwood KANDICE Clause, MD

## 2023-11-05 ENCOUNTER — Encounter: Payer: Self-pay | Admitting: Anesthesiology

## 2023-11-05 ENCOUNTER — Ambulatory Visit: Attending: Anesthesiology | Admitting: Anesthesiology

## 2023-11-05 DIAGNOSIS — M47816 Spondylosis without myelopathy or radiculopathy, lumbar region: Secondary | ICD-10-CM

## 2023-11-05 DIAGNOSIS — M5442 Lumbago with sciatica, left side: Secondary | ICD-10-CM

## 2023-11-05 DIAGNOSIS — M5431 Sciatica, right side: Secondary | ICD-10-CM

## 2023-11-05 DIAGNOSIS — G8929 Other chronic pain: Secondary | ICD-10-CM

## 2023-11-05 DIAGNOSIS — M5441 Lumbago with sciatica, right side: Secondary | ICD-10-CM

## 2023-11-05 DIAGNOSIS — Z79891 Long term (current) use of opiate analgesic: Secondary | ICD-10-CM

## 2023-11-05 DIAGNOSIS — M48062 Spinal stenosis, lumbar region with neurogenic claudication: Secondary | ICD-10-CM

## 2023-11-05 DIAGNOSIS — G894 Chronic pain syndrome: Secondary | ICD-10-CM

## 2023-11-05 DIAGNOSIS — M542 Cervicalgia: Secondary | ICD-10-CM

## 2023-11-05 DIAGNOSIS — M5136 Other intervertebral disc degeneration, lumbar region with discogenic back pain only: Secondary | ICD-10-CM

## 2023-11-05 MED ORDER — OXYCODONE HCL 10 MG PO TABS: 10.0000 mg | ORAL_TABLET | ORAL | 0 refills | Status: AC

## 2023-11-05 MED ORDER — OXYCODONE HCL 10 MG PO TABS: 10.0000 mg | ORAL_TABLET | ORAL | 0 refills | Status: DC
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
## 2023-11-13 ENCOUNTER — Telehealth: Payer: Self-pay | Admitting: *Deleted

## 2023-11-13 NOTE — Telephone Encounter (Signed)
 PA was sent for oxycodone  10 mg on 11/10/23 and then an appeal was sent.  That was also completed and we received word that the patient did not have coverage.  I have called to [facility] to confirm coverage and in fact patient does have coverage.  An appeal was started over the phone with [employer] representative and I am waiting for further fax with instructions to be sent.  Patient is aware of issue.  She does have medication currently because she paid $$ for 11/10/23 fill.

## 2023-11-18 ENCOUNTER — Telehealth: Payer: Self-pay | Admitting: Anesthesiology

## 2023-11-18 ENCOUNTER — Telehealth: Payer: Self-pay

## 2023-11-18 NOTE — Telephone Encounter (Signed)
 Her insurance denied auth for her meds. They sent her an appeal form but she needs to help filling it out. She will bring it up here for a nurse to help her with it.

## 2023-11-18 NOTE — Telephone Encounter (Signed)
 Patient brought in denial papers from her insurance for her medications. I spoke with Kori about this. They are asking for notes from the physician as well as labs and appeal papers filled out. I gave papers to [facility].

## 2023-11-19 NOTE — Telephone Encounter (Signed)
 Hannah Maynard has done 2 appeals and faxed notes to [employer].  Makaylee notified. She will call them to see what the status is.

## 2023-11-24 ENCOUNTER — Telehealth: Payer: Self-pay

## 2023-11-24 NOTE — Telephone Encounter (Signed)
 Insurance company is working on appeal for oxycode and has questions please call [phone].

## 2023-12-31 ENCOUNTER — Encounter: Admitting: Anesthesiology

## 2024-01-01 ENCOUNTER — Encounter: Payer: Self-pay | Admitting: Anesthesiology

## 2024-01-01 ENCOUNTER — Ambulatory Visit: Attending: Anesthesiology | Admitting: Anesthesiology

## 2024-01-01 DIAGNOSIS — M48062 Spinal stenosis, lumbar region with neurogenic claudication: Secondary | ICD-10-CM

## 2024-01-01 DIAGNOSIS — G8929 Other chronic pain: Secondary | ICD-10-CM

## 2024-01-01 DIAGNOSIS — M5442 Lumbago with sciatica, left side: Secondary | ICD-10-CM | POA: Diagnosis not present

## 2024-01-01 DIAGNOSIS — M5441 Lumbago with sciatica, right side: Secondary | ICD-10-CM | POA: Diagnosis not present

## 2024-01-01 DIAGNOSIS — M47816 Spondylosis without myelopathy or radiculopathy, lumbar region: Secondary | ICD-10-CM

## 2024-01-01 DIAGNOSIS — M5136 Other intervertebral disc degeneration, lumbar region with discogenic back pain only: Secondary | ICD-10-CM

## 2024-01-01 DIAGNOSIS — M5431 Sciatica, right side: Secondary | ICD-10-CM

## 2024-01-01 DIAGNOSIS — M542 Cervicalgia: Secondary | ICD-10-CM

## 2024-01-01 MED ORDER — METHOCARBAMOL 750 MG PO TABS: 750.0000 mg | ORAL_TABLET | Freq: Three times a day (TID) | ORAL | 3 refills | Status: AC | PRN

## 2024-01-01 MED ORDER — OXYCODONE HCL 10 MG PO TABS: 10.0000 mg | ORAL_TABLET | ORAL | 0 refills | Status: DC

## 2024-01-01 MED ORDER — OXYCODONE HCL 10 MG PO TABS: 10.0000 mg | ORAL_TABLET | ORAL | 0 refills | Status: AC
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
## 2024-01-05 ENCOUNTER — Telehealth: Payer: Self-pay

## 2024-01-06 NOTE — Telephone Encounter (Signed)
 I need for you to write a letter of medical necessity for her lesi. Her insurance is trying to deny it. Add the percentage of relief from her last one and why you think its necessary to continue doing them. Also put in there her pain score is a 7. Thanks.

## 2024-01-08 ENCOUNTER — Telehealth: Payer: Self-pay

## 2024-01-08 NOTE — Telephone Encounter (Signed)
 Per the 01/01/24 notes: Patient had previous Lumbar epidural on  10/08/23. Her pain score was a 9 before the procedure which dropped down to 2 after procedure,  and she got 80% relief that lasted 3 months, with increased ability to perform her activities of daily living. Current pain score is at a 7 now and patient wished to repeat lumbar epidural.

## 2024-02-11 ENCOUNTER — Other Ambulatory Visit: Payer: Self-pay | Admitting: Anesthesiology

## 2024-02-11 ENCOUNTER — Ambulatory Visit: Admitting: Anesthesiology

## 2024-02-11 ENCOUNTER — Encounter: Payer: Self-pay | Admitting: Anesthesiology

## 2024-02-11 ENCOUNTER — Ambulatory Visit
Admission: RE | Admit: 2024-02-11 | Discharge: 2024-02-11 | Disposition: A | Discharge status: Home / Self Care | Source: Ambulatory Visit | Attending: Anesthesiology | Admitting: Anesthesiology

## 2024-02-11 VITALS — BP 166/98 | HR 94 | Temp 98.0°F | Resp 17 | Ht 64.0 in | Wt 238.0 lb

## 2024-02-11 DIAGNOSIS — G8929 Other chronic pain: Secondary | ICD-10-CM | POA: Diagnosis present

## 2024-02-11 DIAGNOSIS — M542 Cervicalgia: Secondary | ICD-10-CM | POA: Diagnosis present

## 2024-02-11 DIAGNOSIS — M48062 Spinal stenosis, lumbar region with neurogenic claudication: Secondary | ICD-10-CM

## 2024-02-11 DIAGNOSIS — M5441 Lumbago with sciatica, right side: Secondary | ICD-10-CM | POA: Insufficient documentation

## 2024-02-11 DIAGNOSIS — G894 Chronic pain syndrome: Secondary | ICD-10-CM

## 2024-02-11 DIAGNOSIS — M47816 Spondylosis without myelopathy or radiculopathy, lumbar region: Secondary | ICD-10-CM

## 2024-02-11 DIAGNOSIS — M5442 Lumbago with sciatica, left side: Secondary | ICD-10-CM | POA: Diagnosis present

## 2024-02-11 DIAGNOSIS — M5431 Sciatica, right side: Secondary | ICD-10-CM

## 2024-02-11 DIAGNOSIS — M5136 Other intervertebral disc degeneration, lumbar region with discogenic back pain only: Secondary | ICD-10-CM | POA: Diagnosis present

## 2024-02-11 MED ORDER — LIDOCAINE HCL (PF) 1 % IJ SOLN
5.0000 mL | Freq: Once | INTRAMUSCULAR | Status: AC
  Administered 2024-02-11: 10:00:00 5 mL via SUBCUTANEOUS

## 2024-02-11 MED ORDER — IOHEXOL 180 MG/ML  SOLN
INTRAMUSCULAR | Status: AC
  Filled 2024-02-11: qty 20

## 2024-02-11 MED ORDER — TRIAMCINOLONE ACETONIDE 40 MG/ML IJ SUSP
40.0000 mg | Freq: Once | INTRAMUSCULAR | Status: AC
  Administered 2024-02-11: 10:00:00 40 mg

## 2024-02-11 MED ORDER — OXYCODONE HCL 10 MG PO TABS: 10.0000 mg | ORAL_TABLET | ORAL | 0 refills | Status: AC

## 2024-02-11 MED ORDER — ROPIVACAINE HCL 2 MG/ML IJ SOLN
10.0000 mL | Freq: Once | INTRAMUSCULAR | Status: AC
  Administered 2024-02-11: 10:00:00 10 mL via EPIDURAL

## 2024-02-11 MED ORDER — SODIUM CHLORIDE (PF) 0.9 % IJ SOLN
INTRAMUSCULAR | Status: AC
  Filled 2024-02-11: qty 10

## 2024-02-11 MED ORDER — SODIUM CHLORIDE 0.9% FLUSH
10.0000 mL | Freq: Once | INTRAVENOUS | Status: AC
  Administered 2024-02-11: 10:00:00 10 mL

## 2024-02-11 MED ORDER — ROPIVACAINE HCL 2 MG/ML IJ SOLN
INTRAMUSCULAR | Status: AC
  Filled 2024-02-11: qty 20

## 2024-02-11 MED ORDER — OXYCODONE HCL 10 MG PO TABS: 10.0000 mg | ORAL_TABLET | ORAL | 0 refills | Status: AC | PRN

## 2024-02-11 MED ORDER — IOHEXOL 180 MG/ML  SOLN
10.0000 mL | Freq: Once | INTRAMUSCULAR | Status: AC | PRN
  Administered 2024-02-11: 10:00:00 10 mL via EPIDURAL

## 2024-02-11 MED ORDER — LIDOCAINE HCL (PF) 1 % IJ SOLN
INTRAMUSCULAR | Status: AC
  Filled 2024-02-11: qty 5

## 2024-02-11 MED FILL — Triamcinolone Acetonide Inj Susp 40 MG/ML: INTRAMUSCULAR | Qty: 1 | Status: AC

## 2024-02-11 NOTE — Patient Instructions (Signed)

## 2024-02-11 NOTE — Progress Notes (Unsigned)
 Safety precautions to be maintained throughout the outpatient stay will include: orient to surroundings, keep bed in low position, maintain call bell within reach at all times, provide assistance with transfer out of bed and ambulation.

## 2024-02-12 NOTE — Progress Notes (Signed)
 Subjective:  Patient ID: Hannah Maynard, female    DOB: 01-Sep-1965  Age: 58 y.o. MRN: [id number]  CC: Back Pain (Lumbar bilateral ) and Leg Pain (Bilateral )   Procedure: L5-S1 epidural steroid injection with no sedation and fluoroscopic guidance  HPI [name] presents for reevaluation.  Hannah Maynard was last evaluated 2 months ago and has had increasing complaints of bilateral lower extremity sciatica symptoms.  She has ongoing degenerative disc disease with foraminal stenosis and some spinal stenosis and Hannah Maynard pain is aggravated recently with the worsening sciatica.  Hannah Maynard last injection was back in August.  At that point she had an L5-S1 epidural steroid injection that gave Hannah Maynard about 90% relief of Hannah Maynard sciatica symptoms for about 3 to 4 months.  She still has baseline ongoing low back pain and some cervical pain.  She takes Hannah Maynard medications including the chronic opioid therapy for that and these continue to work well for Hannah Maynard.  The oxycodone  10 mg tablets worked effectively to keep Hannah Maynard pain under control whereas she has failed more conservative therapy.  Unfortunately this pain has been persistent and recalcitrant with difficulty treating.  With the medications she maintains that she is able to stay active functional and continue efforts at weight loss which she is actively doing.  Otherwise she is in Hannah Maynard usual state of health.  No change in lower extremity strength function bowel or bladder function is noted.  Outpatient Medications Prior to Visit  Medication Sig Dispense Refill   atorvastatin (LIPITOR) 10 MG tablet Take 10 mg by mouth daily.     baclofen  (LIORESAL ) 10 MG tablet TAKE 1 TABLET BY MOUTH TWICE DAILY 60 each 4   doxycycline (VIBRAMYCIN) 100 MG capsule Take 100 mg by mouth 2 (two) times daily.     FLUoxetine (PROZAC) 40 MG capsule Take 40 mg by mouth daily.     fluticasone (FLONASE) 50 MCG/ACT nasal spray Place 1 spray into both nostrils daily.     ibuprofen (ADVIL,MOTRIN) 200 MG  tablet Take 200 mg by mouth every 6 (six) hours as needed.     montelukast (SINGULAIR) 10 MG tablet Take 10 mg by mouth at bedtime.     Nerve Stimulator (ZEWA TENS/EMS COMBO UNIT) DEVI by Does not apply route.     pregabalin  (LYRICA ) 75 MG capsule Take 2 capsules (150 mg total) by mouth 2 (two) times daily. 120 capsule 3   pregabalin  (LYRICA ) 75 MG capsule TAKE 2 CAPSULES BY MOUTH TWICE DAILY 120 capsule 3   pregabalin  (LYRICA ) 75 MG capsule TAKE 2 CAPSULES BY MOUTH TWICE DAILY 120 capsule 3   PROAIR  HFA 108 (90 Base) MCG/ACT inhaler Inhale 2 puffs into the lungs as needed.     sertraline  (ZOLOFT ) 100 MG tablet Take 200 mg by mouth at bedtime.      SYMBICORT 80-4.5 MCG/ACT inhaler Inhale 2 puffs into the lungs 2 (two) times daily.     traZODone  (DESYREL ) 50 MG tablet Take 50 mg by mouth at bedtime.     triamcinolone  cream (KENALOG ) 0.1 % Apply 1 application topically 3 (three) times daily as needed.     Oxycodone  HCl 10 MG TABS Take 1 tablet (10 mg total) by mouth every 4 (four) hours. 135 tablet 0   Facility-Administered Medications Prior to Visit  Medication Dose Route Frequency Provider Last Rate Last Admin   lidocaine  (PF) (XYLOCAINE ) 1 % injection 5 mL  5 mL Subcutaneous Once Naseem Varden G, MD  ropivacaine  (PF) 2 mg/mL (0.2%) (NAROPIN ) injection 10 mL  10 mL Epidural Once [name], MD        Review of Systems CNS: No confusion or sedation Cardiac: No angina or palpitations GI: No abdominal pain or constipation Constitutional: No nausea vomiting fevers or chills  Objective:  BP (!) 166/98   Pulse 94   Temp 98 F (36.7 C) (Temporal)   Resp 17   Ht 5' 4 (1.626 m)   Wt 238 lb (108 kg)   LMP  (LMP Unknown)   SpO2 95%   BMI 40.85 kg/m    BP Readings from Last 3 Encounters:  02/11/24 (!) 166/98  10/08/23 (!) 161/110  06/30/23 (!) 143/93     Wt Readings from Last 3 Encounters:  02/11/24 238 lb (108 kg)  10/08/23 245 lb (111.1 kg)  06/30/23 249 lb 4.8 oz  (113.1 kg)     Physical Exam Pt is alert and oriented PERRL EOMI HEART IS RRR no murmur or rub LCTA no wheezing or rales MUSCULOSKELETAL reveals some paraspinous muscle tenderness with a positive straight leg raise on the right side somewhat equivocal on the left.  Muscle tone and bulk is at baseline.  Labs  No results found for: HGBA1C Lab Results  Component Value Date   CREATININE 0.95 05/22/2021    -------------------------------------------------------------------------------------------------------------------- Lab Results  Component Value Date   WBC 5.3 05/22/2021   HGB 13.9 05/22/2021   HCT 43.0 05/22/2021   PLT 176 05/22/2021   GLUCOSE 132 (H) 05/22/2021   ALT 21 05/21/2021   AST 27 05/21/2021   NA 140 05/22/2021   K 4.1 05/22/2021   CL 109 05/22/2021   CREATININE 0.95 05/22/2021   BUN 16 05/22/2021   CO2 24 05/22/2021    --------------------------------------------------------------------------------------------------------------------- DG PAIN CLINIC C-ARM 1-60 MIN NO REPORT Result Date: 02/11/2024 Fluoro was used, but no Radiologist interpretation will be provided. Please refer to NOTES tab for provider progress note.    Assessment & Plan:   Hannah Maynard was seen today for back pain and leg pain.  Diagnoses and all orders for this visit:  Chronic bilateral low back pain with bilateral sciatica  Spinal stenosis of lumbar region with neurogenic claudication -     Lumbar Epidural Injection  Right sided sciatica -     Lumbar Epidural Injection  Lumbar spondylosis  Degeneration of intervertebral disc of lumbar region with discogenic back pain  Cervicalgia  Facet arthritis of lumbar region  Chronic pain syndrome  Other orders -     Oxycodone  HCl 10 MG TABS; Take 1 tablet (10 mg total) by mouth every 4 (four) hours. -     Oxycodone  HCl 10 MG TABS; Take 1 tablet (10 mg total) by mouth every 4 (four) hours as needed. -     triamcinolone   acetonide (KENALOG -40) injection 40 mg -     sodium chloride  flush (NS) 0.9 % injection 10 mL -     ropivacaine  (PF) 2 mg/mL (0.2%) (NAROPIN ) injection 10 mL -     lidocaine  (PF) (XYLOCAINE ) 1 % injection 5 mL -     iohexol  (OMNIPAQUE ) 180 MG/ML injection 10 mL        ----------------------------------------------------------------------------------------------------------------------  Problem List Items Addressed This Visit       Unprioritized   Chronic low back pain - Primary (Chronic)   Relevant Medications   Oxycodone  HCl 10 MG TABS (Start on 03/08/2024)   Oxycodone  HCl 10 MG TABS (Start on 04/07/2024)   Cervicalgia  Chronic pain syndrome   Relevant Medications   Oxycodone  HCl 10 MG TABS (Start on 03/08/2024)   Oxycodone  HCl 10 MG TABS (Start on 04/07/2024)   Lumbar spondylosis   Other Visit Diagnoses       Spinal stenosis of lumbar region with neurogenic claudication       Relevant Medications   Oxycodone  HCl 10 MG TABS (Start on 03/08/2024)   Oxycodone  HCl 10 MG TABS (Start on 04/07/2024)   triamcinolone  acetonide (KENALOG -40) injection 40 mg (Completed)     Right sided sciatica         Degeneration of intervertebral disc of lumbar region with discogenic back pain       Relevant Medications   Oxycodone  HCl 10 MG TABS (Start on 03/08/2024)   Oxycodone  HCl 10 MG TABS (Start on 04/07/2024)   triamcinolone  acetonide (KENALOG -40) injection 40 mg (Completed)     Facet arthritis of lumbar region       Relevant Medications   Oxycodone  HCl 10 MG TABS (Start on 03/08/2024)   Oxycodone  HCl 10 MG TABS (Start on 04/07/2024)   triamcinolone  acetonide (KENALOG -40) injection 40 mg (Completed)         ----------------------------------------------------------------------------------------------------------------------  1. Spinal stenosis of lumbar region with neurogenic claudication Will proceed with a repeat epidural.  She does very well with these and generally gets about 90%  reduction in Hannah Maynard sciatica symptoms and about a 50 to 60% reduction in Hannah Maynard low back pain.  She still requires chronic opioid therapy however she has much better pain relief with Hannah Maynard opioid medications following the epidural steroid injections.  She has these about every 2 to 3 months on average.  I have encouraged Hannah Maynard to continue with core stretching strengthening exercises and efforts at weight loss as she is now doing. - Lumbar Epidural Injection  2. Right sided sciatica As above - Lumbar Epidural Injection  3. Chronic bilateral low back pain with bilateral sciatica (Primary) As above  4. Lumbar spondylosis   5. Degeneration of intervertebral disc of lumbar region with discogenic back pain   6. Cervicalgia   7. Facet arthritis of lumbar region   8. Chronic pain syndrome I have reviewed the [place]  practitioner database information and is appropriate for refill.  Refills will be generated for January 12 and February 11 with return to clinic in 58-month    ----------------------------------------------------------------------------------------------------------------------  I am having Clint R. Canady start on Oxycodone  HCl. I am also having Hannah Maynard maintain Hannah Maynard ibuprofen, [place] TENS/EMS Combo Unit, ProAir  HFA, montelukast, sertraline , Symbicort, atorvastatin, fluticasone, traZODone , triamcinolone  cream, pregabalin , FLUoxetine, baclofen , pregabalin , pregabalin , doxycycline, and Oxycodone  HCl. We administered triamcinolone  acetonide, sodium chloride  flush, ropivacaine  (PF) 2 mg/mL (0.2%), lidocaine  (PF), and iohexol . We will continue to administer ropivacaine  (PF) 2 mg/mL (0.2%) and lidocaine  (PF).   Meds ordered this encounter  Medications   Oxycodone  HCl 10 MG TABS    Sig: Take 1 tablet (10 mg total) by mouth every 4 (four) hours.    Dispense:  135 tablet    Refill:  0   Oxycodone  HCl 10 MG TABS    Sig: Take 1 tablet (10 mg total) by mouth every 4 (four) hours as needed.     Dispense:  135 tablet    Refill:  0   triamcinolone  acetonide (KENALOG -40) injection 40 mg   sodium chloride  flush (NS) 0.9 % injection 10 mL   ropivacaine  (PF) 2 mg/mL (0.2%) (NAROPIN ) injection 10 mL   lidocaine  (PF) (XYLOCAINE ) 1 % injection 5  mL   iohexol  (OMNIPAQUE ) 180 MG/ML injection 10 mL   Patient's Medications  New Prescriptions   OXYCODONE  HCL 10 MG TABS    Take 1 tablet (10 mg total) by mouth every 4 (four) hours as needed.  Previous Medications   ATORVASTATIN (LIPITOR) 10 MG TABLET    Take 10 mg by mouth daily.   BACLOFEN  (LIORESAL ) 10 MG TABLET    TAKE 1 TABLET BY MOUTH TWICE DAILY   DOXYCYCLINE (VIBRAMYCIN) 100 MG CAPSULE    Take 100 mg by mouth 2 (two) times daily.   FLUOXETINE (PROZAC) 40 MG CAPSULE    Take 40 mg by mouth daily.   FLUTICASONE (FLONASE) 50 MCG/ACT NASAL SPRAY    Place 1 spray into both nostrils daily.   IBUPROFEN (ADVIL,MOTRIN) 200 MG TABLET    Take 200 mg by mouth every 6 (six) hours as needed.   MONTELUKAST (SINGULAIR) 10 MG TABLET    Take 10 mg by mouth at bedtime.   NERVE STIMULATOR (ZEWA TENS/EMS COMBO UNIT) DEVI    by Does not apply route.   PREGABALIN  (LYRICA ) 75 MG CAPSULE    Take 2 capsules (150 mg total) by mouth 2 (two) times daily.   PREGABALIN  (LYRICA ) 75 MG CAPSULE    TAKE 2 CAPSULES BY MOUTH TWICE DAILY   PREGABALIN  (LYRICA ) 75 MG CAPSULE    TAKE 2 CAPSULES BY MOUTH TWICE DAILY   PROAIR  HFA 108 (90 BASE) MCG/ACT INHALER    Inhale 2 puffs into the lungs as needed.   SERTRALINE  (ZOLOFT ) 100 MG TABLET    Take 200 mg by mouth at bedtime.    SYMBICORT 80-4.5 MCG/ACT INHALER    Inhale 2 puffs into the lungs 2 (two) times daily.   TRAZODONE  (DESYREL ) 50 MG TABLET    Take 50 mg by mouth at bedtime.   TRIAMCINOLONE  CREAM (KENALOG ) 0.1 %    Apply 1 application topically 3 (three) times daily as needed.  Modified Medications   Modified Medication Previous Medication   OXYCODONE  HCL 10 MG TABS Oxycodone  HCl 10 MG TABS      Take 1 tablet (10 mg total)  by mouth every 4 (four) hours.    Take 1 tablet (10 mg total) by mouth every 4 (four) hours.  Discontinued Medications   No medications on file   ----------------------------------------------------------------------------------------------------------------------  Follow-up: Return in about 2 months (around 04/13/2024) for evaluation, med refill.   Procedure: L5-S1 LESI with fluoroscopic guidance and without moderate sedation  NOTE: The risks, benefits, and expectations of the procedure have been discussed and explained to the patient who was understanding and in agreement with suggested treatment plan. No guarantees were made.  DESCRIPTION OF PROCEDURE: Lumbar epidural steroid injection with no IV Versed , EKG, blood pressure, pulse, and pulse oximetry monitoring. The procedure was performed with the patient in the prone position under fluoroscopic guidance.  Sterile prep x3 was initiated and I then injected subcutaneous lidocaine  to the overlying L5-S1 site after its fluoroscopic identifictation.  Using strict aseptic technique, I then advanced an 18-gauge Tuohy epidural needle in the midline using interlaminar approach via loss-of-resistance to saline technique. There was negative aspiration for heme or  CSF.  I then confirmed position with both AP and Lateral fluoroscan.  0 cc of contrast dye were injected and a  total of 5 mL of Preservative-Free normal saline mixed with 40 mg of Kenalog  and 1cc Ropicaine 0.2 percent were injected incrementally via the  epidurally placed needle. The needle was removed.  The patient tolerated the injection well and was convalesced and discharged to home in stable condition. Should the patient have any post procedure difficulty they have been instructed on how to contact us  for assistance.   Lynwood KANDICE Clause, MD

## 2024-02-16 ENCOUNTER — Ambulatory Visit
Admission: RE | Admit: 2024-02-16 | Discharge: 2024-02-16 | Disposition: A | Source: Ambulatory Visit | Attending: Family Medicine | Admitting: Family Medicine

## 2024-02-16 ENCOUNTER — Ambulatory Visit
Admission: RE | Admit: 2024-02-16 | Discharge: 2024-02-16 | Disposition: A | Discharge status: Home / Self Care | Attending: Family Medicine | Admitting: Family Medicine

## 2024-02-16 ENCOUNTER — Other Ambulatory Visit: Payer: Self-pay | Admitting: Family Medicine

## 2024-02-16 DIAGNOSIS — J441 Chronic obstructive pulmonary disease with (acute) exacerbation: Secondary | ICD-10-CM

## 2024-02-16 DIAGNOSIS — R0989 Other specified symptoms and signs involving the circulatory and respiratory systems: Secondary | ICD-10-CM

## 2024-03-10 ENCOUNTER — Ambulatory Visit
Admission: RE | Admit: 2024-03-10 | Discharge: 2024-03-10 | Disposition: A | Discharge status: Home / Self Care | Source: Ambulatory Visit | Attending: Family Medicine | Admitting: Family Medicine

## 2024-03-10 DIAGNOSIS — Z1231 Encounter for screening mammogram for malignant neoplasm of breast: Secondary | ICD-10-CM | POA: Insufficient documentation

## 2024-03-12 ENCOUNTER — Other Ambulatory Visit: Payer: Self-pay | Admitting: *Deleted

## 2024-03-12 ENCOUNTER — Other Ambulatory Visit: Payer: Self-pay | Admitting: Family Medicine

## 2024-03-12 ENCOUNTER — Inpatient Hospital Stay
Admission: RE | Admit: 2024-03-12 | Discharge: 2024-03-12 | Disposition: A | Discharge status: Home / Self Care | Payer: Self-pay | Source: Ambulatory Visit | Attending: Family Medicine | Admitting: Family Medicine

## 2024-03-12 DIAGNOSIS — Z87891 Personal history of nicotine dependence: Secondary | ICD-10-CM

## 2024-03-12 DIAGNOSIS — Z1231 Encounter for screening mammogram for malignant neoplasm of breast: Secondary | ICD-10-CM

## 2024-03-12 DIAGNOSIS — Z122 Encounter for screening for malignant neoplasm of respiratory organs: Secondary | ICD-10-CM

## 2024-04-01 ENCOUNTER — Ambulatory Visit

## 2024-04-06 ENCOUNTER — Encounter: Admitting: Anesthesiology
